# Patient Record
Sex: Female | Born: 1935 | Race: White | Hispanic: No | Smoking: Former smoker
Health system: Southern US, Community
[De-identification: ages and names within clinical notes are randomized; demographics above are authoritative.]

## PROBLEM LIST (undated history)

## (undated) DIAGNOSIS — H3530 Unspecified macular degeneration: Secondary | ICD-10-CM

## (undated) DIAGNOSIS — G459 Transient cerebral ischemic attack, unspecified: Secondary | ICD-10-CM

## (undated) DIAGNOSIS — I10 Essential (primary) hypertension: Secondary | ICD-10-CM

## (undated) DIAGNOSIS — I739 Peripheral vascular disease, unspecified: Secondary | ICD-10-CM

## (undated) DIAGNOSIS — J309 Allergic rhinitis, unspecified: Secondary | ICD-10-CM

## (undated) DIAGNOSIS — K8020 Calculus of gallbladder without cholecystitis without obstruction: Secondary | ICD-10-CM

## (undated) DIAGNOSIS — I779 Disorder of arteries and arterioles, unspecified: Secondary | ICD-10-CM

## (undated) DIAGNOSIS — Z720 Tobacco use: Secondary | ICD-10-CM

## (undated) DIAGNOSIS — E7800 Pure hypercholesterolemia, unspecified: Secondary | ICD-10-CM

## (undated) DIAGNOSIS — F329 Major depressive disorder, single episode, unspecified: Secondary | ICD-10-CM

## (undated) DIAGNOSIS — K5900 Constipation, unspecified: Secondary | ICD-10-CM

## (undated) DIAGNOSIS — N390 Urinary tract infection, site not specified: Secondary | ICD-10-CM

## (undated) DIAGNOSIS — K219 Gastro-esophageal reflux disease without esophagitis: Secondary | ICD-10-CM

## (undated) DIAGNOSIS — F32A Depression, unspecified: Secondary | ICD-10-CM

## (undated) DIAGNOSIS — I2510 Atherosclerotic heart disease of native coronary artery without angina pectoris: Secondary | ICD-10-CM

## (undated) DIAGNOSIS — D649 Anemia, unspecified: Secondary | ICD-10-CM

## (undated) DIAGNOSIS — N183 Chronic kidney disease, stage 3 unspecified: Secondary | ICD-10-CM

## (undated) DIAGNOSIS — E119 Type 2 diabetes mellitus without complications: Secondary | ICD-10-CM

## (undated) DIAGNOSIS — R51 Headache: Secondary | ICD-10-CM

## (undated) DIAGNOSIS — Z9289 Personal history of other medical treatment: Secondary | ICD-10-CM

## (undated) DIAGNOSIS — I509 Heart failure, unspecified: Secondary | ICD-10-CM

## (undated) DIAGNOSIS — G909 Disorder of the autonomic nervous system, unspecified: Secondary | ICD-10-CM

## (undated) DIAGNOSIS — G8929 Other chronic pain: Secondary | ICD-10-CM

## (undated) DIAGNOSIS — F419 Anxiety disorder, unspecified: Secondary | ICD-10-CM

## (undated) DIAGNOSIS — B952 Enterococcus as the cause of diseases classified elsewhere: Secondary | ICD-10-CM

## (undated) DIAGNOSIS — E039 Hypothyroidism, unspecified: Secondary | ICD-10-CM

## (undated) DIAGNOSIS — R279 Unspecified lack of coordination: Secondary | ICD-10-CM

## (undated) DIAGNOSIS — J449 Chronic obstructive pulmonary disease, unspecified: Secondary | ICD-10-CM

## (undated) DIAGNOSIS — N12 Tubulo-interstitial nephritis, not specified as acute or chronic: Secondary | ICD-10-CM

## (undated) DIAGNOSIS — E860 Dehydration: Secondary | ICD-10-CM

## (undated) DIAGNOSIS — E1142 Type 2 diabetes mellitus with diabetic polyneuropathy: Secondary | ICD-10-CM

## (undated) HISTORY — DX: Peripheral vascular disease, unspecified: I73.9

## (undated) HISTORY — DX: Chronic kidney disease, stage 3 unspecified: N18.30

## (undated) HISTORY — DX: Enterococcus as the cause of diseases classified elsewhere: B95.2

## (undated) HISTORY — DX: Transient cerebral ischemic attack, unspecified: G45.9

## (undated) HISTORY — PX: SHOULDER OPEN ROTATOR CUFF REPAIR: SHX2407

## (undated) HISTORY — DX: Calculus of gallbladder without cholecystitis without obstruction: K80.20

## (undated) HISTORY — DX: Enterococcus as the cause of diseases classified elsewhere: N39.0

## (undated) HISTORY — DX: Disorder of arteries and arterioles, unspecified: I77.9

## (undated) HISTORY — DX: Chronic kidney disease, stage 3 (moderate): N18.3

## (undated) HISTORY — PX: BYPASS GRAFT: SHX909

## (undated) HISTORY — PX: CATARACT EXTRACTION W/ INTRAOCULAR LENS  IMPLANT, BILATERAL: SHX1307

## (undated) HISTORY — DX: Tobacco use: Z72.0

## (undated) HISTORY — DX: Personal history of other medical treatment: Z92.89

## (undated) HISTORY — DX: Urinary tract infection, site not specified: N39.0

---
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
## 1969-01-14 HISTORY — PX: DILATION AND CURETTAGE OF UTERUS: SHX78

## 1969-01-14 HISTORY — PX: ABDOMINAL HYSTERECTOMY: SHX81

## 1999-03-18 ENCOUNTER — Encounter: Payer: Self-pay | Admitting: Vascular Surgery

## 1999-03-19 ENCOUNTER — Ambulatory Visit: Admission: RE | Admit: 1999-03-19 | Discharge: 1999-03-19 | Payer: Self-pay | Admitting: Vascular Surgery

## 1999-03-24 ENCOUNTER — Encounter: Payer: Self-pay | Admitting: Vascular Surgery

## 1999-03-24 ENCOUNTER — Ambulatory Visit (HOSPITAL_COMMUNITY): Admission: RE | Admit: 1999-03-24 | Discharge: 1999-03-24 | Payer: Self-pay | Admitting: Vascular Surgery

## 1999-05-14 ENCOUNTER — Ambulatory Visit (HOSPITAL_COMMUNITY): Admission: RE | Admit: 1999-05-14 | Discharge: 1999-05-14 | Payer: Self-pay | Admitting: Thoracic Surgery

## 1999-05-14 ENCOUNTER — Encounter: Payer: Self-pay | Admitting: Thoracic Surgery

## 1999-05-27 ENCOUNTER — Encounter: Admission: RE | Admit: 1999-05-27 | Discharge: 1999-08-25 | Payer: Self-pay | Admitting: Anesthesiology

## 1999-09-15 ENCOUNTER — Encounter: Payer: Self-pay | Admitting: Thoracic Surgery

## 1999-09-15 ENCOUNTER — Encounter: Admission: RE | Admit: 1999-09-15 | Discharge: 1999-09-15 | Payer: Self-pay | Admitting: Thoracic Surgery

## 2002-05-13 ENCOUNTER — Encounter: Admission: RE | Admit: 2002-05-13 | Discharge: 2002-08-11 | Payer: Self-pay | Admitting: Orthopaedic Surgery

## 2002-08-28 ENCOUNTER — Other Ambulatory Visit: Admission: RE | Admit: 2002-08-28 | Discharge: 2002-08-28 | Payer: Self-pay | Admitting: Family Medicine

## 2002-08-28 ENCOUNTER — Encounter: Admission: RE | Admit: 2002-08-28 | Discharge: 2002-08-28 | Payer: Self-pay | Admitting: Family Medicine

## 2002-08-28 ENCOUNTER — Encounter: Payer: Self-pay | Admitting: Family Medicine

## 2003-05-17 HISTORY — PX: CYSTOSTOMY W/ BLADDER BIOPSY: SHX1431

## 2003-09-18 ENCOUNTER — Encounter: Admission: RE | Admit: 2003-09-18 | Discharge: 2003-09-18 | Payer: Self-pay | Admitting: Urology

## 2003-09-19 ENCOUNTER — Encounter (INDEPENDENT_AMBULATORY_CARE_PROVIDER_SITE_OTHER): Payer: Self-pay | Admitting: Specialist

## 2003-09-19 ENCOUNTER — Ambulatory Visit (HOSPITAL_COMMUNITY): Admission: RE | Admit: 2003-09-19 | Discharge: 2003-09-19 | Payer: Self-pay | Admitting: Urology

## 2003-09-19 ENCOUNTER — Ambulatory Visit (HOSPITAL_BASED_OUTPATIENT_CLINIC_OR_DEPARTMENT_OTHER): Admission: RE | Admit: 2003-09-19 | Discharge: 2003-09-19 | Payer: Self-pay | Admitting: Urology

## 2005-05-18 ENCOUNTER — Emergency Department (HOSPITAL_COMMUNITY): Admission: EM | Admit: 2005-05-18 | Discharge: 2005-05-18 | Payer: Self-pay | Admitting: *Deleted

## 2005-11-07 ENCOUNTER — Ambulatory Visit (HOSPITAL_COMMUNITY): Admission: RE | Admit: 2005-11-07 | Discharge: 2005-11-07 | Payer: Self-pay | Admitting: Orthopaedic Surgery

## 2005-11-08 ENCOUNTER — Ambulatory Visit: Payer: Self-pay | Admitting: Cardiovascular Disease

## 2005-11-23 ENCOUNTER — Ambulatory Visit: Payer: Self-pay

## 2005-11-23 ENCOUNTER — Encounter: Payer: Self-pay | Admitting: Cardiovascular Disease

## 2005-11-29 ENCOUNTER — Ambulatory Visit (HOSPITAL_COMMUNITY): Admission: RE | Admit: 2005-11-29 | Discharge: 2005-11-29 | Payer: Self-pay | Admitting: Cardiology

## 2005-11-29 ENCOUNTER — Ambulatory Visit: Payer: Self-pay | Admitting: Cardiology

## 2005-11-29 HISTORY — PX: CARDIAC CATHETERIZATION: SHX172

## 2005-12-09 ENCOUNTER — Ambulatory Visit: Payer: Self-pay | Admitting: Cardiovascular Disease

## 2006-01-02 ENCOUNTER — Inpatient Hospital Stay (HOSPITAL_COMMUNITY): Admission: RE | Admit: 2006-01-02 | Discharge: 2006-01-03 | Payer: Self-pay | Admitting: Orthopaedic Surgery

## 2006-01-02 HISTORY — PX: ANTERIOR CERVICAL DECOMP/DISCECTOMY FUSION: SHX1161

## 2006-03-31 ENCOUNTER — Ambulatory Visit: Payer: Self-pay | Admitting: Cardiovascular Disease

## 2006-08-15 ENCOUNTER — Encounter: Admission: RE | Admit: 2006-08-15 | Discharge: 2006-08-15 | Payer: Self-pay | Admitting: Orthopaedic Surgery

## 2006-09-01 ENCOUNTER — Ambulatory Visit: Payer: Self-pay | Admitting: Vascular Surgery

## 2007-01-10 ENCOUNTER — Inpatient Hospital Stay (HOSPITAL_COMMUNITY): Admission: RE | Admit: 2007-01-10 | Discharge: 2007-01-17 | Payer: Self-pay | Admitting: Neurosurgery

## 2007-01-28 ENCOUNTER — Inpatient Hospital Stay (HOSPITAL_COMMUNITY): Admission: EM | Admit: 2007-01-28 | Discharge: 2007-02-05 | Payer: Self-pay | Admitting: Emergency Medicine

## 2007-02-01 ENCOUNTER — Encounter (INDEPENDENT_AMBULATORY_CARE_PROVIDER_SITE_OTHER): Payer: Self-pay | Admitting: Internal Medicine

## 2007-02-03 ENCOUNTER — Ambulatory Visit: Payer: Self-pay | Admitting: Surgery

## 2007-02-05 ENCOUNTER — Ambulatory Visit: Payer: Self-pay | Admitting: Physical Medicine & Rehabilitation

## 2007-04-04 ENCOUNTER — Ambulatory Visit: Payer: Self-pay | Admitting: Cardiovascular Disease

## 2007-04-04 LAB — CONVERTED CEMR LAB
BUN: 15 mg/dL (ref 6–23)
Calcium: 9.3 mg/dL (ref 8.4–10.5)
Chloride: 104 meq/L (ref 96–112)
GFR calc Af Amer: 63 mL/min

## 2007-06-11 ENCOUNTER — Encounter: Admission: RE | Admit: 2007-06-11 | Discharge: 2007-07-30 | Payer: Self-pay | Admitting: Family Medicine

## 2007-08-31 ENCOUNTER — Ambulatory Visit: Payer: Self-pay | Admitting: Vascular Surgery

## 2008-01-14 ENCOUNTER — Ambulatory Visit: Payer: Self-pay | Admitting: Cardiovascular Disease

## 2008-02-15 ENCOUNTER — Ambulatory Visit: Payer: Self-pay | Admitting: Vascular Surgery

## 2008-10-17 ENCOUNTER — Encounter (INDEPENDENT_AMBULATORY_CARE_PROVIDER_SITE_OTHER): Payer: Self-pay | Admitting: *Deleted

## 2008-12-01 DIAGNOSIS — E039 Hypothyroidism, unspecified: Secondary | ICD-10-CM | POA: Insufficient documentation

## 2008-12-01 DIAGNOSIS — R1310 Dysphagia, unspecified: Secondary | ICD-10-CM | POA: Insufficient documentation

## 2008-12-01 DIAGNOSIS — J449 Chronic obstructive pulmonary disease, unspecified: Secondary | ICD-10-CM | POA: Insufficient documentation

## 2008-12-01 DIAGNOSIS — I2510 Atherosclerotic heart disease of native coronary artery without angina pectoris: Secondary | ICD-10-CM | POA: Insufficient documentation

## 2008-12-01 DIAGNOSIS — E7800 Pure hypercholesterolemia, unspecified: Secondary | ICD-10-CM | POA: Insufficient documentation

## 2008-12-01 DIAGNOSIS — I10 Essential (primary) hypertension: Secondary | ICD-10-CM | POA: Insufficient documentation

## 2008-12-01 DIAGNOSIS — R0602 Shortness of breath: Secondary | ICD-10-CM

## 2008-12-01 DIAGNOSIS — E119 Type 2 diabetes mellitus without complications: Secondary | ICD-10-CM | POA: Insufficient documentation

## 2008-12-01 DIAGNOSIS — R5381 Other malaise: Secondary | ICD-10-CM | POA: Insufficient documentation

## 2008-12-01 DIAGNOSIS — D649 Anemia, unspecified: Secondary | ICD-10-CM | POA: Insufficient documentation

## 2008-12-01 DIAGNOSIS — R609 Edema, unspecified: Secondary | ICD-10-CM

## 2008-12-01 DIAGNOSIS — R5383 Other fatigue: Secondary | ICD-10-CM

## 2010-09-28 NOTE — Op Note (Signed)
[name], [name]                ACCOUNT NO.:  [id number]   MEDICAL RECORD NO.:  [id number]          PATIENT TYPE:  INP   LOCATION:  3035                         FACILITY:  [facility]   PHYSICIAN:  Cristi Loron, M.D.DATE OF BIRTH:  1934/08/16   DATE OF PROCEDURE:  01/10/2007  DATE OF DISCHARGE:                               OPERATIVE REPORT   BRIEF HISTORY:  The patient is a 76 year old white female who has had a  C5-6 and C6-7 anterior cervical diskectomy fusion plating performed many  months ago by another physician.  The patient developed a  pseudoarthrosis and persistent neck pain.  She south a second opinion  and I discussed various treatment options with the patient including  surgery.  The patient has weighed the risks, benefits and alternatives,  to surgery, decided to proceed with a posterior cervical instrumentation  and fusion C5-6 and C6-7.   PREOPERATIVE DIAGNOSIS:  C5-6 and C6-7 pseudoarthrosis, cervicalgia,  cervical radiculopathy.   POSTOPERATIVE DIAGNOSIS:  C5-6 and C6-7 pseudoarthrosis, cervicalgia,  cervical radiculopathy.   PROCEDURE:  C5-6 and C6-7 posterior cervical fusion with bone  morphogenic protein and Vitoss bone graft extender and local morselized  autograft bone; C5-C7 posterior cervical instrumentation with axis  titanium screws and rods.   SURGEON:  Dr. Delma Officer.   ASSISTANT:  Dr. Coletta Memos.   ANESTHESIA:  General endotracheal.   ESTIMATED BLOOD LOSS:  100 mL.   SPECIMENS:  None.   DRAINS:  None.   COMPLICATIONS:  None.   DESCRIPTION OF PROCEDURE:  The patient was brought to the operating room  by anesthesia team, general endotracheal anesthesia was induced.  I then  applied the [place] three point head rest to the patient's calvarium.  The patient was then carefully turned to prone position on chest rolls.  Her neck was somewhat flexed, exposing the suboccipital and posterior  cervical region.  This region was then shaved  with the clippers and  prepared with Betadine scrub and Betadine solution and sterile drapes  were applied.  I then injected the area to be incised with Marcaine with  epinephrine solution.  I then used a scalpel to make a linear midline  incision over the cervicothoracic junction.  I used electrocautery to  perform bilateral subperiosteal dissection, exposing the spinous process  and lamina of C5, 6, 7 and 1.  We then obtained intraoperative  radiograph to confirm our location.  We inserted the [facility]  retractor for exposure.  We exposed the facets at C5-6 and 6-7, using  electrocautery.  We then located the center of the lateral mass at C5,  6, and 7.  We used awl to create a hole in approximately the center of  the lateral mass.  Then we drilled 14 mm holes aiming cephalad and  lateral direction at C5, 6, and 7 bilaterally.  We then placed 14 mm  polyaxial titanium screws in the lateral masses bilaterally at C5, 6 and  7.  We contemplated using fluoroscopy but it became clear to [place] upon  using plain x-rays that there was no way the fluoroscopy could  be able  to give [place] any useful information and because of the patient's body  habitus.  We then obtained an intraoperative radiograph with limited  visualization.  There appeared to be good position on instrumentation.  It certainly looked good in vivo.  We then cut rods and bent them to  appropriate lordosis.  We placed the rod in the unilateral screws and  secured it in place with the caps.  We did this bilaterally.  We  tightened the caps appropriately.  This completed the instrumentation  from C5 to C7.   We now turned  our attention to the arthrodesis.  We used a high speed  drill to decorticate the remainder of the lateral masses and the lamina  at C5, 6, and 7 as well as to remove the synovial lining posterior  cervical lining of the facets of C5-6 and C6-7.  We then laid a  combination of local morcellized autograft bone we  obtained during the  drilling process as well as Vitoss bone graft extender and collagen  soaked bone morphogenic protein sponges over the posterolateral  structures.  This completed the posterolateral arthrodesis.  We then  obtained hemostasis with bipolar cautery.  We removed the retractor and  then reapproximated the patient's cervical fascia with interrupted #1  Vicryl sutures, subcutaneous tissue with interrupted 2-0 Vicryl suture  and the skin with Steri-Strips and benzoin.  The wound was then coated  with bacitracin ointment, a sterile dressing was applied, the drapes  were removed.  The patient was subsequently returned to supine position,  where we removed the [place] three-point head rest from the calvarium.  She was subsequently extubated and transported to the post anesthesia  care unit in stable condition.  All sponge instrument and needle counts  correct at end this case.      Cristi Loron, M.D.  Electronically Signed     JDJ/MEDQ  D:  01/10/2007  T:  01/11/2007  Job:  [id number]

## 2010-09-28 NOTE — Discharge Summary (Signed)
[name], [name]                ACCOUNT NO.:  [id number]   MEDICAL RECORD NO.:  [id number]          PATIENT TYPE:  INP   LOCATION:  3035                         FACILITY:  [facility]   PHYSICIAN:  Cristi Loron, M.D.DATE OF BIRTH:  03/17/1935   DATE OF ADMISSION:  01/10/2007  DATE OF DISCHARGE:  01/17/2007                               DISCHARGE SUMMARY   BRIEF HISTORY:  The patient is a 76 year old white female who has had a  C5-6 and C6-7 anterior cervical discectomy, fusion, and plating in the  past by another physician.  The patient developed a pseudoarthrosis and  persistent neck pain.  She requested a second opinion.  I saw the  patient and discussed various treatment options with her including  surgery.  The patient is aware of the risks, benefits, and alternatives  to the surgery and decided to proceed with a posterior cervical  instrumentation and fusion at C5-6 and C6-7.   For further details of this admission, please refer to the history and  physical.   HOSPITAL COURSE:  Admitted the patient to [facility] on January 10, 2007.  On the day of admission, I performed a C5-6 and C6-7  posterior cervical fusion and instrumentation.  The surgery went well.  For full details of this operation, please refer to operative note.   POSTOPERATIVE COURSE:  The patient's postoperative course was as  follows, the patient did develop some urinary retention, which resolved.  The patient was somewhat slow to mobilize.  We had PT and OT see the  patient.  We also asked PMNR to see the patient.  They did not feel that  she was appropriate for inpatient rehabilitation.  There was some  discussion back and forth from the patient and her family whether or not  she need to go to a super skilled nursing facility.  I discussed this on  several occasions with the patient and she declined it and said she want  to go home.  By January 23, 2007, the patient was afebrile.  Her vital  signs were stable.  She was eating well and ambulating well, and  therefore, we discharged her home.   DISCHARGE INSTRUCTIONS:  This patient was given written discharge  instructions.  Instructed to follow up with me in 4 weeks.   DISCHARGE PRESCRIPTIONS:  1. Percocet 10/325 mg, number 50, one p.o. q.4h. p.r.n. for pain.  2. Valium 5 mg, number 50, one p.o. q.6h. p.r.n. muscle spasm.   FINAL DIAGNOSES:  1. C5-C and C6-7 pseudoarthrosis.  2. Cervicalgia.  3. Cervical  radiculopathy.   PROCEDURE PERFORMED:  C5-6 and C6-7 posterior cervical fusion with bone  morphogenetic protein and VITOSS bone center as well as local morselized  autograft bone; C5-C7 posterior cervical instrumentation with several  titanium pedicle screws and wires.      Cristi Loron, M.D.  Electronically Signed     JDJ/MEDQ  D:  02/28/2007  T:  03/01/2007  Job:  [id number]

## 2010-09-28 NOTE — Procedures (Signed)
BYPASS GRAFT EVALUATION   INDICATION:  Followup evaluation of right BPG.   HISTORY:  Diabetes:  No.  Cardiac:  CHF.  Hypertension:  Yes.  Smoking:  Yes, 4-5 cigarettes a day.  Previous Surgery:  Right fem-AK pop BPG with translocated saphenous vein  on 08/16/1995 and left iliac PTA and stent on 08/20/1992.   SINGLE LEVEL ARTERIAL EXAM                               RIGHT              LEFT  Brachial:                    172                170  Anterior tibial:             154                110  Posterior tibial:            160                104  Peroneal:  Ankle/brachial index:        0.93               0.65   PREVIOUS ABI:  Date: 09/01/2006  RIGHT:  0.96  LEFT:  0.69   LOWER EXTREMITY BYPASS GRAFT DUPLEX EXAM:   DUPLEX:  1. Patent CFA with elevated velocity noted.  2. Patent fem-pop BPG with velocities ranging from 77 cm/s to 130 cm/s      without evidence of stenosis.  3. Patent distal native artery.   IMPRESSION:  1. Essentially stable ABIs bilaterally.  2. Patent CFA with elevated velocity noted.  3. Patent fem-pop without evidence of stenosis.  4. Patent distal native artery.     ___________________________________________  Larina Earthly, M.D.   PB/MEDQ  D:  08/31/2007  T:  08/31/2007  Job:  [id number]

## 2010-09-28 NOTE — Discharge Summary (Signed)
[name], [name]                ACCOUNT NO.:  [id number]   MEDICAL RECORD NO.:  [id number]          PATIENT TYPE:  INP   LOCATION:  3743                         FACILITY:  [facility]   PHYSICIAN:  Michiel Cowboy, MDDATE OF BIRTH:  May 12, 1935   DATE OF ADMISSION:  01/28/2007  DATE OF DISCHARGE:  02/05/2007                               DISCHARGE SUMMARY   DISCHARGE DIAGNOSES:  1. Chronic obstructive pulmonary disease.  2. Pulmonary deconditioning with new O2 requirement up to 2 liters.  3. Coronary artery disease, medical management.  4. Hypertension.  5. Diabetes.  6. Hypothyroidism.  7. C5-6 and C6-7 diskectomy with pseudarthrosis status post separation      in August 2008.  8. Anemia.   </HOSPITAL COURSE  The patient is a 76 year old female with past medical history of recent  cervical spine surgery which was performed by Dr. Lovell Sheehan on January 10, 2007 as well as history of coronary artery disease, diabetes and  hypertension.  The patient presented on January 28, 2007, with  complaints of generalized fatigue and shortness of breath.  She also  reports that she was having trouble swallowing after her operation and  had some decrease in p.o. intake.  On the day of admission, the patient  woke up with shortness of breath.  Prior to this, she had been having  some nonproductive cough.  Of note, the patient has not been diagnosed  with COPD prior to this, but has been smoking all her life up to the day  of admission.   At the time of admission, the patient was evaluated, ruled out for MI  when placed on telemetry.  Two cardiac enzymes were obtained and were  negative.  The patient had undergone a 2D echo that showed EF of 55-60%  with no left ventricular regional wall abnormalities and no diastolic  dysfunction.  The patient also was evaluated for potential PE with CT  angiogram as well as lower extremity Dopplers, neither one of them  showing a clot.  Chest x-ray was  negative for pneumonia, but did show a  mild congestive heart failure with very small bilateral pleural  effusions.  The patient was noted to be somewhat deconditioned to oxygen  requirement as remained to be around 2 liters per hospital stay.  At  this point, the patient was also evaluated for COPD with PFTs that  showed obstructive picture.  The patient was started on  Albuterol/Atrovent nebulizers as well as Advair.  Thereafter, her  breathing has improved and her shortness of breath has markedly  improved.  At the time of discharge, the patient remains on 2 liters of  oxygen, but is breathing comfortably.  No wheezing.   The patient has history of coronary artery disease status post  catheterization 2007 showing diffuse coronary artery disease.  At this  point, she is on medical management including beta blocker, aspirin,  Statin and also had an ACE inhibitor, Lisinopril 5 mg p.o. once daily.  Of note, the patient would need her labs checked in about one week to  monitor her creatinine and  her potassium.   Hypertension, while in-house, will continue her home medications.  Will  also add Lisinopril.  See above.   Diabetes, while in-house, initially her NPH was held.  Per the patient,  she was taking 15 units of NPH twice a day.  We switched the patient  onto Lantus and titrated it up.  On discharge, we will discharge her on  25 units of Lantus once daily.   Pseudarthrosis of the cervical spine.  The patient is status post  separation on December 19, 2006, by Dr. Lovell Sheehan.  He performed the  separation.  Will continue Elavil and Diazepam as needed for pain.  The  patient has a followup with Dr. Lovell Sheehan next week.   Anemia.  During this hospitalization, it was noted the patient was  anemic.  Her NCD was in high 80s and hemoccult stool was negative.  This  is likely anemia of chronic disease, but would recommend further workup  and follow up as an outpatient.  Her H&H remained stable.   She does not  require transfusions and was completely hemodynamically stable while in-  house.   Hypothyroidism.  I will continue the patient Synthroid while in-house.   Of note, it was noted that the patient had minimal urinary protein while  she was in-house.  This is likely secondary to diabetes.  Will start the  patient on low dose Lisinopril 5 mg p.o. daily.   DISCHARGE INSTRUCTIONS:  The patient to have follow up with Dr. Donia Guiles in 1-2 weeks.  Patient to schedule that appointment.  During  this appointment, I would recommend rechecking her chemistry panel.   The patient to have follow up by Dr. Tressie Stalker phone number [phone], appointment is on September 26 at 8:20 a.m.  The patient is to be  discharged to [facility] [place].  Patient to have daily PT/OT  as tolerated.   DISCHARGE MEDICATIONS:  1. Coreg 40 mg p.o. daily.  2. Norvasc 10 mg p.o. daily.  3. Crestor 20 mg p.o. daily.  4. Synthroid 50 mcg p.o. daily.  5. Elavil 25 mg p.o. at bedtime.  6. Prilosec 25 mg p.o. daily.  7. Aspirin 81 mg p.o. daily.  8. Lasix 20 mg p.o. daily.  9. Colace 100 mg p.o. b.i.d.  10.Dulcolax 10 mg suppositories p.r. every other day.  11.MiraLax 17 g daily p.r.n. constipation.  12.Advair 100/50 mg one puff b.i.d.  Wash mouth out with water after      applying.  13.Lantus 45 units subcu q.a.m.  14.Darvocet 100/150 mg p.o. q.6 h p.r.n. pain.  15.Diazepam 5 mg p.o. b.i.d. p.r.n. muscle spasm.  16.Lisinopril 5 mg p.o. daily.  17.Atrovent two puffs t.i.d.  18.Albuterol one puff q.4 h p.r.n. wheezing as well as Atrovent two      puffs q.4 h p.r.n. wheezing.  The patient needs to have her sugars      checked prior to meals and before bedtime.  19.Patient to have sliding scale insulin with NovoLog.   PHYSICAL EXAMINATION:  GENERAL:  On the day of discharge, the patient  appeared well.  LUNGS:  Clear.  No wheezing noted.  HEART:  Regular rate and rhythm.  No  murmurs, rubs or gallops.  EXTREMITIES:  Without edema.   LABORATORY DATA:  H&H 10.2/31.3, potassium 4.8, creatinine 0.9, albumin  2.4.      Michiel Cowboy, MD  Electronically Signed     AVD/MEDQ  D:  02/05/2007  T:  02/05/2007  Job:  [id number]   cc:   Donia Guiles, M.D.

## 2010-09-28 NOTE — Procedures (Signed)
BYPASS GRAFT EVALUATION   INDICATION:  Follow up right lower extremity bypass graft.   HISTORY:  Diabetes:  No.  Cardiac:  CHF.  Hypertension:  Yes.  Smoking:  Yes.  Previous Surgery:  Right fem-pop bypass graft on 08/16/95.  Left iliac  PTA and stent on 08/20/92.   SINGLE LEVEL ARTERIAL EXAM                               RIGHT              LEFT  Brachial:                    193                187  Anterior tibial:             183                156  Posterior tibial:            180                159  Peroneal:  Ankle/brachial index:        0.95               0.82   PREVIOUS ABI:  Date: 08/31/07  RIGHT:  0.93  LEFT:  0.65   LOWER EXTREMITY BYPASS GRAFT DUPLEX EXAM:   DUPLEX:  Biphasic Doppler waveforms noted throughout the right lower  extremity bypass graft in its native vessels with an elevated velocity  of 230 cm/s noted in the common femoral artery.   IMPRESSION:  1. Patent right femoropopliteal bypass graft with an elevated velocity      noted, as described above.  2. Stable bilateral ankle brachial indices noted.  3. No significant change noted when compared to the previous      examination of 08/31/07.       ___________________________________________  Larina Earthly, M.D.   CH/MEDQ  D:  02/15/2008  T:  02/16/2008  Job:  [id number]

## 2010-09-28 NOTE — H&P (Signed)
[name], [name]                ACCOUNT NO.:  [id number]   MEDICAL RECORD NO.:  [id number]          PATIENT TYPE:  INP   LOCATION:  3743                         FACILITY:  [facility]   PHYSICIAN:  Kela Millin, M.D.DATE OF BIRTH:  05/05/1935   DATE OF ADMISSION:  01/28/2007  DATE OF DISCHARGE:                              HISTORY & PHYSICAL   PRIMARY CARE PHYSICIAN:  Dr. Donia Guiles.   CHIEF COMPLAINT:  Generalized weakness and shortness of breath.   HISTORY OF PRESENT ILLNESS:  The patient is a 76 year old white female  with a past medical history significant for recent cervical spine  surgery on January 10, 2007, per Dr. Lovell Sheehan, also a history of coronary  artery disease, diabetes, hypertension and hypothyroidism, who presents  with the above complaints.  She states that since her surgery she has  been having difficulty swallowing and as a result has had decreased p.o.  intake.  Today when she woke up she was having difficulty breathing as  well as generalized weakness and so she called EMS and was brought to  the ER.  She admits to a nonproductive cough for the past few days.  She  also states that she has been having about two diarrheal stools per day  and it is noted that she has been on Keflex.  She denies fevers,  dysuria, PND, melena and no hematochezia.  She admits to nausea and  intermittent vomiting since her surgery.   The patient was seen in the ER and her BUN 28, creatinine of 1.39,  urinalysis negative for infection, specific gravity 1.023, occult blood  negative, her point of care markers were negative.  She also was found  to have a blood glucose of 55 in the ER.  She is admitted for further  evaluation and management.   PAST MEDICAL HISTORY:  As above.   MEDICATIONS:  1. Coreg CR 40 mg daily.  2. Actos 45 mg daily.  3. Premarin 0.625 mg daily.  4. Lasix 20 mg p.r.n.  5. Norvasc 10 mg daily.  6. Crestor 20 mg daily.  7. Amitriptyline 25 mg q.h.s.  8.  Levothyroxine 50 mcg.  9. Glucovance 5/500 b.i.d.  10.Keflex 500 q.6h.  11.Prilosec 20 mg daily.  12.Darvocet q.6h. p.r.n.  13.Xanax 0.25 mg p.r.n.  14.Diazepam 5 mg q.8h. p.r.n.  15.Percocet p.r.n.   ALLERGIES:  SULFA.   SOCIAL HISTORY:  1. Positive for tobacco.  2. She denies alcohol.   FAMILY HISTORY:  1. Her Dad had a MI at age 39.  2. Her brother had bone cancer, also had an MI in his 35's.   REVIEW OF SYSTEMS:  As per HPI, other review of systems negative.   PHYSICAL EXAM:  GENERAL:  The patient is an elderly white female, in no  respiratory distress.  She has a neck collar on.  VITAL SIGNS:  Her temperature is 97.8, blood pressure of 150/59, pulse  of 60, respiratory rate of 20, O2 sat of 97%.  HEENT:  PERRL, EOMI, dry mucous membranes, no oral exudates.  NECK:  Neck collar present.  LUNGS:  Decreased breath sound in the bases, no wheezes, no crackles.  CARDIOVASCULAR:  Regular rate and rhythm, normal S1 S2.  ABDOMEN:  Soft, bowel sounds present, nontender, nondistended, no  organomegaly and no masses palpable.  EXTREMITIES:  Trace edema, no cyanosis.   LABORATORY DATA:  Point of care markers negative x1.  Urinalysis:  Specific gravity is 1.023, it is cloudy, total protein is greater than  300, otherwise negative for infection.  White cell count is 8.2 with a  hemoglobin of 10.5, hematocrit of 31.8, platelet count is 280,  neutrophil count 69%.  Occult blood is negative.  Sodium is 143 with a  potassium of 5.2, chloride of 110, CO2 of 22, glucose of 55, BUN 28,  creatinine 1.39.  Calcium 8.8, total protein is 6.2.   ASSESSMENT AND PLAN:  1. Shortness of breath - As discussed above, noted patient is status      post cervical spine surgery on August 27 and she is a smoker.  Will      obtain a chest x-ray to evaluate for infiltrates and also a D-dimer      to evaluate for emboli.  Will obtain serial cardiac enzymes as      well.  Follow the studies and further manage  as appropriate.  2. Dysphagia - Status post cervical surgery, will consult Speech      Therapy for a swallowing evaluation.  Will start on a dysphagia      diet for now and follow.  3. Azotemia/volume depletion - As discussed above, decreased oral      intake secondary to number 2.  Hydrate, hold Lasix for now, follow.  4. Weakness - Likely secondary to above.  Will obtain a TSH level,      follow cardiac enzymes and hydrate.  5. Coronary artery disease - Cardiac enzymes as above.  Continue      outpatient medications.  6. Diabetes mellitus with hypoglycemia - Decreased p.o. intake since      her surgery as noted above and still taking her oral hypoglycemics.      Will monitor Accu-Cheks, hold off oral hypoglycemics for now and      follow.  7. Hypertension - Continue outpatient medications.  8. Question diarrhea/loose stools - Likely antibiotic associated, will      check stool studies and Lactinex.  9. Anemia - Guaiac negative, follow and recheck hemoglobin.  10.Status post cervical surgery - On Keflex as above, follow and      notify Dr. Lovell Sheehan of patient's admission.      Kela Millin, M.D.  Electronically Signed     ACV/MEDQ  D:  01/28/2007  T:  01/28/2007  Job:  [id number]   cc:   Donia Guiles, M.D.

## 2010-09-28 NOTE — Assessment & Plan Note (Signed)
[facility]                            CARDIOLOGY OFFICE NOTE   NAME:[name], [name]                       MRN:          [id number]  DATE:01/14/2008                            DOB:          Feb 01, 1935    [name] returns today for followup.  She has moderate left circ disease  by cath, I believe, in 2007.   Unfortunately, she continues to smoke.  She has not tried to quit in  over 2 years.   She fortunately is not having any significant chest pain.  Her activity  is limited by some COPD.   Coronary risk factors include hypercholesterolemia, hypertension, and  diabetes.   REVIEW OF SYSTEMS:  Remarkable for the patient does not have  nitroglycerin.  We need to call this in.  She has been having increasing  lower extremity edema.  She is already on Lasix twice a day.  She  sometimes takes the third dose.  I told her for the time being we would  stop amlodipine since the calcium blockers can exacerbate lower  extremity edema and increase her lisinopril to 40 mg and add  hydrochlorothiazide.  She will continue to try to have a low-salt diet.   Her review of systems otherwise negative.   MEDICATIONS:  1. Lantus as directed.  2. Aspirin a day.  3. Simvastatin 80 daily.  4. Levothyroxine 50 a day.  5. Elavil 25 a day.  6. Lasix 20 b.i.d.  7. Coreg 40 a day.  8. Crestor 20 a day.  9. Lisinopril 40/25 to be added.  10.Amlodipine 10 mg a day to be stopped.  11.Premarin 0.625 a day.   PHYSICAL EXAMINATION:  GENERAL:  Remarkable for bronchitic female in no  distress.  VITAL SIGNS:  Blood pressure 158/73, pulse 70 and regular, respiratory  rate 14, afebrile, and weight 190.  HEENT:  Unremarkable.  NECK:  Carotids are normal without bruit.  No lymphadenopathy,  thyromegaly, or JVP elevation.  LUNGS:  Clear.  Good diaphragmatic motion.  No wheezing.  CARDIAC:  S1 and S2 with normal heart sounds.  PMI normal.  ABDOMEN:  Benign.  Bowel sounds positive.   No AAA, no tenderness, no  bruit.  No hepatosplenomegaly or hepatojugular reflux.  No tenderness.  EXTREMITIES:  Distal pulses are intact with +1 edema bilaterally.  NEURO:  Nonfocal.  SKIN:  Warm and dry.  MUSCULOSKELETAL:  No muscular weakness.   EKG shows sinus rhythm with right bundle-branch block and left axis  deviation with previous inferolateral wall infarct.   IMPRESSION:  1. 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  Currently, not having chest pain.  Continue aspirin.      Beta blocker is being avoided due to chronic obstructive pulmonary      disease.  2. Hypertension, currently borderline controlled.  Stop amlodipine,      increase ACE inhibitor.  3. Lower extremity edema.  Continue low-salt diet and Lasix.      Hydrochlorothiazide to be added.  Discontinue calcium-channel      blocker.  4.  Smoking cessation.  I talked with the patient at length about this.      I explained to her that her chronic obstructive pulmonary disease      is worsening and that she will have a heart attack in the future if      she does not stop.  I gave her the option of starting Chantix or      Wellbutrin.  She declined at this time.  We will follow up with      this in our next visit.  She is not a good candidate for nicotine      patches given her known disease.  5. Hypothyroidism.  Continue Synthroid replacement.  TSH and T4 in 3      months.  6. Diabetes.  Hemoglobin A1c quarterly.  Continue current dose of      Lantus.   Prescriptions will be called in to [facility].     Noralyn Pick. Eden Emms, MD, [facility]  Electronically Signed    PCN/MedQ  DD: 01/14/2008  DT: 01/15/2008  Job #: [id number]

## 2010-09-28 NOTE — Assessment & Plan Note (Signed)
[facility]                            CARDIOLOGY OFFICE NOTE   NAME:Wilcox, [name]                       MRN:          [id number]  DATE:04/04/2007                            DOB:          May 17, 1934    She is a patient that has coronary artery disease, which is being  managed medically.   She has a 70-75% bifurcation circumflex lesion with small vessels.  She  has successfully gone through cervical spine surgery at the end of 2007,  without any sequelae.  Unfortunately, she recently had a repeat surgery  by Dr. Lovell Sheehan, which was complicated by bladder retention, diabetes,  and renal failure.  She actually spent quite a few days at  rehabilitation.  She continues to have problems with her legs and  generalized weakness.  While she was hospitalized there was some volume  overload, which was easily taken care of with Lasix.  The patient has  normal LV function.  She did not have any chest pain and there was no  evidence of an acute coronary syndrome.  In talking to the patient, she  is still somewhat upset about things.  Her cervical spine is still not  totally stable.  She has generalized weakness.  Her legs have a numb and  wobbly feeling to them.  She was asking if there was any kind of  rehabilitation that she can go through on a regular basis outside of the  house.  I told her to talk to Dr. Lovell Sheehan about this.   From a cardiac perspective she has been stable.  She has significant  COPD and has some chronic exertional dyspnea.  She is currently walking  with a walker after her cervical spine surgery.   The patient's review of systems is otherwise negative.   MEDICATIONS:  1. She is on Lantus as directed.  2. Aspirin daily.  3. Lasix 20 mg daily.  4. Simvastatin 80 mg daily.  5. Levothyroxin 50 mg daily.  6. Elavil 25 mg daily.  7. Advair.   PHYSICAL EXAMINATION:  Remarkable for a chronically ill-appearing white  female in no distress.   She has a walker.  Weight 196, pulse 80 and  regular, afebrile, respiratory rate 14.  HEENT:  Unremarkable.  NECK:  Carotids have bilateral bruits.  LUNGS:  Occasional coarse wheeze, with decreased air sounds.  Diaphragmatic motion is normal.  CARDIAC:  There is no S1, S2; systolic ejection murmur.  PMI is normal.  ABDOMEN:  Benign.  She is status post vascular right lower extremity  surgery.  She has plus-1 PT on the right and plus-2 PT on the left.  NEUROLOGIC:  Nonfocal.  She has both anterior and posterior cervical  neck scars.   IMPRESSION:  1. CORONARY DISEASE.  Primarily bifurcation circumflex disease.      Continue current medical therapy, including aspirin.  She is not on      a beta blocker due to her chronic obstructive pulmonary disease.      We may have to reassess this, as I believe she has tolerated them  perioperatively.  2. HYPERCHOLESTEROLEMIA.  In the setting of coronary artery disease.      Continue simvastatin 80 mg daily.  Lipid and liver profile in 6      months.  3. HYPOTHYROIDISM.  Continue Synthroid 60 mcg daily.  TSH and T4 in 6      months.  4. SIGNIFICANT CHRONIC OBSTRUCTIVE PULMONARY DISEASE.  Continue      Advair.  A flu shot was given this year.  To follow up with      pulmonary doctors; PFTs are not documented in the chart.  She does      have some calcific granulomatous disease as well, which will need      follow-up.  5. DIABETES.  The patient is on insulin.  6. She is not on an ACE inhibitor.  I think she would probably benefit      from this.  We will call in a prescription for her to take 10 mg      lisinopril.  I believe her pharmacy is [facility].  Given      her previous urinary retention and such, it may be worthwhile to      check a BMET and BNP at a baseline level before we start this.   Overall, I think the patient's cardiac status is stable.  She still  needs further rehabilitation from her cervical spine surgery.  I will   see her back in 6 months.     Noralyn Pick. Eden Emms, MD, [facility]  Electronically Signed    PCN/MedQ  DD: 04/04/2007  DT: 04/05/2007  Job #: [id number]

## 2010-10-01 NOTE — Cardiovascular Report (Signed)
[name], [name]                ACCOUNT NO.:  [id number]   MEDICAL RECORD NO.:  [id number]          PATIENT TYPE:  OIB   LOCATION:  2899                         FACILITY:  [facility]   PHYSICIAN:  Jonelle Sidle, M.D. LHCDATE OF BIRTH:  06/12/1934   DATE OF PROCEDURE:  DATE OF DISCHARGE:                              CARDIAC CATHETERIZATION   REQUESTING CARDIOLOGIST:  Charlton Haws, M.D.   INDICATIONS:  Ms. Fullard is a 76 year old woman with a longstanding history  of type 2 diabetes mellitus, peripheral arterial disease, status post right  lower extremity bypass grafting by Dr. Arbie Cookey approximately 10 years ago, and  ongoing tobacco use.  She was recently evaluated for a preoperative  assessment prior to planned cervical disk surgery.  She was referred for a  myocardial perfusion study which was abnormal indicating moderate  inferolateral scar with anteroapical ischemia and ejection fraction of 62%.  Echocardiography also suggest normal ventricular function.  She is referred  now for diagnostic coronary angiography to assess for any potential  revascularizations strategies.  Of note, she is not describing any  significant angina at this point.  The potential risk and benefits were  explained her in advance and informed consent was obtained.   PROCEDURES PERFORMED:  1.  Left heart catheterization  2.  Selective coronary angiography.  3.  Left ventriculography.   ACCESS AND EQUIPMENT:  The area about the right femoral artery was  anesthetized with 1% lidocaine and a 6-French sheath was placed in the right  femoral artery via the modified Seldinger technique following a single  anterior wall puncture.  Standard 6-French JL-4 and JR-4 catheters were used  for selective coronary angiography and an angled pigtail catheter was used  for left heart catheterization and left ventriculography.  All exchanges  were made over a wire.  A total 100 mL Omnipaque were used.  There are no  immediate complications and the patient tolerated the procedure well.   HEMODYNAMIC RESULTS:  Aorta 159/55.  Left ventricle 159/50.   ANGIOGRAPHIC FINDINGS:  1.  Left main coronary artery is free of significant flow-limiting coronary      atherosclerosis and gives rise to left anterior descending and the      circumflex coronary arteries.  There is mild calcification of this      vessel as well as the proximal left anterior descending and circumflex      coronary arteries.  2.  The left anterior descending is a small to medium caliber vessel with      two proximal diagonal branches.  The vessel is mildly diffusely diseased      at approximately 30% although there are no flow-limiting stenoses noted.  3.  The circumflex coronary artery is a small to medium caliber vessel.      There is approximately 20% stenosis in the proximal segment.  In the mid      to distal circumflex is a 75% stenosis prior to a branch point with      obtuse marginal.  In the distal circumflex and obtuse marginal, there      are  75% stenoses noted as well.  These vessels including the mid to      distal circumflex are less than 3 mm vessels.  4.  The right coronary artery is a small, diffusely diseased vessel that is      dominant.  There is approximately 40% stenosis in the proximal segment,      although the entire vessel is diffusely diseased to mild degree, likely      30-40%.  There are no obvious flow-limiting stenoses noted.   Left ventriculography was performed in the RAO projection and reveals an  ejection fraction of approximately 55%.  There is hypokinesis noted of the  mid posterior wall and no significant mitral vegetation is noted.   DIAGNOSES:  1.  Coronary disease as outlined.  The patient has relatively small caliber      coronary vessels likely due to her longstanding diabetes mellitus.      There are 75% stenoses in the mid to distal circumflex/obtuse marginal      system, although these  vessels are relatively small in caliber.  The      right coronary artery is also small in caliber and diffusely diseased      although not obstructive in nature.  2.  Left ventricular ejection fraction approximately 55% with mid posterior      hypokinesis.  No significant mitral regurgitation and left ventricular      end-diastolic pressure of 15 mmHg.   DISCUSSION:  I reviewed the results with the patient and her family and also  went over the films with Dr. Eden Emms.  At this point, plan will be for  maximizing medical therapy for her coronary disease, particularly as she is  not describing any anginal symptoms and given the relatively small caliber  of her coronary vessels.  Percutaneous revascularization could be considered  although she would have an increased risk of restenosis.  Plan will be to  have her follow up with Dr. Eden Emms in the office and finalize plans for  surgery from there.      Jonelle Sidle, M.D. [facility]  Electronically Signed     SGM/MEDQ  D:  11/29/2005  T:  11/29/2005  Job:  [id number]   cc:   Charlton Haws, M.D.  [address]  [address]  [place]  [place] [address]

## 2010-10-01 NOTE — Op Note (Signed)
[name], [name]                          ACCOUNT NO.:  [id number]   MEDICAL RECORD NO.:  [id number]                   PATIENT TYPE:  AMB   LOCATION:  [facility]                                 FACILITY:  [facility]   PHYSICIAN:  Mark C. Vernie Ammons, M.D.               DATE OF BIRTH:  04/03/1935   DATE OF PROCEDURE:  09/19/2003  DATE OF DISCHARGE:                                 OPERATIVE REPORT   PREOPERATIVE DIAGNOSIS:  Chronic cystitis and irritative voiding symptoms.   POSTOPERATIVE DIAGNOSIS:  Chronic cystitis and irritative voiding symptoms.   OPERATION/PROCEDURE:  1. Cystoscopy.  2. Examination under anesthesia.  3. Hydrodistention.  4. Random bladder biopsies.   SURGEON:  Mark C. Vernie Ammons, M.D.   ANESTHESIA:  General LMA.   ESTIMATED BLOOD LOSS:  Less than 1 mL.   SPECIMENS:  1. Catheterized urine for culture and sensitivity.  2. Random bladder biopsies to pathology.  3. Random bladder biopsies for fungal and bacterial culture and     sensitivities.   DRAINS:  None.   COMPLICATIONS:  None.   INDICATIONS:  The patient is a 76 year old white female who has had  difficulty with irritative voiding symptoms, primarily consisting of  frequency, urgency and nocturia for some time.  Her urine in my office  always appears infected with 2+ bacteria and moderate yeast.  However, she  has not grown out either of these types of organisms on cultures.  Her  catheterized specimens appear similar.  She has been tried empirically on  antibiotics and antifungals without improvement.  She was brought to the OR  today for further evaluation and to rule out occult carcinoma in situ as  well as possible interstitial cystitis.   DESCRIPTION OF PROCEDURE:  After informed consent, the patient was brought  to the major OR and placed on the table, administered general anesthesia,  and moved to the dorsal lithotomy position.  Her genitalia was sterilely  prepped and draped.  Examination reveals a  normal-appearing urethra.  There  are no paraurethral masses or tenderness.  No lesions or bladder-based  abnormality, thickening or induration.   Rigid cystoscopy was then performed using the 22-French cystoscopy with 12-  degree and 70-degree lenses.  The bladder was fully inspected and really  appeared quite normal with normal-appearing pink mucosa.  There were no  significant inflammatory lesions, irritation, papillary tumors, or other  abnormalities of the mucosa.  The ureteral orifices were normal in  configuration and position.  There was some slight squamous trigonal  metaplasia.   Hydrodistention was then performed at 80 cmH2O pressure.  Bladder held  approximately 950 mL.  Upon draining, the terminal effluent was bloody.  Reinspection revealed multiple glomerulations and petechial hemorrhages.   The cold cup biopsy forceps were then used to obtain biopsies of the bladder  wall mucosa primarily from areas that have the greater amount of petechial  hemorrhages.  Two specimens were sent sterilely for separate fungal culture  and bacterial culture and sensitivity.  I then obtained random bladder  biopsies for pathologic evaluation from the areas of greatest petechial  hemorrhages and also one from the area of the trigone.  These areas were  then fulgurated with the Bugbee electrode.  No bleeding was noted at the end  of the case.  The bladder was intact.  I, therefore, drained the bladder and  the patient was awakened and taken to the recovery room in stable and  satisfactory condition.  She tolerated the procedure well and there were no  intraoperative complications.   It appears that if all cultures are negative, that this may be a form of  interstitial cystitis although she had an excellent bladder capacity.  I  will consider intravesical DMSO for its anti-inflammatory properties upon  followup.  She did receive 50 mg of Levaquin p.o. prior to today's surgery.  She will be  given a prescription for Pyridium Plus and Vicodin ES for pain  and follow in one week.                                               Mark C. Vernie Ammons, M.D.    MCO/MEDQ  D:  09/19/2003  T:  09/19/2003  Job:  [id number]

## 2010-10-01 NOTE — Assessment & Plan Note (Signed)
[facility]                              CARDIOLOGY OFFICE NOTE   NAME:[place], [name]                       MRN:          [id number]  DATE:03/31/2006                            DOB:          [birth year]    [name] returns today for followup.  She was initially sent for  preoperative clearance for her neck.  Catheterization showed 70-75%  bifurcation, circ disease, in a small diabetic vessel.  She was treated  medically.  She has done well.  She had her surgery without sequela.  She  continues to have some neck pain.  From a cardiac perspective she is stable.  She is not having any PND, orthopnea, palpitations, or chest pain.   Her insulin was recently changed by her primary care doctor.  I am not sure  how well her hemoglobin.  Her sugars are being controlled.  I think that she  had a postprandial sugar close to 160 recently.   From a cardiac perspective we had changed her to Coreg for beta blocker  therapy around the time of her operation.  Blood pressure seems to be  running generally well, to on the high side and we will increase her Coreg  CR to 40 mg a day.   PHYSICAL EXAMINATION:  On exam she looks well.  The blood pressure in the office, today, was mildly elevated at 150/70,  pulse 74 and regular.  HEENT:  Normal.  She has a scar in her neck which is healing.  Carotids are normal, there is  no thyromegaly.  There is no lymphadenopathy.  There is an S1 and S2, normal heart sounds.  LUNGS:  Clear.  ABDOMEN:  Benign.  LOWER EXTREMITIES:  In tact pusles and no edema.  NEUROLOGIC:  Nonfocal.   MEDICATIONS:  As listed in the chart:  She is on Crestor for hyperlipidemia.  Her blood pressure is being well-controlled on amlodipine, lisinopril, and  Coreg.   Since her blood pressure and heart rate are still fairly high she will have  her Coreg increased to 40 a day, CR.  I will see her back in about 6 months.  She knows to call me if she  were to have any significant chest pain.  She is  essentially a diabetic with single vessel, small bifurcation disease in the  circumflex with good LV function.     Noralyn Pick. Eden Emms, MD, [facility]  Electronically Signed    PCN/MedQ  DD: 03/31/2006  DT: 03/31/2006  Job #: [id number]

## 2010-10-01 NOTE — Op Note (Signed)
[name], [name]                ACCOUNT NO.:  [id number]   MEDICAL RECORD NO.:  [id number]          PATIENT TYPE:  INP   LOCATION:  5008                         FACILITY:  [facility]   PHYSICIAN:  Mark C. Ophelia Charter, M.D.    DATE OF BIRTH:  [birth year]   DATE OF PROCEDURE:  01/02/2006  DATE OF DISCHARGE:                                 OPERATIVE REPORT   PREOPERATIVE DIAGNOSIS:  C5-6, C6-7 cervical stenosis with myelopathy and  radiculopathy, left triceps weakness.   POSTOPERATIVE DIAGNOSIS:  C5-6, C6-7 cervical stenosis with myelopathy and  radiculopathy, left triceps weakness.   PROCEDURE:  C5-6, C6-7 anterior cervical diskectomy and fusion.  Left iliac  crest bone graft, autograft, and anterior cervical plating.   SURGEON:  Mark C. Ophelia Charter, M.D.   ANESTHESIA:  GET   ESTIMATED BLOOD LOSS:  Minimal.   BRIEF HISTORY:  A 76 year old female who has had myelopathy with bowel  incontinence intermittently.  Bladder incontinence, has been using Depends  with progressive weakness to the point where she is having to use a cane to  ambulate, otherwise she falls. She cannot walk up steps.  She had lower  extremity hyporeflexia and MRI scan showed cervical stenosis at C5-6 and her  surgery has been delayed somewhat due to her cardiac workup from her  significant cardiac history.   PROCEDURE IN DETAIL:  After induction of general anesthesia, with the  horseshoe head holder, arms tucked at the side, head holder traction  applied.  Iliac crest and neck prepped with DuraPrep and area was __________  with towels.  Sterile skin marker, Betadine, Vi-Drape applied.  Both areas  sterilely __________ sheets and drapes.  Incision was started at the  midline, extended to the left in line with the skin incision which was  following the skin folds.  The platysma was divided in line with the skin  incision and blunt dissection done to the level along the longus coli with  carotid sheath and contents lateral.   Inferior thyroid vessel was prominent  at the inferior aspect of the incision and was coagulated.  The C5-6 level  was localized with a needle and across the lateral C-arm after the C-arm had  been draped.  This was then pulled back up the operative field.  Self-  retaining retractors were placed right and left, moved the retractors up and  down.  Diskectomy was performed.  Thyroid retractors were used to curette  and then  a power drill was used to drill a hole.  A combination of Cloward  plug was used due to severe stenosis at C5-6 and there had been some  discussion with the patient about possible anterior and posterior procedure.  There were some hypertrophic lamina posteriorly as well as disk protrusion  with spurring anteriorly.  Power procedure drilled the spurs off.  Operative  microscope was brought in and the patient was a little bit tighter just  above the disk and just below, and a bur was used to bur out the top half of  the remaining bone which left some bone on C6  to prevent posterior migration  of the plug, but no bone at C5.  Posterior longitudinal ligament was removed  with the disk material and cord was allowed to bulge into the field.  Uncovertebral joints were stripped.  Spurs were removed with 1-mm and 10-mm  Kerrison using the operative microscope.  A 14-mm plug was then harvested  from the left iliac crest percutaneously.  The fascia was closed with 0  Vicryl.  The plug was measured based on depth gauge, countersunk a couple of  millimeters, and was secured.   Attention was then turned to C6-7 and at this level, allograft was used,  Smith-Robinson technique, which was sized at 7 mm.  The operative microscope  was used to remove the posterior aspect of the disk and the end plates were  hand curetted with the Cloward curettes for preparation.  Uncovertebral  joints were stripped.  Posterior longitudinal ligament was taken down but  not completely as it was done at the  C5-6 level.  A 7-mm gave a nice slight  distraction and a 7-mm allograft was taken.  Some of the bone from the  Cloward hole that had been drilled was used to pack the middle of the graft  and with head holder traction applied by the CRNA, momentarily the graft was  tapped into place securely.  A 34-mm bone ABI plate was selected with self-  tapping screws, 14 mm, at four corners.  Screws just missed the Cloward  drill at the top and patient had a Smith-Robinson graft to the bottom since  there was not enough room to get a second Cloward plug in, and so the screws  missed the graft and hold the bone below securely.  AP and lateral portal  spot pictures were taken as well as oblique to see if the distal screws had  good position of the screws and plate.   After irrigation, Hemovac was placed with a separate stab incision with in  and out technique, 3-0 Vicryl in the platysma, 4-0 Vicryl subcuticular.  Tincture of Benzoin, Steri-Strips to the neck.  Iliac crest split line  fascia had been closed with #1 Vicryl and 2-0 was used in the subcutaneous  tissue and a 3-0 Prolene subcuticular, with tincture of Benzoin and Steri-  Strips to that incision.  Soft cervical collar was applied to the neck.  Patient was transferred to the recovery room.  Had improved triceps in the  PACU and moving all four extremities with relief of her bilateral hand  numbness and tingling.      Mark C. Ophelia Charter, M.D.  Electronically Signed     MCY/MEDQ  D:  01/02/2006  T:  01/03/2006  Job:  [id number]

## 2010-10-01 NOTE — Assessment & Plan Note (Signed)
[facility]                              CARDIOLOGY OFFICE NOTE   NAME:Wilcox, Sally WEHNER                       MRN:          [id number]  DATE:12/09/2005                            DOB:          02/23/1935    HISTORY:  [name] returns today for followup.  She is doing fairly well.  She  is an insulin-dependent diabetic whom I saw for surgical clearance.  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 The films were reviewed with Dr. Salvadore Farber, Dr.  Jonelle Sidle and myself.  We felt that since she is a diabetic, and  this was a small vessel at a branch point, that her best therapy would be  medical.  She is not having chest pain.   I explained this again to the patient and drew her a diagram of her  circulation, particularly being a female diabetic with small vessels.  I  think the initial treatment plan should involve medical therapy for single  vessel disease.  The patient needs to have surgery on her cervical spine by  Dr. Loraine Leriche C. Yates.  In lieu of this, we will stop her amlodipine and place  her on Coreg CR.  She should be on a beta blocker to decrease her surgical  risk, and Coreg has less insulin resistance than the other beta blockers.  She is on an ACE inhibitor.  I think we need to stop the amlodipine because  her blood pressure may not tolerate both, and it is not particularly  beneficial in regards to her diabetes, heart disease or renal protective.   PHYSICAL EXAMINATION:  GENERAL:  She looks well.  VITAL SIGNS:  Blood pressure 115/70, pulse 78 and regular.  LUNGS:  Clear.  NECK:  Carotids normal.  HEART:  Normal S1, S2.  Normal heart sounds.  ABDOMEN:  Benign.  EXTREMITIES:  She has had previous lower extremity vascular surgery.   MEDICATIONS:  Listed in the chart.   IMPRESSION:  The patient is now cleared for surgical spine surgery with Dr.  Ophelia Charter.   FOLLOWUP:  I  will see her back in two months or during her hospitalization,  to assess her heart rate response to Coreg.  She knows to call us if she has  any significant problems.  She is still at a moderate risk for perioperative  complications; however, intervening with stents currently would do nothing  to lower her operative risk in the near future.   Continue beta blockade therapy would be best for her.   Dr. Ophelia Charter' office will hopefully call us to monitor her in the postoperative  period.                               Noralyn Pick. Eden Emms, MD, [facility]    PCN/MedQ  DD:  12/09/2005  DT:  12/09/2005  Job #:  [id number]

## 2010-10-19 ENCOUNTER — Telehealth: Payer: Self-pay | Admitting: *Deleted

## 2010-10-19 NOTE — Telephone Encounter (Signed)
PHARMACISTS AWARE TO HAVE PMD FILL HCTZ PT HAS NOT BEEN SEEN SINCE 12/2007./CY

## 2011-02-24 LAB — CBC
HCT: 29.1 — ABNORMAL LOW
HCT: 30.5 — ABNORMAL LOW
HCT: 32.7 — ABNORMAL LOW
Hemoglobin: 10.1 — ABNORMAL LOW
Hemoglobin: 10.2 — ABNORMAL LOW
Hemoglobin: 9.5 — ABNORMAL LOW
MCHC: 32.5
MCHC: 32.9
MCHC: 33
MCV: 87.6
MCV: 88.1
Platelets: 196
RBC: 3.51 — ABNORMAL LOW
RBC: 3.54 — ABNORMAL LOW
RBC: 3.71 — ABNORMAL LOW
RDW: 14.7 — ABNORMAL HIGH
RDW: 14.8 — ABNORMAL HIGH
RDW: 15.1 — ABNORMAL HIGH
WBC: 5.7
WBC: 6.2
WBC: 6.9
WBC: 7.6

## 2011-02-24 LAB — BASIC METABOLIC PANEL
BUN: 10
BUN: 14
BUN: 17
BUN: 22
BUN: 22
CO2: 28
CO2: 31
CO2: 31
CO2: 32
CO2: 32
Calcium: 8.9
Calcium: 8.9
Calcium: 8.9
Calcium: 9.1
Chloride: 100
Chloride: 100
Chloride: 101
Creatinine, Ser: 0.95
Creatinine, Ser: 1.06
Creatinine, Ser: 1.11
Creatinine, Ser: 1.13
GFR calc Af Amer: >60
GFR calc Af Amer: >60
GFR calc Af Amer: >60
GFR calc non Af Amer: 49 — ABNORMAL LOW
GFR calc non Af Amer: 56 — ABNORMAL LOW
GFR calc non Af Amer: 57 — ABNORMAL LOW
Glucose, Bld: 107 — ABNORMAL HIGH
Glucose, Bld: 151 — ABNORMAL HIGH
Glucose, Bld: 235 — ABNORMAL HIGH
Glucose, Bld: 237 — ABNORMAL HIGH
Glucose, Bld: 455 — ABNORMAL HIGH
Glucose, Bld: 83
Potassium: 3.6
Potassium: 4.8
Sodium: 141
Sodium: 143

## 2011-02-24 LAB — DIFFERENTIAL
Basophils Absolute: 0
Basophils Relative: 0
Eosinophils Absolute: 0
Monocytes Relative: 6
Neutrophils Relative %: 77

## 2011-02-24 LAB — COMPREHENSIVE METABOLIC PANEL
ALT: 9
AST: 11
Alkaline Phosphatase: 53
Calcium: 8.9
Creatinine, Ser: 0.89
GFR calc non Af Amer: >60
Potassium: 3.5
Total Protein: 5.4 — ABNORMAL LOW

## 2011-02-24 LAB — B-NATRIURETIC PEPTIDE (CONVERTED LAB)
Pro B Natriuretic peptide (BNP): 669 — ABNORMAL HIGH
Pro B Natriuretic peptide (BNP): 713 — ABNORMAL HIGH

## 2011-02-25 LAB — CARDIAC PANEL(CRET KIN+CKTOT+MB+TROPI)
CK, MB: 1.5
Relative Index: INVALID
Total CK: 66
Troponin I: 0.03
Troponin I: 0.04

## 2011-02-25 LAB — CBC
Hemoglobin: 10.5 — ABNORMAL LOW
Platelets: 242
RBC: 3.63 — ABNORMAL LOW
RDW: 14.3 — ABNORMAL HIGH

## 2011-02-25 LAB — URINE CULTURE
Colony Count: NO GROWTH
Culture: NO GROWTH

## 2011-02-25 LAB — COMPREHENSIVE METABOLIC PANEL
ALT: 9
ALT: 16
Alkaline Phosphatase: 70
Alkaline Phosphatase: 46
CO2: 22
CO2: 26
Calcium: 9.3
GFR calc non Af Amer: 38 — ABNORMAL LOW
GFR calc non Af Amer: >60
Glucose, Bld: 55 — ABNORMAL LOW
Glucose, Bld: 243 — ABNORMAL HIGH
Potassium: 5.2 — ABNORMAL HIGH
Sodium: 143
Sodium: 139
Total Bilirubin: 0.2 — ABNORMAL LOW

## 2011-02-25 LAB — URINALYSIS, ROUTINE W REFLEX MICROSCOPIC
Hgb urine dipstick: NEGATIVE
Ketones, ur: NEGATIVE
Leukocytes, UA: NEGATIVE
Nitrite: NEGATIVE
Protein, ur: 100 — AB
Specific Gravity, Urine: 1.023
Urobilinogen, UA: 1
Urobilinogen, UA: 0.2
pH: 5

## 2011-02-25 LAB — OCCULT BLOOD X 1 CARD TO LAB, STOOL: Fecal Occult Bld: NEGATIVE

## 2011-02-25 LAB — POCT CARDIAC MARKERS
CKMB, poc: 1.4
Troponin i, poc: <0.05

## 2011-02-25 LAB — DIFFERENTIAL
Basophils Relative: 1
Eosinophils Absolute: 0.1
Neutrophils Relative %: 69

## 2011-02-25 LAB — URINE MICROSCOPIC-ADD ON

## 2011-02-25 LAB — PROTIME-INR
INR: 0.9
Prothrombin Time: 12.3

## 2011-02-25 LAB — TYPE AND SCREEN
ABO/RH(D): A NEG
Antibody Screen: NEGATIVE

## 2011-02-25 LAB — D-DIMER, QUANTITATIVE: D-Dimer, Quant: 1.91 — ABNORMAL HIGH

## 2011-03-21 ENCOUNTER — Other Ambulatory Visit: Payer: Self-pay | Admitting: Family Medicine

## 2011-04-04 ENCOUNTER — Other Ambulatory Visit: Payer: Self-pay

## 2011-04-22 ENCOUNTER — Ambulatory Visit
Admission: RE | Admit: 2011-04-22 | Discharge: 2011-04-22 | Disposition: A | Discharge status: Home / Self Care | Payer: Medicaid Other | Source: Ambulatory Visit | Attending: Family Medicine | Admitting: Family Medicine

## 2011-04-29 ENCOUNTER — Encounter: Payer: Self-pay | Admitting: Gastroenterology

## 2011-05-25 ENCOUNTER — Ambulatory Visit: Payer: Medicare Other | Admitting: Gastroenterology

## 2011-06-17 ENCOUNTER — Telehealth: Payer: Self-pay | Admitting: *Deleted

## 2011-06-17 NOTE — Telephone Encounter (Signed)
PHARMACY  NOTIFIED PT NOT SEEN IN OUR OFFICE SINCE 2009  SEE PREVIOUS PHONE NOTE .[name]

## 2012-01-23 ENCOUNTER — Emergency Department (HOSPITAL_COMMUNITY): Payer: Medicare Other

## 2012-01-23 ENCOUNTER — Encounter (HOSPITAL_COMMUNITY): Payer: Self-pay

## 2012-01-23 ENCOUNTER — Inpatient Hospital Stay (HOSPITAL_COMMUNITY)
Admission: EM | Admit: 2012-01-23 | Discharge: 2012-01-27 | DRG: 690 | Disposition: A | Discharge status: Skilled Nursing Facility | Payer: Medicare Other | Attending: Internal Medicine | Admitting: Internal Medicine

## 2012-01-23 ENCOUNTER — Inpatient Hospital Stay (HOSPITAL_COMMUNITY): Payer: Medicare Other

## 2012-01-23 DIAGNOSIS — J441 Chronic obstructive pulmonary disease with (acute) exacerbation: Secondary | ICD-10-CM | POA: Diagnosis not present

## 2012-01-23 DIAGNOSIS — Z79899 Other long term (current) drug therapy: Secondary | ICD-10-CM

## 2012-01-23 DIAGNOSIS — N12 Tubulo-interstitial nephritis, not specified as acute or chronic: Principal | ICD-10-CM | POA: Diagnosis present

## 2012-01-23 DIAGNOSIS — R7989 Other specified abnormal findings of blood chemistry: Secondary | ICD-10-CM

## 2012-01-23 DIAGNOSIS — Z981 Arthrodesis status: Secondary | ICD-10-CM

## 2012-01-23 DIAGNOSIS — I2510 Atherosclerotic heart disease of native coronary artery without angina pectoris: Secondary | ICD-10-CM | POA: Diagnosis present

## 2012-01-23 DIAGNOSIS — R5381 Other malaise: Secondary | ICD-10-CM

## 2012-01-23 DIAGNOSIS — E1149 Type 2 diabetes mellitus with other diabetic neurological complication: Secondary | ICD-10-CM | POA: Diagnosis present

## 2012-01-23 DIAGNOSIS — I10 Essential (primary) hypertension: Secondary | ICD-10-CM | POA: Diagnosis present

## 2012-01-23 DIAGNOSIS — E1142 Type 2 diabetes mellitus with diabetic polyneuropathy: Secondary | ICD-10-CM | POA: Diagnosis present

## 2012-01-23 DIAGNOSIS — M545 Low back pain, unspecified: Secondary | ICD-10-CM | POA: Diagnosis present

## 2012-01-23 DIAGNOSIS — E1169 Type 2 diabetes mellitus with other specified complication: Secondary | ICD-10-CM | POA: Diagnosis present

## 2012-01-23 DIAGNOSIS — E039 Hypothyroidism, unspecified: Secondary | ICD-10-CM

## 2012-01-23 DIAGNOSIS — I5189 Other ill-defined heart diseases: Secondary | ICD-10-CM

## 2012-01-23 DIAGNOSIS — I959 Hypotension, unspecified: Secondary | ICD-10-CM | POA: Diagnosis not present

## 2012-01-23 DIAGNOSIS — R0902 Hypoxemia: Secondary | ICD-10-CM

## 2012-01-23 DIAGNOSIS — R4182 Altered mental status, unspecified: Secondary | ICD-10-CM

## 2012-01-23 DIAGNOSIS — D649 Anemia, unspecified: Secondary | ICD-10-CM

## 2012-01-23 DIAGNOSIS — N390 Urinary tract infection, site not specified: Secondary | ICD-10-CM

## 2012-01-23 DIAGNOSIS — E119 Type 2 diabetes mellitus without complications: Secondary | ICD-10-CM

## 2012-01-23 DIAGNOSIS — J449 Chronic obstructive pulmonary disease, unspecified: Secondary | ICD-10-CM

## 2012-01-23 DIAGNOSIS — R0602 Shortness of breath: Secondary | ICD-10-CM

## 2012-01-23 DIAGNOSIS — E7800 Pure hypercholesterolemia, unspecified: Secondary | ICD-10-CM | POA: Diagnosis present

## 2012-01-23 DIAGNOSIS — R1310 Dysphagia, unspecified: Secondary | ICD-10-CM

## 2012-01-23 DIAGNOSIS — N170 Acute kidney failure with tubular necrosis: Secondary | ICD-10-CM | POA: Diagnosis present

## 2012-01-23 DIAGNOSIS — R609 Edema, unspecified: Secondary | ICD-10-CM

## 2012-01-23 HISTORY — DX: Essential (primary) hypertension: I10

## 2012-01-23 LAB — URINALYSIS, ROUTINE W REFLEX MICROSCOPIC
Glucose, UA: NEGATIVE mg/dL
Hgb urine dipstick: NEGATIVE
Ketones, ur: NEGATIVE mg/dL
pH: 5 (ref 5.0–8.0)

## 2012-01-23 LAB — GLUCOSE, CAPILLARY
Glucose-Capillary: 128 mg/dL — ABNORMAL HIGH (ref 70–99)
Glucose-Capillary: 103 mg/dL — ABNORMAL HIGH (ref 70–99)

## 2012-01-23 LAB — CBC WITH DIFFERENTIAL/PLATELET
Eosinophils Relative: 0 % (ref 0–5)
HCT: 41 % (ref 36.0–46.0)
Lymphocytes Relative: 16 % (ref 12–46)
Lymphs Abs: 2.2 10*3/uL (ref 0.7–4.0)
MCH: 29.1 pg (ref 26.0–34.0)
MCHC: 32.4 g/dL (ref 30.0–36.0)
Monocytes Relative: 6 % (ref 3–12)
Neutro Abs: 10.2 10*3/uL — ABNORMAL HIGH (ref 1.7–7.7)
Neutrophils Relative %: 77 % (ref 43–77)
RDW: 13.2 % (ref 11.5–15.5)

## 2012-01-23 LAB — BASIC METABOLIC PANEL
BUN: 27 mg/dL — ABNORMAL HIGH (ref 6–23)
Creatinine, Ser: 1.17 mg/dL — ABNORMAL HIGH (ref 0.50–1.10)
GFR calc Af Amer: 52 mL/min — ABNORMAL LOW (ref >90)
GFR calc non Af Amer: 45 mL/min — ABNORMAL LOW (ref >90)

## 2012-01-23 LAB — URINE MICROSCOPIC-ADD ON

## 2012-01-23 MED ORDER — SULFAMETHOXAZOLE-TRIMETHOPRIM 400-80 MG/5ML IV SOLN
10.0000 mg/kg | Freq: Once | INTRAVENOUS | Status: AC
  Administered 2012-01-23: 816 mg via INTRAVENOUS
  Filled 2012-01-23: qty 51

## 2012-01-23 MED ORDER — OCUVITE PO TABS
1.0000 | ORAL_TABLET | Freq: Every day | ORAL | Status: DC
  Administered 2012-01-24 – 2012-01-27 (×3): 1 via ORAL
  Filled 2012-01-23 (×4): qty 1

## 2012-01-23 MED ORDER — BISACODYL 5 MG PO TBEC
5.0000 mg | DELAYED_RELEASE_TABLET | Freq: Every day | ORAL | Status: DC | PRN
  Filled 2012-01-23: qty 1

## 2012-01-23 MED ORDER — OXYBUTYNIN CHLORIDE 5 MG PO TABS
5.0000 mg | ORAL_TABLET | Freq: Every day | ORAL | Status: DC | PRN
  Filled 2012-01-23: qty 2

## 2012-01-23 MED ORDER — HYDROCODONE-ACETAMINOPHEN 5-325 MG PO TABS
1.0000 | ORAL_TABLET | Freq: Four times a day (QID) | ORAL | Status: DC | PRN
  Administered 2012-01-23 – 2012-01-24 (×4): 1 via ORAL
  Filled 2012-01-23 (×4): qty 1

## 2012-01-23 MED ORDER — HYDROCODONE-ACETAMINOPHEN 5-500 MG PO TABS: 1.0000 | ORAL_TABLET | Freq: Four times a day (QID) | ORAL | Status: DC | PRN

## 2012-01-23 MED ORDER — AMITRIPTYLINE HCL 25 MG PO TABS
25.0000 mg | ORAL_TABLET | Freq: Every day | ORAL | Status: DC
  Administered 2012-01-23 – 2012-01-26 (×4): 25 mg via ORAL
  Filled 2012-01-23 (×5): qty 1

## 2012-01-23 MED ORDER — ACETAMINOPHEN 325 MG PO TABS
650.0000 mg | ORAL_TABLET | Freq: Once | ORAL | Status: AC
  Administered 2012-01-23: 650 mg via ORAL
  Filled 2012-01-23: qty 2

## 2012-01-23 MED ORDER — SODIUM CHLORIDE 0.9 % IV SOLN
INTRAVENOUS | Status: DC
  Administered 2012-01-23: 13:00:00 via INTRAVENOUS

## 2012-01-23 MED ORDER — DEXTROSE 50 % IV SOLN
INTRAVENOUS | Status: AC
  Filled 2012-01-23: qty 50

## 2012-01-23 MED ORDER — AMLODIPINE BESYLATE 10 MG PO TABS
10.0000 mg | ORAL_TABLET | Freq: Every day | ORAL | Status: DC
  Administered 2012-01-24: 10 mg via ORAL
  Filled 2012-01-23: qty 1

## 2012-01-23 MED ORDER — TRAZODONE 25 MG HALF TABLET
25.0000 mg | ORAL_TABLET | Freq: Every evening | ORAL | Status: DC | PRN
  Filled 2012-01-23: qty 1

## 2012-01-23 MED ORDER — DEXTROSE 5 % IV SOLN
10.0000 mg/kg | Freq: Once | INTRAVENOUS | Status: DC
  Filled 2012-01-23: qty 32.6

## 2012-01-23 MED ORDER — ALPRAZOLAM 0.25 MG PO TABS: 0.2500 mg | ORAL_TABLET | Freq: Two times a day (BID) | ORAL | Status: DC | PRN

## 2012-01-23 MED ORDER — HEPARIN SODIUM (PORCINE) 5000 UNIT/ML IJ SOLN
5000.0000 [IU] | Freq: Three times a day (TID) | INTRAMUSCULAR | Status: DC
  Administered 2012-01-23 – 2012-01-27 (×12): 5000 [IU] via SUBCUTANEOUS
  Filled 2012-01-23 (×14): qty 1

## 2012-01-23 MED ORDER — SODIUM CHLORIDE 0.9 % IV SOLN
INTRAVENOUS | Status: DC
  Administered 2012-01-26: 13:00:00 via INTRAVENOUS

## 2012-01-23 MED ORDER — CARVEDILOL 25 MG PO TABS
25.0000 mg | ORAL_TABLET | Freq: Two times a day (BID) | ORAL | Status: DC
  Administered 2012-01-24 – 2012-01-27 (×8): 25 mg via ORAL
  Filled 2012-01-23 (×9): qty 1

## 2012-01-23 MED ORDER — LEVOTHYROXINE SODIUM 50 MCG PO TABS
50.0000 ug | ORAL_TABLET | Freq: Every day | ORAL | Status: DC
  Administered 2012-01-24 – 2012-01-27 (×4): 50 ug via ORAL
  Filled 2012-01-23 (×4): qty 1

## 2012-01-23 MED ORDER — MORPHINE SULFATE 4 MG/ML IJ SOLN
4.0000 mg | Freq: Once | INTRAMUSCULAR | Status: AC
  Administered 2012-01-23: 4 mg via INTRAVENOUS
  Filled 2012-01-23: qty 1

## 2012-01-23 MED ORDER — SODIUM CHLORIDE 0.9 % IV SOLN: INTRAVENOUS | Status: DC

## 2012-01-23 MED ORDER — SODIUM CHLORIDE 0.9 % IJ SOLN
3.0000 mL | Freq: Two times a day (BID) | INTRAMUSCULAR | Status: DC
  Administered 2012-01-24 – 2012-01-27 (×4): 3 mL via INTRAVENOUS

## 2012-01-23 MED ORDER — CLONIDINE HCL 0.1 MG PO TABS
0.1000 mg | ORAL_TABLET | Freq: Two times a day (BID) | ORAL | Status: DC
  Administered 2012-01-23 – 2012-01-24 (×2): 0.1 mg via ORAL
  Filled 2012-01-23 (×3): qty 1

## 2012-01-23 MED ORDER — LEVOFLOXACIN IN D5W 500 MG/100ML IV SOLN
500.0000 mg | INTRAVENOUS | Status: DC
  Administered 2012-01-23: 500 mg via INTRAVENOUS
  Filled 2012-01-23 (×2): qty 100

## 2012-01-23 NOTE — ED Provider Notes (Signed)
Medical screening examination/treatment/procedure(s) were conducted as a shared visit with non-physician practitioner(s) and myself.  I personally evaluated the patient during the encounter C/o left hip pain and lower back pain after fall off bench porch swing. Also c/o weakness.  + uti and ttp of left hip and lower back.  Will get labs and xr for eval.    Cheri Guppy, MD 01/23/12 1527

## 2012-01-23 NOTE — ED Provider Notes (Signed)
History     CSN: [id number]  Arrival date & time 01/23/12  1106   First MD Initiated Contact with Patient 01/23/12 1343      Chief Complaint  Patient presents with  . Altered Mental Status    (Consider location/radiation/quality/duration/timing/severity/associated sxs/prior treatment) HPI Comments: [name] 77 y.o. female   The chief complaint is: Patient presents with:   Altered Mental Status    77 year old female the chief complaint of hypoglycemia. Her grandson is in the room. He lives with her and provide some of the history. He states that last night. He and his father went to see his grandmother. This morning when he talked to her about it. She had no recollection of the event. She is a diabetic and is taking insulin. The patient's daughter administered her normal dose of insulin yesterday. Patient has had a urinary symptoms, such as frequency of urination and burning with urination. He denies flank pain, fever, chills, nausea, vomiting, diarrhea, abdominal pain. She denies chest pain or shortness of breath. She denies weakness, aphasia dystaxia. She is a current everyday smoker. Proximal proximally 2 weeks ago. Patient fell onto her bottom more so on the left hip. She was sitting into a porch swing and makes it. She has had significant lower back, sacral, and left hip pain since that time and has been unable to walk. She denies swelling, deformity or ecchymosis.    Patient is a 77 y.o. female presenting with altered mental status. The history is provided by the patient and a relative. No language interpreter was used.  Altered Mental Status Pertinent negatives include no abdominal pain, chest pain, chills, diaphoresis, fever, nausea, neck pain, numbness, rash or vomiting.    Past Medical History  Diagnosis Date  . Diabetes mellitus   . Hypertension   . Neuropathy     No past surgical history on file.  No family history on file.  History  Substance Use Topics    . Smoking status: Not on file  . Smokeless tobacco: Not on file  . Alcohol Use:     OB History    Grav Para Term Preterm Abortions TAB SAB Ect Mult Living                  Review of Systems  Constitutional: Negative for fever, chills, diaphoresis, appetite change and unexpected weight change.  HENT: Negative for trouble swallowing and neck pain.   Respiratory: Negative for chest tightness and shortness of breath.   Cardiovascular: Negative for chest pain and palpitations.  Gastrointestinal: Negative for nausea, vomiting, abdominal pain, diarrhea, blood in stool and abdominal distention.  Genitourinary: Positive for dysuria and frequency. Negative for hematuria and flank pain.  Musculoskeletal: Positive for back pain and gait problem.  Skin: Negative for rash.  Neurological: Negative for dizziness, facial asymmetry, speech difficulty and numbness.  Psychiatric/Behavioral: Positive for altered mental status.    Allergies  Penicillins  Home Medications   Current Outpatient Rx  Name Route Sig Dispense Refill  . ALPRAZOLAM 0.25 MG PO TABS Oral Take 0.25 mg by mouth 2 (two) times daily as needed. For anxiety    . AMITRIPTYLINE HCL 25 MG PO TABS Oral Take 25 mg by mouth at bedtime.    Marland Kitchen AMLODIPINE BESYLATE 10 MG PO TABS Oral Take 10 mg by mouth daily.    . OCUVITE PO TABS Oral Take 1 tablet by mouth daily.    Marland Kitchen BISACODYL 5 MG PO TBEC Oral Take 5 mg by  mouth daily as needed. constipation    . CARVEDILOL 25 MG PO TABS Oral Take 25 mg by mouth 2 (two) times daily with a meal.    . CLONIDINE HCL 0.1 MG PO TABS Oral Take 0.1 mg by mouth 2 (two) times daily.    Marland Kitchen HYDROCHLOROTHIAZIDE 25 MG PO TABS Oral Take 25 mg by mouth daily.    Marland Kitchen HYDROCODONE-ACETAMINOPHEN 5-500 MG PO TABS Oral Take 1 tablet by mouth every 6 (six) hours as needed. For pain    . LEVOTHYROXINE SODIUM 50 MCG PO TABS Oral Take 50 mcg by mouth daily.    . ICAPS MV PO Oral Take 1 tablet by mouth daily.    . OXYBUTYNIN  CHLORIDE 5 MG PO TABS Oral Take 5-10 mg by mouth daily as needed. For incontinence    . ROSUVASTATIN CALCIUM 20 MG PO TABS Oral Take 20 mg by mouth daily.      BP 188/54  Pulse 74  Temp 99.3 F (37.4 C) (Oral)  Resp 15  Wt 115 lb (52.164 kg)  SpO2 96%  Physical Exam  Nursing note and vitals reviewed. Constitutional: She is oriented to person, place, and time. She has a sickly appearance. She appears ill.       Ill appearing woman, lying in the fetal position on her bed.  HENT:  Head: Normocephalic and atraumatic.  Eyes: Conjunctivae and EOM are normal. Pupils are equal, round, and reactive to light. Right eye exhibits no discharge. Left eye exhibits no discharge.  Neck: Normal range of motion. Neck supple.  Cardiovascular: Normal rate, regular rhythm, normal heart sounds and intact distal pulses.  Exam reveals no gallop and no friction rub.   No murmur heard. Pulmonary/Chest: Effort normal. No respiratory distress. She has no wheezes. She exhibits no tenderness.  Abdominal: Soft. Bowel sounds are normal. She exhibits no distension and no mass. There is no tenderness. There is no guarding.  Musculoskeletal:       [place] tenderness to palpation of the lumbar spinous processes. Tenderness to palpation of the left sacral border and SI joint. Tenderness to palpation of the left greater trochanter. Range of motion of the hip is limited by pain. Pain is greater with external rotation of the left hip   Neurological: She is alert and oriented to person, place, and time. She has normal reflexes. No cranial nerve deficit. Coordination normal.  Skin: Skin is warm and dry. No rash noted.    ED Course  Procedures (including critical care time)  Labs Reviewed  GLUCOSE, CAPILLARY - Abnormal; Notable for the following:    Glucose-Capillary 128 (*)     All other components within normal limits  BASIC METABOLIC PANEL - Abnormal; Notable for the following:    Glucose, Bld 115 (*)     BUN 27 (*)      Creatinine, Ser 1.17 (*)     GFR calc non Af Amer 45 (*)     GFR calc Af Amer 52 (*)     All other components within normal limits  CBC WITH DIFFERENTIAL - Abnormal; Notable for the following:    WBC 13.2 (*)     Neutro Abs 10.2 (*)     All other components within normal limits  URINALYSIS, ROUTINE W REFLEX MICROSCOPIC - Abnormal; Notable for the following:    Protein, ur 100 (*)     Leukocytes, UA MODERATE (*)     All other components within normal limits  URINE MICROSCOPIC-ADD ON - Abnormal;  Notable for the following:    Squamous Epithelial / LPF FEW (*)     Bacteria, UA MANY (*)     All other components within normal limits   No results found.  UTI present on urinalysis. Patient is allergic to penicillins. Going to treat her with IV Septra while she is here at the hospital. BMP shows some mild renal insufficiency and a small bump in her creatinine since her last evaluation. Here the ED. CBC shows elevated white count. Blood glucose is normal. Since administration of the dextrose in the ambulance. Patient is alert and oriented at this time. Awaiting results of x-rays.  No diagnosis found.    MDM  Awaiting x-ray results. I spoken with Dr. Deretha Emory, who has agreed to assume care of the patient. Patient states that her pain is better controlled now with administration of morphine. Plan is for admission UTI and hyperglycemia.        Arthor Captain, PA-C 01/23/12 1628

## 2012-01-23 NOTE — ED Notes (Signed)
Patient drank orange juice 40z without incident

## 2012-01-23 NOTE — ED Notes (Addendum)
Per EMS, pt found unable to be woken up by family this morning and they called EMS at 1020.  EMS states she was snoring and initial CBG check was 41. Gave 25 gm D10 with new CBG of 173. Pt woke up after that but was confused and is only oriented to person. Family informed EMS that they were going to bring her in today anyway for a fall 3 weeks ago with increased left hip pain. 20g to LH, 180/100, 70 HR, SR on monitor, placed on 4 L/[place].  CBG performed during triage and was 128.

## 2012-01-23 NOTE — ED Notes (Signed)
Speaking with youngest son about pt's condition. Pt currently lives by herself instead of with family as informed by granddaughter. Youngest son is actively seeking counsel about getting POA papers drawn up by a Clinical research associate.

## 2012-01-23 NOTE — ED Provider Notes (Signed)
Assumed pt care in CDU.  77 year old female presents for evaluation of confusion and L hip pain.  UA shows evidence of UTI. Bactrim were given via IV as treatment, urine culture sent.  Pain to L hip with negative xray today.  On exam pt is A&O x 3, heart S1S2, no M/R/G, lung with scattered wheezes without R/R, abd soft, nontender, L hip with increasing pain on flexion and extension, no overlying skin changes.  No palpable cords, erythema, neg homans sign.   Plan to call for admission for further  Management of her UTI.    8:44 PM i have consulted with [facility], who agrees to see pt in ED and will admit as appropriate.    Fayrene Helper, PA-C 01/26/12 1523

## 2012-01-23 NOTE — ED Notes (Signed)
Family states patient complaining of back pain. Requesting pain meds. Patient transported to xray and then to floor. Will continue to monitor patient

## 2012-01-23 NOTE — H&P (Addendum)
[facility] History and Physical  [name] WUJ:[id number] DOB: 1934/07/26 DOA: 01/23/2012  Referring physician: Mr. Fayrene Helper PCP: Lupita Raider, MD   Chief Complaint: Altered mental status  HPI: [name] is a 77 y.o. female  She was found to have altered mental status on arrival to ED.  SHe is fully oriented now.  SHe states that she thinks jherSugars went dwn this morning to 41-She was told this by her Grandson.  THe low sugars have been going on for about 1-2 months-is on insulin.  Sh states that she was decreased from lantus 60--->50 units.  That was about 1 month ago.  She states she was in [facility] when blood sugar drops ovvured.  No recent h/o chills or fever other than this morning-this was not measured. No cough or cold, no dysuria, , no n/v no CP currently She has never had AMS before and ad a fall about 2 weeks ago when she fell of the swing.  SHe didn;t tell her dr. This.  SHe cannot walk much due to this.  Can walk maybe about 10 steps now-had no limitation prior.  Using the wheelchair (electric chair ) for a cpl of days.  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  It took 5 min to wake her up, she received IVF-she became somewhat coherent-her Blood sugar was down to 40.  She has been out in [place] and her CBG  have been normal and everything had been good till 01/19/2012.  The CBG that am was 50-she was incoherent and babbling that day repeating herself.  She was given OJ and food and was fine.  She reportedly is on Lantus 50.   SHe has been eating and drinking normally-but was not eating much yesterday.  Saturday she c/o she couldn't walk and she couldnt move her legs. He also shares that on 01/21/2012 patient had been put to bed because of severe pain in her lower back by the  patient's daughter and daughter-in-law.   He thinks she has been stressed out due to Health care-she also has bad neurpathy aand Arthritis has worsened--[facility]. Usually is pretty coherent and normally has a good memory  Workup in the emergency room revealed BUN/creatinine = 27/1.17, baseline = 15/1.19  CBC with white count of 13.2 thousand UA showed many bacteria few scores epithelial cells proteinuria of 100 normal specific gravity 21-50 WBCs Hip x-ray 9/9 = old healed fracture in the right inferior pubic gram is no acute fractures elsewhere sacroiliac joints and symphysis pubis intact Lumbar spine x-ray showed no acute crushes abnormality, progressive degenerative disc disease and spondylolisthesis L1/2, L2/V., L 3/4 with diffuse facet degenerative changes  Review of Systems:  As per above-patient in addition has no focal neurological complaints and does not weakness on any one side of the body as well as does not have headache chills fever subjectively    Chart review  History of posterior cervical instrumentation and fusion at C5-6 C6-7 02/28/2007 by Dr. Tressie Stalker  History of cardiac catheterization 11/30/2010 for perioperative clearance showing 75% stenosis in mid to distal circumflex obtuse marginal-EF at that time was 55% mid posterior hypokinesis-she was medically managed at that time  History of insulin-dependent diabetes  Admission for COPD exacerbation with pulmonary deconditioning--Noted to have hypertension diabetes and hypothyroid at that admission  History of right femoral AK pop bypass grafting with translocation of saphenous vein on 08/16/1995 and left iliac PTA and stent 08/20/1992  Past Medical History  Diagnosis Date  . Diabetes mellitus   . Hypertension   . Neuropathy    No past surgical history on file. Social History:  does not have a smoking history on file. She does not have any smokeless tobacco history on file. Her alcohol and drug histories  not on file. Patient lives at home with grandson and can do some ADLs but not all see history of present illne  Allergies  Allergen Reactions  . Penicillins Hives    Family History  Problem Relation Age of Onset  . Family history unknown: Yes   not able to complete family history given patient cannot recall and is mildly confused   Prior to Admission medications   Medication Sig Start Date End Date Taking? Authorizing Provider  ALPRAZolam (XANAX) 0.25 MG tablet Take 0.25 mg by mouth 2 (two) times daily as needed. For anxiety   Yes Historical Provider, MD  amitriptyline (ELAVIL) 25 MG tablet Take 25 mg by mouth at bedtime.   Yes Historical Provider, MD  amLODipine (NORVASC) 10 MG tablet Take 10 mg by mouth daily.   Yes Historical Provider, MD  beta carotene w/minerals (OCUVITE) tablet Take 1 tablet by mouth daily.   Yes Historical Provider, MD  bisacodyl (DULCOLAX) 5 MG EC tablet Take 5 mg by mouth daily as needed. constipation   Yes Historical Provider, MD  carvedilol (COREG) 25 MG tablet Take 25 mg by mouth 2 (two) times daily with a meal.   Yes Historical Provider, MD  cloNIDine (CATAPRES) 0.1 MG tablet Take 0.1 mg by mouth 2 (two) times daily.   Yes Historical Provider, MD  hydrochlorothiazide (HYDRODIURIL) 25 MG tablet Take 25 mg by mouth daily.   Yes Historical Provider, MD  HYDROcodone-acetaminophen (VICODIN) 5-500 MG per tablet Take 1 tablet by mouth every 6 (six) hours as needed. For pain   Yes Historical Provider, MD  levothyroxine (SYNTHROID, LEVOTHROID) 50 MCG tablet Take 50 mcg by mouth daily.   Yes Historical Provider, MD  Multiple Vitamins-Minerals (ICAPS MV PO) Take 1 tablet by mouth daily.   Yes Historical Provider, MD  oxybutynin (DITROPAN) 5 MG tablet Take 5-10 mg by mouth daily as needed. For incontinence   Yes Historical Provider, MD  rosuvastatin (CRESTOR) 20 MG tablet Take 20 mg by mouth daily.   Yes Historical Provider, MD   Physical Exam: Filed Vitals:    01/23/12 1615 01/23/12 1705 01/23/12 1820 01/23/12 2035  BP:  170/49 148/40 118/37  Pulse: 77 92 75 61  Temp:  99.9 F (37.7 C) 100.4 F (38 C) 99.4 F (37.4 C)  TempSrc:  Oral Oral   Resp: 21 21 26 17   Weight: 180 lb (81.647 kg)     SpO2: 96% 96% 90% 94%     General:  Alert slightly confused pleasant Caucasian female  Eyes: Arcus senilis present, no pallor or icterus extraocular movements intact  ENT: Throat clear but edentulous uvula midline, smile symmetric  Neck: Soft, no thyromegaly, no JVD no cardiac bruit  Cardiovascular: S1-S2 no murmur noted on auscultation regular rate rhythm  Respiratory: Clinically clear no added sound no tactile vocal resonance or fremitus  Abdomen: Soft nontender nondistended  Skin: Slightly red lower extremities no warmth or edema noted  Musculoskeletal: Able to move all 4 limbs equally no joint swelling-patient is point tenderness to the lumbosacral region  at L4-5 with no radiating qualities per history. She is able to flex and extend her hips passively without much pain external and internal logrolling of the right and left thighs do not reproduce pain.  Psychiatric: Minimally confused however can affirm most of the discussion that I had with her son  Neurologic: Vital 5 power to ankles knees and hips bilaterally, patient is able sit herself up in bed without assistance reflexes 2/3 in knees and ankles sensation grossly intact  Labs on Admission:  Basic Metabolic Panel:  Lab 01/23/12 4540  NA 140  K 4.5  CL 106  CO2 20  GLUCOSE 115*  BUN 27*  CREATININE 1.17*  CALCIUM 9.9  MG --  PHOS --   Liver Function Tests: No results found for this basename: AST:5,ALT:5,ALKPHOS:5,BILITOT:5,PROT:5,ALBUMIN:5 in the last 168 hours No results found for this basename: LIPASE:5,AMYLASE:5 in the last 168 hours No results found for this basename: AMMONIA:5 in the last 168 hours CBC:  Lab 01/23/12 1221  WBC 13.2*  NEUTROABS 10.2*  HGB 13.3    HCT 41.0  MCV 89.7  PLT 247   Cardiac Enzymes: No results found for this basename: CKTOTAL:5,CKMB:5,CKMBINDEX:5,TROPONINI:5 in the last 168 hours  BNP (last 3 results) No results found for this basename: PROBNP:3 in the last 8760 hours CBG:  Lab 01/23/12 1108  GLUCAP 128*    Radiological Exams on Admission: Dg Lumbar Spine Complete  01/23/2012  *RADIOLOGY REPORT*  Clinical Data: Fall.  Low back pain.  Acute mental status changes.  LUMBAR SPINE - COMPLETE 4+ VIEW  Comparison: Lumbar spine x-rays 05/18/2005.  Findings: Five non-rib bearing lumbar vertebrae with anatomic alignment.  No fractures.  Moderate disc space narrowing at L3-4 and mild disc space narrowing at L1-2 and L2-3, progressive since the prior examination.  Extensive lower thoracic spondylosis, also progressive.  No pars defects.  Diffuse facet degenerative changes. Visualized sacroiliac joints intact.  Right common iliac artery stent.  IMPRESSION: No acute osseous abnormality.  Progressive degenerative disc disease and spondylosis at L1-2, L2-3, and L3-4 since 2007 (worst at L3-4).  Diffuse facet degenerative changes.   Original Report Authenticated By: Arnell Sieving, M.D.    Dg Hip Complete Left  01/23/2012  *RADIOLOGY REPORT*  Clinical Data: Fall.  Left hip pain.  Acute mental status changes.  LEFT HIP - COMPLETE 2+ VIEW  Comparison: AP pelvis 05/18/2005.  Findings: No evidence of acute fracture or dislocation.  Joint space well preserved for age.  Bone mineral density well preserved.  Included AP pelvis shows an old healed fracture the right inferior pubic ramus.  No acute fractures elsewhere.  Contralateral right hip joint intact.  Sacroiliac joints and symphysis pubis intact.  IMPRESSION: No acute osseous abnormality.   Original Report Authenticated By: Arnell Sieving, M.D.     EKG: Independently reviewed. Sinus rhythm rate of about 70 PR interval slightly prolonged at 0.20 with first degree A-V LOC QRS axis = -30  right bundle branch block noted in leads V1 2 and 3 no ST-T wave depressions or elevations noted across precordial leads. R-wave progression occurs at C4.  Assessment/Plan Principal Problem:  *Pyelonephritis Active Problems:  HYPOTHYROIDISM  DM  HYPERCHOLESTEROLEMIA  HYPERTENSION  CAD  COPD  Lumbago   1. Potential Pyelonephritis-although patient's UA is dirty this was not a clean catch. Given her age, although she does not have any respiratory symptoms and chest x-ray has not been done by the emergency room-I feel that this is warranted. We will change  Bactrim IV to Levaquin and if chest x-ray is negative this can hopefully be transitioned to by mouth by am-I will change the medication because Bactrim can cause interstitial nephritis and worsening of AKI-I will place a Foley catheter and a clean specimen and a urine culture from that specimen 2. Acute kidney injury-cautious intravascular repletion at 50 cc an hour given she has nonobstructive cardiac disease. I would switch to by mouth liquids in the morning and follow basic metabolic panel-she will need a Foley short-term 3. Lower back pain-get sacral x-rays as well- I would leave this up to the discretion of attending physician in the morning to speak with orthopedics 4. Diabetes mellitus-we will hold all insulin for now and get CBG 4 times a day a.c. at bedtime and if needed we will implement a very small amount of long acting Lantus and hold on a sliding scale coverage. This will need outpatient adjustment by her primary care physician-would potentially not continue her on insulin-get A1c and d/c insulin if below 6.5 5. Hypothyroidism-continue Synthroid 50 mcg daily 6. Hyperlipidemia-Crestor held while in hospital-resume the same as an outpatient 7. Accelerated hypertension-reordered Coreg 25 mg twice a day which she probably has not had today, clonidine to 0.1 mg twice a day, amlodipine 10 mg daily. The reason for her elevation in blood  pressures probably because she missed a dose of her clonidine today. I will hold her HCTZ given she developed involving the patient likely secondary to this. 8. diabetic neuropathy continue Elavil 25 mg at night   Code Status: Full Family Communication: See above Disposition Plan: Admit to general telemetry team 10  Time spent: One hour  Mahala [facility]eng[facility] Pager [phone]  If 7PM-7AM, please contact night-coverage [email or web address] Password [email or web address] 01/23/2012, 8:40 PM

## 2012-01-23 NOTE — ED Notes (Signed)
MD at bedside. 

## 2012-01-23 NOTE — ED Notes (Signed)
Pt returned from xray

## 2012-01-23 NOTE — ED Notes (Signed)
Asked patient if she felt safe living at home by herself and she replied "no".

## 2012-01-24 ENCOUNTER — Encounter (HOSPITAL_COMMUNITY): Payer: Self-pay | Admitting: *Deleted

## 2012-01-24 ENCOUNTER — Inpatient Hospital Stay (HOSPITAL_COMMUNITY): Payer: Medicare Other

## 2012-01-24 DIAGNOSIS — N12 Tubulo-interstitial nephritis, not specified as acute or chronic: Principal | ICD-10-CM

## 2012-01-24 DIAGNOSIS — E039 Hypothyroidism, unspecified: Secondary | ICD-10-CM

## 2012-01-24 DIAGNOSIS — I10 Essential (primary) hypertension: Secondary | ICD-10-CM

## 2012-01-24 DIAGNOSIS — M545 Low back pain: Secondary | ICD-10-CM

## 2012-01-24 LAB — URINALYSIS, ROUTINE W REFLEX MICROSCOPIC
Glucose, UA: NEGATIVE mg/dL
Protein, ur: 100 mg/dL — AB
Specific Gravity, Urine: 1.013 (ref 1.005–1.030)
pH: 5 (ref 5.0–8.0)

## 2012-01-24 LAB — GLUCOSE, CAPILLARY
Glucose-Capillary: 105 mg/dL — ABNORMAL HIGH (ref 70–99)
Glucose-Capillary: 106 mg/dL — ABNORMAL HIGH (ref 70–99)
Glucose-Capillary: 108 mg/dL — ABNORMAL HIGH (ref 70–99)
Glucose-Capillary: 156 mg/dL — ABNORMAL HIGH (ref 70–99)

## 2012-01-24 LAB — URINE MICROSCOPIC-ADD ON

## 2012-01-24 LAB — COMPREHENSIVE METABOLIC PANEL
AST: 13 U/L (ref 0–37)
CO2: 20 mEq/L (ref 19–32)
Calcium: 9.1 mg/dL (ref 8.4–10.5)
Chloride: 109 mEq/L (ref 96–112)
Creatinine, Ser: 1.31 mg/dL — ABNORMAL HIGH (ref 0.50–1.10)
GFR calc Af Amer: 45 mL/min — ABNORMAL LOW (ref >90)
GFR calc non Af Amer: 39 mL/min — ABNORMAL LOW (ref >90)
Glucose, Bld: 94 mg/dL (ref 70–99)
Total Bilirubin: 0.1 mg/dL — ABNORMAL LOW (ref 0.3–1.2)

## 2012-01-24 LAB — HEMOGLOBIN A1C: Hgb A1c MFr Bld: 7 % — ABNORMAL HIGH (ref <5.7)

## 2012-01-24 LAB — CBC
MCH: 29.2 pg (ref 26.0–34.0)
MCHC: 32.6 g/dL (ref 30.0–36.0)
Platelets: 224 10*3/uL (ref 150–400)
RBC: 3.73 MIL/uL — ABNORMAL LOW (ref 3.87–5.11)

## 2012-01-24 MED ORDER — LIVING BETTER WITH HEART FAILURE BOOK
Freq: Once | Status: DC
  Filled 2012-01-24: qty 1

## 2012-01-24 MED ORDER — SODIUM CHLORIDE 0.9 % IV SOLN
INTRAVENOUS | Status: DC
  Administered 2012-01-24 (×2): via INTRAVENOUS
  Administered 2012-01-24: 125 mL/h via INTRAVENOUS
  Administered 2012-01-25 – 2012-01-26 (×2): via INTRAVENOUS

## 2012-01-24 NOTE — ED Provider Notes (Signed)
Medical screening examination/treatment/procedure(s) were conducted as a shared visit with non-physician practitioner(s) and myself.  I personally evaluated the patient during the encounter  Nyashia Raney, MD 01/24/12 0720 

## 2012-01-24 NOTE — Progress Notes (Signed)
Orthostatic vital signs were positive.  SBP dropped from 125 lying to 90 standing.  MD notified.  New orders received.  Will continue to monitor.

## 2012-01-24 NOTE — Progress Notes (Signed)
[facility] PROGRESS NOTE  [name] MVH:[id number] DOB: Sep 15, 1934 DOA: 01/23/2012 PCP: Lupita Raider, MD  Assessment/Plan: Principal Problem:  *Pyelonephritis: Continue IV antibiotics. She's allergic to penicillin so currently on IV Levaquin. Waiting for cultures and sensitivities to return.  Hypotension: See below.  Active Problems:  HYPOTHYROIDISM: Stable.   DM: Sugars here have been fine. A1c at 7.   HYPERCHOLESTEROLEMIA: Stable.   HYPERTENSION: Patient is she presented with elevated blood pressures requiring resumption of her home medications plus additional. She's now been more borderline hypotensive and that is with some IV fluids. I held her clonidine and her diuretic was already on hold from admission. As her pressures,, can restart these medications  CAD: Stable.  COPD: Stable. Breathing comfortably.  Status post recent fall: No fractures.   Lumbago: Pain control   Code Status: Full Family Communication: Discussed plan with patient at bedside Disposition Plan: Home, hopefully tomorrow   Brief narrative: 77 year old white female passed a history of hypertension, diabetes and COPD who presented with altered mental status and has been having hypoglycemic events for the past few months. She was brought in by her family after having more confusion and the patient reports she thinks her blood sugars were low in the early morning of 9/9. In emergency room she is on a white count of 13.2 and a very large urinary tract infection we'll mildly elevated BUN and creatinine. She was little confused on admission but she she's never had altered mental status before) reportedly she had a fall recently in the past few weeks. X-rays were unremarkable for any fractures. Patient was admitted for treatment of pyelonephritis.  Consultants:  None  Procedures:  None  Antibiotics:  IV Levaquin day 2  HPI/Subjective: Patient is up-to-date so much better. She is alert  and oriented x3 this morning. She feels very tired, but better than she has before. Some mild soreness especially on her backside where she fell a few weeks back, but otherwise no complaints.  Objective: Filed Vitals:   01/24/12 0413 01/24/12 0414 01/24/12 0416 01/24/12 1400  BP: 125/44 138/60 90/48 112/64  Pulse: 60 65 73 57  Temp: 98.6 F (37 C)   98.6 F (37 C)  TempSrc: Oral   Oral  Resp:    18  Height:      Weight:      SpO2: 93%   92%    Intake/Output Summary (Last 24 hours) at 01/24/12 1553 Last data filed at 01/24/12 0700  Gross per 24 hour  Intake      0 ml  Output    900 ml  Net   -900 ml   Filed Weights   01/23/12 1344 01/23/12 1615 01/24/12 0300  Weight: 52.164 kg (115 lb) 81.647 kg (180 lb) 84.006 kg (185 lb 3.2 oz)    Exam:   General:  Alert and oriented x3, appropriate, fatigue, looks about stated age  HEENT: Normocephalic, atraumatic, mucous per minute slightly dry  Cardiovascular: Regular rate and rhythm, S1-S2  Respiratory: Clear to auscultation bilaterally  Abdomen: Abdomen soft, nontender, nondistended, hypoactive bowel sounds  Extremities: No clubbing or cyanosis, trace pitting edema  Data Reviewed: Basic Metabolic Panel:  Lab 01/24/12 8413 01/23/12 1221  NA 141 140  K 4.2 4.5  CL 109 106  CO2 20 20  GLUCOSE 94 115*  BUN 22 27*  CREATININE 1.31* 1.17*  CALCIUM 9.1 9.9  MG -- --  PHOS -- --   Liver Function Tests:  Lab 01/24/12 0426  AST  13  ALT 9  ALKPHOS 60  BILITOT 0.1*  PROT 6.1  ALBUMIN 2.7*   CBC:  Lab 01/24/12 0426 01/23/12 1221  WBC 12.0* 13.2*  NEUTROABS -- 10.2*  HGB 10.9* 13.3  HCT 33.4* 41.0  MCV 89.5 89.7  PLT 224 247   CBG:  Lab 01/24/12 1113 01/24/12 0733 01/24/12 0132 01/23/12 2205 01/23/12 1813  GLUCAP 108* 106* 105* 103* 155*    No results found for this or any previous visit (from the past 240 hour(s)).   Studies: Dg Chest 2 View  01/23/2012  *RADIOLOGY REPORT*  Clinical Data: Mild cough.   Smoker.  CHEST - 2 VIEW  Comparison: 06/08/2009.  Findings: Decreased inspiration.  Borderline enlarged cardiac silhouette.  Clear lungs with normal vascularity.  Stable mildly prominent interstitial markings.  Cervical spine fixation hardware.  IMPRESSION: Stable mild chronic interstitial lung disease, compatible with the history of smoking.  No acute abnormality.   Original Report Authenticated By: Darrol Angel, M.D.    Dg Lumbar Spine Complete  01/23/2012  *RADIOLOGY REPORT*  Clinical Data: Fall.  Low back pain.  Acute mental status changes.  LUMBAR SPINE - COMPLETE 4+ VIEW  Comparison: Lumbar spine x-rays 05/18/2005.  Findings: Five non-rib bearing lumbar vertebrae with anatomic alignment.  No fractures.  Moderate disc space narrowing at L3-4 and mild disc space narrowing at L1-2 and L2-3, progressive since the prior examination.  Extensive lower thoracic spondylosis, also progressive.  No pars defects.  Diffuse facet degenerative changes. Visualized sacroiliac joints intact.  Right common iliac artery stent.  IMPRESSION: No acute osseous abnormality.  Progressive degenerative disc disease and spondylosis at L1-2, L2-3, and L3-4 since 2007 (worst at L3-4).  Diffuse facet degenerative changes.   Original Report Authenticated By: Arnell Sieving, M.D.    Dg Sacrum/coccyx  01/24/2012  IMPRESSION: No acute bony pathology.   Original Report Authenticated By: Donavan Burnet, M.D.    Dg Hip Complete Left  01/23/2012   IMPRESSION: No acute osseous abnormality.   Original Report Authenticated By: Arnell Sieving, M.D.     Scheduled Meds:   . acetaminophen  650 mg Oral Once  . amitriptyline  25 mg Oral QHS  . beta carotene w/minerals  1 tablet Oral Daily  . carvedilol  25 mg Oral BID WC  . heparin  5,000 Units Subcutaneous Q8H  . levofloxacin (LEVAQUIN) IV  500 mg Intravenous Q48H  . levothyroxine  50 mcg Oral Daily  . sodium chloride  3 mL Intravenous Q12H  . sulfamethoxazole-trimethoprim  10  mg/kg Intravenous Once  . DISCONTD: a stronger pump book   Does not apply Once  . DISCONTD: amLODipine  10 mg Oral Daily  . DISCONTD: cloNIDine  0.1 mg Oral BID  . DISCONTD: sulfamethoxazole-trimethoprim  10 mg/kg Intravenous Once   Continuous Infusions:   . sodium chloride    . sodium chloride 125 mL/hr (01/24/12 1404)  . DISCONTD: sodium chloride 125 mL/hr at 01/23/12 1313  . DISCONTD: sodium chloride 50 mL/hr at 01/23/12 2241     Time spent: 35 minutes    Hollice Espy  [facility] Pager [phone]. If 8PM-8AM, please contact night-coverage at [email or web address], password [facility] 01/24/2012, 3:53 PM  LOS: 1 day

## 2012-01-24 NOTE — Care Management Note (Unsigned)
    Page 1 of 1   01/26/2012     11:21:09 AM   CARE MANAGEMENT NOTE 01/26/2012  Patient:  [name], [name]   Account Number:  [id number]  Date Initiated:  01/24/2012  Documentation initiated by:  GRAVES-BIGELOW,Jaxxen Voong  Subjective/Objective Assessment:   Pt admitted with AMS. grandson lives with pt.     Action/Plan:   Pt will benefit from [facility] RN for medication managment and v/s checks. CM will continue to f/u for disposition needs.   Anticipated DC Date:  01/24/2012   Anticipated DC Plan:  HOME W HOME HEALTH SERVICES      DC Planning Services  CM consult      Choice offered to / List presented to:             Status of service:  In process, will continue to follow Medicare Important Message given?   (If response is "NO", the following Medicare IM given date fields will be blank) Date Medicare IM given:   Date Additional Medicare IM given:    Discharge Disposition:    Per UR Regulation:  Reviewed for med. necessity/level of care/duration of stay  If discussed at Long Length of Stay Meetings, dates discussed:    Comments:  01-26-12 [address] [place], [place] [phone] CM spoke to pt's daughter and the plan is now for ALF at [facility] will still f/u and CM made CSW and MD aware fo plan. Plan is for D/C in am. PT to ambulate pt today will f/u with recommendations.   01-25-12 [address], [place] [phone] CM did speak to pt and she states she has an aide that comes out 2.5 hours per day 5 days wk. Unable to give me the name fof agency for aide. Pt states she has DME RW, Cane and motorized wc at home. She states her grandson prepares meals for her. Pt will benefit for [facility] RN if the plan is for home for medication and disease management. PT to evaluate this evening and CM will monitor for recommendations.

## 2012-01-24 NOTE — Progress Notes (Signed)
[facility] follow-up progress note (same day) CXr neg Continue levaquin  Pleas Koch, MD [facility] [phone]

## 2012-01-24 NOTE — Progress Notes (Signed)
Patient's oxygen saturations reading 89 to 90% on RA. Patient placed on 2 LN/C and oxygen saturations increased to 94%. Will continue to monitor patient.

## 2012-01-25 DIAGNOSIS — R799 Abnormal finding of blood chemistry, unspecified: Secondary | ICD-10-CM

## 2012-01-25 DIAGNOSIS — R0902 Hypoxemia: Secondary | ICD-10-CM

## 2012-01-25 DIAGNOSIS — R4182 Altered mental status, unspecified: Secondary | ICD-10-CM

## 2012-01-25 DIAGNOSIS — R7989 Other specified abnormal findings of blood chemistry: Secondary | ICD-10-CM

## 2012-01-25 LAB — URINALYSIS, ROUTINE W REFLEX MICROSCOPIC
Bilirubin Urine: NEGATIVE
Ketones, ur: 15 mg/dL — AB
Protein, ur: 100 mg/dL — AB
Urobilinogen, UA: 1 mg/dL (ref 0.0–1.0)

## 2012-01-25 LAB — URINE MICROSCOPIC-ADD ON

## 2012-01-25 LAB — URINE CULTURE
Colony Count: NO GROWTH
Culture: NO GROWTH

## 2012-01-25 LAB — GLUCOSE, CAPILLARY
Glucose-Capillary: 115 mg/dL — ABNORMAL HIGH (ref 70–99)
Glucose-Capillary: 206 mg/dL — ABNORMAL HIGH (ref 70–99)

## 2012-01-25 MED ORDER — AMLODIPINE BESYLATE 10 MG PO TABS
10.0000 mg | ORAL_TABLET | Freq: Every day | ORAL | Status: DC
  Administered 2012-01-25 – 2012-01-27 (×3): 10 mg via ORAL
  Filled 2012-01-25 (×3): qty 1

## 2012-01-25 MED ORDER — POLYETHYLENE GLYCOL 3350 17 G PO PACK
68.0000 g | PACK | Freq: Once | ORAL | Status: AC
  Administered 2012-01-25: 68 g via ORAL
  Filled 2012-01-25 (×2): qty 4

## 2012-01-25 MED ORDER — LEVOFLOXACIN 500 MG PO TABS
500.0000 mg | ORAL_TABLET | ORAL | Status: DC
  Administered 2012-01-25: 500 mg via ORAL
  Filled 2012-01-25 (×2): qty 1

## 2012-01-25 MED ORDER — BISACODYL 5 MG PO TBEC
10.0000 mg | DELAYED_RELEASE_TABLET | Freq: Once | ORAL | Status: AC
  Administered 2012-01-25: 10 mg via ORAL
  Filled 2012-01-25: qty 2

## 2012-01-25 MED ORDER — HYDRALAZINE HCL 20 MG/ML IJ SOLN
10.0000 mg | INTRAMUSCULAR | Status: DC | PRN
  Administered 2012-01-27: 10 mg via INTRAVENOUS
  Filled 2012-01-25: qty 1

## 2012-01-25 MED ORDER — CLONIDINE HCL 0.1 MG PO TABS
0.1000 mg | ORAL_TABLET | Freq: Two times a day (BID) | ORAL | Status: DC
  Administered 2012-01-25 (×2): 0.1 mg via ORAL
  Filled 2012-01-25 (×4): qty 1

## 2012-01-25 MED ORDER — HYDROCHLOROTHIAZIDE 12.5 MG PO CAPS
25.0000 mg | ORAL_CAPSULE | Freq: Every day | ORAL | Status: DC
  Administered 2012-01-25 – 2012-01-27 (×3): 25 mg via ORAL
  Filled 2012-01-25 (×3): qty 2

## 2012-01-25 NOTE — Progress Notes (Addendum)
[facility] PROGRESS NOTE  EDIT RICCIARDELLI ZOX:[id number] DOB: 1934-11-11 DOA: 01/23/2012 PCP: Lupita Raider, MD  Assessment/Plan: Principal Problem:   *Pyelonephritis:  Cultures negative, however, patient had too numerous to count WBC and many bacteria, a significant change from her baseline UA.   -  Continue levaquin -  Check UA today to see if WBC tr at discharge if improved:  Improved  Hypotension:  Resolved  And patient now hypertensive.    HYPERTENSION: Patient presented with elevated blood pressures requiring resumption of her home medications plus additional. She then became borderline hypotensive which improved with holding her clonidine and diuretic and given IVF. Patient's blood pressure trended up this morning so restarted clonidine, however, now her pressures are even more elevated. -  Restart amlodipine and HCTZ, first dose now. -  Hydralazine prn for SBP>180  Desaturation overnight:    No evidence of pneumonia on exam.  May be due to atelectasis from lying in bed. -  OOB -  Incentive spirometry -  CXR:  stable interstitial disease.   Creatinine rising:  May be due to mild ATN from relative hypotension.   -  Trend BMP.  Active Problems:  HYPOTHYROIDISM: Stable.   DM: Sugars here have been fine. A1c at 7.   HYPERCHOLESTEROLEMIA: Stable.   CAD: Stable.   COPD: Stable. Breathing comfortably.  Status post recent fall: No fractures.   Lumbago: Pain control   Code Status: Full Family Communication: Discussed plan with patient at bedside Disposition Plan: Awaiting PT recs, improvement in oxygen saturation and blood pressure more stable.     Brief narrative: 77 year old white female passed a history of hypertension, diabetes and COPD who presented with altered mental status and has been having hypoglycemic events for the past few months. She was brought in by her family after having more confusion and the patient reports she thinks her blood sugars were  low in the early morning of 9/9. In emergency room she is on a white count of 13.2 and a very large urinary tract infection we'll mildly elevated BUN and creatinine. She was little confused on admission but she she's never had altered mental status before) reportedly she had a fall recently in the past few weeks. X-rays were unremarkable for any fractures. Patient was admitted for treatment of pyelonephritis.  Consultants:  None  Procedures:  None  Antibiotics:  IV Levaquin day 2  HPI/Subjective: Patient states she feels better. She has some left sided SI joint tenderness and feels weak.  She denies chest pain, shortness of breath.  She feels constipated and has not been eating well.   Objective: Filed Vitals:   01/24/12 0416 01/24/12 1400 01/24/12 2100 01/25/12 0500  BP: 90/48 112/64 141/65 149/42  Pulse: 73 57 62 63  Temp:  98.6 F (37 C) 98.6 F (37 C) 98.4 F (36.9 C)  TempSrc:  Oral    Resp:  18 18 18   Height:      Weight:      SpO2:  92% 90% 96%    Intake/Output Summary (Last 24 hours) at 01/25/12 0829 Last data filed at 01/24/12 1831  Gross per 24 hour  Intake      0 ml  Output    700 ml  Net   -700 ml   Filed Weights   01/23/12 1344 01/23/12 1615 01/24/12 0300  Weight: 52.164 kg (115 lb) 81.647 kg (180 lb) 84.006 kg (185 lb 3.2 oz)    Exam:   General:  No acute distress  Psych:  Alert and oriented x3, appropriate  HEENT: Normocephalic, atraumatic, mucous membranes moist  Cardiovascular: Regular rate and rhythm, S1-S2  Respiratory: Clear to auscultation bilaterally with rales at bases that cleared with repeated respirations  Abdomen: Abdomen soft, nontender, nondistended, hypoactive bowel sounds  Extremities: No clubbing or cyanosis, trace pitting edema  Left SI joint tender without swelling or ecchymosis.  Data Reviewed: Basic Metabolic Panel:  Lab 01/24/12 6578 01/23/12 1221  NA 141 140  K 4.2 4.5  CL 109 106  CO2 20 20  GLUCOSE 94 115*    BUN 22 27*  CREATININE 1.31* 1.17*  CALCIUM 9.1 9.9  MG -- --  PHOS -- --   Liver Function Tests:  Lab 01/24/12 0426  AST 13  ALT 9  ALKPHOS 60  BILITOT 0.1*  PROT 6.1  ALBUMIN 2.7*   CBC:  Lab 01/24/12 0426 01/23/12 1221  WBC 12.0* 13.2*  NEUTROABS -- 10.2*  HGB 10.9* 13.3  HCT 33.4* 41.0  MCV 89.5 89.7  PLT 224 247   CBG:  Lab 01/25/12 0712 01/24/12 2118 01/24/12 1636 01/24/12 1113 01/24/12 0733  GLUCAP 115* 156* 133* 108* 106*    Recent Results (from the past 240 hour(s))  URINE CULTURE     Status: Normal   Collection Time   01/23/12 12:35 PM      Component Value Range Status Comment   Specimen Description URINE, CATHETERIZED   Final    Special Requests ADDED 01/23/12 1825   Final    Culture  Setup Time 01/24/2012 01:38   Final    Colony Count NO GROWTH   Final    Culture NO GROWTH   Final    Report Status 01/24/2012 FINAL   Final   URINE CULTURE     Status: Normal   Collection Time   01/24/12  1:39 AM      Component Value Range Status Comment   Specimen Description URINE, CATHETERIZED   Final    Special Requests NONE   Final    Culture  Setup Time 01/24/2012 08:00   Final    Colony Count NO GROWTH   Final    Culture NO GROWTH   Final    Report Status 01/25/2012 FINAL   Final      Studies: Dg Chest 2 View  01/23/2012  *RADIOLOGY REPORT*  Clinical Data: Mild cough.  Smoker.  CHEST - 2 VIEW  Comparison: 06/08/2009.  Findings: Decreased inspiration.  Borderline enlarged cardiac silhouette.  Clear lungs with normal vascularity.  Stable mildly prominent interstitial markings.  Cervical spine fixation hardware.  IMPRESSION: Stable mild chronic interstitial lung disease, compatible with the history of smoking.  No acute abnormality.   Original Report Authenticated By: Darrol Angel, M.D.    Dg Lumbar Spine Complete  01/23/2012  *RADIOLOGY REPORT*  Clinical Data: Fall.  Low back pain.  Acute mental status changes.  LUMBAR SPINE - COMPLETE 4+ VIEW  Comparison: Lumbar  spine x-rays 05/18/2005.  Findings: Five non-rib bearing lumbar vertebrae with anatomic alignment.  No fractures.  Moderate disc space narrowing at L3-4 and mild disc space narrowing at L1-2 and L2-3, progressive since the prior examination.  Extensive lower thoracic spondylosis, also progressive.  No pars defects.  Diffuse facet degenerative changes. Visualized sacroiliac joints intact.  Right common iliac artery stent.  IMPRESSION: No acute osseous abnormality.  Progressive degenerative disc disease and spondylosis at L1-2, L2-3, and L3-4 since 2007 (worst at L3-4).  Diffuse facet degenerative changes.  Original Report Authenticated By: Arnell Sieving, M.D.    Dg Sacrum/coccyx  01/24/2012  IMPRESSION: No acute bony pathology.   Original Report Authenticated By: Donavan Burnet, M.D.    Dg Hip Complete Left  01/23/2012   IMPRESSION: No acute osseous abnormality.   Original Report Authenticated By: Arnell Sieving, M.D.     Scheduled Meds:    . amitriptyline  25 mg Oral QHS  . beta carotene w/minerals  1 tablet Oral Daily  . carvedilol  25 mg Oral BID WC  . heparin  5,000 Units Subcutaneous Q8H  . levofloxacin (LEVAQUIN) IV  500 mg Intravenous Q48H  . levothyroxine  50 mcg Oral Daily  . sodium chloride  3 mL Intravenous Q12H  . DISCONTD: a stronger pump book   Does not apply Once  . DISCONTD: amLODipine  10 mg Oral Daily  . DISCONTD: cloNIDine  0.1 mg Oral BID   Continuous Infusions:    . sodium chloride    . sodium chloride 125 mL/hr (01/24/12 1404)     Time spent: 35 minutes    Azrielle Springsteen, [facility]  [facility] Pager [phone]. If 8PM-8AM, please contact night-coverage at [email or web address], password [facility] 01/25/2012, 8:29 AM  LOS: 2 days

## 2012-01-25 NOTE — Progress Notes (Signed)
Thank you to Tomi Bamberger for this referral.  Patient evaluated for community based chronic disease management services with [facility] Care Management Program as a benefit of patient's [employer]. Patient will receive a post discharge transition of care call and will be evaluated for monthly home visits for assessments and disease process education. Spoke with patient at bedside to explain [facility] Care Management services. Left contact information and [facility] literature at bedside.  Consents obtained.  Initial care focus will be on diabetes and UTI treatment adherence.  Requested telehealth to establish CBG compliance at home and assist in actively educating the family/patient the importance of dietary compliance. Reinforced the importance of completing all antibiotics at home as orders to reduce risk of UTI reoccurrence.  Patient voiced understanding.   Made inpatient Case Manager, Tomi Bamberger, aware of recommendations and that [facility] Care Management is following.  Of note, [facility] Care Management services does not replace or interfere with any services that are arranged by inpatient case management or social work.  For additional questions or referrals please contact Anibal Henderson BSN RN [facility] Liaison at [phone].

## 2012-01-25 NOTE — Progress Notes (Signed)
Pt too weak to ambulate, vitals signs stables. States that she is weak because she is not eating and has no appetite.

## 2012-01-26 DIAGNOSIS — J441 Chronic obstructive pulmonary disease with (acute) exacerbation: Secondary | ICD-10-CM

## 2012-01-26 LAB — CBC
MCHC: 32.7 g/dL (ref 30.0–36.0)
Platelets: 211 10*3/uL (ref 150–400)
RDW: 12.9 % (ref 11.5–15.5)

## 2012-01-26 LAB — BASIC METABOLIC PANEL
GFR calc Af Amer: 54 mL/min — ABNORMAL LOW (ref >90)
GFR calc non Af Amer: 47 mL/min — ABNORMAL LOW (ref >90)
Potassium: 4.9 mEq/L (ref 3.5–5.1)
Sodium: 140 mEq/L (ref 135–145)

## 2012-01-26 LAB — HEMOGLOBIN A1C: Hgb A1c MFr Bld: 6.8 % — ABNORMAL HIGH (ref <5.7)

## 2012-01-26 LAB — GLUCOSE, CAPILLARY: Glucose-Capillary: 157 mg/dL — ABNORMAL HIGH (ref 70–99)

## 2012-01-26 MED ORDER — ALBUTEROL SULFATE (5 MG/ML) 0.5% IN NEBU: 2.5000 mg | INHALATION_SOLUTION | RESPIRATORY_TRACT | Status: DC

## 2012-01-26 MED ORDER — INSULIN ASPART 100 UNIT/ML ~~LOC~~ SOLN
0.0000 [IU] | Freq: Every day | SUBCUTANEOUS | Status: DC
  Administered 2012-01-26: 3 [IU] via SUBCUTANEOUS

## 2012-01-26 MED ORDER — IPRATROPIUM BROMIDE 0.02 % IN SOLN
0.5000 mg | RESPIRATORY_TRACT | Status: DC
  Administered 2012-01-26: 0.5 mg via RESPIRATORY_TRACT
  Filled 2012-01-26 (×2): qty 2.5

## 2012-01-26 MED ORDER — ALBUTEROL SULFATE (5 MG/ML) 0.5% IN NEBU
2.5000 mg | INHALATION_SOLUTION | RESPIRATORY_TRACT | Status: DC
  Administered 2012-01-26: 2.5 mg via RESPIRATORY_TRACT
  Filled 2012-01-26 (×2): qty 0.5

## 2012-01-26 MED ORDER — BISACODYL 5 MG PO TBEC
10.0000 mg | DELAYED_RELEASE_TABLET | Freq: Once | ORAL | Status: AC
  Administered 2012-01-26: 10 mg via ORAL
  Filled 2012-01-26: qty 2

## 2012-01-26 MED ORDER — IPRATROPIUM BROMIDE 0.02 % IN SOLN: 0.5000 mg | RESPIRATORY_TRACT | Status: DC | PRN

## 2012-01-26 MED ORDER — POLYETHYLENE GLYCOL 3350 17 G PO PACK
68.0000 g | PACK | Freq: Once | ORAL | Status: DC
  Filled 2012-01-26: qty 4

## 2012-01-26 MED ORDER — INSULIN GLARGINE 100 UNIT/ML ~~LOC~~ SOLN
10.0000 [IU] | Freq: Every day | SUBCUTANEOUS | Status: DC
  Administered 2012-01-26: 10 [IU] via SUBCUTANEOUS

## 2012-01-26 MED ORDER — INSULIN ASPART 100 UNIT/ML ~~LOC~~ SOLN
0.0000 [IU] | Freq: Three times a day (TID) | SUBCUTANEOUS | Status: DC
  Administered 2012-01-26 (×2): 3 [IU] via SUBCUTANEOUS
  Administered 2012-01-27: 5 [IU] via SUBCUTANEOUS
  Administered 2012-01-27: 3 [IU] via SUBCUTANEOUS

## 2012-01-26 MED ORDER — CLONIDINE HCL 0.2 MG PO TABS
0.2000 mg | ORAL_TABLET | Freq: Two times a day (BID) | ORAL | Status: DC
  Administered 2012-01-26 – 2012-01-27 (×3): 0.2 mg via ORAL
  Filled 2012-01-26 (×4): qty 1

## 2012-01-26 MED ORDER — ALBUTEROL SULFATE (5 MG/ML) 0.5% IN NEBU: 2.5000 mg | INHALATION_SOLUTION | RESPIRATORY_TRACT | Status: DC | PRN

## 2012-01-26 MED ORDER — FUROSEMIDE 10 MG/ML IJ SOLN
20.0000 mg | Freq: Once | INTRAMUSCULAR | Status: AC
  Administered 2012-01-26: 20 mg via INTRAVENOUS
  Filled 2012-01-26: qty 2

## 2012-01-26 MED ORDER — PREDNISONE 20 MG PO TABS
40.0000 mg | ORAL_TABLET | Freq: Every day | ORAL | Status: DC
  Administered 2012-01-26 – 2012-01-27 (×2): 40 mg via ORAL
  Filled 2012-01-26 (×3): qty 2

## 2012-01-26 NOTE — Clinical Social Work Psychosocial (Signed)
     Clinical Social Work Department BRIEF PSYCHOSOCIAL ASSESSMENT 01/26/2012  Patient:  [name], [name]     Account Number:  [id number]     Admit date:  01/23/2012  Clinical Social Worker:  Margaree Mackintosh  Date/Time:  01/26/2012 02:58 PM  Referred by:  Physician  Date Referred:  01/26/2012 Referred for  ALF Placement   Other Referral:   Interview type:  Patient Other interview type:   Spoke with son via telephone.    PSYCHOSOCIAL DATA Living Status:  FAMILY Admitted from facility:   Level of care:   Primary support name:  [name]: [phone] Primary support relationship to patient:  CHILD, ADULT Degree of support available:   Adequate.    CURRENT CONCERNS Current Concerns  Post-Acute Placement   Other Concerns:    SOCIAL WORK ASSESSMENT / PLAN Clinical Social Worker recieved referral to assist with post acute placement.  CSW reviewed chart and attempted to meet with pt at bedside.  CSW introduced self, explained role, and provided support.  Pt was oriented times three however pt did state that she saw "a little boy by the door".  There was nobody by the door.  CSW phoned pt's son. CSW introduced self, explained role, and provided support. Son stated that he has been working with [facility] to sign contract work for pt to [employer] to ALF at [employer].  Per son, ALF will provide PT to pt.  CSW contacted ALF who confirmed they are working with pt and family to accept pt at dc. ALF states they are working with Anibal Henderson to assist with this process.  CSW left message with Anibal Henderson. CSW to continue to follow and assist as needed.   Assessment/plan status:  Psychosocial Support/Ongoing Assessment of Needs Other assessment/ plan:   Information/referral to community resources:   [facility]F.    PATIENTS/FAMILYS RESPONSE TO PLAN OF CARE: Pt was pleasant though appears to have some visual hallucinations.  Family was pleasant and engaged in conversation.

## 2012-01-26 NOTE — Progress Notes (Signed)
Inpatient Diabetes Program Recommendations  AACE/ADA: New Consensus Statement on Inpatient Glycemic Control (2013)  Target Ranges:  Prepandial:   less than 140 mg/dL      Peak postprandial:   less than 180 mg/dL (1-2 hours)      Critically ill patients:  140 - 180 mg/dL   Reason for Visit: Results for DEADRA, DIGGINS (MRN [id number]) as of 01/26/2012 09:57  Ref. Range 01/25/2012 07:12 01/25/2012 11:24 01/25/2012 16:51 01/25/2012 21:41 01/26/2012 07:35  Glucose-Capillary Latest Range: 70-99 mg/dL 433 (H) 295 (H) 188 (H) 187 (H) 157 (H)   Note history of diabetes.  A1C=7.0%.  According to medication reconciliation, patient was on Levemir 50 to 60 units bid and Humalog.  Patient currently on no insulin in the hospital.  Consider adding moderate Novolog correction tid with meals and HS.  Also may consider adding Levemir 15 units once a day.  Will follow.

## 2012-01-26 NOTE — Evaluation (Signed)
Physical Therapy Evaluation Patient Details Name: [name] MRN: [id number] DOB: June 02, 1934 Today's Date: 01/26/2012 Time: 4098-1191 PT Time Calculation (min): 31 min  PT Assessment / Plan / Recommendation Clinical Impression  pt presents with Pyelonephritis and HTN.  pt generally weaka nd deconditioned requiring A for aspects of mobility.  Per pt and Daughter plan is to D/C to [facility]F, however feel pt would be better served at [facility] level of care.  Discussed this with pt, however daughter had left to go to work at beginning of session.  pt says she will discuss this with her daughter.      PT Assessment  Patient needs continued PT services    Follow Up Recommendations  Skilled nursing facility    Barriers to Discharge None      Equipment Recommendations  None recommended by PT    Recommendations for Other Services OT consult   Frequency Min 3X/week    Precautions / Restrictions Precautions Precautions: Fall Restrictions Weight Bearing Restrictions: No   Pertinent Vitals/Pain Denies pain.        Mobility  Bed Mobility Bed Mobility: Supine to Sit;Sitting - Scoot to Edge of Bed Supine to Sit: 4: Min assist Sitting - Scoot to [place] of Bed: 3: Mod assist Details for Bed Mobility Assistance: cues for sequencing, Transfers Transfers: Sit to Stand;Stand to Sit Sit to Stand: 4: Min assist;With upper extremity assist;From bed Stand to Sit: 4: Min assist;With upper extremity assist;To chair/3-in-1 Details for Transfer Assistance: cues for use of UEs, pt starts to sit before she is all the way to the chair.  Strong cueing for safety.   Ambulation/Gait Ambulation/Gait Assistance: 4: Min guard Ambulation Distance (Feet): 10 Feet Assistive device: Rolling walker Ambulation/Gait Assistance Details: pt moves slowly and with flexed posture.  cues for safe use of RW and encouragement Gait Pattern: Step-through pattern;Decreased stride length;Trunk flexed Stairs:  No Wheelchair Mobility Wheelchair Mobility: No    Exercises     PT Diagnosis: Difficulty walking;Generalized weakness  PT Problem List: Decreased strength;Decreased activity tolerance;Decreased balance;Decreased mobility;Decreased knowledge of use of DME;Decreased safety awareness PT Treatment Interventions: DME instruction;Gait training;Stair training;Functional mobility training;Therapeutic activities;Therapeutic exercise;Balance training;Patient/family education   PT Goals Acute Rehab PT Goals PT Goal Formulation: With patient Time For Goal Achievement: 02/09/12 Potential to Achieve Goals: Good Pt will go Supine/Side to Sit: with modified independence PT Goal: Supine/Side to Sit - Progress: Goal set today Pt will go Sit to Supine/Side: with modified independence PT Goal: Sit to Supine/Side - Progress: Goal set today Pt will go Sit to Stand: with supervision PT Goal: Sit to Stand - Progress: Goal set today Pt will go Stand to Sit: with supervision PT Goal: Stand to Sit - Progress: Goal set today Pt will Ambulate: >150 feet;with supervision;with rolling walker PT Goal: Ambulate - Progress: Goal set today  Visit Information  Last PT Received On: 01/26/12 Assistance Needed: +1    Subjective Data  Subjective: The plan is for her to go to an ALF for a couple months before going home.   Patient Stated Goal: Walk better   Prior Functioning  Home Living Lives With: Alone Available Help at Discharge: Family;Available PRN/intermittently Type of Home: House Home Adaptive Equipment: Walker - rolling;Bedside commode/3-in-1;Electric Scooter;Shower chair with back Additional Comments: pt plans to D/C to [facility]F.   Prior Function Level of Independence: Needs assistance Needs Assistance: Dressing;Meal Prep;Light Housekeeping Dressing: Minimal Meal Prep: Maximal Light Housekeeping: Maximal Able to Take Stairs?: Yes (With A  only.) Driving: No Vocation:  Retired Musician: No difficulties Dominant Hand: Right    Cognition  Overall Cognitive Status: Appears within functional limits for tasks assessed/performed Arousal/Alertness: Awake/alert Orientation Level: Appears intact for tasks assessed Behavior During Session: [facility] for tasks performed    Extremity/Trunk Assessment Right Lower Extremity Assessment RLE ROM/Strength/Tone: Deficits RLE ROM/Strength/Tone Deficits: Generally weak and deconditioned Left Lower Extremity Assessment LLE ROM/Strength/Tone: Deficits LLE ROM/Strength/Tone Deficits: Generally weak and deconditioned Trunk Assessment Trunk Assessment: Normal   Balance Balance Balance Assessed: Yes Static Standing Balance Static Standing - Balance Support: Bilateral upper extremity supported Static Standing - Level of Assistance: 5: Stand by assistance Static Standing - Comment/# of Minutes: pt shakey during standing and requires RW and MinGuard to maintain safe balance.    End of Session PT - End of Session Equipment Utilized During Treatment: Gait belt Activity Tolerance: Patient limited by fatigue Patient left: in chair;with call bell/phone within reach Nurse Communication: Mobility status  GP     [name],  [phone] 01/26/2012, 12:41 PM

## 2012-01-26 NOTE — Progress Notes (Signed)
[facility] PROGRESS NOTE  [name] NFA:[id number] DOB: February 18, 1935 DOA: 01/23/2012 PCP: Lupita Raider, MD  Assessment/Plan: Principal Problem:   *Pyelonephritis:  Cultures negative, however, patient had too numerous to count WBC and many bacteria, a significant change from her baseline UA.  Repeat UA with significantly decreased WBC.   -  Continue levaquin x 7 days   HYPERTENSION: Patient presented with elevated blood pressures requiring resumption of her home medications plus additional. She then became hypotensive which improved with holding her clonidine and diuretic and given IVF. Patient's blood pressure trended up on 9/11 so restarted clonidine, norvasc and HCTZ, however, now her pressures are continue to be elevated.   -  Increase clonidine to 0.2 BID -  Hydralazine prn for SBP>180  Acute exacerbation of COPD with desaturation overnight on 9/11:    No evidence of pneumonia on exam.   CXR:  stable interstitial disease.  -  Add on pro BNP -  Incentive spirometry -  Prednisone 40mg  po daily x 2 weeks, first dose no 9/12 -  Duonebs q4h with albuterol 2.5mg  neg q2h prn sob, wheeze -  Already on levaquin  Creatinine rose on 9/11, but trended back down on 9/12:  May be due to mild ATN from relative hypotension.   -  Trend BMP.  Active Problems:  HYPOTHYROIDISM: Stable.   DM: Sugars here have been fine. A1c at 7. -  Start lantus 10 units with SSI because starting prednisone today   HYPERCHOLESTEROLEMIA: Stable.   CAD: Stable.  Status post recent fall: No fractures.   Lumbago: Pain control  DIET:  Healthy heart, diabetic ACCESS:  PIV PROPH:  Heparin   Code Status: Full Family Communication: Discussed plan with patient and daughter and son-in-law at bedside Disposition Plan: Awaiting PT recs, possible discharge to [facility] tomorrow if blood pressure and breathing improved.    Brief narrative: 77 year old white female passed a history of hypertension,  diabetes and COPD who presented with altered mental status and has been having hypoglycemic events for the past few months. She was brought in by her family after having more confusion and the patient reports she thinks her blood sugars were low in the early morning of 9/9. In emergency room she is on a white count of 13.2 and a very large urinary tract infection we'll mildly elevated BUN and creatinine. She was little confused on admission but she she's never had altered mental status before) reportedly she had a fall recently in the past few weeks. X-rays were unremarkable for any fractures. Patient was admitted for treatment of pyelonephritis.  Consultants:  None  Procedures:  None  Antibiotics:  IV Levaquin day 3  HPI/Subjective:  Patient chest pain, but now feels wheezy and [name] of breath.  She feels constipated and continues to not eat well and feel weak.  Denies face and tongue swelling or throat closing sensation  Objective: Filed Vitals:   01/25/12 2100 01/25/12 2203 01/26/12 0500 01/26/12 1036  BP: 164/72 165/66 180/69 173/72  Pulse: 70  76   Temp: 99.1 F (37.3 C)  98.3 F (36.8 C)   TempSrc:      Resp: 20  18   Height:      Weight:      SpO2: 93%  94%     Intake/Output Summary (Last 24 hours) at 01/26/12 1157 Last data filed at 01/25/12 2204  Gross per 24 hour  Intake      3 ml  Output  0 ml  Net      3 ml   Filed Weights   01/23/12 1344 01/23/12 1615 01/24/12 0300  Weight: 52.164 kg (115 lb) 81.647 kg (180 lb) 84.006 kg (185 lb 3.2 oz)    Exam:   General:  No acute distress  Psych:  Alert and oriented x3, appropriate  HEENT: Normocephalic, atraumatic, mucous membranes moist  Cardiovascular: Regular rate and rhythm, S1-S2  Respiratory:  I:E 1:4, full expiratory moderate pitched wheeze.  Abdomen: Abdomen soft, nontender, nondistended, normal active bowel sounds  Extremities: No clubbing or cyanosis, trace pitting edema  Data  Reviewed: Basic Metabolic Panel:  Lab 01/26/12 1610 01/24/12 0426 01/23/12 1221  NA 140 141 140  K 4.9 4.2 4.5  CL 110 109 106  CO2 17* 20 20  GLUCOSE 144* 94 115*  BUN 21 22 27*  CREATININE 1.13* 1.31* 1.17*  CALCIUM 9.3 9.1 9.9  MG -- -- --  PHOS -- -- --   Liver Function Tests:  Lab 01/24/12 0426  AST 13  ALT 9  ALKPHOS 60  BILITOT 0.1*  PROT 6.1  ALBUMIN 2.7*   CBC:  Lab 01/26/12 0500 01/24/12 0426 01/23/12 1221  WBC 9.8 12.0* 13.2*  NEUTROABS -- -- 10.2*  HGB 11.1* 10.9* 13.3  HCT 33.9* 33.4* 41.0  MCV 87.8 89.5 89.7  PLT 211 224 247   CBG:  Lab 01/26/12 0735 01/25/12 2141 01/25/12 1651 01/25/12 1124 01/25/12 0712  GLUCAP 157* 187* 206* 148* 115*    Recent Results (from the past 240 hour(s))  URINE CULTURE     Status: Normal   Collection Time   01/23/12 12:35 PM      Component Value Range Status Comment   Specimen Description URINE, CATHETERIZED   Final    Special Requests ADDED 01/23/12 1825   Final    Culture  Setup Time 01/24/2012 01:38   Final    Colony Count NO GROWTH   Final    Culture NO GROWTH   Final    Report Status 01/24/2012 FINAL   Final   URINE CULTURE     Status: Normal   Collection Time   01/24/12  1:39 AM      Component Value Range Status Comment   Specimen Description URINE, CATHETERIZED   Final    Special Requests NONE   Final    Culture  Setup Time 01/24/2012 08:00   Final    Colony Count NO GROWTH   Final    Culture NO GROWTH   Final    Report Status 01/25/2012 FINAL   Final      Studies: Dg Chest 2 View  01/23/2012  *RADIOLOGY REPORT*  Clinical Data: Mild cough.  Smoker.  CHEST - 2 VIEW  Comparison: 06/08/2009.  Findings: Decreased inspiration.  Borderline enlarged cardiac silhouette.  Clear lungs with normal vascularity.  Stable mildly prominent interstitial markings.  Cervical spine fixation hardware.  IMPRESSION: Stable mild chronic interstitial lung disease, compatible with the history of smoking.  No acute abnormality.    Original Report Authenticated By: Darrol Angel, M.D.    Dg Lumbar Spine Complete  01/23/2012  *RADIOLOGY REPORT*  Clinical Data: Fall.  Low back pain.  Acute mental status changes.  LUMBAR SPINE - COMPLETE 4+ VIEW  Comparison: Lumbar spine x-rays 05/18/2005.  Findings: Five non-rib bearing lumbar vertebrae with anatomic alignment.  No fractures.  Moderate disc space narrowing at L3-4 and mild disc space narrowing at L1-2 and L2-3, progressive since the prior  examination.  Extensive lower thoracic spondylosis, also progressive.  No pars defects.  Diffuse facet degenerative changes. Visualized sacroiliac joints intact.  Right common iliac artery stent.  IMPRESSION: No acute osseous abnormality.  Progressive degenerative disc disease and spondylosis at L1-2, L2-3, and L3-4 since 2007 (worst at L3-4).  Diffuse facet degenerative changes.   Original Report Authenticated By: Arnell Sieving, M.D.    Dg Sacrum/coccyx  01/24/2012  IMPRESSION: No acute bony pathology.   Original Report Authenticated By: Donavan Burnet, M.D.    Dg Hip Complete Left  01/23/2012   IMPRESSION: No acute osseous abnormality.   Original Report Authenticated By: Arnell Sieving, M.D.     Scheduled Meds:    . ipratropium  0.5 mg Nebulization Q4H   And  . albuterol  2.5 mg Nebulization Q4H  . amitriptyline  25 mg Oral QHS  . amLODipine  10 mg Oral Daily  . beta carotene w/minerals  1 tablet Oral Daily  . bisacodyl  10 mg Oral Once  . carvedilol  25 mg Oral BID WC  . cloNIDine  0.2 mg Oral BID  . heparin  5,000 Units Subcutaneous Q8H  . hydrochlorothiazide  25 mg Oral Daily  . insulin aspart  0-5 Units Subcutaneous QHS  . insulin aspart  0-9 Units Subcutaneous TID WC  . insulin glargine  10 Units Subcutaneous QHS  . levofloxacin  500 mg Oral Q48H  . levothyroxine  50 mcg Oral Daily  . polyethylene glycol  68 g Oral Once  . predniSONE  40 mg Oral Q breakfast  . sodium chloride  3 mL Intravenous Q12H  . DISCONTD:  cloNIDine  0.1 mg Oral BID   Continuous Infusions:    . sodium chloride    . sodium chloride 125 mL/hr at 01/26/12 0414     Time spent: 35 minutes    Zyquan Crotty, [facility]  [facility] Pager [phone]. If 8PM-8AM, please contact night-coverage at [email or web address], password [facility] 01/26/2012, 11:57 AM  LOS: 3 days

## 2012-01-26 NOTE — ED Provider Notes (Signed)
Medical screening examination/treatment/procedure(s) were conducted as a shared visit with non-physician practitioner(s) and myself.  I personally evaluated the patient during the encounter  Patient originally put in CDU by Dr Weldon Inches, will need admission for pain and UTI.  Shelda Jakes, MD 01/26/12 [phone]

## 2012-01-26 NOTE — Clinical Social Work Placement (Addendum)
    Clinical Social Work Department CLINICAL SOCIAL WORK PLACEMENT NOTE 01/26/2012  Patient:  [name], [name]  Account Number:  [id number] Admit date:  01/23/2012  Clinical Social Worker:  Margaree Mackintosh  Date/time:  01/26/2012 03:03 PM  Clinical Social Work is seeking post-discharge placement for this patient at the following level of care:   ASSISTED LIVING/REST HOME   (*CSW will update this form in Epic as items are completed)     Patient/family provided with [facility] Department of Clinical Social Work's list of facilities offering this level of care within the geographic area requested by the patient (or if unable, by the patient's family).  01/26/2012  Patient/family informed of their freedom to choose among providers that offer the needed level of care, that participate in Medicare, Medicaid or managed care program needed by the patient, have an available bed and are willing to accept the patient.    Patient/family informed of [facility]' ownership interest in [facility], as well as of the fact that they are under no obligation to receive care at this facility.  PASARR submitted to EDS on  PASARR number received from EDS on   FL2 transmitted to all facilities in geographic area requested by pt/family on  01/26/2012 FL2 transmitted to all facilities within larger geographic area on   Patient informed that his/her managed care company has contracts with or will negotiate with  certain facilities, including the following:     Patient/family informed of bed offers received:   Patient chooses bed at [facility] Physician recommends and patient chooses bed at    Patient to be transferred to  on  01/27/12 Patient to be transferred to facility by [facility]  The following physician request were entered in Epic:   Additional Comments: Family has signed contract paperwork with [employer]

## 2012-01-27 DIAGNOSIS — D649 Anemia, unspecified: Secondary | ICD-10-CM

## 2012-01-27 DIAGNOSIS — I5189 Other ill-defined heart diseases: Secondary | ICD-10-CM

## 2012-01-27 DIAGNOSIS — I059 Rheumatic mitral valve disease, unspecified: Secondary | ICD-10-CM

## 2012-01-27 DIAGNOSIS — E119 Type 2 diabetes mellitus without complications: Secondary | ICD-10-CM

## 2012-01-27 DIAGNOSIS — I519 Heart disease, unspecified: Secondary | ICD-10-CM

## 2012-01-27 DIAGNOSIS — I2510 Atherosclerotic heart disease of native coronary artery without angina pectoris: Secondary | ICD-10-CM

## 2012-01-27 LAB — BASIC METABOLIC PANEL
BUN: 21 mg/dL (ref 6–23)
Calcium: 9.8 mg/dL (ref 8.4–10.5)
Creatinine, Ser: 1.02 mg/dL (ref 0.50–1.10)
GFR calc Af Amer: 61 mL/min — ABNORMAL LOW (ref >90)
GFR calc non Af Amer: 53 mL/min — ABNORMAL LOW (ref >90)
Glucose, Bld: 272 mg/dL — ABNORMAL HIGH (ref 70–99)

## 2012-01-27 LAB — CBC
HCT: 34.6 % — ABNORMAL LOW (ref 36.0–46.0)
Hemoglobin: 11.6 g/dL — ABNORMAL LOW (ref 12.0–15.0)
MCH: 28.9 pg (ref 26.0–34.0)
MCHC: 33.5 g/dL (ref 30.0–36.0)
MCV: 86.3 fL (ref 78.0–100.0)
RDW: 12.9 % (ref 11.5–15.5)

## 2012-01-27 MED ORDER — INSULIN ASPART 100 UNIT/ML ~~LOC~~ SOLN: 3.0000 [IU] | Freq: Three times a day (TID) | SUBCUTANEOUS | Status: DC

## 2012-01-27 MED ORDER — IPRATROPIUM-ALBUTEROL 0.5-2.5 (3) MG/3ML IN SOLN: 3.0000 mL | Freq: Four times a day (QID) | RESPIRATORY_TRACT | Status: DC | PRN

## 2012-01-27 MED ORDER — CLONIDINE HCL 0.2 MG PO TABS: 0.2000 mg | ORAL_TABLET | Freq: Two times a day (BID) | ORAL | Status: DC

## 2012-01-27 MED ORDER — INSULIN ASPART 100 UNIT/ML ~~LOC~~ SOLN: 0.0000 [IU] | Freq: Every day | SUBCUTANEOUS | Status: DC

## 2012-01-27 MED ORDER — INSULIN GLARGINE 100 UNIT/ML ~~LOC~~ SOLN: 18.0000 [IU] | Freq: Every day | SUBCUTANEOUS | Status: DC

## 2012-01-27 MED ORDER — LISINOPRIL 5 MG PO TABS
5.0000 mg | ORAL_TABLET | Freq: Every day | ORAL | Status: DC
  Administered 2012-01-27: 5 mg via ORAL
  Filled 2012-01-27: qty 1

## 2012-01-27 MED ORDER — LISINOPRIL 5 MG PO TABS: 5.0000 mg | ORAL_TABLET | Freq: Every day | ORAL | Status: DC

## 2012-01-27 MED ORDER — OXYBUTYNIN CHLORIDE 5 MG PO TABS: 5.0000 mg | ORAL_TABLET | Freq: Every day | ORAL | Status: DC | PRN

## 2012-01-27 MED ORDER — INSULIN ASPART 100 UNIT/ML ~~LOC~~ SOLN: 0.0000 [IU] | Freq: Three times a day (TID) | SUBCUTANEOUS | Status: DC

## 2012-01-27 MED ORDER — ALBUTEROL SULFATE (5 MG/ML) 0.5% IN NEBU: 2.5000 mg | INHALATION_SOLUTION | RESPIRATORY_TRACT | Status: DC | PRN

## 2012-01-27 MED ORDER — INSULIN GLARGINE 100 UNIT/ML ~~LOC~~ SOLN
10.0000 [IU] | Freq: Once | SUBCUTANEOUS | Status: AC
  Administered 2012-01-27: 10 [IU] via SUBCUTANEOUS

## 2012-01-27 MED ORDER — PREDNISONE 20 MG PO TABS: 40.0000 mg | ORAL_TABLET | Freq: Every day | ORAL | Status: AC

## 2012-01-27 MED ORDER — INSULIN GLARGINE 100 UNIT/ML ~~LOC~~ SOLN: 25.0000 [IU] | Freq: Every day | SUBCUTANEOUS | Status: DC

## 2012-01-27 MED ORDER — LEVOFLOXACIN 500 MG PO TABS: 500.0000 mg | ORAL_TABLET | ORAL | Status: AC

## 2012-01-27 MED ORDER — OCUVITE PO TABS: 1.0000 | ORAL_TABLET | Freq: Every day | ORAL | Status: DC

## 2012-01-27 NOTE — Progress Notes (Signed)
Clinical Social Worker spoke with pt's son, Jonny Ruiz.  CSW spoke with pt's son to ensure pt's family was aware that if Medicaid paperwork is not complete and approved, family will be responsible for payment until Medicaid is approved.  Once Medicaid is approved, family will receive a reimbursement from facility.  Son to speak with family.  CSW to continue to follow and assist as needed.   Angelia Mould, MSW, [place] [phone]

## 2012-01-27 NOTE — Progress Notes (Signed)
DC orders received.  Patient stable with no S/S of distress.  Medication and discharge information reviewed with patient and patient's family.  [employer] called and report given to the Lobbyist, [place], because the RN was occupied.  Patient to be DC via ambulance to [facility]. Nolon Nations

## 2012-01-27 NOTE — Discharge Summary (Addendum)
Physician Discharge Summary  [name] GNF:[id number] DOB: [birth year] DOA: 01/23/2012  PCP: Lupita Raider, MD  Admit date: 01/23/2012 Discharge date: 01/27/2012  Recommendations for Outpatient Follow-up:   1.  Discharge to SNF for continued physical therapy and strength building 2.  Follow up in 1 week for blood pressure check, respiratory exam, BMP and CBC and urinalysis with urine culture.  Discharge Diagnoses:  Principal Problem:  *Pyelonephritis Active Problems:  HYPOTHYROIDISM  DM  HYPERCHOLESTEROLEMIA  HYPERTENSION  CAD  COPD  Lumbago  Hypoxia  Elevated serum creatinine  Acute exacerbation of chronic obstructive pulmonary disease (COPD)  Diastolic dysfunction, grade II   Discharge Condition: stable, improved  Diet recommendation:  Healthy heart, 2gm sodium, carbohydrate controlled  Wt Readings from Last 3 Encounters:  01/24/12 84.006 kg (185 lb 3.2 oz)    History of present illness:   She was found to have altered mental status on arrival to ED. SHe is fully oriented now. SHe states that she thinks jherSugars went dwn this morning to 41-She was told this by her Grandson. THe low sugars have been going on for about 1-2 months-is on insulin. Sh states that she was decreased from lantus 60--->50 units. That was about 1 month ago. She states she was in [facility] when blood sugar drops ovvured.  No recent h/o chills or fever other than this morning-this was not measured. No cough or cold, no dysuria, , no n/v no CP currently  She has never had AMS before and ad a fall about 2 weeks ago when she fell of the swing. SHe didn;t tell her dr. This. SHe cannot walk much due to this. Can walk maybe about 10 steps now-had no limitation prior. Using the wheelchair (electric chair ) for a cpl of days.  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 It took 5 min to wake her up, she received IVF-she became somewhat coherent-her Blood sugar was down to 40. She has been out in [place] and her CBG have been normal and everything had been good till 01/19/2012. The CBG that am was 50-she was incoherent and babbling that day repeating herself. She was given OJ and food and was fine. She reportedly is on Lantus 50.  SHe has been eating and drinking normally-but was not eating much yesterday. Saturday she c/o she couldn't walk and she couldnt move her legs. He also shares that on 01/21/2012 patient had been put to bed because of severe pain in her lower back by the patient's daughter and daughter-in-law.  He thinks she has been stressed out due to Health care-she also has bad neurpathy aand Arthritis has worsened--[facility].  Usually is pretty coherent and normally has a good memory   Hospital Course:   Hypoglycemia:  Resolved after holding insulin for the first several days of admission.  After starting steroids for her COPD exacerbation, however, her fingersticks rose.  She should start lantus 25 units qHS tonight with 3 units aspart with meals and sliding scale insulin as needed.  Please adjust as needed  *Pyelonephritis: Cultures negative, however, patient had too numerous to count WBC and many bacteria, a significant change from her baseline UA. Repeat UA with significantly decreased WBC.   9/13 is day 4 of 7 of levaquin for pyelonephritis, last dose on 9/15.  HYPERTENSION: Patient presented with elevated blood  pressures requiring resumption of her home medications plus additional. She then became hypotensive which improved with holding her clonidine and diuretic and given IVF. Patient's blood pressure trended up on 9/11 so restarted clonidine, norvasc and HCTZ, however, now her pressures are continue to be elevated but around the 150s systolic and stable.    Acute exacerbation of COPD with  desaturation overnight on 9/11:  No evidence of pneumonia on exam. CXR: stable interstitial disease.  She was started on Prednisone 40mg  po daily x 2 weeks, first dose no 9/12 and duonebs with albuterol nebs as needed.  She should complete a 14 day taper of steroids.  She also had an elevated BNP and was given 1 dose of lasix 20mg  with good diuresis.  ECHO revealed preserved EF with grade 2 diastolic dysfunction.    Creatinine rose on 9/11, but trended back down on 9/12: May have been due to mild ATN from relative hypotension.   Active Problems:  HYPOTHYROIDISM: Stable.   DM: See hypoglycemia above.  A1c at 7.   HYPERCHOLESTEROLEMIA: Stable.   CAD: Stable.  Status post recent fall: No fractures.  Lumbago: Pain control   Procedures:  ECHO on 9/13  Consultations:  None  Antibiotics: Levaquin 9/10 >> 9/16 (but dosed every other day, so last dose is on 9/15)    Discharge Exam: Filed Vitals:   01/27/12 1635  BP: 162/51  Pulse: 68  Temp:   Resp:    Filed Vitals:   01/27/12 0945 01/27/12 1334 01/27/12 1514 01/27/12 1635  BP: 160/68 150/65 155/57 162/51  Pulse:  62 69 68  Temp:  99.1 F (37.3 C) 98.9 F (37.2 C)   TempSrc:   Oral   Resp:  18 20   Height:      Weight:      SpO2:  96% 100%    General: No acute distress  Psych: Alert and oriented x3, appropriate  HEENT: Normocephalic, atraumatic, mucous membranes moist  Cardiovascular: Regular rate and rhythm, S1-S2  Respiratory:  Clear to auscultation bilaterally Abdomen: Abdomen soft, nontender, nondistended, normal active bowel sounds  Extremities: No clubbing or cyanosis, trace pitting edema  Discharge Instructions      Discharge Orders    Future Orders Please Complete By Expires   Increase activity slowly      Call MD for:      Comments:   Call 911 if you experience chest pain with shortness of breath, slurred speech, facial droop, weakness of arm or leg, or confusion.   Call MD for:  temperature >100.4       Call MD for:  persistant nausea and vomiting      Call MD for:  severe uncontrolled pain      Call MD for:  difficulty breathing, headache or visual disturbances      Call MD for:  hives      Call MD for:  persistant dizziness or light-headedness      Call MD for:  extreme fatigue          Medication List     As of 01/27/2012  4:51 PM    STOP taking these medications         insulin detemir 100 UNIT/ML injection   Commonly known as: LEVEMIR      insulin lispro 100 UNIT/ML injection   Commonly known as: HUMALOG      TAKE these medications         albuterol (5 MG/ML) 0.5% nebulizer solution  Commonly known as: PROVENTIL   Take 0.5 mLs (2.5 mg total) by nebulization every 2 (two) hours as needed for wheezing or shortness of breath.      ALPRAZolam 0.25 MG tablet   Commonly known as: XANAX   Take 0.25 mg by mouth 2 (two) times daily as needed. For anxiety      amitriptyline 25 MG tablet   Commonly known as: ELAVIL   Take 25 mg by mouth at bedtime.      amLODipine 10 MG tablet   Commonly known as: NORVASC   Take 10 mg by mouth daily.      beta carotene w/minerals tablet   Take 1 tablet by mouth daily.      bisacodyl 5 MG EC tablet   Commonly known as: DULCOLAX   Take 5 mg by mouth daily as needed. constipation      carvedilol 25 MG tablet   Commonly known as: COREG   Take 25 mg by mouth 2 (two) times daily with a meal.      cloNIDine 0.2 MG tablet   Commonly known as: CATAPRES   Take 1 tablet (0.2 mg total) by mouth 2 (two) times daily.      hydrochlorothiazide 25 MG tablet   Commonly known as: HYDRODIURIL   Take 25 mg by mouth daily.      HYDROcodone-acetaminophen 5-500 MG per tablet   Commonly known as: VICODIN   Take 1 tablet by mouth every 6 (six) hours as needed. For pain      insulin aspart 100 UNIT/ML injection   Commonly known as: novoLOG   Inject 0-5 Units into the skin at bedtime.      insulin aspart 100 UNIT/ML injection   Commonly known  as: novoLOG   Inject 0-9 Units into the skin 3 (three) times daily with meals.      insulin aspart 100 UNIT/ML injection   Commonly known as: novoLOG   Inject 3 Units into the skin 3 (three) times daily before meals.      insulin glargine 100 UNIT/ML injection   Commonly known as: LANTUS   Inject 25 Units into the skin at bedtime.      ipratropium-albuterol 0.5-2.5 (3) MG/3ML Soln   Commonly known as: DUONEB   Take 3 mLs by nebulization every 6 (six) hours as needed.      levofloxacin 500 MG tablet   Commonly known as: LEVAQUIN   Take 1 tablet (500 mg total) by mouth every other day.      levothyroxine 50 MCG tablet   Commonly known as: SYNTHROID, LEVOTHROID   Take 50 mcg by mouth daily.      lisinopril 5 MG tablet   Commonly known as: PRINIVIL,ZESTRIL   Take 1 tablet (5 mg total) by mouth daily.      multivitamin with minerals Tabs   Take 1 tablet by mouth daily. I-CAPS      oxybutynin 5 MG tablet   Commonly known as: DITROPAN   Take 1 tablet (5 mg total) by mouth daily as needed. For incontinence      predniSONE 20 MG tablet   Commonly known as: DELTASONE   Take 2 tablets (40 mg total) by mouth daily with breakfast.      rosuvastatin 20 MG tablet   Commonly known as: CRESTOR   Take 20 mg by mouth daily.         Follow-up Information    Follow up with Lupita Raider, MD. Schedule an appointment as  soon as possible for a visit in 1 week.   Contact information:   [address] [place] [address] [phone]           The results of significant diagnostics from this hospitalization (including imaging, microbiology, ancillary and laboratory) are listed below for reference.    Significant Diagnostic Studies: Dg Chest 2 View  01/23/2012  *RADIOLOGY REPORT*  Clinical Data: Mild cough.  Smoker.  CHEST - 2 VIEW  Comparison: 06/08/2009.  Findings: Decreased inspiration.  Borderline enlarged cardiac silhouette.  Clear lungs with normal  vascularity.  Stable mildly prominent interstitial markings.  Cervical spine fixation hardware.  IMPRESSION: Stable mild chronic interstitial lung disease, compatible with the history of smoking.  No acute abnormality.   Original Report Authenticated By: Darrol Angel, M.D.    Dg Lumbar Spine Complete  01/23/2012  *RADIOLOGY REPORT*  Clinical Data: Fall.  Low back pain.  Acute mental status changes.  LUMBAR SPINE - COMPLETE 4+ VIEW  Comparison: Lumbar spine x-rays 05/18/2005.  Findings: Five non-rib bearing lumbar vertebrae with anatomic alignment.  No fractures.  Moderate disc space narrowing at L3-4 and mild disc space narrowing at L1-2 and L2-3, progressive since the prior examination.  Extensive lower thoracic spondylosis, also progressive.  No pars defects.  Diffuse facet degenerative changes. Visualized sacroiliac joints intact.  Right common iliac artery stent.  IMPRESSION: No acute osseous abnormality.  Progressive degenerative disc disease and spondylosis at L1-2, L2-3, and L3-4 since 2007 (worst at L3-4).  Diffuse facet degenerative changes.   Original Report Authenticated By: Arnell Sieving, M.D.    Dg Sacrum/coccyx  01/24/2012  *RADIOLOGY REPORT*  Clinical Data: Low back pain  SACRUM AND COCCYX - 2+ VIEW  Comparison: 05/18/2005  Findings: No acute fracture and no dislocation.  Mild degenerative change of the hip joints.  Mild osteopenia.  IMPRESSION: No acute bony pathology.   Original Report Authenticated By: Donavan Burnet, M.D.    Dg Hip Complete Left  01/23/2012  *RADIOLOGY REPORT*  Clinical Data: Fall.  Left hip pain.  Acute mental status changes.  LEFT HIP - COMPLETE 2+ VIEW  Comparison: AP pelvis 05/18/2005.  Findings: No evidence of acute fracture or dislocation.  Joint space well preserved for age.  Bone mineral density well preserved.  Included AP pelvis shows an old healed fracture the right inferior pubic ramus.  No acute fractures elsewhere.  Contralateral right hip joint intact.   Sacroiliac joints and symphysis pubis intact.  IMPRESSION: No acute osseous abnormality.   Original Report Authenticated By: Arnell Sieving, M.D.     Microbiology: Recent Results (from the past 240 hour(s))  URINE CULTURE     Status: Normal   Collection Time   01/23/12 12:35 PM      Component Value Range Status Comment   Specimen Description URINE, CATHETERIZED   Final    Special Requests ADDED 01/23/12 1825   Final    Culture  Setup Time 01/24/2012 01:38   Final    Colony Count NO GROWTH   Final    Culture NO GROWTH   Final    Report Status 01/24/2012 FINAL   Final   URINE CULTURE     Status: Normal   Collection Time   01/24/12  1:39 AM      Component Value Range Status Comment   Specimen Description URINE, CATHETERIZED   Final    Special Requests NONE   Final    Culture  Setup Time  01/24/2012 08:00   Final    Colony Count NO GROWTH   Final    Culture NO GROWTH   Final    Report Status 01/25/2012 FINAL   Final      Labs: Basic Metabolic Panel:  Lab 01/27/12 1610 01/26/12 0500 01/24/12 0426 01/23/12 1221  NA 141 140 141 140  K 4.7 4.9 4.2 4.5  CL 109 110 109 106  CO2 19 17* 20 20  GLUCOSE 272* 144* 94 115*  BUN 21 21 22  27*  CREATININE 1.02 1.13* 1.31* 1.17*  CALCIUM 9.8 9.3 9.1 9.9  MG -- -- -- --  PHOS -- -- -- --   Liver Function Tests:  Lab 01/24/12 0426  AST 13  ALT 9  ALKPHOS 60  BILITOT 0.1*  PROT 6.1  ALBUMIN 2.7*   No results found for this basename: LIPASE:5,AMYLASE:5 in the last 168 hours No results found for this basename: AMMONIA:5 in the last 168 hours CBC:  Lab 01/27/12 0555 01/26/12 0500 01/24/12 0426 01/23/12 1221  WBC 7.2 9.8 12.0* 13.2*  NEUTROABS -- -- -- 10.2*  HGB 11.6* 11.1* 10.9* 13.3  HCT 34.6* 33.9* 33.4* 41.0  MCV 86.3 87.8 89.5 89.7  PLT 227 211 224 247   Cardiac Enzymes: No results found for this basename: CKTOTAL:5,CKMB:5,CKMBINDEX:5,TROPONINI:5 in the last 168 hours BNP: BNP (last 3 results)  Basename 01/26/12 0500    PROBNP 6266.0*   CBG:  Lab 01/27/12 1118 01/27/12 0734 01/26/12 2137 01/26/12 1703 01/26/12 1157  GLUCAP 250* 262* 293* 226* 237*    Time coordinating discharge: 45 minutes  Signed:  Connor Foxworthy  [facility] 01/27/2012, 4:51 PM

## 2012-01-27 NOTE — Progress Notes (Signed)
  Echocardiogram 2D Echocardiogram has been performed.  [name] 01/27/2012, 10:38 AM

## 2012-01-27 NOTE — Progress Notes (Signed)
Physical Therapy Treatment Patient Details Name: [name] MRN: [id number] DOB: November 24, 1934 Today's Date: 01/27/2012 Time: 8657-8469 PT Time Calculation (min): 24 min  PT Assessment / Plan / Recommendation Comments on Treatment Session  pt rpesents with Pyelonephritis and Htn.  pt very flat affect today and seems quite depressed.  Lengthy discussion with pt to see what was bothering her.  pt does not want to go to [facility]F.  When asked why is she going there then, pt states "It's my daughter's doing."  When asked what pt wants she states she would rather go to [facility] where she had been before, because she liked it there, but states her family didn't like it.  Expressed that D/C planning is ultimately pt's descision, but pt states she's worried her daughter would be mad at her for "going above her".  pt would be better served at a SNF level of care and pt stating she wants [facility].  RN made aware.  SW/CM made aware.      Follow Up Recommendations  Skilled nursing facility    Barriers to Discharge        Equipment Recommendations  None recommended by PT    Recommendations for Other Services OT consult  Frequency Min 3X/week   Plan Discharge plan remains appropriate;Frequency remains appropriate    Precautions / Restrictions Precautions Precautions: Fall Restrictions Weight Bearing Restrictions: No   Pertinent Vitals/Pain Denies pain.      Mobility  Bed Mobility Bed Mobility: Supine to Sit;Sitting - Scoot to Edge of Bed;Sit to Supine Supine to Sit: 4: Min assist Sitting - Scoot to Edge of Bed: 3: Mod assist Sit to Supine: 4: Min assist Details for Bed Mobility Assistance: demos good use of rail.   Transfers Transfers: Sit to Stand;Stand to Sit Sit to Stand: 4: Min assist;With upper extremity assist;From bed Stand to Sit: 4: Min assist;With upper extremity assist;To bed Details for Transfer Assistance: cues for getting closer to bed, using UEs to  control descent Ambulation/Gait Ambulation/Gait Assistance: 4: Min guard Ambulation Distance (Feet): 20 Feet Assistive device: Rolling walker Ambulation/Gait Assistance Details: cues for positioning in RW, upright posture.  pt moves slowly and Initially dragging L foot, then able to clear floor after a few steps.   Gait Pattern: Step-through pattern;Decreased stride length;Trunk flexed Stairs: No Wheelchair Mobility Wheelchair Mobility: No    Exercises     PT Diagnosis:    PT Problem List:   PT Treatment Interventions:     PT Goals Acute Rehab PT Goals Time For Goal Achievement: 02/09/12 PT Goal: Supine/Side to Sit - Progress: Progressing toward goal PT Goal: Sit to Supine/Side - Progress: Progressing toward goal PT Goal: Sit to Stand - Progress: Progressing toward goal PT Goal: Stand to Sit - Progress: Progressing toward goal PT Goal: Ambulate - Progress: Progressing toward goal  Visit Information  Last PT Received On: 01/27/12 Assistance Needed: +1    Subjective Data  Subjective: I'd rather go to [facility].  I liked it there.     Cognition  Overall Cognitive Status: Appears within functional limits for tasks assessed/performed Arousal/Alertness: Awake/alert Orientation Level: Oriented X4 / Intact Behavior During Session: Flat affect Cognition - Other Comments: pt seems very down.  Difficult to get pt to smile.      Balance  Balance Balance Assessed: No  End of Session PT - End of Session Equipment Utilized During Treatment: Gait belt Activity Tolerance: Patient limited by fatigue Patient left: in bed;with call  bell/phone within reach Nurse Communication: Mobility status (pt's D/C wishes.  )   GP     [name], [name] [phone] 01/27/2012, 2:29 PM

## 2012-01-27 NOTE — Progress Notes (Signed)
Clinical Social Worker spoke with pt's son, Jonny Ruiz, and relayed that per MD pt will be ready today.  Son asked about "keeping mom in until Monday" as they are coordinating with [facility].  CSW explained that if pt and family choose to have pt remain in hospital after medically cleared, Medicare may not reimburse for hospital stay.  CSW inquired about moving forward with a SNF ,  Son stated understanding and declined SNF option.  CSW received phone call from Son's dtr-Donna.  Lupita Leash again requested same information regarding have pt remain in hospital after being medically cleared.  CSW reviewed information with [facility] stated understanding and also declined SNF option.   CSW to continue to follow and assist as needed.   Angelia Mould, MSW, [place] [phone]

## 2012-01-27 NOTE — Progress Notes (Signed)
Clinical Social Worker received notification that pt is alert and oriented and is requesting to dc to [facility].  CSW spoke with pt who did request to dc to [facility] stating, "They work you hard there, in a good way"  Pt shared that she is ready to begin rehab.  CSW spoke with SNF who stated they are able to accept pt today.  CSW spoke with pt's son and reviewed information, son stated understanding.  CSW to continue to follow and assist as needed.   Angelia Mould, MSW, [place] [phone]

## 2012-01-27 NOTE — Progress Notes (Signed)
Inpatient Diabetes Program Recommendations  AACE/ADA: New Consensus Statement on Inpatient Glycemic Control  Target Ranges:  Prepandial:   less than 140 mg/dL      Peak postprandial:   less than 180 mg/dL (1-2 hours)      Critically ill patients:  140 - 180 mg/dL  Pager:  [phone] Hours:  8 am-10pm   Reason for Visit: Elevated lab glucose: 272 mg/dL  Inpatient Diabetes Program Recommendations Insulin - Basal: Patient will most likely need more basal insulin:  Patient takes ~ 110 units Levemir daily   Alfredia Client PhD, RN, BC-ADM Diabetes Coordinator  Office:  [phone] Team Pager:  [phone]

## 2012-01-30 LAB — QUANTIFERON TB GOLD ASSAY (BLOOD): Interferon Gamma Release Assay: NEGATIVE

## 2012-01-30 NOTE — Progress Notes (Signed)
Clinical Social Worker received request to meet with pt and family.  CSW introduced self, explained role, and provided support.  Dtr-Donna-expressed worry with pt dc'ing to SNF.  CSW reviewed PT recommendations for SNF.  Lupita Leash inquired if "People up here (hospital) were telling momma to go to a SNF".  CSW relayed that medical staff believe pt to be alert and oriented and therefore able to make her own decisions.  CSW requested dtr to step out of room to allow CSW to meet with pt one on one; Dtr obliged.  CSW inquired about pt's wishes-pt stated "I just want to go where I"m going to get therapy".  CSW inquired about which dc disposition she would like; pt endorsed [facility].  CSW asked if pt felt threatened, unsafe, or in danger; pt denied all.  CSW encouraged pt to speak with ALF Social Worker or medical staff if she has any quesitons or concerns once dc'd from hospital-pt stated understanding.   CSW updated ALF and SNF-[facility] with regards to dc disposition.  CSW provided all requested information to ALF. CSW coordinated transport with ALF and RN.  CSW to sign off at time of dc from hospital.   Angelia Mould, MSW, [place] [phone]

## 2012-06-25 ENCOUNTER — Other Ambulatory Visit: Payer: Self-pay | Admitting: Otolaryngology

## 2012-10-31 ENCOUNTER — Emergency Department (HOSPITAL_COMMUNITY): Payer: Medicare Other

## 2012-10-31 ENCOUNTER — Encounter (HOSPITAL_COMMUNITY): Payer: Self-pay | Admitting: Emergency Medicine

## 2012-10-31 ENCOUNTER — Inpatient Hospital Stay (HOSPITAL_COMMUNITY)
Admission: EM | Admit: 2012-10-31 | Discharge: 2012-11-02 | DRG: 684 | Disposition: A | Discharge status: Home Health | Payer: Medicare Other | Attending: Internal Medicine | Admitting: Internal Medicine

## 2012-10-31 DIAGNOSIS — I129 Hypertensive chronic kidney disease with stage 1 through stage 4 chronic kidney disease, or unspecified chronic kidney disease: Secondary | ICD-10-CM | POA: Diagnosis present

## 2012-10-31 DIAGNOSIS — S0120XA Unspecified open wound of nose, initial encounter: Secondary | ICD-10-CM | POA: Diagnosis present

## 2012-10-31 DIAGNOSIS — E119 Type 2 diabetes mellitus without complications: Secondary | ICD-10-CM

## 2012-10-31 DIAGNOSIS — Z7982 Long term (current) use of aspirin: Secondary | ICD-10-CM

## 2012-10-31 DIAGNOSIS — E860 Dehydration: Secondary | ICD-10-CM | POA: Diagnosis present

## 2012-10-31 DIAGNOSIS — W050XXA Fall from non-moving wheelchair, initial encounter: Secondary | ICD-10-CM | POA: Diagnosis present

## 2012-10-31 DIAGNOSIS — Z794 Long term (current) use of insulin: Secondary | ICD-10-CM

## 2012-10-31 DIAGNOSIS — R7989 Other specified abnormal findings of blood chemistry: Secondary | ICD-10-CM

## 2012-10-31 DIAGNOSIS — E875 Hyperkalemia: Secondary | ICD-10-CM | POA: Diagnosis present

## 2012-10-31 DIAGNOSIS — R5381 Other malaise: Secondary | ICD-10-CM

## 2012-10-31 DIAGNOSIS — I2510 Atherosclerotic heart disease of native coronary artery without angina pectoris: Secondary | ICD-10-CM

## 2012-10-31 DIAGNOSIS — S0003XA Contusion of scalp, initial encounter: Secondary | ICD-10-CM | POA: Diagnosis present

## 2012-10-31 DIAGNOSIS — E039 Hypothyroidism, unspecified: Secondary | ICD-10-CM | POA: Diagnosis present

## 2012-10-31 DIAGNOSIS — E1149 Type 2 diabetes mellitus with other diabetic neurological complication: Secondary | ICD-10-CM | POA: Diagnosis present

## 2012-10-31 DIAGNOSIS — S0033XA Contusion of nose, initial encounter: Secondary | ICD-10-CM

## 2012-10-31 DIAGNOSIS — N179 Acute kidney failure, unspecified: Principal | ICD-10-CM | POA: Diagnosis present

## 2012-10-31 DIAGNOSIS — R609 Edema, unspecified: Secondary | ICD-10-CM

## 2012-10-31 DIAGNOSIS — E1142 Type 2 diabetes mellitus with diabetic polyneuropathy: Secondary | ICD-10-CM | POA: Diagnosis present

## 2012-10-31 DIAGNOSIS — Z981 Arthrodesis status: Secondary | ICD-10-CM

## 2012-10-31 DIAGNOSIS — I5189 Other ill-defined heart diseases: Secondary | ICD-10-CM

## 2012-10-31 DIAGNOSIS — N289 Disorder of kidney and ureter, unspecified: Secondary | ICD-10-CM

## 2012-10-31 DIAGNOSIS — S0121XA Laceration without foreign body of nose, initial encounter: Secondary | ICD-10-CM

## 2012-10-31 DIAGNOSIS — R0602 Shortness of breath: Secondary | ICD-10-CM

## 2012-10-31 DIAGNOSIS — I739 Peripheral vascular disease, unspecified: Secondary | ICD-10-CM | POA: Diagnosis present

## 2012-10-31 DIAGNOSIS — E7800 Pure hypercholesterolemia, unspecified: Secondary | ICD-10-CM | POA: Diagnosis present

## 2012-10-31 DIAGNOSIS — N183 Chronic kidney disease, stage 3 unspecified: Secondary | ICD-10-CM | POA: Diagnosis present

## 2012-10-31 DIAGNOSIS — I10 Essential (primary) hypertension: Secondary | ICD-10-CM | POA: Diagnosis present

## 2012-10-31 DIAGNOSIS — Z993 Dependence on wheelchair: Secondary | ICD-10-CM

## 2012-10-31 DIAGNOSIS — M545 Low back pain: Secondary | ICD-10-CM

## 2012-10-31 DIAGNOSIS — N12 Tubulo-interstitial nephritis, not specified as acute or chronic: Secondary | ICD-10-CM

## 2012-10-31 DIAGNOSIS — Y9210 Unspecified residential institution as the place of occurrence of the external cause: Secondary | ICD-10-CM | POA: Diagnosis present

## 2012-10-31 DIAGNOSIS — R0902 Hypoxemia: Secondary | ICD-10-CM

## 2012-10-31 DIAGNOSIS — W07XXXA Fall from chair, initial encounter: Secondary | ICD-10-CM

## 2012-10-31 DIAGNOSIS — Z79899 Other long term (current) drug therapy: Secondary | ICD-10-CM

## 2012-10-31 DIAGNOSIS — J441 Chronic obstructive pulmonary disease with (acute) exacerbation: Secondary | ICD-10-CM

## 2012-10-31 DIAGNOSIS — W19XXXA Unspecified fall, initial encounter: Secondary | ICD-10-CM

## 2012-10-31 DIAGNOSIS — R531 Weakness: Secondary | ICD-10-CM

## 2012-10-31 DIAGNOSIS — D649 Anemia, unspecified: Secondary | ICD-10-CM

## 2012-10-31 DIAGNOSIS — J449 Chronic obstructive pulmonary disease, unspecified: Secondary | ICD-10-CM

## 2012-10-31 DIAGNOSIS — R1310 Dysphagia, unspecified: Secondary | ICD-10-CM

## 2012-10-31 HISTORY — DX: Peripheral vascular disease, unspecified: I73.9

## 2012-10-31 HISTORY — DX: Type 2 diabetes mellitus without complications: E11.9

## 2012-10-31 HISTORY — DX: Unspecified macular degeneration: H35.30

## 2012-10-31 HISTORY — DX: Chronic obstructive pulmonary disease, unspecified: J44.9

## 2012-10-31 HISTORY — DX: Type 2 diabetes mellitus with diabetic polyneuropathy: E11.42

## 2012-10-31 HISTORY — DX: Pure hypercholesterolemia, unspecified: E78.00

## 2012-10-31 LAB — COMPREHENSIVE METABOLIC PANEL
AST: 15 U/L (ref 0–37)
Albumin: 3.2 g/dL — ABNORMAL LOW (ref 3.5–5.2)
Alkaline Phosphatase: 56 U/L (ref 39–117)
Chloride: 108 mEq/L (ref 96–112)
Potassium: 5.9 mEq/L — ABNORMAL HIGH (ref 3.5–5.1)
Total Bilirubin: 0.1 mg/dL — ABNORMAL LOW (ref 0.3–1.2)
Total Protein: 6.5 g/dL (ref 6.0–8.3)

## 2012-10-31 LAB — URINALYSIS, ROUTINE W REFLEX MICROSCOPIC
Glucose, UA: 100 mg/dL — AB
Ketones, ur: NEGATIVE mg/dL
Protein, ur: 100 mg/dL — AB
pH: 5 (ref 5.0–8.0)

## 2012-10-31 LAB — CBC WITH DIFFERENTIAL/PLATELET
Basophils Absolute: 0 10*3/uL (ref 0.0–0.1)
Basophils Relative: 0 % (ref 0–1)
MCHC: 32.6 g/dL (ref 30.0–36.0)
Neutro Abs: 6.7 10*3/uL (ref 1.7–7.7)
Neutrophils Relative %: 62 % (ref 43–77)
Platelets: 177 10*3/uL (ref 150–400)
RDW: 13.7 % (ref 11.5–15.5)

## 2012-10-31 LAB — CBC
HCT: 33.9 % — ABNORMAL LOW (ref 36.0–46.0)
MCH: 28.3 pg (ref 26.0–34.0)
MCHC: 32.2 g/dL (ref 30.0–36.0)
MCV: 88.1 fL (ref 78.0–100.0)
RDW: 13.8 % (ref 11.5–15.5)

## 2012-10-31 LAB — TROPONIN I: Troponin I: <0.3 ng/mL (ref <0.30)

## 2012-10-31 LAB — URINE MICROSCOPIC-ADD ON

## 2012-10-31 MED ORDER — INSULIN ASPART 100 UNIT/ML ~~LOC~~ SOLN
4.0000 [IU] | Freq: Three times a day (TID) | SUBCUTANEOUS | Status: DC
  Administered 2012-11-01 – 2012-11-02 (×4): 4 [IU] via SUBCUTANEOUS

## 2012-10-31 MED ORDER — LEVOTHYROXINE SODIUM 50 MCG PO TABS
50.0000 ug | ORAL_TABLET | Freq: Every day | ORAL | Status: DC
  Administered 2012-11-01 – 2012-11-02 (×2): 50 ug via ORAL
  Filled 2012-10-31 (×3): qty 1

## 2012-10-31 MED ORDER — ASPIRIN 81 MG PO CHEW
81.0000 mg | CHEWABLE_TABLET | Freq: Every day | ORAL | Status: DC
  Administered 2012-11-01 – 2012-11-02 (×2): 81 mg via ORAL
  Filled 2012-10-31 (×2): qty 1

## 2012-10-31 MED ORDER — ACETAMINOPHEN 325 MG PO TABS: 650.0000 mg | ORAL_TABLET | Freq: Four times a day (QID) | ORAL | Status: DC | PRN

## 2012-10-31 MED ORDER — CARVEDILOL 25 MG PO TABS
25.0000 mg | ORAL_TABLET | Freq: Two times a day (BID) | ORAL | Status: DC
  Administered 2012-11-01 – 2012-11-02 (×3): 25 mg via ORAL
  Filled 2012-10-31 (×5): qty 1

## 2012-10-31 MED ORDER — HEALTHY EYES PO TABS: 1.0000 | ORAL_TABLET | Freq: Every day | ORAL | Status: DC

## 2012-10-31 MED ORDER — FUROSEMIDE 20 MG PO TABS
20.0000 mg | ORAL_TABLET | Freq: Every day | ORAL | Status: DC
  Administered 2012-11-01 – 2012-11-02 (×2): 20 mg via ORAL
  Filled 2012-10-31 (×2): qty 1

## 2012-10-31 MED ORDER — GABAPENTIN 400 MG PO CAPS
400.0000 mg | ORAL_CAPSULE | Freq: Every day | ORAL | Status: DC
  Administered 2012-10-31 – 2012-11-01 (×2): 400 mg via ORAL
  Filled 2012-10-31 (×3): qty 1

## 2012-10-31 MED ORDER — MOMETASONE FURO-FORMOTEROL FUM 100-5 MCG/ACT IN AERO
2.0000 | INHALATION_SPRAY | Freq: Two times a day (BID) | RESPIRATORY_TRACT | Status: DC
  Administered 2012-10-31 – 2012-11-02 (×4): 2 via RESPIRATORY_TRACT
  Filled 2012-10-31: qty 8.8

## 2012-10-31 MED ORDER — GABAPENTIN 100 MG PO CAPS
100.0000 mg | ORAL_CAPSULE | Freq: Two times a day (BID) | ORAL | Status: DC
  Filled 2012-10-31: qty 1

## 2012-10-31 MED ORDER — SENNOSIDES-DOCUSATE SODIUM 8.6-50 MG PO TABS
1.0000 | ORAL_TABLET | Freq: Every evening | ORAL | Status: DC | PRN
  Filled 2012-10-31: qty 1

## 2012-10-31 MED ORDER — ATORVASTATIN CALCIUM 10 MG PO TABS
10.0000 mg | ORAL_TABLET | Freq: Every day | ORAL | Status: DC
  Administered 2012-11-01: 10 mg via ORAL
  Filled 2012-10-31 (×2): qty 1

## 2012-10-31 MED ORDER — ALUM & MAG HYDROXIDE-SIMETH 200-200-20 MG/5ML PO SUSP: 30.0000 mL | Freq: Four times a day (QID) | ORAL | Status: DC | PRN

## 2012-10-31 MED ORDER — SODIUM CHLORIDE 0.9 % IV SOLN
INTRAVENOUS | Status: AC
  Administered 2012-10-31: 20:00:00 via INTRAVENOUS

## 2012-10-31 MED ORDER — AMITRIPTYLINE HCL 25 MG PO TABS
25.0000 mg | ORAL_TABLET | Freq: Every day | ORAL | Status: DC
  Administered 2012-10-31 – 2012-11-01 (×2): 25 mg via ORAL
  Filled 2012-10-31 (×3): qty 1

## 2012-10-31 MED ORDER — CLONIDINE HCL 0.2 MG PO TABS
0.2000 mg | ORAL_TABLET | Freq: Two times a day (BID) | ORAL | Status: DC
  Administered 2012-10-31 – 2012-11-01 (×2): 0.2 mg via ORAL
  Filled 2012-10-31 (×3): qty 1

## 2012-10-31 MED ORDER — ONDANSETRON HCL 4 MG/2ML IJ SOLN: 4.0000 mg | Freq: Four times a day (QID) | INTRAMUSCULAR | Status: DC | PRN

## 2012-10-31 MED ORDER — SODIUM CHLORIDE 0.9 % IJ SOLN
3.0000 mL | Freq: Two times a day (BID) | INTRAMUSCULAR | Status: DC
  Administered 2012-10-31 – 2012-11-02 (×4): 3 mL via INTRAVENOUS

## 2012-10-31 MED ORDER — GABAPENTIN 300 MG PO CAPS
300.0000 mg | ORAL_CAPSULE | Freq: Every day | ORAL | Status: DC
  Filled 2012-10-31: qty 1

## 2012-10-31 MED ORDER — INSULIN GLARGINE 100 UNIT/ML ~~LOC~~ SOLN
49.0000 [IU] | Freq: Every day | SUBCUTANEOUS | Status: DC
  Administered 2012-10-31 – 2012-11-01 (×2): 49 [IU] via SUBCUTANEOUS
  Filled 2012-10-31 (×3): qty 0.49

## 2012-10-31 MED ORDER — AMLODIPINE BESYLATE 10 MG PO TABS
10.0000 mg | ORAL_TABLET | Freq: Every day | ORAL | Status: DC
  Administered 2012-11-01 – 2012-11-02 (×2): 10 mg via ORAL
  Filled 2012-10-31 (×2): qty 1

## 2012-10-31 MED ORDER — GABAPENTIN 100 MG PO CAPS
100.0000 mg | ORAL_CAPSULE | Freq: Every morning | ORAL | Status: DC
  Administered 2012-11-01 – 2012-11-02 (×2): 100 mg via ORAL
  Filled 2012-10-31 (×2): qty 1

## 2012-10-31 MED ORDER — ENOXAPARIN SODIUM 30 MG/0.3ML ~~LOC~~ SOLN
30.0000 mg | SUBCUTANEOUS | Status: DC
  Administered 2012-10-31: 30 mg via SUBCUTANEOUS
  Filled 2012-10-31 (×2): qty 0.3

## 2012-10-31 MED ORDER — INSULIN ASPART 100 UNIT/ML ~~LOC~~ SOLN
0.0000 [IU] | Freq: Three times a day (TID) | SUBCUTANEOUS | Status: DC
  Administered 2012-11-01: 3 [IU] via SUBCUTANEOUS
  Administered 2012-11-02: 5 [IU] via SUBCUTANEOUS

## 2012-10-31 MED ORDER — ACETAMINOPHEN 650 MG RE SUPP: 650.0000 mg | Freq: Four times a day (QID) | RECTAL | Status: DC | PRN

## 2012-10-31 MED ORDER — OXYCODONE HCL 5 MG PO TABS: 5.0000 mg | ORAL_TABLET | ORAL | Status: DC | PRN

## 2012-10-31 MED ORDER — SODIUM CHLORIDE 0.9 % IV SOLN
INTRAVENOUS | Status: DC
  Administered 2012-11-01: 05:00:00 via INTRAVENOUS

## 2012-10-31 MED ORDER — ONDANSETRON HCL 4 MG PO TABS: 4.0000 mg | ORAL_TABLET | Freq: Four times a day (QID) | ORAL | Status: DC | PRN

## 2012-10-31 MED ORDER — PROSIGHT PO TABS
1.0000 | ORAL_TABLET | Freq: Every day | ORAL | Status: DC
  Administered 2012-11-01 – 2012-11-02 (×2): 1 via ORAL
  Filled 2012-10-31 (×2): qty 1

## 2012-10-31 MED ORDER — SODIUM POLYSTYRENE SULFONATE 15 GM/60ML PO SUSP
30.0000 g | Freq: Once | ORAL | Status: AC
  Administered 2012-10-31: 30 g via ORAL
  Filled 2012-10-31: qty 120

## 2012-10-31 NOTE — ED Notes (Addendum)
Pt from [facility] c/o fall out of wheelchair. Laceration to nose, pt ambulatory on scene per ems. CBG 152, pt alert and oriented. States she has been weak since Monday.

## 2012-10-31 NOTE — ED Notes (Signed)
Did in and out cath on patient yellow urine in return 

## 2012-10-31 NOTE — ED Notes (Signed)
Attempted report 

## 2012-10-31 NOTE — Progress Notes (Signed)
[phone] telephone report received from [facility], ER r [phone] To rm [phone] from ER via  stret cher .[id number] able to stand with assist and weight to bedside weighing scale

## 2012-10-31 NOTE — ED Provider Notes (Signed)
History     CSN: [id number]  Arrival date & time 10/31/12  1116   First MD Initiated Contact with Patient 10/31/12 1119      Chief Complaint  Patient presents with  . Fall    (Consider location/radiation/quality/duration/timing/severity/associated sxs/prior treatment) Patient is a 78 y.o. female presenting with fall. The history is provided by the patient.  Fall  She fell out of a wheelchair and suffered a laceration to her nose that she says was from her eyeglasses. She denies loss of consciousness. She denies other injury and denies other pain. She rates pain at 5/10. Of note, she has felt weaker than normal for the last 2 days. She denies fever cough or dysuria. There's been no change in the chronic swelling of her legs.  Past Medical History  Diagnosis Date  . Diabetes mellitus   . Hypertension   . Neuropathy     History reviewed. No pertinent past surgical history.  History reviewed. No pertinent family history.  History  Substance Use Topics  . Smoking status: Never Smoker   . Smokeless tobacco: Not on file  . Alcohol Use: Not on file    OB History   Grav Para Term Preterm Abortions TAB SAB Ect Mult Living                  Review of Systems  All other systems reviewed and are negative.    Allergies  Penicillins and Sulfur  Home Medications   Current Outpatient Rx  Name  Route  Sig  Dispense  Refill  . albuterol (PROVENTIL) (5 MG/ML) 0.5% nebulizer solution   Nebulization   Take 0.5 mLs (2.5 mg total) by nebulization every 2 (two) hours as needed for wheezing or shortness of breath.   20 mL   0   . ALPRAZolam (XANAX) 0.25 MG tablet   Oral   Take 0.25 mg by mouth 2 (two) times daily as needed. For anxiety         . amitriptyline (ELAVIL) 25 MG tablet   Oral   Take 25 mg by mouth at bedtime.         Marland Kitchen amLODipine (NORVASC) 10 MG tablet   Oral   Take 10 mg by mouth daily.         . beta carotene w/minerals (OCUVITE) tablet   Oral    Take 1 tablet by mouth daily.   90 tablet   3   . bisacodyl (DULCOLAX) 5 MG EC tablet   Oral   Take 5 mg by mouth daily as needed. constipation         . carvedilol (COREG) 25 MG tablet   Oral   Take 25 mg by mouth 2 (two) times daily with a meal.         . cloNIDine (CATAPRES) 0.2 MG tablet   Oral   Take 1 tablet (0.2 mg total) by mouth 2 (two) times daily.   60 tablet   3   . hydrochlorothiazide (HYDRODIURIL) 25 MG tablet   Oral   Take 25 mg by mouth daily.         Marland Kitchen HYDROcodone-acetaminophen (VICODIN) 5-500 MG per tablet   Oral   Take 1 tablet by mouth every 6 (six) hours as needed. For pain         . insulin aspart (NOVOLOG) 100 UNIT/ML injection   Subcutaneous   Inject 0-5 Units into the skin at bedtime.   1 vial   0   .  insulin aspart (NOVOLOG) 100 UNIT/ML injection   Subcutaneous   Inject 0-9 Units into the skin 3 (three) times daily with meals.   1 vial   0   . insulin aspart (NOVOLOG) 100 UNIT/ML injection   Subcutaneous   Inject 3 Units into the skin 3 (three) times daily before meals.   1 vial   12   . insulin glargine (LANTUS) 100 UNIT/ML injection   Subcutaneous   Inject 25 Units into the skin at bedtime.   10 mL   0   . ipratropium-albuterol (DUONEB) 0.5-2.5 (3) MG/3ML SOLN   Nebulization   Take 3 mLs by nebulization every 6 (six) hours as needed.   360 mL   0   . levothyroxine (SYNTHROID, LEVOTHROID) 50 MCG tablet   Oral   Take 50 mcg by mouth daily.         Marland Kitchen lisinopril (PRINIVIL,ZESTRIL) 5 MG tablet   Oral   Take 1 tablet (5 mg total) by mouth daily.   90 tablet   3   . Multiple Vitamin (MULTIVITAMIN WITH MINERALS) TABS   Oral   Take 1 tablet by mouth daily. I-CAPS         . oxybutynin (DITROPAN) 5 MG tablet   Oral   Take 1 tablet (5 mg total) by mouth daily as needed. For incontinence   90 tablet   3   . rosuvastatin (CRESTOR) 20 MG tablet   Oral   Take 20 mg by mouth daily.           BP 113/92  Pulse  56  Temp(Src) 97.8 F (36.6 C) (Oral)  Resp 16  SpO2 100%  Physical Exam  Nursing note and vitals reviewed.  78 year old female, resting comfortably and in no acute distress. Vital signs are significant for bradycardia with heart rate of 56. Oxygen saturation is 100%, which is normal. Head is normocephalic. There is swelling of the nose with tenderness. There is also a laceration on the bridge of the nose which does not have any active bleeding. No other facial injury is seen. PERRLA, EOMI. Oropharynx is clear. Neck is nontender without adenopathy or JVD. Back is nontender and there is no CVA tenderness. Lungs are clear without rales, wheezes, or rhonchi. Chest is nontender. Heart has regular rate and rhythm without murmur. Abdomen is soft, flat, nontender without masses or hepatosplenomegaly and peristalsis is normoactive. Extremities have 2+ edema, moderate venous stasis changes are present, full range of motion is present. Skin is warm and dry without rash. Neurologic: Mental status is normal, cranial nerves are intact, there are no motor or sensory deficits.  ED Course  Procedures (including critical care time)  Results for orders placed during the hospital encounter of 10/31/12  CBC WITH DIFFERENTIAL      Result Value Range   WBC 10.8 (*) 4.0 - 10.5 K/uL   RBC 3.83 (*) 3.87 - 5.11 MIL/uL   Hemoglobin 11.1 (*) 12.0 - 15.0 g/dL   HCT 16.1 (*) 09.6 - 04.5 %   MCV 88.8  78.0 - 100.0 fL   MCH 29.0  26.0 - 34.0 pg   MCHC 32.6  30.0 - 36.0 g/dL   RDW 40.9  81.1 - 91.4 %   Platelets 177  150 - 400 K/uL   Neutrophils Relative % 62  43 - 77 %   Neutro Abs 6.7  1.7 - 7.7 K/uL   Lymphocytes Relative 28  12 - 46 %   Lymphs  Abs 3.0  0.7 - 4.0 K/uL   Monocytes Relative 8  3 - 12 %   Monocytes Absolute 0.9  0.1 - 1.0 K/uL   Eosinophils Relative 3  0 - 5 %   Eosinophils Absolute 0.3  0.0 - 0.7 K/uL   Basophils Relative 0  0 - 1 %   Basophils Absolute 0.0  0.0 - 0.1 K/uL   COMPREHENSIVE METABOLIC PANEL      Result Value Range   Sodium 138  135 - 145 mEq/L   Potassium 5.9 (*) 3.5 - 5.1 mEq/L   Chloride 108  96 - 112 mEq/L   CO2 20  19 - 32 mEq/L   Glucose, Bld 161 (*) 70 - 99 mg/dL   BUN 46 (*) 6 - 23 mg/dL   Creatinine, Ser 7.82 (*) 0.50 - 1.10 mg/dL   Calcium 9.6  8.4 - 95.6 mg/dL   Total Protein 6.5  6.0 - 8.3 g/dL   Albumin 3.2 (*) 3.5 - 5.2 g/dL   AST 15  0 - 37 U/L   ALT 12  0 - 35 U/L   Alkaline Phosphatase 56  39 - 117 U/L   Total Bilirubin 0.1 (*) 0.3 - 1.2 mg/dL   GFR calc non Af Amer 30 (*) >90 mL/min   GFR calc Af Amer 34 (*) >90 mL/min  URINALYSIS, ROUTINE W REFLEX MICROSCOPIC      Result Value Range   Color, Urine YELLOW  YELLOW   APPearance CLEAR  CLEAR   Specific Gravity, Urine 1.012  1.005 - 1.030   pH 5.0  5.0 - 8.0   Glucose, UA 100 (*) NEGATIVE mg/dL   Hgb urine dipstick TRACE (*) NEGATIVE   Bilirubin Urine NEGATIVE  NEGATIVE   Ketones, ur NEGATIVE  NEGATIVE mg/dL   Protein, ur 213 (*) NEGATIVE mg/dL   Urobilinogen, UA 0.2  0.0 - 1.0 mg/dL   Nitrite NEGATIVE  NEGATIVE   Leukocytes, UA NEGATIVE  NEGATIVE  TROPONIN I      Result Value Range   Troponin I <0.30  <0.30 ng/mL  URINE MICROSCOPIC-ADD ON      Result Value Range   Squamous Epithelial / LPF FEW (*) RARE   WBC, UA 0-2  <3 WBC/hpf   RBC / HPF 0-2  <3 RBC/hpf   Bacteria, UA FEW (*) RARE   Urine-Other MUCOUS PRESENT     Dg Chest 2 View  10/31/2012   *RADIOLOGY REPORT*  Clinical Data: Weakness, dizziness, post fall  CHEST - 2 VIEW  Comparison: 01/23/2012; 06/08/2009; 02/01/2007; chest CT - 01/29/2007  Findings:  Grossly unchanged cardiac silhouette and mediastinal contours with atherosclerotic plaque within the thoracic aorta.  Evaluation of retrosternal clear space is obscured secondary to overlying soft tissues.  There is chronic pulmonary venous congestion without frank evidence of edema.  No focal airspace opacity.  No pleural effusion or pneumothorax.  Grossly  unchanged bones including sequela of bilateral rotator cuff repair and lower cervical ACDF and paraspinal fusion, incompletely evaluated.  IMPRESSION: Chronic pulmonary venous congestion without acute cardiopulmonary disease.   Original Report Authenticated By: Tacey Ruiz, MD   Ct Head Wo Contrast  10/31/2012   *RADIOLOGY REPORT*  Clinical Data:  Status post fall with a laceration in the nose. Weakness.  CT HEAD WITHOUT CONTRAST CT MAXILLOFACIAL WITHOUT CONTRAST CT CERVICAL SPINE WITHOUT CONTRAST  Technique:  Multidetector CT imaging of the head, cervical spine, and maxillofacial structures were performed using the standard protocol without intravenous contrast.  Multiplanar CT image reconstructions of the cervical spine and maxillofacial structures were also generated.  Comparison:  Cervical spine CT scan 08/15/2006.  CT HEAD  Findings: There is fairly extensive hypoattenuation in the subcortical and periventricular deep white matter compatible with chronic microvascular ischemic change.  No evidence of acute abnormality including infarction, hemorrhage, mass lesion, mass effect, midline shift or abnormal extra-axial fluid collection is identified.  There is no hydrocephalus or pneumocephalus.  The calvarium is intact.  IMPRESSION: No acute intracranial abnormality.  Chronic microvascular ischemic change.  CT MAXILLOFACIAL  Findings:  No facial bone fracture is identified.  The globes are intact.  The patient is status post bilateral lens extraction. There is near complete opacification of the left maxillary sinus with high attenuation material present most consistent with chronic sinusitis.  The paranasal sinuses are otherwise clear.  Orbital fat appears normal.  No foreign body is identified.  IMPRESSION:  1.  Negative for facial bone fracture. 2.  Chronic-appearing left maxillary sinus disease.  CT CERVICAL SPINE  Findings:   Again seen is postoperative change of C5-7 ACDF.  Since the prior study, the  patient has undergone posterior fusion at these levels.  There is solid bridging bone across the disc interspaces and posterior elements.  Hardware is intact.  There is no fracture or subluxation of the cervical spine.  Lung apices are clear.  IMPRESSION:  1.  No acute abnormality. 2.  Status post C5-7 fusion.   Original Report Authenticated By: Holley Dexter, M.D.   Ct Cervical Spine Wo Contrast  10/31/2012   *RADIOLOGY REPORT*  Clinical Data:  Status post fall with a laceration in the nose. Weakness.  CT HEAD WITHOUT CONTRAST CT MAXILLOFACIAL WITHOUT CONTRAST CT CERVICAL SPINE WITHOUT CONTRAST  Technique:  Multidetector CT imaging of the head, cervical spine, and maxillofacial structures were performed using the standard protocol without intravenous contrast. Multiplanar CT image reconstructions of the cervical spine and maxillofacial structures were also generated.  Comparison:  Cervical spine CT scan 08/15/2006.  CT HEAD  Findings: There is fairly extensive hypoattenuation in the subcortical and periventricular deep white matter compatible with chronic microvascular ischemic change.  No evidence of acute abnormality including infarction, hemorrhage, mass lesion, mass effect, midline shift or abnormal extra-axial fluid collection is identified.  There is no hydrocephalus or pneumocephalus.  The calvarium is intact.  IMPRESSION: No acute intracranial abnormality.  Chronic microvascular ischemic change.  CT MAXILLOFACIAL  Findings:  No facial bone fracture is identified.  The globes are intact.  The patient is status post bilateral lens extraction. There is near complete opacification of the left maxillary sinus with high attenuation material present most consistent with chronic sinusitis.  The paranasal sinuses are otherwise clear.  Orbital fat appears normal.  No foreign body is identified.  IMPRESSION:  1.  Negative for facial bone fracture. 2.  Chronic-appearing left maxillary sinus disease.  CT CERVICAL  SPINE  Findings:   Again seen is postoperative change of C5-7 ACDF.  Since the prior study, the patient has undergone posterior fusion at these levels.  There is solid bridging bone across the disc interspaces and posterior elements.  Hardware is intact.  There is no fracture or subluxation of the cervical spine.  Lung apices are clear.  IMPRESSION:  1.  No acute abnormality. 2.  Status post C5-7 fusion.   Original Report Authenticated By: Holley Dexter, M.D.   Ct Maxillofacial Wo Cm  10/31/2012   *RADIOLOGY REPORT*  Clinical Data:  Status post  fall with a laceration in the nose. Weakness.  CT HEAD WITHOUT CONTRAST CT MAXILLOFACIAL WITHOUT CONTRAST CT CERVICAL SPINE WITHOUT CONTRAST  Technique:  Multidetector CT imaging of the head, cervical spine, and maxillofacial structures were performed using the standard protocol without intravenous contrast. Multiplanar CT image reconstructions of the cervical spine and maxillofacial structures were also generated.  Comparison:  Cervical spine CT scan 08/15/2006.  CT HEAD  Findings: There is fairly extensive hypoattenuation in the subcortical and periventricular deep white matter compatible with chronic microvascular ischemic change.  No evidence of acute abnormality including infarction, hemorrhage, mass lesion, mass effect, midline shift or abnormal extra-axial fluid collection is identified.  There is no hydrocephalus or pneumocephalus.  The calvarium is intact.  IMPRESSION: No acute intracranial abnormality.  Chronic microvascular ischemic change.  CT MAXILLOFACIAL  Findings:  No facial bone fracture is identified.  The globes are intact.  The patient is status post bilateral lens extraction. There is near complete opacification of the left maxillary sinus with high attenuation material present most consistent with chronic sinusitis.  The paranasal sinuses are otherwise clear.  Orbital fat appears normal.  No foreign body is identified.  IMPRESSION:  1.  Negative  for facial bone fracture. 2.  Chronic-appearing left maxillary sinus disease.  CT CERVICAL SPINE  Findings:   Again seen is postoperative change of C5-7 ACDF.  Since the prior study, the patient has undergone posterior fusion at these levels.  There is solid bridging bone across the disc interspaces and posterior elements.  Hardware is intact.  There is no fracture or subluxation of the cervical spine.  Lung apices are clear.  IMPRESSION:  1.  No acute abnormality. 2.  Status post C5-7 fusion.   Original Report Authenticated By: Holley Dexter, M.D.       Date: 10/31/2012  Rate: 57  Rhythm: sinus bradycardia  QRS Axis: left  Intervals: PR prolonged  ST/T Wave abnormalities: normal  Conduction Disutrbances:first-degree A-V block , right bundle branch block and left anterior fascicular block  Narrative Interpretation: Sinus bradycardia, first degree AV block, right bundle branch block, left anterior fascicular block. When compared with ECG of 01/23/2012, no significant changes are seen.  Old EKG Reviewed: unchanged    1. Fall from chair, initial encounter   2. Contusion, nose, initial encounter   3. Laceration of nose, initial encounter   4. Hyperkalemia   5. Renal insufficiency       MDM  Fall with facial injury. She will be sent for CT scans. Weakness of uncertain cause. She is on a diuretic, so electrolytes will be checked. She will also have chest x-ray and urinalysis to look for occult infection.   CT shows no acute injury and urinalysis is negative, chest x-ray shows no pneumonia. However, she does show significant worsening of renal function compared with baseline with BUN rising to 46 and creatinine to 1.64 from baseline of 22 and 1.1. Also noted is hyperkalemia. She shows no ECG evidence of hyperkalemia. She's given a dose of Kayexalate. Case is discussed with Dr. Ardyth Harps of [facility] who agrees to admit the patient.      Dione Booze, MD 10/31/12 [phone]

## 2012-10-31 NOTE — H&P (Signed)
[facility]          History and Physical    PCP:   Sally Raider, MD   Chief Complaint:  Fall from wheelchair, weakness  HPI: Patient is a 78 y/o woman who was sent from her ALF today after she sustained a fall from her wheelchair. Patient states she has been weaker than normal over the past week or so, she bent forward to get something off the floor and toppled to the ground. She denies dizziness or LOC. Work up in the ED is significant for acute on CKD as well as hyperkalemia of 5.9. We have been asked to admit her for further evaluation and management.   Allergies:   Allergies  Allergen Reactions  . Penicillins Hives  . Sulfur     Break out       Past Medical History  Diagnosis Date  . Diabetes mellitus   . Hypertension   . Neuropathy     History reviewed. No pertinent past surgical history.  Prior to Admission medications   Medication Sig Start Date End Date Taking? Authorizing Provider  albuterol (PROVENTIL) (5 MG/ML) 0.5% nebulizer solution Take 0.5 mLs (2.5 mg total) by nebulization every 2 (two) hours as needed for wheezing or shortness of breath. 01/27/12 01/26/13 Yes Renae Fickle, MD  amitriptyline (ELAVIL) 25 MG tablet Take 25 mg by mouth at bedtime.   Yes Historical Provider, MD  amLODipine (NORVASC) 10 MG tablet Take 10 mg by mouth daily.   Yes Historical Provider, MD  aspirin 81 MG chewable tablet Chew 81 mg by mouth daily.   Yes Historical Provider, MD  carvedilol (COREG) 25 MG tablet Take 25 mg by mouth 2 (two) times daily with a meal.   Yes Historical Provider, MD  cloNIDine (CATAPRES) 0.2 MG tablet Take 1 tablet (0.2 mg total) by mouth 2 (two) times daily. 01/27/12 01/26/13 Yes Renae Fickle, MD  Fluticasone-Salmeterol (ADVAIR) 100-50 MCG/DOSE AEPB Inhale 1 puff into the lungs every 12 (twelve) hours.   Yes Historical Provider, MD  furosemide (LASIX) 20 MG tablet Take 20 mg by mouth daily.   Yes Historical Provider, MD  gabapentin  (NEURONTIN) 100 MG capsule Take 100 mg by mouth 2 (two) times daily.   Yes Historical Provider, MD  gabapentin (NEURONTIN) 100 MG capsule Take 300 mg by mouth at bedtime.   Yes Historical Provider, MD  HYDROcodone-acetaminophen (NORCO/VICODIN) 5-325 MG per tablet Take 1 tablet by mouth every 6 (six) hours as needed for pain.   Yes Historical Provider, MD  insulin glargine (LANTUS) 100 UNIT/ML injection Inject 49 Units into the skin at bedtime.   Yes Historical Provider, MD  insulin lispro (HUMALOG) 100 UNIT/ML injection Inject 10 Units into the skin daily. Hold if fsbs below 250   Yes Historical Provider, MD  ipratropium-albuterol (DUONEB) 0.5-2.5 (3) MG/3ML SOLN Take 3 mLs by nebulization every 6 (six) hours as needed. 01/27/12  Yes Renae Fickle, MD  levothyroxine (SYNTHROID, LEVOTHROID) 50 MCG tablet Take 50 mcg by mouth daily.   Yes Historical Provider, MD  losartan (COZAAR) 50 MG tablet Take 50 mg by mouth daily.   Yes Historical Provider, MD  Multiple Vitamins-Minerals (HEALTHY EYES) TABS Take 1 tablet by mouth daily.   Yes Historical Provider, MD  potassium chloride SA (K-DUR,KLOR-CON) 20 MEQ tablet Take 20 mEq by mouth daily.   Yes Historical Provider, MD  rosuvastatin (CRESTOR) 20 MG tablet Take 20 mg by mouth daily.   Yes Historical Provider, MD    Social  History:  reports that she has never smoked. She does not have any smokeless tobacco history on file. She reports that she uses illicit drugs about twice per week. Her alcohol history is not on file.  History reviewed. No pertinent family history.  Review of Systems:  Constitutional: Denies fever, chills, diaphoresis, appetite change and fatigue.  HEENT: Denies photophobia, eye pain, redness, hearing loss, ear pain, congestion, sore throat, rhinorrhea, sneezing, mouth sores, trouble swallowing, neck pain, neck stiffness and tinnitus.   Respiratory: Denies SOB, DOE, cough, chest tightness,  and wheezing.   Cardiovascular: Denies  chest pain, palpitations and leg swelling.  Gastrointestinal: Denies nausea, vomiting, abdominal pain, diarrhea, constipation, blood in stool and abdominal distention.  Genitourinary: Denies dysuria, urgency, frequency, hematuria, flank pain and difficulty urinating.  Endocrine: Denies: hot or cold intolerance, sweats, changes in hair or nails, polyuria, polydipsia. Musculoskeletal: Denies myalgias, back pain, joint swelling, arthralgias and gait problem.  Skin: Denies pallor, rash and wound.  Neurological: Denies dizziness, seizures, syncope, weakness, light-headedness, numbness and headaches.  Hematological: Denies adenopathy. Easy bruising, personal or family bleeding history  Psychiatric/Behavioral: Denies suicidal ideation, mood changes, confusion, nervousness, sleep disturbance and agitation   Physical Exam: Blood pressure 149/74, pulse 57, temperature 97.8 F (36.6 C), temperature source Oral, resp. rate 14, SpO2 100.00%. Gen: AA Ox3, NAD HEENT: /AT/PERL/EOMI/dry mucous membranes. Neck: supple, no JVD, no LAD, no bruits, no goiter CV: RRR, no M/R/G Lungs: CTA B Abd: S/NT/ND/+BS/no masses or organomegaly noted. Ext: 2-3+ pitting edema R>L Neuro: Grossly intact and non-focal.  Labs on Admission:  Results for orders placed during the hospital encounter of 10/31/12 (from the past 48 hour(s))  CBC WITH DIFFERENTIAL     Status: Abnormal   Collection Time    10/31/12 12:05 PM      Result Value Range   WBC 10.8 (*) 4.0 - 10.5 K/uL   RBC 3.83 (*) 3.87 - 5.11 MIL/uL   Hemoglobin 11.1 (*) 12.0 - 15.0 g/dL   HCT 14.7 (*) 82.9 - 56.2 %   MCV 88.8  78.0 - 100.0 fL   MCH 29.0  26.0 - 34.0 pg   MCHC 32.6  30.0 - 36.0 g/dL   RDW 13.0  86.5 - 78.4 %   Platelets 177  150 - 400 K/uL   Neutrophils Relative % 62  43 - 77 %   Neutro Abs 6.7  1.7 - 7.7 K/uL   Lymphocytes Relative 28  12 - 46 %   Lymphs Abs 3.0  0.7 - 4.0 K/uL   Monocytes Relative 8  3 - 12 %   Monocytes Absolute 0.9  0.1  - 1.0 K/uL   Eosinophils Relative 3  0 - 5 %   Eosinophils Absolute 0.3  0.0 - 0.7 K/uL   Basophils Relative 0  0 - 1 %   Basophils Absolute 0.0  0.0 - 0.1 K/uL  COMPREHENSIVE METABOLIC PANEL     Status: Abnormal   Collection Time    10/31/12 12:05 PM      Result Value Range   Sodium 138  135 - 145 mEq/L   Potassium 5.9 (*) 3.5 - 5.1 mEq/L   Chloride 108  96 - 112 mEq/L   CO2 20  19 - 32 mEq/L   Glucose, Bld 161 (*) 70 - 99 mg/dL   BUN 46 (*) 6 - 23 mg/dL   Creatinine, Ser 6.96 (*) 0.50 - 1.10 mg/dL   Calcium 9.6  8.4 - 29.5 mg/dL   Total  Protein 6.5  6.0 - 8.3 g/dL   Albumin 3.2 (*) 3.5 - 5.2 g/dL   AST 15  0 - 37 U/L   ALT 12  0 - 35 U/L   Alkaline Phosphatase 56  39 - 117 U/L   Total Bilirubin 0.1 (*) 0.3 - 1.2 mg/dL   GFR calc non Af Amer 30 (*) >90 mL/min   GFR calc Af Amer 34 (*) >90 mL/min   Comment:            The eGFR has been calculated     using the CKD EPI equation.     This calculation has not been     validated in all clinical     situations.     eGFR's persistently     <90 mL/min signify     possible Chronic Kidney Disease.  TROPONIN I     Status: None   Collection Time    10/31/12 12:05 PM      Result Value Range   Troponin I <0.30  <0.30 ng/mL   Comment:            Due to the release kinetics of cTnI,     a negative result within the first hours     of the onset of symptoms does not rule out     myocardial infarction with certainty.     If myocardial infarction is still suspected,     repeat the test at appropriate intervals.  URINALYSIS, ROUTINE W REFLEX MICROSCOPIC     Status: Abnormal   Collection Time    10/31/12  2:33 PM      Result Value Range   Color, Urine YELLOW  YELLOW   APPearance CLEAR  CLEAR   Specific Gravity, Urine 1.012  1.005 - 1.030   pH 5.0  5.0 - 8.0   Glucose, UA 100 (*) NEGATIVE mg/dL   Hgb urine dipstick TRACE (*) NEGATIVE   Bilirubin Urine NEGATIVE  NEGATIVE   Ketones, ur NEGATIVE  NEGATIVE mg/dL   Protein, ur 161 (*)  NEGATIVE mg/dL   Urobilinogen, UA 0.2  0.0 - 1.0 mg/dL   Nitrite NEGATIVE  NEGATIVE   Leukocytes, UA NEGATIVE  NEGATIVE  URINE MICROSCOPIC-ADD ON     Status: Abnormal   Collection Time    10/31/12  2:33 PM      Result Value Range   Squamous Epithelial / LPF FEW (*) RARE   WBC, UA 0-2  <3 WBC/hpf   RBC / HPF 0-2  <3 RBC/hpf   Bacteria, UA FEW (*) RARE   Urine-Other MUCOUS PRESENT      Radiological Exams on Admission: Dg Chest 2 View  10/31/2012   *RADIOLOGY REPORT*  Clinical Data: Weakness, dizziness, post fall  CHEST - 2 VIEW  Comparison: 01/23/2012; 06/08/2009; 02/01/2007; chest CT - 01/29/2007  Findings:  Grossly unchanged cardiac silhouette and mediastinal contours with atherosclerotic plaque within the thoracic aorta.  Evaluation of retrosternal clear space is obscured secondary to overlying soft tissues.  There is chronic pulmonary venous congestion without frank evidence of edema.  No focal airspace opacity.  No pleural effusion or pneumothorax.  Grossly unchanged bones including sequela of bilateral rotator cuff repair and lower cervical ACDF and paraspinal fusion, incompletely evaluated.  IMPRESSION: Chronic pulmonary venous congestion without acute cardiopulmonary disease.   Original Report Authenticated By: Tacey Ruiz, MD   Ct Head Wo Contrast  10/31/2012   *RADIOLOGY REPORT*  Clinical Data:  Status post fall with a  laceration in the nose. Weakness.  CT HEAD WITHOUT CONTRAST CT MAXILLOFACIAL WITHOUT CONTRAST CT CERVICAL SPINE WITHOUT CONTRAST  Technique:  Multidetector CT imaging of the head, cervical spine, and maxillofacial structures were performed using the standard protocol without intravenous contrast. Multiplanar CT image reconstructions of the cervical spine and maxillofacial structures were also generated.  Comparison:  Cervical spine CT scan 08/15/2006.  CT HEAD  Findings: There is fairly extensive hypoattenuation in the subcortical and periventricular deep white matter  compatible with chronic microvascular ischemic change.  No evidence of acute abnormality including infarction, hemorrhage, mass lesion, mass effect, midline shift or abnormal extra-axial fluid collection is identified.  There is no hydrocephalus or pneumocephalus.  The calvarium is intact.  IMPRESSION: No acute intracranial abnormality.  Chronic microvascular ischemic change.  CT MAXILLOFACIAL  Findings:  No facial bone fracture is identified.  The globes are intact.  The patient is status post bilateral lens extraction. There is near complete opacification of the left maxillary sinus with high attenuation material present most consistent with chronic sinusitis.  The paranasal sinuses are otherwise clear.  Orbital fat appears normal.  No foreign body is identified.  IMPRESSION:  1.  Negative for facial bone fracture. 2.  Chronic-appearing left maxillary sinus disease.  CT CERVICAL SPINE  Findings:   Again seen is postoperative change of C5-7 ACDF.  Since the prior study, the patient has undergone posterior fusion at these levels.  There is solid bridging bone across the disc interspaces and posterior elements.  Hardware is intact.  There is no fracture or subluxation of the cervical spine.  Lung apices are clear.  IMPRESSION:  1.  No acute abnormality. 2.  Status post C5-7 fusion.   Original Report Authenticated By: Holley Dexter, M.D.   Ct Cervical Spine Wo Contrast  10/31/2012   *RADIOLOGY REPORT*  Clinical Data:  Status post fall with a laceration in the nose. Weakness.  CT HEAD WITHOUT CONTRAST CT MAXILLOFACIAL WITHOUT CONTRAST CT CERVICAL SPINE WITHOUT CONTRAST  Technique:  Multidetector CT imaging of the head, cervical spine, and maxillofacial structures were performed using the standard protocol without intravenous contrast. Multiplanar CT image reconstructions of the cervical spine and maxillofacial structures were also generated.  Comparison:  Cervical spine CT scan 08/15/2006.  CT HEAD  Findings:  There is fairly extensive hypoattenuation in the subcortical and periventricular deep white matter compatible with chronic microvascular ischemic change.  No evidence of acute abnormality including infarction, hemorrhage, mass lesion, mass effect, midline shift or abnormal extra-axial fluid collection is identified.  There is no hydrocephalus or pneumocephalus.  The calvarium is intact.  IMPRESSION: No acute intracranial abnormality.  Chronic microvascular ischemic change.  CT MAXILLOFACIAL  Findings:  No facial bone fracture is identified.  The globes are intact.  The patient is status post bilateral lens extraction. There is near complete opacification of the left maxillary sinus with high attenuation material present most consistent with chronic sinusitis.  The paranasal sinuses are otherwise clear.  Orbital fat appears normal.  No foreign body is identified.  IMPRESSION:  1.  Negative for facial bone fracture. 2.  Chronic-appearing left maxillary sinus disease.  CT CERVICAL SPINE  Findings:   Again seen is postoperative change of C5-7 ACDF.  Since the prior study, the patient has undergone posterior fusion at these levels.  There is solid bridging bone across the disc interspaces and posterior elements.  Hardware is intact.  There is no fracture or subluxation of the cervical spine.  Lung apices are  clear.  IMPRESSION:  1.  No acute abnormality. 2.  Status post C5-7 fusion.   Original Report Authenticated By: Holley Dexter, M.D.   Ct Maxillofacial Wo Cm  10/31/2012   *RADIOLOGY REPORT*  Clinical Data:  Status post fall with a laceration in the nose. Weakness.  CT HEAD WITHOUT CONTRAST CT MAXILLOFACIAL WITHOUT CONTRAST CT CERVICAL SPINE WITHOUT CONTRAST  Technique:  Multidetector CT imaging of the head, cervical spine, and maxillofacial structures were performed using the standard protocol without intravenous contrast. Multiplanar CT image reconstructions of the cervical spine and maxillofacial structures  were also generated.  Comparison:  Cervical spine CT scan 08/15/2006.  CT HEAD  Findings: There is fairly extensive hypoattenuation in the subcortical and periventricular deep white matter compatible with chronic microvascular ischemic change.  No evidence of acute abnormality including infarction, hemorrhage, mass lesion, mass effect, midline shift or abnormal extra-axial fluid collection is identified.  There is no hydrocephalus or pneumocephalus.  The calvarium is intact.  IMPRESSION: No acute intracranial abnormality.  Chronic microvascular ischemic change.  CT MAXILLOFACIAL  Findings:  No facial bone fracture is identified.  The globes are intact.  The patient is status post bilateral lens extraction. There is near complete opacification of the left maxillary sinus with high attenuation material present most consistent with chronic sinusitis.  The paranasal sinuses are otherwise clear.  Orbital fat appears normal.  No foreign body is identified.  IMPRESSION:  1.  Negative for facial bone fracture. 2.  Chronic-appearing left maxillary sinus disease.  CT CERVICAL SPINE  Findings:   Again seen is postoperative change of C5-7 ACDF.  Since the prior study, the patient has undergone posterior fusion at these levels.  There is solid bridging bone across the disc interspaces and posterior elements.  Hardware is intact.  There is no fracture or subluxation of the cervical spine.  Lung apices are clear.  IMPRESSION:  1.  No acute abnormality. 2.  Status post C5-7 fusion.   Original Report Authenticated By: Holley Dexter, M.D.    Assessment/Plan Principal Problem:   Weakness generalized Active Problems:   HYPOTHYROIDISM   DM   HYPERTENSION   ARF (acute renal failure)   Hyperkalemia   CKD (chronic kidney disease) stage 3, GFR 30-59 ml/min   Fall   Nasal laceration  Generalized Weakness -Probably worsened by ARF. -UA neg for UTI, CXR - for acute disease. -Check B12/TSH/RPR. -PT/OT consult  requested.  ARF on CKD Stage III -Baseline Cr around 1.02. -Suspect from prerenal azotemia in presence of ARB. -Give IVF, hold ARB and recheck labs in am.  Hyperkalemia -Suspect from ARF. -No acute EKG changes. -Kayexalate given by ED. -Will recheck K in am.  Hypothyroidism -Check TSH. -Continue home dose of synthroid.  DM -Check A1C. -Continue home dose of lantus. -SSI while in the hospital.  HTN -Continue home meds with the exception of ARB due to ARF.  DVT Prophylaxis -Lovenox.     Time Spent on Admission: 75 minutes.  Chaya Jan [facility] Pager: [phone] 10/31/2012, 4:27 PM

## 2012-10-31 NOTE — ED Notes (Signed)
Patient transported to CT 

## 2012-10-31 NOTE — Progress Notes (Signed)
1845  Incontinent of large loose bowel movement . Cleansed and kept dry. No c/o any discomfort NSR 60's  1910 Dinner served ate independently

## 2012-11-01 ENCOUNTER — Encounter (HOSPITAL_COMMUNITY): Payer: Self-pay | Admitting: General Practice

## 2012-11-01 DIAGNOSIS — M6281 Muscle weakness (generalized): Secondary | ICD-10-CM

## 2012-11-01 LAB — GLUCOSE, CAPILLARY
Glucose-Capillary: 95 mg/dL (ref 70–99)
Glucose-Capillary: 118 mg/dL — ABNORMAL HIGH (ref 70–99)
Glucose-Capillary: 236 mg/dL — ABNORMAL HIGH (ref 70–99)

## 2012-11-01 LAB — BASIC METABOLIC PANEL
BUN: 39 mg/dL — ABNORMAL HIGH (ref 6–23)
CO2: 20 mEq/L (ref 19–32)
Chloride: 112 mEq/L (ref 96–112)
Creatinine, Ser: 1.16 mg/dL — ABNORMAL HIGH (ref 0.50–1.10)

## 2012-11-01 LAB — VITAMIN B12: Vitamin B-12: 420 pg/mL (ref 211–911)

## 2012-11-01 LAB — CBC
Hemoglobin: 9.6 g/dL — ABNORMAL LOW (ref 12.0–15.0)
MCH: 28.6 pg (ref 26.0–34.0)
MCHC: 32.5 g/dL (ref 30.0–36.0)
MCV: 87.8 fL (ref 78.0–100.0)

## 2012-11-01 LAB — RPR: RPR Ser Ql: NONREACTIVE

## 2012-11-01 MED ORDER — ENOXAPARIN SODIUM 40 MG/0.4ML ~~LOC~~ SOLN
40.0000 mg | SUBCUTANEOUS | Status: DC
  Administered 2012-11-01: 40 mg via SUBCUTANEOUS
  Filled 2012-11-01 (×2): qty 0.4

## 2012-11-01 MED ORDER — SODIUM POLYSTYRENE SULFONATE 15 GM/60ML PO SUSP
15.0000 g | Freq: Once | ORAL | Status: DC
  Filled 2012-11-01 (×2): qty 60

## 2012-11-01 MED ORDER — SODIUM POLYSTYRENE SULFONATE 15 GM/60ML PO SUSP
15.0000 g | Freq: Once | ORAL | Status: AC
  Administered 2012-11-01: 15 g via RECTAL
  Filled 2012-11-01: qty 60

## 2012-11-01 MED ORDER — SODIUM POLYSTYRENE SULFONATE 15 GM/60ML PO SUSP
15.0000 g | Freq: Once | ORAL | Status: AC
  Administered 2012-11-01: 15 g via ORAL
  Filled 2012-11-01: qty 60

## 2012-11-01 MED ORDER — CLONIDINE HCL 0.2 MG PO TABS
0.2000 mg | ORAL_TABLET | Freq: Three times a day (TID) | ORAL | Status: DC
Start: 2012-11-01 — End: 2012-11-02
  Administered 2012-11-01 – 2012-11-02 (×3): 0.2 mg via ORAL
  Filled 2012-11-01 (×5): qty 1

## 2012-11-01 MED ORDER — HYDRALAZINE HCL 25 MG PO TABS
25.0000 mg | ORAL_TABLET | Freq: Three times a day (TID) | ORAL | Status: DC
  Administered 2012-11-01 – 2012-11-02 (×2): 25 mg via ORAL
  Filled 2012-11-01 (×5): qty 1

## 2012-11-01 MED ORDER — SODIUM CHLORIDE 0.9 % IV SOLN
INTRAVENOUS | Status: AC
  Administered 2012-11-01 (×2): via INTRAVENOUS

## 2012-11-01 NOTE — Progress Notes (Signed)
BP is 170/57. Patient is asymptomatic. No complaints and no signs or symptoms of distress or discomfort. MD notified. New orders given. Will continue to monitor patient for further changes in condition.

## 2012-11-01 NOTE — Evaluation (Signed)
Occupational Therapy Evaluation Patient Details Name: [name] MRN: [id number] DOB: 1934-11-24 Today's Date: 11/01/2012 Time: 4098-1191 OT Time Calculation (min): 21 min  OT Assessment / Plan / Recommendation Clinical Impression  Pt admitted after falling from her w/c at her ALF with hyperkalemia and CKD with laceration on her nose.  Pt uses a w/c as her primary means of mobility and is assisted at the ALF with bathing and dressing, but toilets and grooms independently.  Pt presents requiring min assist for mobility.  Will follow to address toilet transfers and standing grooming activities in preparation for return to ALF.    OT Assessment  Patient needs continued OT Services    Follow Up Recommendations  Supervision/Assistance - 24 hour    Barriers to Discharge      Equipment Recommendations  None recommended by OT    Recommendations for Other Services    Frequency  Min 2X/week    Precautions / Restrictions Precautions Precautions: Fall   Pertinent Vitals/Pain Soreness under L arm from fall.    ADL  Eating/Feeding: Independent Where Assessed - Eating/Feeding: Edge of bed Grooming: Wash/dry hands;Wash/dry face;Set up Where Assessed - Grooming: Unsupported sitting Upper Body Bathing: Minimal assistance Where Assessed - Upper Body Bathing: Unsupported sitting Lower Body Bathing: Maximal assistance Where Assessed - Lower Body Bathing: Unsupported sitting;Supported sit to stand Upper Body Dressing: Set up Where Assessed - Upper Body Dressing: Unsupported sitting Lower Body Dressing: Moderate assistance Where Assessed - Lower Body Dressing: Unsupported sitting;Supported sit to stand Toilet Transfer: Minimal assistance Toilet Transfer Method: Sit to stand Toilet Transfer Equipment: Raised toilet seat with arms (or 3-in-1 over toilet) Toileting - Clothing Manipulation and Hygiene: Minimal assistance Where Assessed - Toileting Clothing Manipulation and Hygiene: Sit to  stand from 3-in-1 or toilet Equipment Used: Rolling walker Transfers/Ambulation Related to ADLs: min assist to ambulate 70 ft with RW, verbal cues for hand placement and for posture ADL Comments: Pt primarily uses w/c for mobility.  ALF assists with showering and LB ADL.  Pt performs grooming and toileting independently.    OT Diagnosis: Generalized weakness;Acute pain  OT Problem List: Decreased strength;Decreased activity tolerance;Impaired balance (sitting and/or standing);Decreased knowledge of use of DME or AE;Obesity;Pain OT Treatment Interventions: Self-care/ADL training;Therapeutic activities;Balance training;Patient/family education   OT Goals Acute Rehab OT Goals OT Goal Formulation: With patient Time For Goal Achievement: 11/08/12 Potential to Achieve Goals: Good ADL Goals Pt Will Perform Grooming: with modified independence;Standing at sink ADL Goal: Grooming - Progress: Goal set today Pt Will Transfer to Toilet: with modified independence;Stand pivot transfer;3-in-1 ADL Goal: Toilet Transfer - Progress: Goal set today Pt Will Perform Toileting - Clothing Manipulation: with modified independence;Standing ADL Goal: Toileting - Clothing Manipulation - Progress: Goal set today Pt Will Perform Toileting - Hygiene: with modified independence;Sit to stand from 3-in-1/toilet ADL Goal: Toileting - Hygiene - Progress: Goal set today  Visit Information  Last OT Received On: 11/01/12 Assistance Needed: +1 PT/OT Co-Evaluation/Treatment: Yes    Subjective Data  Subjective: "They don't let me walk at the assisted living, I push myself around in my w/c." Patient Stated Goal: Return to PLOF and ALF.   Prior Functioning     Home Living Lives With:  (ALF) Available Help at Discharge: Available 24 hours/day (ALF) Type of Home: Assisted living Home Access: Level entry Home Layout: One level Bathroom Shower/Tub: Health visitor: Standard Bathroom Accessibility:  Yes How Accessible: Accessible via wheelchair Home Adaptive Equipment: Wheelchair - manual;Bedside commode/3-in-1;Walker - rolling;Hospital bed (  no rail on bed) Prior Function Level of Independence: Needs assistance Needs Assistance: Bathing;Dressing;Meal Prep;Light Housekeeping;Transfers;Gait Bath: Moderate Dressing: Minimal (LB dressing) Meal Prep: Total Light Housekeeping: Total Gait Assistance: min with RW Transfer Assistance: transfers herself in and out of w/c, assisted for shower transfer Able to Take Stairs?: No Driving: No Communication Communication: No difficulties Dominant Hand: Right         Vision/Perception Vision - History Visual History: Macular degeneration Patient Visual Report: No change from baseline   Cognition  Cognition Arousal/Alertness: Awake/alert Behavior During Therapy: WFL for tasks assessed/performed Overall Cognitive Status: Within Functional Limits for tasks assessed    Extremity/Trunk Assessment Right Upper Extremity Assessment RUE ROM/Strength/Tone: [facility] for tasks assessed Left Upper Extremity Assessment LUE ROM/Strength/Tone: WFL for tasks assessed Trunk Assessment Trunk Assessment: Kyphotic     Mobility Bed Mobility Bed Mobility: Rolling Right;Right Sidelying to Sit;Sitting - Scoot to [place] of Bed Rolling Right: 4: Min assist Right Sidelying to Sit: 4: Min assist;HOB elevated Sitting - Scoot to [place] of Bed: 5: Supervision Details for Bed Mobility Assistance: verbal cues to roll onto R side to avoid using sore muscles under the L arm Transfers Transfers: Sit to Stand;Stand to Sit Sit to Stand: 4: Min assist;With upper extremity assist;From bed Stand to Sit: 4: Min assist;With upper extremity assist;To chair/3-in-1 Details for Transfer Assistance: verbal cues for hand placement      Exercise     Balance Balance Balance Assessed: Yes Static Sitting Balance Static Sitting - Balance Support: Feet supported Static Sitting -  Level of Assistance: 5: Stand by assistance Static Sitting - Comment/# of Minutes: 5 Static Standing Balance Static Standing - Balance Support: Bilateral upper extremity supported Static Standing - Level of Assistance: 5: Stand by assistance   End of Session OT - End of Session Activity Tolerance: Patient limited by fatigue Patient left: in chair;with call bell/phone within reach Nurse Communication: Mobility status  GO     Evern Bio 11/01/2012, 12:19 PM [phone]

## 2012-11-01 NOTE — Plan of Care (Signed)
Problem: Phase I Progression Outcomes Goal: Pain controlled with appropriate interventions Outcome: Progressing Patient admitted for follow up of electrolytes and lab values after fall.  Receiving normal saline at 53ml/hr.  Electrolytes will be rechecked with am labs.  No complications observed this shift.  Will continue to monitor patient condition.

## 2012-11-01 NOTE — Evaluation (Signed)
Physical Therapy Evaluation Patient Details Name: [name] MRN: [id number] DOB: August 21, 1934 Today's Date: 11/01/2012 Time: 4098-1191 PT Time Calculation (min): 21 min  PT Assessment / Plan / Recommendation Clinical Impression  Pt admitted after falling from her w/c at her ALF with hyperkalemia and CKD with laceration on her nose.  Pt uses a w/c as her primary means of mobility and is assisted at the ALF with bathing and dressing, but toilets and grooms independently.  On eval,pt presents requiring min assist for mobility.  Pt will benefit from PT to maximize function in preparation for return to ALF.    PT Assessment  Patient needs continued PT services    Follow Up Recommendations  Home health PT;Supervision for mobility/OOB    Does the patient have the potential to tolerate intense rehabilitation      Barriers to Discharge        Equipment Recommendations  None recommended by PT    Recommendations for Other Services     Frequency Min 3X/week    Precautions / Restrictions Precautions Precautions: Fall   Pertinent Vitals/Pain       Mobility  Bed Mobility Bed Mobility: Rolling Right;Right Sidelying to Sit;Sitting - Scoot to [place] of Bed Rolling Right: 4: Min assist Right Sidelying to Sit: 4: Min assist;HOB elevated Sitting - Scoot to [place] of Bed: 5: Supervision Details for Bed Mobility Assistance: verbal cues to roll onto R side to avoid using sore muscles under the L arm Transfers Transfers: Sit to Stand;Stand to Sit Sit to Stand: 4: Min assist;With upper extremity assist;From bed Stand to Sit: 4: Min assist;With upper extremity assist;To chair/3-in-1 Details for Transfer Assistance: verbal cues for hand placement  Ambulation/Gait Ambulation/Gait Assistance: 4: Min assist Ambulation Distance (Feet): 70 Feet Assistive device: Rolling walker Ambulation/Gait Assistance Details: generally steady with RW, but noticeably more unsteady with fatigue Gait Pattern:  Step-through pattern Gait velocity: slower Stairs: No Wheelchair Mobility Wheelchair Mobility: No    Exercises     PT Diagnosis: Generalized weakness  PT Problem List: Decreased strength;Decreased activity tolerance;Decreased balance;Decreased mobility;Decreased knowledge of use of DME PT Treatment Interventions: DME instruction;Gait training;Functional mobility training;Therapeutic activities;Balance training;Patient/family education   PT Goals Acute Rehab PT Goals PT Goal Formulation: With patient Time For Goal Achievement: 11/08/12 Potential to Achieve Goals: Good Pt will go Supine/Side to Sit: with modified independence PT Goal: Supine/Side to Sit - Progress: Goal set today Pt will go Sit to Stand: with modified independence PT Goal: Sit to Stand - Progress: Goal set today Pt will Transfer Bed to Chair/Chair to Bed: with modified independence PT Transfer Goal: Bed to Chair/Chair to Bed - Progress: Goal set today Pt will Ambulate: 51 - 150 feet;with supervision;with least restrictive assistive device PT Goal: Ambulate - Progress: Goal set today  Visit Information  Last PT Received On: 11/01/12 Assistance Needed: +1    Subjective Data  Subjective: They don't help much there. Patient Stated Goal: Back to (ALF) and able to do for myself as much as possible   Prior Functioning  Home Living Lives With: Other (Comment) (ALF) Available Help at Discharge: Available 24 hours/day;Other (Comment) (ALF) Type of Home: Assisted living Home Access: Level entry Home Layout: One level Bathroom Shower/Tub: Health visitor: Standard Bathroom Accessibility: Yes How Accessible: Accessible via wheelchair Home Adaptive Equipment: Wheelchair - manual;Bedside commode/3-in-1;Walker - rolling;Hospital bed (no rail on bed) Prior Function Level of Independence: Needs assistance Needs Assistance: Bathing;Dressing;Meal Prep;Light Housekeeping;Transfers;Gait Bath:  Moderate Dressing: Minimal (LB dressing) Meal  Prep: Total Light Housekeeping: Total Gait Assistance: min with RW Transfer Assistance: transfers herself in and out of w/c, assisted for shower transfer Able to Take Stairs?: No Driving: No Communication Communication: No difficulties Dominant Hand: Right    Cognition  Cognition Arousal/Alertness: Awake/alert Behavior During Therapy: WFL for tasks assessed/performed Overall Cognitive Status: Within Functional Limits for tasks assessed    Extremity/Trunk Assessment Right Upper Extremity Assessment RUE ROM/Strength/Tone: [facility] for tasks assessed Left Upper Extremity Assessment LUE ROM/Strength/Tone: WFL for tasks assessed Right Lower Extremity Assessment RLE ROM/Strength/Tone: Deficits RLE ROM/Strength/Tone Deficits: Bil weak  distal notably weaker that proximal bil. Left Lower Extremity Assessment LLE ROM/Strength/Tone: Deficits LLE ROM/Strength/Tone Deficits: See R LE Trunk Assessment Trunk Assessment: Kyphotic   Balance Balance Balance Assessed: Yes Static Sitting Balance Static Sitting - Balance Support: Feet supported Static Sitting - Level of Assistance: 5: Stand by assistance Static Sitting - Comment/# of Minutes: 5 Static Standing Balance Static Standing - Balance Support: Bilateral upper extremity supported Static Standing - Level of Assistance: 5: Stand by assistance  End of Session PT - End of Session Activity Tolerance: Patient tolerated treatment well;Patient limited by fatigue Patient left: in chair;with call bell/phone within reach Nurse Communication: Mobility status  GP     Helana Macbride, Eliseo Gum 11/01/2012, 12:59 PM 11/01/2012  [place], PT [phone]  (pager)

## 2012-11-01 NOTE — Progress Notes (Signed)
[facility]                                                                                Patient Demographics  [name], is a 78 y.o. female, DOB - 1935/02/05, YQM:[id number], BMW:[id number]  Admit date - 10/31/2012  Admitting Physician Henderson Cloud, MD  Outpatient Primary MD for the patient is Lupita Raider, MD  LOS - 1   Chief Complaint  Patient presents with  . Fall        Assessment & Plan    Generalized Weakness acute on chronic patient chronically wheelchair-bound causing trip and fall. -Due to dehydration.  -UA neg for UTI, CXR - for acute disease.  -Stable B12/TSH/RPR.  -PT/OT consult requested. Continue gentle IV fluids she already feels better. Head CT and C-spine CT unremarkable.    ARF on CKD Stage III  -Baseline Cr around 1.02.  -Suspect from prerenal azotemia in presence of ARB.  -Give IVF, hold ARB and acute renal failure is improving.   Hyperkalemia  -Suspect from ARF.  -No acute EKG changes.  -Kayexalate repeated orally and rectally, high potassium persisting due to ARB use, repeat potassium in the afternoon and again in the morning.    Hypothyroidism  -Stable TSH.  -Continue home dose of synthroid.     DM  -Continue home dose of lantus long with sliding scale insulin  Lab Results  Component Value Date   HGBA1C 7.5* 10/31/2012    CBG (last 3)   Recent Labs  10/31/12 2110 11/01/12 0605  GLUCAP 237* 95       HTN  -Continue home meds with the exception of ARB due to ARF.       Code Status: full  Family Communication: none present  Disposition Plan: home   Procedures CT head and C Spine   Consults      DVT Prophylaxis  Lovenox    Lab Results  Component Value Date   PLT 181 11/01/2012    Medications  Scheduled Meds: . amitriptyline  25 mg Oral QHS  . amLODipine  10 mg Oral Daily  . aspirin  81 mg Oral Daily  . atorvastatin  10 mg Oral q1800  . carvedilol  25 mg Oral BID WC  .  cloNIDine  0.2 mg Oral BID  . enoxaparin (LOVENOX) injection  40 mg Subcutaneous Q24H  . furosemide  20 mg Oral Daily  . gabapentin  100 mg Oral q morning - 10a  . gabapentin  400 mg Oral QHS  . insulin aspart  0-15 Units Subcutaneous TID WC  . insulin aspart  4 Units Subcutaneous TID WC  . insulin glargine  49 Units Subcutaneous QHS  . levothyroxine  50 mcg Oral QAC breakfast  . mometasone-formoterol  2 puff Inhalation BID  . multivitamin  1 tablet Oral Daily  . sodium chloride  3 mL Intravenous Q12H   Continuous Infusions: . sodium chloride 75 mL/hr at 11/01/12 0442   PRN Meds:.acetaminophen, acetaminophen, alum & mag hydroxide-simeth, ondansetron (ZOFRAN) IV, ondansetron, oxyCODONE, senna-docusate  Antibiotics     Anti-infectives   None       Time Spent in minutes   35  Leroy Sea M.D on 11/01/2012 at 11:38 AM  Between 7am to 7pm - Pager - [phone]  After 7pm go to [email or web address] - password [email or web address]  And look for the night coverage person covering for me after hours  [facility] Office  [phone]    Subjective:   [name] today has, No headache, No chest pain, No abdominal pain - No Nausea, No new weakness tingling or numbness, No Cough - SOB.    Objective:   Filed Vitals:   10/31/12 2029 11/01/12 0014 11/01/12 0543 11/01/12 1027  BP: 160/57 168/42 138/40 149/47  Pulse: 64 70 58 57  Temp: 98.4 F (36.9 C)  97.2 F (36.2 C)   TempSrc: Oral  Oral   Resp: 18 18 18    Height:      Weight:   85.322 kg (188 lb 1.6 oz)   SpO2: 96% 95% 95%     Wt Readings from Last 3 Encounters:  11/01/12 85.322 kg (188 lb 1.6 oz)  01/24/12 84.006 kg (185 lb 3.2 oz)     Intake/Output Summary (Last 24 hours) at 11/01/12 1138 Last data filed at 11/01/12 1028  Gross per 24 hour  Intake 1875.5 ml  Output      0 ml  Net 1875.5 ml    Exam Awake Alert, Oriented X 3, No new F.N deficits, Normal affect, chronic lower extremity  weakness  bilateral [place].AT,PERRAL, small laceration on the bridge of the nose which is healing well Supple Neck,No JVD, No cervical lymphadenopathy appriciated.  Symmetrical Chest wall movement, Good air movement bilaterally, CTAB RRR,No Gallops,Rubs or new Murmurs, No Parasternal Heave +ve B.Sounds, Abd Soft, Non tender, No organomegaly appriciated, No rebound - guarding or rigidity. No Cyanosis, Clubbing or edema, No new Rash or bruise     Data Review   Micro Results No results found for this or any previous visit (from the past 240 hour(s)).  Radiology Reports Dg Chest 2 View  10/31/2012   *RADIOLOGY REPORT*  Clinical Data: Weakness, dizziness, post fall  CHEST - 2 VIEW  Comparison: 01/23/2012; 06/08/2009; 02/01/2007; chest CT - 01/29/2007  Findings:  Grossly unchanged cardiac silhouette and mediastinal contours with atherosclerotic plaque within the thoracic aorta.  Evaluation of retrosternal clear space is obscured secondary to overlying soft tissues.  There is chronic pulmonary venous congestion without frank evidence of edema.  No focal airspace opacity.  No pleural effusion or pneumothorax.  Grossly unchanged bones including sequela of bilateral rotator cuff repair and lower cervical ACDF and paraspinal fusion, incompletely evaluated.  IMPRESSION: Chronic pulmonary venous congestion without acute cardiopulmonary disease.   Original Report Authenticated By: Tacey Ruiz, MD   Ct Head Wo Contrast  10/31/2012   *RADIOLOGY REPORT*  Clinical Data:  Status post fall with a laceration in the nose. Weakness.  CT HEAD WITHOUT CONTRAST CT MAXILLOFACIAL WITHOUT CONTRAST CT CERVICAL SPINE WITHOUT CONTRAST  Technique:  Multidetector CT imaging of the head, cervical spine, and maxillofacial structures were performed using the standard protocol without intravenous contrast. Multiplanar CT image reconstructions of the cervical spine and maxillofacial structures were also generated.  Comparison:  Cervical spine CT  scan 08/15/2006.  CT HEAD  Findings: There is fairly extensive hypoattenuation in the subcortical and periventricular deep white matter compatible with chronic microvascular ischemic change.  No evidence of acute abnormality including infarction, hemorrhage, mass lesion, mass effect, midline shift or abnormal extra-axial fluid collection is identified.  There is no hydrocephalus or pneumocephalus.  The calvarium is intact.  IMPRESSION: No acute intracranial abnormality.  Chronic microvascular ischemic change.  CT MAXILLOFACIAL  Findings:  No facial bone fracture is identified.  The globes are intact.  The patient is status post bilateral lens extraction. There is near complete opacification of the left maxillary sinus with high attenuation material present most consistent with chronic sinusitis.  The paranasal sinuses are otherwise clear.  Orbital fat appears normal.  No foreign body is identified.  IMPRESSION:  1.  Negative for facial bone fracture. 2.  Chronic-appearing left maxillary sinus disease.  CT CERVICAL SPINE  Findings:   Again seen is postoperative change of C5-7 ACDF.  Since the prior study, the patient has undergone posterior fusion at these levels.  There is solid bridging bone across the disc interspaces and posterior elements.  Hardware is intact.  There is no fracture or subluxation of the cervical spine.  Lung apices are clear.  IMPRESSION:  1.  No acute abnormality. 2.  Status post C5-7 fusion.   Original Report Authenticated By: Holley Dexter, M.D.   Ct Cervical Spine Wo Contrast  10/31/2012   *RADIOLOGY REPORT*  Clinical Data:  Status post fall with a laceration in the nose. Weakness.  CT HEAD WITHOUT CONTRAST CT MAXILLOFACIAL WITHOUT CONTRAST CT CERVICAL SPINE WITHOUT CONTRAST  Technique:  Multidetector CT imaging of the head, cervical spine, and maxillofacial structures were performed using the standard protocol without intravenous contrast. Multiplanar CT image reconstructions of the  cervical spine and maxillofacial structures were also generated.  Comparison:  Cervical spine CT scan 08/15/2006.  CT HEAD  Findings: There is fairly extensive hypoattenuation in the subcortical and periventricular deep white matter compatible with chronic microvascular ischemic change.  No evidence of acute abnormality including infarction, hemorrhage, mass lesion, mass effect, midline shift or abnormal extra-axial fluid collection is identified.  There is no hydrocephalus or pneumocephalus.  The calvarium is intact.  IMPRESSION: No acute intracranial abnormality.  Chronic microvascular ischemic change.  CT MAXILLOFACIAL  Findings:  No facial bone fracture is identified.  The globes are intact.  The patient is status post bilateral lens extraction. There is near complete opacification of the left maxillary sinus with high attenuation material present most consistent with chronic sinusitis.  The paranasal sinuses are otherwise clear.  Orbital fat appears normal.  No foreign body is identified.  IMPRESSION:  1.  Negative for facial bone fracture. 2.  Chronic-appearing left maxillary sinus disease.  CT CERVICAL SPINE  Findings:   Again seen is postoperative change of C5-7 ACDF.  Since the prior study, the patient has undergone posterior fusion at these levels.  There is solid bridging bone across the disc interspaces and posterior elements.  Hardware is intact.  There is no fracture or subluxation of the cervical spine.  Lung apices are clear.  IMPRESSION:  1.  No acute abnormality. 2.  Status post C5-7 fusion.   Original Report Authenticated By: Holley Dexter, M.D.   Ct Maxillofacial Wo Cm  10/31/2012   *RADIOLOGY REPORT*  Clinical Data:  Status post fall with a laceration in the nose. Weakness.  CT HEAD WITHOUT CONTRAST CT MAXILLOFACIAL WITHOUT CONTRAST CT CERVICAL SPINE WITHOUT CONTRAST  Technique:  Multidetector CT imaging of the head, cervical spine, and maxillofacial structures were performed using the  standard protocol without intravenous contrast. Multiplanar CT image reconstructions of the cervical spine and maxillofacial structures were also generated.  Comparison:  Cervical spine CT scan 08/15/2006.  CT HEAD  Findings: There is fairly extensive hypoattenuation in the subcortical and  periventricular deep white matter compatible with chronic microvascular ischemic change.  No evidence of acute abnormality including infarction, hemorrhage, mass lesion, mass effect, midline shift or abnormal extra-axial fluid collection is identified.  There is no hydrocephalus or pneumocephalus.  The calvarium is intact.  IMPRESSION: No acute intracranial abnormality.  Chronic microvascular ischemic change.  CT MAXILLOFACIAL  Findings:  No facial bone fracture is identified.  The globes are intact.  The patient is status post bilateral lens extraction. There is near complete opacification of the left maxillary sinus with high attenuation material present most consistent with chronic sinusitis.  The paranasal sinuses are otherwise clear.  Orbital fat appears normal.  No foreign body is identified.  IMPRESSION:  1.  Negative for facial bone fracture. 2.  Chronic-appearing left maxillary sinus disease.  CT CERVICAL SPINE  Findings:   Again seen is postoperative change of C5-7 ACDF.  Since the prior study, the patient has undergone posterior fusion at these levels.  There is solid bridging bone across the disc interspaces and posterior elements.  Hardware is intact.  There is no fracture or subluxation of the cervical spine.  Lung apices are clear.  IMPRESSION:  1.  No acute abnormality. 2.  Status post C5-7 fusion.   Original Report Authenticated By: Holley Dexter, M.D.    CBC  Recent Labs Lab 10/31/12 1205 10/31/12 2024 11/01/12 0718  WBC 10.8* 11.5* 7.3  HGB 11.1* 10.9* 9.6*  HCT 34.0* 33.9* 29.5*  PLT 177 200 181  MCV 88.8 88.1 87.8  MCH 29.0 28.3 28.6  MCHC 32.6 32.2 32.5  RDW 13.7 13.8 13.7  LYMPHSABS 3.0   --   --   MONOABS 0.9  --   --   EOSABS 0.3  --   --   BASOSABS 0.0  --   --     Chemistries   Recent Labs Lab 10/31/12 1205 10/31/12 2024 11/01/12 0527  NA 138  --  141  K 5.9*  --  5.8*  CL 108  --  112  CO2 20  --  20  GLUCOSE 161*  --  107*  BUN 46*  --  39*  CREATININE 1.64* 1.42* 1.16*  CALCIUM 9.6  --  9.1  AST 15  --   --   ALT 12  --   --   ALKPHOS 56  --   --   BILITOT 0.1*  --   --    ------------------------------------------------------------------------------------------------------------------ estimated creatinine clearance is 43.4 ml/min (by C-G formula based on Cr of 1.16). ------------------------------------------------------------------------------------------------------------------  Recent Labs  10/31/12 2024  HGBA1C 7.5*   ------------------------------------------------------------------------------------------------------------------ No results found for this basename: CHOL, HDL, LDLCALC, TRIG, CHOLHDL, LDLDIRECT,  in the last 72 hours ------------------------------------------------------------------------------------------------------------------  Recent Labs  10/31/12 2024  TSH 1.163   ------------------------------------------------------------------------------------------------------------------  Recent Labs  10/31/12 2024  VITAMINB12 420    Coagulation profile No results found for this basename: INR, PROTIME,  in the last 168 hours  No results found for this basename: DDIMER,  in the last 72 hours  Cardiac Enzymes  Recent Labs Lab 10/31/12 1205  TROPONINI <0.30   ------------------------------------------------------------------------------------------------------------------ No components found with this basename: POCBNP,

## 2012-11-01 NOTE — Progress Notes (Signed)
Utilization Review Completed Elias Dennington J. Emori Mumme, RN, BSN, NCM [phone]  

## 2012-11-01 NOTE — Progress Notes (Signed)
Patient's BP is 172/68. She is asymptomatic at this time. No complaints and no signs or symptoms of distress or discomfort. MD notified and new orders given. Will continue to monitor patient for further changes in condition.

## 2012-11-02 LAB — GLUCOSE, CAPILLARY: Glucose-Capillary: 95 mg/dL (ref 70–99)

## 2012-11-02 MED ORDER — HYDRALAZINE HCL 25 MG PO TABS: 25.0000 mg | ORAL_TABLET | Freq: Three times a day (TID) | ORAL | Status: DC

## 2012-11-02 MED ORDER — SENNOSIDES-DOCUSATE SODIUM 8.6-50 MG PO TABS: 1.0000 | ORAL_TABLET | Freq: Every evening | ORAL | Status: DC | PRN

## 2012-11-02 NOTE — Care Management Note (Signed)
    Page 1 of 1   11/02/2012     2:19:13 PM   CARE MANAGEMENT NOTE 11/02/2012  Patient:  MIRZA, KIDNEY   Account Number:  [id number]  Date Initiated:  11/02/2012  Documentation initiated by:  Tera Mater  Subjective/Objective Assessment:   78yo female admitted with ARF, and S/P fall.  Pt. resides at [facility].     Action/Plan:   Discharge planning   Anticipated DC Date:  11/02/2012   Anticipated DC Plan:  ASSISTED LIVING / REST HOME  In-house referral  Clinical Social Worker      DC Planning Services  CM consult      Choice offered to / List presented to:             Status of service:  Completed, signed off Medicare Important Message given?   (If response is "NO", the following Medicare IM given date fields will be blank) Date Medicare IM given:   Date Additional Medicare IM given:    Discharge Disposition:  ASSISTED LIVING  Per UR Regulation:  Reviewed for med. necessity/level of care/duration of stay  If discussed at Long Length of Stay Meetings, dates discussed:    Comments:  11/02/12 1400 Noted CM referral for home health needs.  Pt. will be dc back to [facility] today, and the facility will provide [facility] RN and [facility] PT for pt.  Lupita Leash, CSW, aware of pt. and will assist with dc back to ALF today. Tera Mater, RN, BSN NCM [phone]

## 2012-11-02 NOTE — Discharge Summary (Signed)
[facility]                                                                                   [name], is a 78 y.o. female  DOB 1934-06-14  MRN [id number].  Admission date:  10/31/2012  Discharge Date:  11/02/2012  Primary MD  Lupita Raider, MD  Admitting Physician  Henderson Cloud, MD  Admission Diagnosis  Hyperkalemia [276.7] ARF (acute renal failure) [584.9] Renal insufficiency [593.9] Fall from chair, initial encounter [E884.2] Contusion, nose, initial encounter [920] Laceration of nose, initial encounter [873.20]  Discharge Diagnosis     Principal Problem:   Weakness generalized Active Problems:   HYPOTHYROIDISM   DM   HYPERTENSION   ARF (acute renal failure)   Hyperkalemia   CKD (chronic kidney disease) stage 3, GFR 30-59 ml/min   Fall   Nasal laceration    Past Medical History  Diagnosis Date  . Hypertension   . Macular degeneration, bilateral   . Type II diabetes mellitus   . COPD (chronic obstructive pulmonary disease)   . Diabetic peripheral neuropathy   . Hypercholesteremia   . PAD (peripheral artery disease)   . Chronic kidney disease     Past Surgical History  Procedure Laterality Date  . Tonsillectomy  1940's  . Abdominal hysterectomy  1970's  . Dilation and curettage of uterus  1970's    "probably 2" (11/01/2012)  . Cataract extraction w/ intraocular lens  implant, bilateral Bilateral ~ 2010  . Bypass graft Right ~ 1997    RLE by Dr. Sherian Maroon 11/29/2005 (11/01/2012)  . Cystostomy w/ bladder biopsy  2005    Hattie Perch 09/19/2003 (11/01/2012)  . Cardiac catheterization  11/29/2005    Hattie Perch 11/29/2005 (11/01/2012)  . Anterior cervical decomp/discectomy fusion  01/02/2006    Hattie Perch 01/02/2006 (11/01/2012)  . Shoulder open rotator cuff repair Bilateral 1990's-2000's    "3X on the left; twice on the right" (11/01/2012)     Recommendations for primary care physician for things to follow:       Discharge Diagnoses:    Principal Problem:   Weakness generalized Active Problems:   HYPOTHYROIDISM   DM   HYPERTENSION   ARF (acute renal failure)   Hyperkalemia   CKD (chronic kidney disease) stage 3, GFR 30-59 ml/min   Fall   Nasal laceration    Discharge Condition: Stable   Diet recommendation: See Discharge Instructions below   Consults     History of present illness and  Hospital Course:     Kindly see H&P for history of present illness and admission details, please review complete Labs, Consult reports and Test reports for all details in brief [name], is a 78 y.o. female, patient with history of chronic kidney disease stage II baseline creatinine around 1.02, chronic generalized weakness patient has been chair bound for several months, but is mellitus type II, hypothyroidism was admitted to the hospital after a mechanical fall at home from her wheelchair, she suffered some injury on the bridge of her nose which was a minor laceration, she was also found to be dehydrated and in acute renal failure, she was treated with supportive care which  included IV fluids, holding of her ARB, with these measures she is gradually come close to her baseline and feels much better and eager to go home. She denies any headache chest or abdominal pain. Her renal function has improved and is close to her baseline.   She also carries a history of hypothyroidism however her TSH was stable at home dose Synthroid will be continued.    For her diabetes mellitus type 2 she will continue her home regimen of insulin, her A1c was 7.5. Will request primary care physician to kindly continue to monitor her glycemic control and adjust medications as needed.   Hypertension. Due to her acute renal failure her ARB has been stopped for now, I have replaced it with hydralazine, future trial of ACE/ARB can be taken out patient based on her renal function.   For her generalized weakness she will continue to follow with her  PCP, I'm ordering home physical therapy for her upon discharge.   Will request primary care physician to Kindly monitor the small laceration site on the bridge of her nose next visit.     Today   Subjective:   [name] today has no headache,no chest abdominal pain,no new weakness tingling or numbness, feels much better wants to go home today.   Objective:   Blood pressure 144/52, pulse 51, temperature 97.8 F (36.6 C), temperature source Oral, resp. rate 17, height 5\' 3"  (1.6 m), weight 86.229 kg (190 lb 1.6 oz), SpO2 98.00%.   Intake/Output Summary (Last 24 hours) at 11/02/12 0934 Last data filed at 11/02/12 0600  Gross per 24 hour  Intake    843 ml  Output      0 ml  Net    843 ml    Exam Awake Alert, Oriented *3, No new F.N deficits, Normal affect [place].AT,PERRAL Supple Neck,No JVD, No cervical lymphadenopathy appriciated.  Symmetrical Chest wall movement, Good air movement bilaterally, CTAB RRR,No Gallops,Rubs or new Murmurs, No Parasternal Heave +ve B.Sounds, Abd Soft, Non tender, No organomegaly appriciated, No rebound -guarding or rigidity. No Cyanosis, Clubbing or edema, No new Rash or bruise  Data Review   Major procedures and Radiology Reports - PLEASE review detailed and final reports for all details in brief -       Dg Chest 2 View  10/31/2012   *RADIOLOGY REPORT*  Clinical Data: Weakness, dizziness, post fall  CHEST - 2 VIEW  Comparison: 01/23/2012; 06/08/2009; 02/01/2007; chest CT - 01/29/2007  Findings:  Grossly unchanged cardiac silhouette and mediastinal contours with atherosclerotic plaque within the thoracic aorta.  Evaluation of retrosternal clear space is obscured secondary to overlying soft tissues.  There is chronic pulmonary venous congestion without frank evidence of edema.  No focal airspace opacity.  No pleural effusion or pneumothorax.  Grossly unchanged bones including sequela of bilateral rotator cuff repair and lower cervical ACDF and  paraspinal fusion, incompletely evaluated.  IMPRESSION: Chronic pulmonary venous congestion without acute cardiopulmonary disease.   Original Report Authenticated By: Tacey Ruiz, MD   Ct Head Wo Contrast  10/31/2012   *RADIOLOGY REPORT*  Clinical Data:  Status post fall with a laceration in the nose. Weakness.  CT HEAD WITHOUT CONTRAST CT MAXILLOFACIAL WITHOUT CONTRAST CT CERVICAL SPINE WITHOUT CONTRAST  Technique:  Multidetector CT imaging of the head, cervical spine, and maxillofacial structures were performed using the standard protocol without intravenous contrast. Multiplanar CT image reconstructions of the cervical spine and maxillofacial structures were also generated.  Comparison:  Cervical spine CT  scan 08/15/2006.  CT HEAD  Findings: There is fairly extensive hypoattenuation in the subcortical and periventricular deep white matter compatible with chronic microvascular ischemic change.  No evidence of acute abnormality including infarction, hemorrhage, mass lesion, mass effect, midline shift or abnormal extra-axial fluid collection is identified.  There is no hydrocephalus or pneumocephalus.  The calvarium is intact.  IMPRESSION: No acute intracranial abnormality.  Chronic microvascular ischemic change.  CT MAXILLOFACIAL  Findings:  No facial bone fracture is identified.  The globes are intact.  The patient is status post bilateral lens extraction. There is near complete opacification of the left maxillary sinus with high attenuation material present most consistent with chronic sinusitis.  The paranasal sinuses are otherwise clear.  Orbital fat appears normal.  No foreign body is identified.  IMPRESSION:  1.  Negative for facial bone fracture. 2.  Chronic-appearing left maxillary sinus disease.  CT CERVICAL SPINE  Findings:   Again seen is postoperative change of C5-7 ACDF.  Since the prior study, the patient has undergone posterior fusion at these levels.  There is solid bridging bone across the disc  interspaces and posterior elements.  Hardware is intact.  There is no fracture or subluxation of the cervical spine.  Lung apices are clear.  IMPRESSION:  1.  No acute abnormality. 2.  Status post C5-7 fusion.   Original Report Authenticated By: Holley Dexter, M.D.   Ct Cervical Spine Wo Contrast  10/31/2012   *RADIOLOGY REPORT*  Clinical Data:  Status post fall with a laceration in the nose. Weakness.  CT HEAD WITHOUT CONTRAST CT MAXILLOFACIAL WITHOUT CONTRAST CT CERVICAL SPINE WITHOUT CONTRAST  Technique:  Multidetector CT imaging of the head, cervical spine, and maxillofacial structures were performed using the standard protocol without intravenous contrast. Multiplanar CT image reconstructions of the cervical spine and maxillofacial structures were also generated.  Comparison:  Cervical spine CT scan 08/15/2006.  CT HEAD  Findings: There is fairly extensive hypoattenuation in the subcortical and periventricular deep white matter compatible with chronic microvascular ischemic change.  No evidence of acute abnormality including infarction, hemorrhage, mass lesion, mass effect, midline shift or abnormal extra-axial fluid collection is identified.  There is no hydrocephalus or pneumocephalus.  The calvarium is intact.  IMPRESSION: No acute intracranial abnormality.  Chronic microvascular ischemic change.  CT MAXILLOFACIAL  Findings:  No facial bone fracture is identified.  The globes are intact.  The patient is status post bilateral lens extraction. There is near complete opacification of the left maxillary sinus with high attenuation material present most consistent with chronic sinusitis.  The paranasal sinuses are otherwise clear.  Orbital fat appears normal.  No foreign body is identified.  IMPRESSION:  1.  Negative for facial bone fracture. 2.  Chronic-appearing left maxillary sinus disease.  CT CERVICAL SPINE  Findings:   Again seen is postoperative change of C5-7 ACDF.  Since the prior study, the  patient has undergone posterior fusion at these levels.  There is solid bridging bone across the disc interspaces and posterior elements.  Hardware is intact.  There is no fracture or subluxation of the cervical spine.  Lung apices are clear.  IMPRESSION:  1.  No acute abnormality. 2.  Status post C5-7 fusion.   Original Report Authenticated By: Holley Dexter, M.D.   Ct Maxillofacial Wo Cm  10/31/2012   *RADIOLOGY REPORT*  Clinical Data:  Status post fall with a laceration in the nose. Weakness.  CT HEAD WITHOUT CONTRAST CT MAXILLOFACIAL WITHOUT CONTRAST CT CERVICAL SPINE  WITHOUT CONTRAST  Technique:  Multidetector CT imaging of the head, cervical spine, and maxillofacial structures were performed using the standard protocol without intravenous contrast. Multiplanar CT image reconstructions of the cervical spine and maxillofacial structures were also generated.  Comparison:  Cervical spine CT scan 08/15/2006.  CT HEAD  Findings: There is fairly extensive hypoattenuation in the subcortical and periventricular deep white matter compatible with chronic microvascular ischemic change.  No evidence of acute abnormality including infarction, hemorrhage, mass lesion, mass effect, midline shift or abnormal extra-axial fluid collection is identified.  There is no hydrocephalus or pneumocephalus.  The calvarium is intact.  IMPRESSION: No acute intracranial abnormality.  Chronic microvascular ischemic change.  CT MAXILLOFACIAL  Findings:  No facial bone fracture is identified.  The globes are intact.  The patient is status post bilateral lens extraction. There is near complete opacification of the left maxillary sinus with high attenuation material present most consistent with chronic sinusitis.  The paranasal sinuses are otherwise clear.  Orbital fat appears normal.  No foreign body is identified.  IMPRESSION:  1.  Negative for facial bone fracture. 2.  Chronic-appearing left maxillary sinus disease.  CT CERVICAL SPINE   Findings:   Again seen is postoperative change of C5-7 ACDF.  Since the prior study, the patient has undergone posterior fusion at these levels.  There is solid bridging bone across the disc interspaces and posterior elements.  Hardware is intact.  There is no fracture or subluxation of the cervical spine.  Lung apices are clear.  IMPRESSION:  1.  No acute abnormality. 2.  Status post C5-7 fusion.   Original Report Authenticated By: Holley Dexter, M.D.    Micro Results      No results found for this or any previous visit (from the past 240 hour(s)).   CBC w Diff: Lab Results  Component Value Date   WBC 7.3 11/01/2012   HGB 9.6* 11/01/2012   HCT 29.5* 11/01/2012   PLT 181 11/01/2012   LYMPHOPCT 28 10/31/2012   MONOPCT 8 10/31/2012   EOSPCT 3 10/31/2012   BASOPCT 0 10/31/2012    CMP: Lab Results  Component Value Date   NA 141 11/01/2012   K 4.4 11/01/2012   CL 112 11/01/2012   CO2 20 11/01/2012   BUN 39* 11/01/2012   CREATININE 1.16* 11/01/2012   PROT 6.5 10/31/2012   ALBUMIN 3.2* 10/31/2012   BILITOT 0.1* 10/31/2012   ALKPHOS 56 10/31/2012   AST 15 10/31/2012   ALT 12 10/31/2012  .   Discharge Instructions     Follow with Primary MD Lupita Raider, MD in 4 days   Get CBC, CMP, checked 4 days by Primary MD and again as instructed by your Primary MD.   Get Medicines reviewed and adjusted.  Please request your Prim.MD to go over all Hospital Tests and Procedure/Radiological results at the follow up, please get all Hospital records sent to your Prim MD by signing hospital release before you go home.  Activity: As tolerated with Full fall precautions use walker/cane & assistance as needed   Diet:  Heart Healthy - Low carb  For Heart failure patients - Check your Weight same time everyday, if you gain over 2 pounds, or you develop in leg swelling, experience more shortness of breath or chest pain, call your Primary MD immediately. Follow Cardiac Low Salt Diet and 1.8 lit/day fluid  restriction.  Disposition Home    If you experience worsening of your admission symptoms, develop shortness of breath, life  threatening emergency, suicidal or homicidal thoughts you must seek medical attention immediately by calling 911 or calling your MD immediately  if symptoms less severe.  You Must read complete instructions/literature along with all the possible adverse reactions/side effects for all the Medicines you take and that have been prescribed to you. Take any new Medicines after you have completely understood and accpet all the possible adverse reactions/side effects.   Do not drive and provide baby sitting services if your were admitted for syncope or siezures until you have seen by Primary MD or a Neurologist and advised to do so again.  Do not drive when taking Pain medications.    Do not take more than prescribed Pain, Sleep and Anxiety Medications  Special Instructions: If you have smoked or chewed Tobacco  in the last 2 yrs please stop smoking, stop any regular Alcohol  and or any Recreational drug use.  Wear Seat belts while driving.   Please note  You were cared for by a hospitalist during your hospital stay. If you have any questions about your discharge medications or the care you received while you were in the hospital after you are discharged, you can call the unit and asked to speak with the hospitalist on call if the hospitalist that took care of you is not available. Once you are discharged, your primary care physician will handle any further medical issues. Please note that NO REFILLS for any discharge medications will be authorized once you are discharged, as it is imperative that you return to your primary care physician (or establish a relationship with a primary care physician if you do not have one) for your aftercare needs so that they can reassess your need for medications and monitor your lab values.    Follow-up Information   Follow up with  SHAW,KIMBERLEE, MD. Schedule an appointment as soon as possible for a visit in 3 days.   Contact information:   [address] [place] [address] [phone]         Discharge Medications     Medication List    STOP taking these medications       losartan 50 MG tablet  Commonly known as:  COZAAR      TAKE these medications       albuterol (5 MG/ML) 0.5% nebulizer solution  Commonly known as:  PROVENTIL  Take 0.5 mLs (2.5 mg total) by nebulization every 2 (two) hours as needed for wheezing or shortness of breath.     amitriptyline 25 MG tablet  Commonly known as:  ELAVIL  Take 25 mg by mouth at bedtime.     amLODipine 10 MG tablet  Commonly known as:  NORVASC  Take 10 mg by mouth daily.     aspirin 81 MG chewable tablet  Chew 81 mg by mouth daily.     carvedilol 25 MG tablet  Commonly known as:  COREG  Take 25 mg by mouth 2 (two) times daily with a meal.     cloNIDine 0.2 MG tablet  Commonly known as:  CATAPRES  Take 1 tablet (0.2 mg total) by mouth 2 (two) times daily.     Fluticasone-Salmeterol 100-50 MCG/DOSE Aepb  Commonly known as:  ADVAIR  Inhale 1 puff into the lungs every 12 (twelve) hours.     furosemide 20 MG tablet  Commonly known as:  LASIX  Take 20 mg by mouth daily.     gabapentin 100 MG capsule  Commonly known as:  NEURONTIN  Take 100 mg by mouth 2 (two) times daily.     gabapentin 100 MG capsule  Commonly known as:  NEURONTIN  Take 300 mg by mouth at bedtime.     HEALTHY EYES Tabs  Take 1 tablet by mouth daily.     hydrALAZINE 25 MG tablet  Commonly known as:  APRESOLINE  Take 1 tablet (25 mg total) by mouth every 8 (eight) hours.     HYDROcodone-acetaminophen 5-325 MG per tablet  Commonly known as:  NORCO/VICODIN  Take 1 tablet by mouth every 6 (six) hours as needed for pain.     insulin glargine 100 UNIT/ML injection  Commonly known as:  LANTUS  Inject 49 Units into the skin at bedtime.     insulin lispro  100 UNIT/ML injection  Commonly known as:  HUMALOG  Inject 10 Units into the skin daily. Hold if fsbs below 250     ipratropium-albuterol 0.5-2.5 (3) MG/3ML Soln  Commonly known as:  DUONEB  Take 3 mLs by nebulization every 6 (six) hours as needed.     levothyroxine 50 MCG tablet  Commonly known as:  SYNTHROID, LEVOTHROID  Take 50 mcg by mouth daily.     potassium chloride SA 20 MEQ tablet  Commonly known as:  K-DUR,KLOR-CON  Take 20 mEq by mouth daily.     rosuvastatin 20 MG tablet  Commonly known as:  CRESTOR  Take 20 mg by mouth daily.     senna-docusate 8.6-50 MG per tablet  Commonly known as:  Senokot-S  Take 1 tablet by mouth at bedtime as needed.         Total Time in preparing paper work, data evaluation and todays exam - 35 minutes  Leroy Sea M.D on 11/02/2012 at 9:34 AM  [facility] Office  [phone]

## 2012-11-02 NOTE — Progress Notes (Signed)
Chaplain visited pt in response to a spiritual care consult request. Pt was awake , alert and responsive. Pt was recovering from a fall. She is very optimistic and draws strong support from family. Chaplain listened empathically to her story and prayed with her at bedside. Pt expressed her appreciation for chaplain's visit and prayer. Kelle Darting [phone]  11/02/12 1152  Clinical Encounter Type  Visited With Patient  Visit Type Initial;Spiritual support  Referral From Patient;Nurse  Consult/Referral To Chaplain  Spiritual Encounters  Spiritual Needs Prayer;Emotional  Stress Factors  Patient Stress Factors Exhausted;Other (Comment) (sore from fall)

## 2012-11-02 NOTE — Progress Notes (Signed)
DC IV, DC Tele, DC Home. Discharge instructions and home medications dicussed with patient and patient's granddaughter. Patient and family member denied any questions or concerns at this time. Patient leaving unit via wheelchair and appears in no acute distress.

## 2013-03-02 ENCOUNTER — Emergency Department (HOSPITAL_COMMUNITY)
Admission: EM | Admit: 2013-03-02 | Discharge: 2013-03-02 | Disposition: A | Discharge status: Skilled Nursing Facility | Payer: Medicare Other | Attending: Emergency Medicine | Admitting: Emergency Medicine

## 2013-03-02 ENCOUNTER — Encounter (HOSPITAL_COMMUNITY): Payer: Self-pay | Admitting: Emergency Medicine

## 2013-03-02 DIAGNOSIS — Z9849 Cataract extraction status, unspecified eye: Secondary | ICD-10-CM | POA: Insufficient documentation

## 2013-03-02 DIAGNOSIS — G909 Disorder of the autonomic nervous system, unspecified: Secondary | ICD-10-CM | POA: Insufficient documentation

## 2013-03-02 DIAGNOSIS — Z7982 Long term (current) use of aspirin: Secondary | ICD-10-CM | POA: Insufficient documentation

## 2013-03-02 DIAGNOSIS — Z880 Allergy status to penicillin: Secondary | ICD-10-CM | POA: Insufficient documentation

## 2013-03-02 DIAGNOSIS — N189 Chronic kidney disease, unspecified: Secondary | ICD-10-CM | POA: Insufficient documentation

## 2013-03-02 DIAGNOSIS — J4489 Other specified chronic obstructive pulmonary disease: Secondary | ICD-10-CM | POA: Insufficient documentation

## 2013-03-02 DIAGNOSIS — I10 Essential (primary) hypertension: Secondary | ICD-10-CM | POA: Insufficient documentation

## 2013-03-02 DIAGNOSIS — L97409 Non-pressure chronic ulcer of unspecified heel and midfoot with unspecified severity: Secondary | ICD-10-CM | POA: Insufficient documentation

## 2013-03-02 DIAGNOSIS — Z951 Presence of aortocoronary bypass graft: Secondary | ICD-10-CM | POA: Insufficient documentation

## 2013-03-02 DIAGNOSIS — E11621 Type 2 diabetes mellitus with foot ulcer: Secondary | ICD-10-CM

## 2013-03-02 DIAGNOSIS — F17200 Nicotine dependence, unspecified, uncomplicated: Secondary | ICD-10-CM | POA: Insufficient documentation

## 2013-03-02 DIAGNOSIS — E1149 Type 2 diabetes mellitus with other diabetic neurological complication: Secondary | ICD-10-CM | POA: Insufficient documentation

## 2013-03-02 DIAGNOSIS — Z961 Presence of intraocular lens: Secondary | ICD-10-CM | POA: Insufficient documentation

## 2013-03-02 DIAGNOSIS — J449 Chronic obstructive pulmonary disease, unspecified: Secondary | ICD-10-CM | POA: Insufficient documentation

## 2013-03-02 DIAGNOSIS — Z9861 Coronary angioplasty status: Secondary | ICD-10-CM | POA: Insufficient documentation

## 2013-03-02 DIAGNOSIS — Z8669 Personal history of other diseases of the nervous system and sense organs: Secondary | ICD-10-CM | POA: Insufficient documentation

## 2013-03-02 DIAGNOSIS — E669 Obesity, unspecified: Secondary | ICD-10-CM | POA: Insufficient documentation

## 2013-03-02 DIAGNOSIS — Z79899 Other long term (current) drug therapy: Secondary | ICD-10-CM | POA: Insufficient documentation

## 2013-03-02 DIAGNOSIS — Z794 Long term (current) use of insulin: Secondary | ICD-10-CM | POA: Insufficient documentation

## 2013-03-02 DIAGNOSIS — E1169 Type 2 diabetes mellitus with other specified complication: Secondary | ICD-10-CM | POA: Insufficient documentation

## 2013-03-02 DIAGNOSIS — E7800 Pure hypercholesterolemia, unspecified: Secondary | ICD-10-CM | POA: Insufficient documentation

## 2013-03-02 LAB — POCT I-STAT, CHEM 8
BUN: 32 mg/dL — ABNORMAL HIGH (ref 6–23)
Calcium, Ion: 1.27 mmol/L (ref 1.13–1.30)
Chloride: 109 mEq/L (ref 96–112)
Creatinine, Ser: 1.8 mg/dL — ABNORMAL HIGH (ref 0.50–1.10)
Glucose, Bld: 164 mg/dL — ABNORMAL HIGH (ref 70–99)
TCO2: 24 mmol/L (ref 0–100)

## 2013-03-02 LAB — GLUCOSE, CAPILLARY: Glucose-Capillary: 146 mg/dL — ABNORMAL HIGH (ref 70–99)

## 2013-03-02 MED ORDER — BACITRACIN ZINC 500 UNIT/GM EX OINT
TOPICAL_OINTMENT | Freq: Two times a day (BID) | CUTANEOUS | Status: DC
  Filled 2013-03-02: qty 28.35

## 2013-03-02 MED ORDER — CIPROFLOXACIN HCL 500 MG PO TABS
500.0000 mg | ORAL_TABLET | Freq: Once | ORAL | Status: AC
  Administered 2013-03-02: 500 mg via ORAL
  Filled 2013-03-02: qty 1

## 2013-03-02 MED ORDER — BACITRACIN 500 UNIT/GM EX OINT
1.0000 "application " | TOPICAL_OINTMENT | Freq: Two times a day (BID) | CUTANEOUS | Status: DC
  Filled 2013-03-02 (×2): qty 0.9

## 2013-03-02 MED ORDER — CIPROFLOXACIN HCL 500 MG PO TABS: 500.0000 mg | ORAL_TABLET | Freq: Two times a day (BID) | ORAL | Status: DC

## 2013-03-02 NOTE — ED Notes (Signed)
Pt from [facility], c/o left foot ulcer. Pt states ulcer has been present for 3 months, drainage began 2 days ago, increase in pain with ambulation and deep palpation. Sent for eval of wound

## 2013-03-02 NOTE — ED Provider Notes (Addendum)
CSN: [id number]     Arrival date & time 03/02/13  1105 History   First MD Initiated Contact with Patient 03/02/13 1122     Chief Complaint  Patient presents with  . Foot Ulcer   (Consider location/radiation/quality/duration/timing/severity/associated sxs/prior Treatment) HPI Complains of painful ulcer to left heel onset 3 months ago. Pain increases with palpation. No fever no nausea no vomiting no other associated symptoms. No treatment prior to coming here. No other complaint. Pain is mild at present. Past Medical History  Diagnosis Date  . Hypertension   . Macular degeneration, bilateral   . Type II diabetes mellitus   . COPD (chronic obstructive pulmonary disease)   . Diabetic peripheral neuropathy   . Hypercholesteremia   . PAD (peripheral artery disease)   . Chronic kidney disease    Past Surgical History  Procedure Laterality Date  . Tonsillectomy  1940's  . Abdominal hysterectomy  1970's  . Dilation and curettage of uterus  1970's    "probably 2" (11/01/2012)  . Cataract extraction w/ intraocular lens  implant, bilateral Bilateral ~ 2010  . Bypass graft Right ~ 1997    RLE by Dr. Sherian Maroon 11/29/2005 (11/01/2012)  . Cystostomy w/ bladder biopsy  2005    Hattie Perch 09/19/2003 (11/01/2012)  . Cardiac catheterization  11/29/2005    Hattie Perch 11/29/2005 (11/01/2012)  . Anterior cervical decomp/discectomy fusion  01/02/2006    Hattie Perch 01/02/2006 (11/01/2012)  . Shoulder open rotator cuff repair Bilateral 1990's-2000's    "3X on the left; twice on the right" (11/01/2012)   History reviewed. No pertinent family history. History  Substance Use Topics  . Smoking status: Current Every Day Smoker -- 0.12 packs/day for 60 years    Types: Cigarettes  . Smokeless tobacco: Never Used  . Alcohol Use: No   OB History   Grav Para Term Preterm Abortions TAB SAB Ect Mult Living                 Review of Systems  Constitutional: Negative.   Skin: Positive for wound.    Allergies   Penicillins and Sulfur  Home Medications   Current Outpatient Rx  Name  Route  Sig  Dispense  Refill  . EXPIRED: albuterol (PROVENTIL) (5 MG/ML) 0.5% nebulizer solution   Nebulization   Take 0.5 mLs (2.5 mg total) by nebulization every 2 (two) hours as needed for wheezing or shortness of breath.   20 mL   0   . amitriptyline (ELAVIL) 25 MG tablet   Oral   Take 25 mg by mouth at bedtime.         Marland Kitchen amLODipine (NORVASC) 10 MG tablet   Oral   Take 10 mg by mouth daily.         Marland Kitchen aspirin 81 MG chewable tablet   Oral   Chew 81 mg by mouth daily.         . carvedilol (COREG) 25 MG tablet   Oral   Take 25 mg by mouth 2 (two) times daily with a meal.         . EXPIRED: cloNIDine (CATAPRES) 0.2 MG tablet   Oral   Take 1 tablet (0.2 mg total) by mouth 2 (two) times daily.   60 tablet   3   . Fluticasone-Salmeterol (ADVAIR) 100-50 MCG/DOSE AEPB   Inhalation   Inhale 1 puff into the lungs every 12 (twelve) hours.         . furosemide (LASIX) 20 MG tablet   Oral  Take 20 mg by mouth daily.         Marland Kitchen gabapentin (NEURONTIN) 100 MG capsule   Oral   Take 100 mg by mouth 2 (two) times daily.         Marland Kitchen gabapentin (NEURONTIN) 100 MG capsule   Oral   Take 300 mg by mouth at bedtime.         . hydrALAZINE (APRESOLINE) 25 MG tablet   Oral   Take 1 tablet (25 mg total) by mouth every 8 (eight) hours.   90 tablet   0   . HYDROcodone-acetaminophen (NORCO/VICODIN) 5-325 MG per tablet   Oral   Take 1 tablet by mouth every 6 (six) hours as needed for pain.         Marland Kitchen insulin glargine (LANTUS) 100 UNIT/ML injection   Subcutaneous   Inject 49 Units into the skin at bedtime.         . insulin lispro (HUMALOG) 100 UNIT/ML injection   Subcutaneous   Inject 10 Units into the skin daily. Hold if fsbs below 250         . ipratropium-albuterol (DUONEB) 0.5-2.5 (3) MG/3ML SOLN   Nebulization   Take 3 mLs by nebulization every 6 (six) hours as needed.   360 mL    0   . levothyroxine (SYNTHROID, LEVOTHROID) 50 MCG tablet   Oral   Take 50 mcg by mouth daily.         . Multiple Vitamins-Minerals (HEALTHY EYES) TABS   Oral   Take 1 tablet by mouth daily.         . potassium chloride SA (K-DUR,KLOR-CON) 20 MEQ tablet   Oral   Take 20 mEq by mouth daily.         . rosuvastatin (CRESTOR) 20 MG tablet   Oral   Take 20 mg by mouth daily.         Marland Kitchen senna-docusate (SENOKOT-S) 8.6-50 MG per tablet   Oral   Take 1 tablet by mouth at bedtime as needed.   30 tablet   0    BP 142/52  Pulse 54  Temp(Src) 98.1 F (36.7 C) (Oral)  Resp 14  SpO2 98% Physical Exam  Nursing note and vitals reviewed. Constitutional: She appears well-developed and well-nourished. No distress.  HENT:  Head: Normocephalic and atraumatic.  Eyes: EOM are normal.  Neck: Normal range of motion.  Cardiovascular:  Mildly bradycardic  Pulmonary/Chest: Effort normal.  Abdominal: She exhibits no distension.  Obese  Musculoskeletal: Normal range of motion. She exhibits no edema.  Neurological: She is alert. No cranial nerve deficit. Coordination normal.  Skin:  Left lower There is a 0.5 cm circular wound at the lateral aspect of the heel which is opened. There is a slight amount of green drainage on the bandage. No redness, wound is minimally tender. DP pulses and PT pulses are absent in both feet however both feet are normal temperature,    ED Course  Procedures (including critical care time) Labs Review Labs Reviewed  GLUCOSE, CAPILLARY - Abnormal; Notable for the following:    Glucose-Capillary 146 (*)    All other components within normal limits  POCT I-STAT, CHEM 8 - Abnormal; Notable for the following:    BUN 32 (*)    Creatinine, Ser 1.80 (*)    Glucose, Bld 164 (*)    Hemoglobin 10.9 (*)    HCT 32.0 (*)    All other components within normal limits   Imaging Review  No results found.  EKG Interpretation   None      Results for orders placed  during the hospital encounter of 03/02/13  GLUCOSE, CAPILLARY      Result Value Range   Glucose-Capillary 146 (*) 70 - 99 mg/dL  POCT I-STAT, CHEM 8      Result Value Range   Sodium 141  135 - 145 mEq/L   Potassium 5.1  3.5 - 5.1 mEq/L   Chloride 109  96 - 112 mEq/L   BUN 32 (*) 6 - 23 mg/dL   Creatinine, Ser 1.61 (*) 0.50 - 1.10 mg/dL   Glucose, Bld 096 (*) 70 - 99 mg/dL   Calcium, Ion 0.45  4.09 - 1.30 mmol/L   TCO2 24  0 - 100 mmol/L   Hemoglobin 10.9 (*) 12.0 - 15.0 g/dL   HCT 81.1 (*) 91.4 - 78.2 %   No results found.   MDM  No diagnosis found. I suspect vascular insufficiency is chronic. Patient would benefit from wound care clinic. Plan prescription Cipro Diagnosis #1 diabetic foot ulcer #2 hyperglycemia #3 renal insufficiency    Doug Sou, MD 03/02/13 1225  Doug Sou, MD 03/02/13 9562  Doug Sou, MD 03/02/13 1233

## 2013-03-18 ENCOUNTER — Ambulatory Visit (HOSPITAL_COMMUNITY)
Admission: RE | Admit: 2013-03-18 | Discharge: 2013-03-18 | Disposition: A | Discharge status: Home / Self Care | Payer: Medicare Other | Source: Ambulatory Visit | Attending: General Surgery | Admitting: General Surgery

## 2013-03-18 ENCOUNTER — Encounter (HOSPITAL_BASED_OUTPATIENT_CLINIC_OR_DEPARTMENT_OTHER): Payer: Medicare Other | Attending: General Surgery

## 2013-03-18 ENCOUNTER — Other Ambulatory Visit (HOSPITAL_COMMUNITY): Payer: Self-pay | Admitting: General Surgery

## 2013-03-18 DIAGNOSIS — J449 Chronic obstructive pulmonary disease, unspecified: Secondary | ICD-10-CM | POA: Insufficient documentation

## 2013-03-18 DIAGNOSIS — L97909 Non-pressure chronic ulcer of unspecified part of unspecified lower leg with unspecified severity: Secondary | ICD-10-CM | POA: Insufficient documentation

## 2013-03-18 DIAGNOSIS — M79609 Pain in unspecified limb: Secondary | ICD-10-CM | POA: Insufficient documentation

## 2013-03-18 DIAGNOSIS — E039 Hypothyroidism, unspecified: Secondary | ICD-10-CM | POA: Insufficient documentation

## 2013-03-18 DIAGNOSIS — M899 Disorder of bone, unspecified: Secondary | ICD-10-CM | POA: Insufficient documentation

## 2013-03-18 DIAGNOSIS — I87319 Chronic venous hypertension (idiopathic) with ulcer of unspecified lower extremity: Secondary | ICD-10-CM | POA: Insufficient documentation

## 2013-03-18 DIAGNOSIS — L97409 Non-pressure chronic ulcer of unspecified heel and midfoot with unspecified severity: Secondary | ICD-10-CM | POA: Insufficient documentation

## 2013-03-18 DIAGNOSIS — Z794 Long term (current) use of insulin: Secondary | ICD-10-CM | POA: Insufficient documentation

## 2013-03-18 DIAGNOSIS — E1169 Type 2 diabetes mellitus with other specified complication: Secondary | ICD-10-CM | POA: Insufficient documentation

## 2013-03-18 DIAGNOSIS — M869 Osteomyelitis, unspecified: Secondary | ICD-10-CM

## 2013-03-18 DIAGNOSIS — F17200 Nicotine dependence, unspecified, uncomplicated: Secondary | ICD-10-CM | POA: Insufficient documentation

## 2013-03-18 DIAGNOSIS — L97309 Non-pressure chronic ulcer of unspecified ankle with unspecified severity: Secondary | ICD-10-CM | POA: Insufficient documentation

## 2013-03-18 DIAGNOSIS — I10 Essential (primary) hypertension: Secondary | ICD-10-CM | POA: Insufficient documentation

## 2013-03-18 DIAGNOSIS — E119 Type 2 diabetes mellitus without complications: Secondary | ICD-10-CM | POA: Insufficient documentation

## 2013-03-18 DIAGNOSIS — Z7982 Long term (current) use of aspirin: Secondary | ICD-10-CM | POA: Insufficient documentation

## 2013-03-18 DIAGNOSIS — Z79899 Other long term (current) drug therapy: Secondary | ICD-10-CM | POA: Insufficient documentation

## 2013-03-18 DIAGNOSIS — J4489 Other specified chronic obstructive pulmonary disease: Secondary | ICD-10-CM | POA: Insufficient documentation

## 2013-03-19 NOTE — Progress Notes (Signed)
[facility]  NAME:  [name], [name]                ACCOUNT NO.:  [id number]  MEDICAL RECORD NO.:  [id number]      DATE OF BIRTH:  [birth year]  PHYSICIAN:  Ardath Sax, M.D.           VISIT DATE:                                  OFFICE VISIT   This is a 78 year old female who comes to the [facility] because of a small diabetic foot ulcer on the lateral aspect of her left ankle.  This has been present for several months and has been treated by her doctor with Silvadene ointment without much success.  The ulcer is only about 4 mm in diameter, and it is not very deep whatsoever.  I thought today we would just debride it with a curette, and then treat it with Santyl and dressings, and see how it goes.  I do not think it is bad enough to think about hyperbaric oxygen or any of the biologic dressings.  I would call this a Wagner 2 diabetic foot ulcer on her left ankle.  Other diagnoses are hypertension, chronic obstructive pulmonary disease, she is a heavy smoker, she has some edema of her legs, so there is some venous hypertension also and she also has hypothyroidism.  Her medications include losartan.  She presently is on Cipro for this ulcer.  She is also on hydrocodone for pain.  She takes albuterol.  She is also on Xanax.  She is on polyethylene glycol powder, aspirin, gabapentin, Lasix, levothyroxine, amlodipine, clonidine, Coreg, Lantus insulin, and Humalog insulin.  She was examined today and had a blood pressure of 146/75, respirations 17, pulse 54, temperature 97.5.  She weighs 185 pounds.  She will come back in a week and we will treat her with Santyl.     Ardath Sax, M.D.     PP/MEDQ  D:  03/18/2013  T:  03/19/2013  Job:  [id number]

## 2013-04-07 ENCOUNTER — Encounter (HOSPITAL_COMMUNITY): Payer: Self-pay | Admitting: Emergency Medicine

## 2013-04-07 ENCOUNTER — Emergency Department (HOSPITAL_COMMUNITY)
Admission: EM | Admit: 2013-04-07 | Discharge: 2013-04-07 | Disposition: A | Discharge status: Home / Self Care | Payer: Medicare Other | Attending: Emergency Medicine | Admitting: Emergency Medicine

## 2013-04-07 DIAGNOSIS — Z880 Allergy status to penicillin: Secondary | ICD-10-CM | POA: Insufficient documentation

## 2013-04-07 DIAGNOSIS — W19XXXA Unspecified fall, initial encounter: Secondary | ICD-10-CM

## 2013-04-07 DIAGNOSIS — Z7982 Long term (current) use of aspirin: Secondary | ICD-10-CM | POA: Insufficient documentation

## 2013-04-07 DIAGNOSIS — J449 Chronic obstructive pulmonary disease, unspecified: Secondary | ICD-10-CM | POA: Insufficient documentation

## 2013-04-07 DIAGNOSIS — I129 Hypertensive chronic kidney disease with stage 1 through stage 4 chronic kidney disease, or unspecified chronic kidney disease: Secondary | ICD-10-CM | POA: Insufficient documentation

## 2013-04-07 DIAGNOSIS — E1142 Type 2 diabetes mellitus with diabetic polyneuropathy: Secondary | ICD-10-CM | POA: Insufficient documentation

## 2013-04-07 DIAGNOSIS — S0100XA Unspecified open wound of scalp, initial encounter: Secondary | ICD-10-CM | POA: Insufficient documentation

## 2013-04-07 DIAGNOSIS — IMO0002 Reserved for concepts with insufficient information to code with codable children: Secondary | ICD-10-CM | POA: Insufficient documentation

## 2013-04-07 DIAGNOSIS — Z79899 Other long term (current) drug therapy: Secondary | ICD-10-CM | POA: Insufficient documentation

## 2013-04-07 DIAGNOSIS — Y9389 Activity, other specified: Secondary | ICD-10-CM | POA: Insufficient documentation

## 2013-04-07 DIAGNOSIS — S0101XA Laceration without foreign body of scalp, initial encounter: Secondary | ICD-10-CM

## 2013-04-07 DIAGNOSIS — Z794 Long term (current) use of insulin: Secondary | ICD-10-CM | POA: Insufficient documentation

## 2013-04-07 DIAGNOSIS — Y9210 Unspecified residential institution as the place of occurrence of the external cause: Secondary | ICD-10-CM | POA: Insufficient documentation

## 2013-04-07 DIAGNOSIS — J4489 Other specified chronic obstructive pulmonary disease: Secondary | ICD-10-CM | POA: Insufficient documentation

## 2013-04-07 DIAGNOSIS — E7800 Pure hypercholesterolemia, unspecified: Secondary | ICD-10-CM | POA: Insufficient documentation

## 2013-04-07 DIAGNOSIS — W1809XA Striking against other object with subsequent fall, initial encounter: Secondary | ICD-10-CM | POA: Insufficient documentation

## 2013-04-07 DIAGNOSIS — E1149 Type 2 diabetes mellitus with other diabetic neurological complication: Secondary | ICD-10-CM | POA: Insufficient documentation

## 2013-04-07 DIAGNOSIS — N189 Chronic kidney disease, unspecified: Secondary | ICD-10-CM | POA: Insufficient documentation

## 2013-04-07 DIAGNOSIS — R739 Hyperglycemia, unspecified: Secondary | ICD-10-CM

## 2013-04-07 DIAGNOSIS — I739 Peripheral vascular disease, unspecified: Secondary | ICD-10-CM | POA: Insufficient documentation

## 2013-04-07 DIAGNOSIS — F17200 Nicotine dependence, unspecified, uncomplicated: Secondary | ICD-10-CM | POA: Insufficient documentation

## 2013-04-07 MED ORDER — LIDOCAINE-EPINEPHRINE-TETRACAINE (LET) SOLUTION
3.0000 mL | Freq: Once | NASAL | Status: AC
  Administered 2013-04-07: 3 mL via TOPICAL
  Filled 2013-04-07: qty 3

## 2013-04-07 MED ORDER — INSULIN ASPART 100 UNIT/ML ~~LOC~~ SOLN
5.0000 [IU] | Freq: Once | SUBCUTANEOUS | Status: AC
  Administered 2013-04-07: 5 [IU] via SUBCUTANEOUS
  Filled 2013-04-07: qty 1

## 2013-04-07 NOTE — ED Notes (Signed)
Fell at [facility] has a laceration on the right back of head. CBG 442. BP 154/84, HR 74, No LOC, neck pain, dizziness, ROM good and grips equal and strong. Oriented x4 and alert. No N/V. Right arm pain and left heel unrelated.

## 2013-04-07 NOTE — ED Provider Notes (Signed)
CSN: [id number]     Arrival date & time 04/07/13  1453 History   First MD Initiated Contact with Patient 04/07/13 1503     Chief Complaint  Patient presents with  . Fall  . Head Laceration  . Hyperglycemia   (Consider location/radiation/quality/duration/timing/severity/associated sxs/prior Treatment) HPI  Pt states she was bending over to put on her leg brace and lost her balance and fell backwards, hitting her head on a night stand. No LOC, nausea, vomiting, blurred vision, neck pain, head pain or any other injury.  States she didn't want to come to the ED.  EMS reports CBG of 442.   Tetanus UTD  PCP Dr Pincus Badder  Past Medical History  Diagnosis Date  . Hypertension   . Macular degeneration, bilateral   . Type II diabetes mellitus   . COPD (chronic obstructive pulmonary disease)   . Diabetic peripheral neuropathy   . Hypercholesteremia   . PAD (peripheral artery disease)   . Chronic kidney disease    Past Surgical History  Procedure Laterality Date  . Tonsillectomy  1940's  . Abdominal hysterectomy  1970's  . Dilation and curettage of uterus  1970's    "probably 2" (11/01/2012)  . Cataract extraction w/ intraocular lens  implant, bilateral Bilateral ~ 2010  . Bypass graft Right ~ 1997    RLE by Dr. Sherian Maroon 11/29/2005 (11/01/2012)  . Cystostomy w/ bladder biopsy  2005    Hattie Perch 09/19/2003 (11/01/2012)  . Cardiac catheterization  11/29/2005    Hattie Perch 11/29/2005 (11/01/2012)  . Anterior cervical decomp/discectomy fusion  01/02/2006    Hattie Perch 01/02/2006 (11/01/2012)  . Shoulder open rotator cuff repair Bilateral 1990's-2000's    "3X on the left; twice on the right" (11/01/2012)   No family history on file. History  Substance Use Topics  . Smoking status: Current Every Day Smoker -- 0.12 packs/day for 60 years    Types: Cigarettes  . Smokeless tobacco: Never Used  . Alcohol Use: No   Lives in ALF  OB History   Grav Para Term Preterm Abortions TAB SAB Ect Mult Living            Review of Systems  All other systems reviewed and are negative.    Allergies  Codeine; Lisinopril; Penicillins; and Sulfur  Home Medications   Current Outpatient Rx  Name  Route  Sig  Dispense  Refill  . albuterol (PROVENTIL) (2.5 MG/3ML) 0.083% nebulizer solution   Nebulization   Take 2.5 mg by nebulization every 6 (six) hours as needed for wheezing.         Marland Kitchen ALPRAZolam (XANAX) 0.25 MG tablet   Oral   Take 0.25 mg by mouth 2 (two) times daily as needed for anxiety.         Marland Kitchen amitriptyline (ELAVIL) 25 MG tablet   Oral   Take 50 mg by mouth at bedtime.          Marland Kitchen amLODipine (NORVASC) 10 MG tablet   Oral   Take 10 mg by mouth daily.         Marland Kitchen aspirin 81 MG chewable tablet   Oral   Chew 81 mg by mouth daily.         . carvedilol (COREG) 25 MG tablet   Oral   Take 25 mg by mouth 2 (two) times daily with a meal.         . ciprofloxacin (CIPRO) 500 MG tablet   Oral   Take 1 tablet (500 mg  total) by mouth 2 (two) times daily.   20 tablet   0   . cloNIDine (CATAPRES) 0.2 MG tablet   Oral   Take 0.2 mg by mouth 2 (two) times daily.         . Fluticasone-Salmeterol (ADVAIR) 100-50 MCG/DOSE AEPB   Inhalation   Inhale 1 puff into the lungs every 12 (twelve) hours.         . furosemide (LASIX) 20 MG tablet   Oral   Take 20 mg by mouth daily.         Marland Kitchen gabapentin (NEURONTIN) 100 MG capsule   Oral   Take 100 mg by mouth 2 (two) times daily.         Marland Kitchen gabapentin (NEURONTIN) 100 MG capsule   Oral   Take 300 mg by mouth at bedtime.         Marland Kitchen HYDROcodone-acetaminophen (NORCO/VICODIN) 5-325 MG per tablet   Oral   Take 1 tablet by mouth every 6 (six) hours as needed for pain.         Marland Kitchen insulin aspart (NOVOLOG) 100 UNIT/ML injection   Subcutaneous   Inject 10 Units into the skin daily before supper.         . insulin glargine (LANTUS) 100 UNIT/ML injection   Subcutaneous   Inject 45 Units into the skin daily.          .  insulin lispro (HUMALOG) 100 UNIT/ML injection   Subcutaneous   Inject 10 Units into the skin daily. Hold if fsbs below 250         . levothyroxine (SYNTHROID, LEVOTHROID) 50 MCG tablet   Oral   Take 50 mcg by mouth daily.         . Multiple Vitamins-Minerals (HEALTHY EYES) TABS   Oral   Take 1 tablet by mouth daily.         . rosuvastatin (CRESTOR) 20 MG tablet   Oral   Take 20 mg by mouth daily.         Marland Kitchen senna-docusate (SENOKOT-S) 8.6-50 MG per tablet   Oral   Take 1 tablet by mouth at bedtime as needed.   30 tablet   0    BP 158/42  Pulse 68  Temp(Src) 98.4 F (36.9 C) (Oral)  Resp 18  SpO2 97%  Vital signs normal   Physical Exam  Nursing note and vitals reviewed. Constitutional: She is oriented to person, place, and time. She appears well-developed and well-nourished.  Non-toxic appearance. She does not appear ill. No distress.  HENT:  Head: Normocephalic.    Right Ear: External ear normal.  Left Ear: External ear normal.  Nose: Nose normal. No mucosal edema or rhinorrhea.  Mouth/Throat: Oropharynx is clear and moist and mucous membranes are normal. No dental abscesses or uvula swelling.  Some blood in her hair with small area of swelling ? abrasion  Eyes: Conjunctivae and EOM are normal. Pupils are equal, round, and reactive to light.  Neck: Normal range of motion and full passive range of motion without pain. Neck supple.  Cardiovascular: Normal rate, regular rhythm and normal heart sounds.  Exam reveals no gallop and no friction rub.   No murmur heard. Pulmonary/Chest: Effort normal and breath sounds normal. No respiratory distress. She has no wheezes. She has no rhonchi. She has no rales. She exhibits no tenderness and no crepitus.  Abdominal: Soft. Normal appearance and bowel sounds are normal. She exhibits no distension. There is no tenderness. There  is no rebound and no guarding.  Musculoskeletal: Normal range of motion. She exhibits no edema and  no tenderness.  Moves all extremities well.   Neurological: She is alert and oriented to person, place, and time. She has normal strength. No cranial nerve deficit.  Skin: Skin is warm, dry and intact. No rash noted. No erythema. No pallor.  Psychiatric: She has a normal mood and affect. Her speech is normal and behavior is normal. Her mood appears not anxious.    ED Course  Procedures (including critical care time)  Medications  insulin aspart (novoLOG) injection 5 Units (not administered)  lidocaine-EPINEPHrine-tetracaine (LET) solution (3 mLs Topical Given 04/07/13 1615)   Let was not applied by nursing staff.   LACERATION REPAIR Performed by: Ward Givens Authorized by: Ward Givens Consent: Verbal consent obtained. Risks and benefits: risks, benefits and alternatives were discussed Consent given by: patient Patient identity confirmed: provided demographic data Prepped and Draped in normal sterile fashion Wound explored  Laceration Location: right posterior scallp  Laceration Length: 1/4cm  No Foreign Bodies seen or palpated  Anesthesia: local infiltration   Irrigation method: syringe Amount of cleaning: standard  Skin closure: staple  Number of staples 1  Patient tolerance: Patient tolerated the procedure well with no immediate complications.    Labs Review Labs Reviewed  GLUCOSE, CAPILLARY - Abnormal; Notable for the following:    Glucose-Capillary 455 (*)    All other components within normal limits   Imaging Review No results found.  EKG Interpretation   None       MDM   Pt is feeling fine, did not want to come to the ED, she was given a small extra dose of insulin for her hyperglycemia.    1. Fall at nursing home, initial encounter   2. Laceration of scalp, initial encounter   3. Hyperglycemia     Plan discharge  Devoria Albe, MD, Franz Dell, MD 04/07/13 [id number]

## 2013-04-07 NOTE — ED Notes (Signed)
Bed: WA08 Expected date:  Expected time:  Means of arrival:  Comments: ems- 78 yo F, fell, hit head, no LOC

## 2013-04-15 ENCOUNTER — Encounter (HOSPITAL_BASED_OUTPATIENT_CLINIC_OR_DEPARTMENT_OTHER): Payer: Medicare Other | Attending: General Surgery

## 2013-04-15 DIAGNOSIS — L97409 Non-pressure chronic ulcer of unspecified heel and midfoot with unspecified severity: Secondary | ICD-10-CM | POA: Insufficient documentation

## 2013-04-15 DIAGNOSIS — E1169 Type 2 diabetes mellitus with other specified complication: Secondary | ICD-10-CM | POA: Insufficient documentation

## 2013-06-19 ENCOUNTER — Inpatient Hospital Stay (HOSPITAL_COMMUNITY)
Admission: EM | Admit: 2013-06-19 | Discharge: 2013-06-24 | DRG: 638 | Disposition: A | Discharge status: Skilled Nursing Facility | Payer: Medicare Other | Attending: Internal Medicine | Admitting: Internal Medicine

## 2013-06-19 DIAGNOSIS — R5383 Other fatigue: Secondary | ICD-10-CM

## 2013-06-19 DIAGNOSIS — E1142 Type 2 diabetes mellitus with diabetic polyneuropathy: Secondary | ICD-10-CM | POA: Diagnosis present

## 2013-06-19 DIAGNOSIS — G9340 Encephalopathy, unspecified: Secondary | ICD-10-CM | POA: Diagnosis present

## 2013-06-19 DIAGNOSIS — J449 Chronic obstructive pulmonary disease, unspecified: Secondary | ICD-10-CM | POA: Diagnosis present

## 2013-06-19 DIAGNOSIS — E119 Type 2 diabetes mellitus without complications: Secondary | ICD-10-CM | POA: Diagnosis present

## 2013-06-19 DIAGNOSIS — N179 Acute kidney failure, unspecified: Secondary | ICD-10-CM

## 2013-06-19 DIAGNOSIS — E162 Hypoglycemia, unspecified: Secondary | ICD-10-CM | POA: Diagnosis present

## 2013-06-19 DIAGNOSIS — R4182 Altered mental status, unspecified: Secondary | ICD-10-CM

## 2013-06-19 DIAGNOSIS — J4489 Other specified chronic obstructive pulmonary disease: Secondary | ICD-10-CM | POA: Diagnosis present

## 2013-06-19 DIAGNOSIS — E1169 Type 2 diabetes mellitus with other specified complication: Principal | ICD-10-CM | POA: Diagnosis present

## 2013-06-19 DIAGNOSIS — T383X5A Adverse effect of insulin and oral hypoglycemic [antidiabetic] drugs, initial encounter: Secondary | ICD-10-CM | POA: Diagnosis present

## 2013-06-19 DIAGNOSIS — J441 Chronic obstructive pulmonary disease with (acute) exacerbation: Secondary | ICD-10-CM

## 2013-06-19 DIAGNOSIS — H3530 Unspecified macular degeneration: Secondary | ICD-10-CM | POA: Diagnosis present

## 2013-06-19 DIAGNOSIS — Z981 Arthrodesis status: Secondary | ICD-10-CM

## 2013-06-19 DIAGNOSIS — E7800 Pure hypercholesterolemia, unspecified: Secondary | ICD-10-CM | POA: Diagnosis present

## 2013-06-19 DIAGNOSIS — E039 Hypothyroidism, unspecified: Secondary | ICD-10-CM | POA: Diagnosis present

## 2013-06-19 DIAGNOSIS — I739 Peripheral vascular disease, unspecified: Secondary | ICD-10-CM | POA: Diagnosis present

## 2013-06-19 DIAGNOSIS — L97509 Non-pressure chronic ulcer of other part of unspecified foot with unspecified severity: Secondary | ICD-10-CM | POA: Diagnosis present

## 2013-06-19 DIAGNOSIS — N189 Chronic kidney disease, unspecified: Secondary | ICD-10-CM

## 2013-06-19 DIAGNOSIS — B952 Enterococcus as the cause of diseases classified elsewhere: Secondary | ICD-10-CM | POA: Diagnosis present

## 2013-06-19 DIAGNOSIS — I129 Hypertensive chronic kidney disease with stage 1 through stage 4 chronic kidney disease, or unspecified chronic kidney disease: Secondary | ICD-10-CM | POA: Diagnosis present

## 2013-06-19 DIAGNOSIS — N183 Chronic kidney disease, stage 3 unspecified: Secondary | ICD-10-CM

## 2013-06-19 DIAGNOSIS — E1149 Type 2 diabetes mellitus with other diabetic neurological complication: Secondary | ICD-10-CM | POA: Diagnosis present

## 2013-06-19 DIAGNOSIS — N390 Urinary tract infection, site not specified: Secondary | ICD-10-CM | POA: Diagnosis present

## 2013-06-19 DIAGNOSIS — R5381 Other malaise: Secondary | ICD-10-CM | POA: Diagnosis present

## 2013-06-19 DIAGNOSIS — D649 Anemia, unspecified: Secondary | ICD-10-CM

## 2013-06-19 DIAGNOSIS — I10 Essential (primary) hypertension: Secondary | ICD-10-CM

## 2013-06-19 DIAGNOSIS — Z794 Long term (current) use of insulin: Secondary | ICD-10-CM

## 2013-06-19 DIAGNOSIS — Z7982 Long term (current) use of aspirin: Secondary | ICD-10-CM

## 2013-06-19 DIAGNOSIS — F17200 Nicotine dependence, unspecified, uncomplicated: Secondary | ICD-10-CM | POA: Diagnosis present

## 2013-06-19 HISTORY — DX: Headache: R51

## 2013-06-20 ENCOUNTER — Encounter (HOSPITAL_COMMUNITY): Payer: Self-pay | Admitting: Emergency Medicine

## 2013-06-20 ENCOUNTER — Encounter (HOSPITAL_BASED_OUTPATIENT_CLINIC_OR_DEPARTMENT_OTHER): Payer: Medicare Other | Attending: Internal Medicine

## 2013-06-20 DIAGNOSIS — N179 Acute kidney failure, unspecified: Secondary | ICD-10-CM

## 2013-06-20 DIAGNOSIS — R4182 Altered mental status, unspecified: Secondary | ICD-10-CM | POA: Insufficient documentation

## 2013-06-20 DIAGNOSIS — N189 Chronic kidney disease, unspecified: Secondary | ICD-10-CM

## 2013-06-20 DIAGNOSIS — G9340 Encephalopathy, unspecified: Secondary | ICD-10-CM | POA: Diagnosis present

## 2013-06-20 DIAGNOSIS — N390 Urinary tract infection, site not specified: Secondary | ICD-10-CM | POA: Diagnosis present

## 2013-06-20 DIAGNOSIS — E162 Hypoglycemia, unspecified: Secondary | ICD-10-CM

## 2013-06-20 DIAGNOSIS — E119 Type 2 diabetes mellitus without complications: Secondary | ICD-10-CM

## 2013-06-20 LAB — CBC WITH DIFFERENTIAL/PLATELET
BASOS ABS: 0 10*3/uL (ref 0.0–0.1)
Basophils Relative: 0 % (ref 0–1)
Eosinophils Absolute: 0.1 10*3/uL (ref 0.0–0.7)
Eosinophils Relative: 1 % (ref 0–5)
HCT: 32.7 % — ABNORMAL LOW (ref 36.0–46.0)
Hemoglobin: 10.4 g/dL — ABNORMAL LOW (ref 12.0–15.0)
LYMPHS PCT: 26 % (ref 12–46)
Lymphs Abs: 2.8 10*3/uL (ref 0.7–4.0)
MCH: 29.5 pg (ref 26.0–34.0)
MCHC: 31.8 g/dL (ref 30.0–36.0)
MCV: 92.6 fL (ref 78.0–100.0)
Monocytes Absolute: 1.4 10*3/uL — ABNORMAL HIGH (ref 0.1–1.0)
Monocytes Relative: 13 % — ABNORMAL HIGH (ref 3–12)
Neutro Abs: 6.6 10*3/uL (ref 1.7–7.7)
Neutrophils Relative %: 61 % (ref 43–77)
PLATELETS: 182 10*3/uL (ref 150–400)
RBC: 3.53 MIL/uL — AB (ref 3.87–5.11)
RDW: 13.5 % (ref 11.5–15.5)
WBC: 10.9 10*3/uL — AB (ref 4.0–10.5)

## 2013-06-20 LAB — CBC
HCT: 33.9 % — ABNORMAL LOW (ref 36.0–46.0)
Hemoglobin: 10.6 g/dL — ABNORMAL LOW (ref 12.0–15.0)
MCH: 29 pg (ref 26.0–34.0)
MCHC: 31.3 g/dL (ref 30.0–36.0)
MCV: 92.9 fL (ref 78.0–100.0)
PLATELETS: 188 10*3/uL (ref 150–400)
RBC: 3.65 MIL/uL — AB (ref 3.87–5.11)
RDW: 13.6 % (ref 11.5–15.5)
WBC: 10.7 10*3/uL — AB (ref 4.0–10.5)

## 2013-06-20 LAB — URINE MICROSCOPIC-ADD ON

## 2013-06-20 LAB — COMPREHENSIVE METABOLIC PANEL
ALK PHOS: 51 U/L (ref 39–117)
ALT: 8 U/L (ref 0–35)
ALT: 8 U/L (ref 0–35)
AST: 14 U/L (ref 0–37)
AST: 15 U/L (ref 0–37)
Albumin: 3.3 g/dL — ABNORMAL LOW (ref 3.5–5.2)
Albumin: 3.2 g/dL — ABNORMAL LOW (ref 3.5–5.2)
Alkaline Phosphatase: 48 U/L (ref 39–117)
BILIRUBIN TOTAL: 0.2 mg/dL — AB (ref 0.3–1.2)
BILIRUBIN TOTAL: 0.3 mg/dL (ref 0.3–1.2)
BUN: 56 mg/dL — ABNORMAL HIGH (ref 6–23)
BUN: 50 mg/dL — ABNORMAL HIGH (ref 6–23)
CHLORIDE: 108 meq/L (ref 96–112)
CO2: 24 mEq/L (ref 19–32)
CO2: 24 mEq/L (ref 19–32)
Calcium: 9.1 mg/dL (ref 8.4–10.5)
Calcium: 9.3 mg/dL (ref 8.4–10.5)
Chloride: 109 mEq/L (ref 96–112)
Creatinine, Ser: 2.51 mg/dL — ABNORMAL HIGH (ref 0.50–1.10)
Creatinine, Ser: 2.16 mg/dL — ABNORMAL HIGH (ref 0.50–1.10)
GFR calc Af Amer: 20 mL/min — ABNORMAL LOW (ref >90)
GFR calc Af Amer: 24 mL/min — ABNORMAL LOW (ref >90)
GFR calc non Af Amer: 21 mL/min — ABNORMAL LOW (ref >90)
GFR, EST NON AFRICAN AMERICAN: 18 mL/min — AB (ref >90)
GLUCOSE: 114 mg/dL — AB (ref 70–99)
Glucose, Bld: 95 mg/dL (ref 70–99)
Potassium: 4.8 mEq/L (ref 3.7–5.3)
Potassium: 4.6 mEq/L (ref 3.7–5.3)
SODIUM: 144 meq/L (ref 137–147)
Sodium: 146 mEq/L (ref 137–147)
Total Protein: 6.9 g/dL (ref 6.0–8.3)
Total Protein: 6.7 g/dL (ref 6.0–8.3)

## 2013-06-20 LAB — URINALYSIS, ROUTINE W REFLEX MICROSCOPIC
BILIRUBIN URINE: NEGATIVE
GLUCOSE, UA: NEGATIVE mg/dL
Ketones, ur: NEGATIVE mg/dL
Nitrite: NEGATIVE
PROTEIN: 100 mg/dL — AB
SPECIFIC GRAVITY, URINE: 1.014 (ref 1.005–1.030)
UROBILINOGEN UA: 0.2 mg/dL (ref 0.0–1.0)
pH: 5 (ref 5.0–8.0)

## 2013-06-20 LAB — HEMOGLOBIN A1C
Hgb A1c MFr Bld: 6.9 % — ABNORMAL HIGH (ref <5.7)
Mean Plasma Glucose: 151 mg/dL — ABNORMAL HIGH (ref <117)

## 2013-06-20 LAB — GLUCOSE, CAPILLARY
GLUCOSE-CAPILLARY: 106 mg/dL — AB (ref 70–99)
GLUCOSE-CAPILLARY: 124 mg/dL — AB (ref 70–99)
GLUCOSE-CAPILLARY: 143 mg/dL — AB (ref 70–99)
GLUCOSE-CAPILLARY: 289 mg/dL — AB (ref 70–99)
GLUCOSE-CAPILLARY: 92 mg/dL (ref 70–99)
Glucose-Capillary: 74 mg/dL (ref 70–99)
Glucose-Capillary: 74 mg/dL (ref 70–99)
Glucose-Capillary: 161 mg/dL — ABNORMAL HIGH (ref 70–99)
Glucose-Capillary: 261 mg/dL — ABNORMAL HIGH (ref 70–99)

## 2013-06-20 LAB — MRSA PCR SCREENING: MRSA by PCR: NEGATIVE

## 2013-06-20 LAB — TROPONIN I: Troponin I: <0.3 ng/mL (ref <0.30)

## 2013-06-20 LAB — TSH: TSH: 1.155 u[IU]/mL (ref 0.350–4.500)

## 2013-06-20 MED ORDER — GABAPENTIN 100 MG PO CAPS
100.0000 mg | ORAL_CAPSULE | ORAL | Status: DC
  Administered 2013-06-20 – 2013-06-24 (×10): 100 mg via ORAL
  Filled 2013-06-20 (×11): qty 1

## 2013-06-20 MED ORDER — ONDANSETRON HCL 4 MG/2ML IJ SOLN: 4.0000 mg | Freq: Four times a day (QID) | INTRAMUSCULAR | Status: DC | PRN

## 2013-06-20 MED ORDER — GABAPENTIN 100 MG PO CAPS: 100.0000 mg | ORAL_CAPSULE | Freq: Three times a day (TID) | ORAL | Status: DC

## 2013-06-20 MED ORDER — DEXTROSE-NACL 5-0.9 % IV SOLN
INTRAVENOUS | Status: DC
  Administered 2013-06-20: 06:00:00 via INTRAVENOUS

## 2013-06-20 MED ORDER — SKIN PREP WIPES MISC: 1.0000 "application " | Freq: Two times a day (BID) | Status: DC

## 2013-06-20 MED ORDER — PROSIGHT PO TABS
1.0000 | ORAL_TABLET | Freq: Every day | ORAL | Status: DC
  Administered 2013-06-20 – 2013-06-24 (×5): 1 via ORAL
  Filled 2013-06-20 (×5): qty 1

## 2013-06-20 MED ORDER — ONDANSETRON HCL 4 MG PO TABS: 4.0000 mg | ORAL_TABLET | Freq: Four times a day (QID) | ORAL | Status: DC | PRN

## 2013-06-20 MED ORDER — DEXTROSE 5 % IV SOLN
1.0000 g | INTRAVENOUS | Status: DC
  Administered 2013-06-20 – 2013-06-21 (×2): 1 g via INTRAVENOUS
  Filled 2013-06-20 (×2): qty 10

## 2013-06-20 MED ORDER — IPRATROPIUM BROMIDE 0.02 % IN SOLN
0.5000 mg | Freq: Once | RESPIRATORY_TRACT | Status: AC
  Administered 2013-06-20: 0.5 mg via RESPIRATORY_TRACT
  Filled 2013-06-20: qty 2.5

## 2013-06-20 MED ORDER — CARVEDILOL 25 MG PO TABS
25.0000 mg | ORAL_TABLET | Freq: Two times a day (BID) | ORAL | Status: DC
  Administered 2013-06-20 – 2013-06-24 (×10): 25 mg via ORAL
  Filled 2013-06-20 (×11): qty 1

## 2013-06-20 MED ORDER — DEXTROSE-NACL 5-0.9 % IV SOLN
INTRAVENOUS | Status: AC
  Administered 2013-06-21: 04:00:00 via INTRAVENOUS

## 2013-06-20 MED ORDER — HYDROCODONE-ACETAMINOPHEN 5-325 MG PO TABS
1.0000 | ORAL_TABLET | Freq: Three times a day (TID) | ORAL | Status: DC | PRN
  Administered 2013-06-20 – 2013-06-24 (×3): 1 via ORAL
  Filled 2013-06-20 (×3): qty 1

## 2013-06-20 MED ORDER — SODIUM CHLORIDE 0.9 % IV BOLUS (SEPSIS)
500.0000 mL | Freq: Once | INTRAVENOUS | Status: AC
Start: 2013-06-20 — End: 2013-06-20
  Administered 2013-06-20: 500 mL via INTRAVENOUS

## 2013-06-20 MED ORDER — SENNOSIDES-DOCUSATE SODIUM 8.6-50 MG PO TABS: 1.0000 | ORAL_TABLET | Freq: Every evening | ORAL | Status: DC | PRN

## 2013-06-20 MED ORDER — ENOXAPARIN SODIUM 40 MG/0.4ML ~~LOC~~ SOLN
40.0000 mg | SUBCUTANEOUS | Status: DC
  Administered 2013-06-20: 40 mg via SUBCUTANEOUS
  Filled 2013-06-20 (×2): qty 0.4

## 2013-06-20 MED ORDER — ASPIRIN 81 MG PO CHEW
81.0000 mg | CHEWABLE_TABLET | Freq: Every morning | ORAL | Status: DC
  Administered 2013-06-20 – 2013-06-24 (×5): 81 mg via ORAL
  Filled 2013-06-20 (×5): qty 1

## 2013-06-20 MED ORDER — CLONIDINE HCL 0.2 MG PO TABS
0.2000 mg | ORAL_TABLET | Freq: Two times a day (BID) | ORAL | Status: DC
  Administered 2013-06-20 – 2013-06-24 (×9): 0.2 mg via ORAL
  Filled 2013-06-20 (×4): qty 1
  Filled 2013-06-20: qty 2
  Filled 2013-06-20 (×5): qty 1
  Filled 2013-06-20: qty 2
  Filled 2013-06-20: qty 1

## 2013-06-20 MED ORDER — MOMETASONE FURO-FORMOTEROL FUM 100-5 MCG/ACT IN AERO
2.0000 | INHALATION_SPRAY | Freq: Two times a day (BID) | RESPIRATORY_TRACT | Status: DC
  Administered 2013-06-20 – 2013-06-24 (×8): 2 via RESPIRATORY_TRACT
  Filled 2013-06-20: qty 8.8

## 2013-06-20 MED ORDER — GUAIFENESIN 100 MG/5ML PO SOLN
10.0000 mL | ORAL | Status: DC | PRN
  Filled 2013-06-20: qty 10

## 2013-06-20 MED ORDER — ALBUTEROL SULFATE (2.5 MG/3ML) 0.083% IN NEBU
5.0000 mg | INHALATION_SOLUTION | Freq: Once | RESPIRATORY_TRACT | Status: AC
  Administered 2013-06-20: 5 mg via RESPIRATORY_TRACT
  Filled 2013-06-20: qty 6

## 2013-06-20 MED ORDER — IPRATROPIUM BROMIDE 0.02 % IN SOLN: 0.5000 mg | Freq: Four times a day (QID) | RESPIRATORY_TRACT | Status: DC | PRN

## 2013-06-20 MED ORDER — AMITRIPTYLINE HCL 50 MG PO TABS
50.0000 mg | ORAL_TABLET | Freq: Every day | ORAL | Status: DC
  Administered 2013-06-20 – 2013-06-23 (×4): 50 mg via ORAL
  Filled 2013-06-20 (×5): qty 1

## 2013-06-20 MED ORDER — SERTRALINE HCL 50 MG PO TABS
50.0000 mg | ORAL_TABLET | Freq: Every day | ORAL | Status: DC
  Administered 2013-06-20 – 2013-06-24 (×5): 50 mg via ORAL
  Filled 2013-06-20 (×6): qty 1

## 2013-06-20 MED ORDER — SODIUM CHLORIDE 0.9 % IV SOLN
INTRAVENOUS | Status: DC
  Administered 2013-06-20: 02:00:00 via INTRAVENOUS

## 2013-06-20 MED ORDER — POLYETHYLENE GLYCOL 3350 17 G PO PACK
51.0000 g | PACK | Freq: Every day | ORAL | Status: DC | PRN
  Filled 2013-06-20: qty 3

## 2013-06-20 MED ORDER — ATORVASTATIN CALCIUM 40 MG PO TABS
40.0000 mg | ORAL_TABLET | Freq: Every day | ORAL | Status: DC
  Administered 2013-06-20 – 2013-06-24 (×5): 40 mg via ORAL
  Filled 2013-06-20 (×5): qty 1

## 2013-06-20 MED ORDER — ALPRAZOLAM 0.25 MG PO TABS
0.2500 mg | ORAL_TABLET | Freq: Two times a day (BID) | ORAL | Status: DC | PRN
  Administered 2013-06-23: 0.25 mg via ORAL
  Filled 2013-06-20: qty 1

## 2013-06-20 MED ORDER — ACETAMINOPHEN 325 MG PO TABS
650.0000 mg | ORAL_TABLET | Freq: Four times a day (QID) | ORAL | Status: DC | PRN
Start: 2013-06-20 — End: 2013-06-24

## 2013-06-20 MED ORDER — ACETAMINOPHEN 650 MG RE SUPP: 650.0000 mg | Freq: Four times a day (QID) | RECTAL | Status: DC | PRN

## 2013-06-20 MED ORDER — LEVOTHYROXINE SODIUM 50 MCG PO TABS
50.0000 ug | ORAL_TABLET | Freq: Every day | ORAL | Status: DC
  Administered 2013-06-20 – 2013-06-24 (×5): 50 ug via ORAL
  Filled 2013-06-20 (×6): qty 1

## 2013-06-20 MED ORDER — GABAPENTIN 300 MG PO CAPS
300.0000 mg | ORAL_CAPSULE | ORAL | Status: DC
  Administered 2013-06-20 – 2013-06-23 (×4): 300 mg via ORAL
  Filled 2013-06-20 (×5): qty 1

## 2013-06-20 MED ORDER — HEALTHY EYES PO TABS: 1.0000 | ORAL_TABLET | Freq: Every morning | ORAL | Status: DC

## 2013-06-20 MED ORDER — AMLODIPINE BESYLATE 10 MG PO TABS
10.0000 mg | ORAL_TABLET | Freq: Every morning | ORAL | Status: DC
  Administered 2013-06-20 – 2013-06-24 (×5): 10 mg via ORAL
  Filled 2013-06-20 (×5): qty 1

## 2013-06-20 MED ORDER — ALBUTEROL SULFATE (2.5 MG/3ML) 0.083% IN NEBU: 2.5000 mg | INHALATION_SOLUTION | Freq: Four times a day (QID) | RESPIRATORY_TRACT | Status: DC | PRN

## 2013-06-20 NOTE — ED Notes (Signed)
Attempted to call report to [address] [place]. RN no able to accept report at this time.

## 2013-06-20 NOTE — Progress Notes (Signed)
Pt admitted to unit from [facility] ED. Pt is alert and oriented to staff, call bell, and room. Bed in lowest position. Call bell within reach. Full assessment to Epic. Will continue to monitor.

## 2013-06-20 NOTE — ED Notes (Signed)
Patient arrives from [place] with complaint of hypogycemia. Staff reported patien was more lethargic than usual. EMS found Glucose to be 40 @ 2300 upon arrival. IV started, 1amp D50 given @ 2303, BG recovered to 224 @ 2313. Patient did not begin to speak for approximately 15 mins. En route EMS reported that patients HRs were noted in the 140s and were A-Fib/A-Flutter, but were not able to capture a strip.

## 2013-06-20 NOTE — H&P (Signed)
[facility] History and Physical  [name] YKD:[id number] DOB: [birth year] DOA: 06/19/2013  Referring physician: ER physician. PCP: Mayra Neer, MD   Chief Complaint: Confusion.  HPI: [name] is a 79 y.o. female history of diabetes mellitus, hypertension, chronic kidney disease, COPD, peripheral arterial disease was brought to the ER the patient was found confused. At her living facility patient was found to have a CBG of 40 and D50 was given a fall in which she became more alert and awake and was brought to the ER. In the ER patient is alert and awake and nonfocal and blood sugars still around 60s. Patient's lab work shows worsening of her creatinine from 1.8-2.5 and UA showed features concerning for UTI. Patient has been started on D5 normal and ceftriaxone and admitted for further workup. On exam patient is nonfocal denies any chest pain or shortness of breath abdominal pain nausea vomiting or diarrhea. Patient states she has not been eating well of recently because of poor appetite and taste.   Review of Systems: As presented in the history of presenting illness, rest negative.  Past Medical History  Diagnosis Date  . Hypertension   . Macular degeneration, bilateral   . Type II diabetes mellitus   . COPD (chronic obstructive pulmonary disease)   . Diabetic peripheral neuropathy   . Hypercholesteremia   . PAD (peripheral artery disease)   . Chronic kidney disease    Past Surgical History  Procedure Laterality Date  . Tonsillectomy  1940's  . Abdominal hysterectomy  1970's  . Dilation and curettage of uterus  1970's    "probably 2" (11/01/2012)  . Cataract extraction w/ intraocular lens  implant, bilateral Bilateral ~ 2010  . Bypass graft Right ~ 1997    RLE by Dr. Gwenlyn Perking 11/29/2005 (11/01/2012)  . Cystostomy w/ bladder biopsy  2005    Archie Endo 09/19/2003 (11/01/2012)  . Cardiac catheterization  11/29/2005    Archie Endo 11/29/2005 (11/01/2012)  . Anterior cervical  decomp/discectomy fusion  01/02/2006    Archie Endo 01/02/2006 (11/01/2012)  . Shoulder open rotator cuff repair Bilateral 1990's-2000's    "3X on the left; twice on the right" (11/01/2012)   Social History:  reports that she has been smoking Cigarettes.  She has a 7.2 pack-year smoking history. She has never used smokeless tobacco. She reports that she does not drink alcohol or use illicit drugs. Where does patient live nursing home. Can patient participate in ADLs? No.  Allergies  Allergen Reactions  . Codeine Other (See Comments)    unknown  . Lisinopril Other (See Comments)    unknown  . Penicillins Hives  . Sulfur Hives and Rash    Break out     Family History: History reviewed. No pertinent family history.    Prior to Admission medications   Medication Sig Start Date End Date Taking? Authorizing Provider  acetaminophen (TYLENOL) 500 MG tablet Take 500 mg by mouth every 4 (four) hours as needed for mild pain, fever or headache.   Yes Historical Provider, MD  albuterol (PROVENTIL) (2.5 MG/3ML) 0.083% nebulizer solution Take 2.5 mg by nebulization every 6 (six) hours as needed for wheezing.   Yes Historical Provider, MD  ALPRAZolam (XANAX) 0.25 MG tablet Take 0.25 mg by mouth 2 (two) times daily as needed for anxiety.   Yes Historical Provider, MD  amitriptyline (ELAVIL) 50 MG tablet Take 50 mg by mouth at bedtime.   Yes Historical Provider, MD  amLODipine (NORVASC) 10 MG tablet Take 10 mg by  mouth every morning.    Yes Historical Provider, MD  aspirin 81 MG chewable tablet Chew 81 mg by mouth every morning.    Yes Historical Provider, MD  carvedilol (COREG) 25 MG tablet Take 25 mg by mouth 2 (two) times daily with a meal. 0800 and 1700   Yes Historical Provider, MD  cloNIDine (CATAPRES) 0.2 MG tablet Take 0.2 mg by mouth 2 (two) times daily.   Yes Historical Provider, MD  Fluticasone-Salmeterol (ADVAIR) 100-50 MCG/DOSE AEPB Inhale 1 puff into the lungs every 12 (twelve) hours.   Yes  Historical Provider, MD  furosemide (LASIX) 40 MG tablet Take 40 mg by mouth daily.   Yes Historical Provider, MD  gabapentin (NEURONTIN) 100 MG capsule Take 100-300 mg by mouth 3 (three) times daily. 1 capsule at 0800, 1 capsule at 1400, and 3 capsules at 2000.   Yes Historical Provider, MD  guaiFENesin (ROBITUSSIN) 100 MG/5ML SOLN Take 10 mLs by mouth every 4 (four) hours as needed for cough or to loosen phlegm.   Yes Historical Provider, MD  HYDROcodone-acetaminophen (NORCO/VICODIN) 5-325 MG per tablet Take 1 tablet by mouth every 8 (eight) hours as needed for moderate pain or severe pain.    Yes Historical Provider, MD  insulin glargine (LANTUS) 100 UNIT/ML injection Inject 60 Units into the skin every morning.    Yes Historical Provider, MD  insulin lispro (HUMALOG) 100 UNIT/ML injection Inject 15 Units into the skin 3 (three) times daily with meals.    Yes Historical Provider, MD  ipratropium (ATROVENT) 0.02 % nebulizer solution Take 0.5 mg by nebulization every 6 (six) hours as needed for wheezing or shortness of breath.   Yes Historical Provider, MD  levothyroxine (SYNTHROID, LEVOTHROID) 50 MCG tablet Take 50 mcg by mouth every morning.    Yes Historical Provider, MD  losartan (COZAAR) 25 MG tablet Take 25 mg by mouth daily.   Yes Historical Provider, MD  Multiple Vitamins-Minerals (HEALTHY EYES) TABS Take 1 tablet by mouth every morning.    Yes Historical Provider, MD  Ostomy Supplies (SKIN PREP WIPES) MISC Apply 1 application topically 2 (two) times daily.   Yes Historical Provider, MD  polyethylene glycol (MIRALAX / GLYCOLAX) packet Take 51 g by mouth daily as needed for mild constipation, moderate constipation or severe constipation.   Yes Historical Provider, MD  rosuvastatin (CRESTOR) 20 MG tablet Take 20 mg by mouth every evening.    Yes Historical Provider, MD  senna-docusate (SENOKOT-S) 8.6-50 MG per tablet Take 1 tablet by mouth at bedtime as needed for mild constipation or moderate  constipation.   Yes Historical Provider, MD  sertraline (ZOLOFT) 50 MG tablet Take 50 mg by mouth daily.   Yes Historical Provider, MD    Physical Exam: Filed Vitals:   06/20/13 0244 06/20/13 0300 06/20/13 0331 06/20/13 0505  BP: 137/37 104/45 115/89 110/51  Pulse: 57 63 52 63  Resp: 58 18 17 16   SpO2: 100% 98% 95% 97%     General:  Well-developed and nourished.  Eyes: Anicteric no pallor.  ENT: No discharge from the ears eyes nose mouth.  Neck: No mass felt.  Cardiovascular: S1-S2 heard.  Respiratory: No rhonchi or crepitations.  Abdomen: Soft nontender bowel sounds present.  Skin:  No rash.  Musculoskeletal: No edema.  Psychiatric: Patient is alert awake oriented to time place and person.  Neurologic: Moves all extremities 5 x 5. No facial asymmetry.  Labs on Admission:  Basic Metabolic Panel:  Recent Labs Lab 06/20/13 0044  NA 144  K 4.8  CL 108  CO2 24  GLUCOSE 95  BUN 56*  CREATININE 2.51*  CALCIUM 9.1   Liver Function Tests:  Recent Labs Lab 06/20/13 0044  AST 15  ALT 8  ALKPHOS 48  BILITOT 0.3  PROT 6.9  ALBUMIN 3.3*   No results found for this basename: LIPASE, AMYLASE,  in the last 168 hours No results found for this basename: AMMONIA,  in the last 168 hours CBC:  Recent Labs Lab 06/20/13 0044  WBC 10.7*  HGB 10.6*  HCT 33.9*  MCV 92.9  PLT 188   Cardiac Enzymes:  Recent Labs Lab 06/20/13 0044  TROPONINI <0.30    BNP (last 3 results) No results found for this basename: PROBNP,  in the last 8760 hours CBG:  Recent Labs Lab 06/20/13 0013 06/20/13 0111 06/20/13 0557  GLUCAP 106* 92 74    Radiological Exams on Admission: No results found.  EKG: Independently reviewed. Sinus bradycardia with beats around 53 beats per minute. EKG is comparable to old EKG. Monitor is showing a heart rate of 67 beats per minute.  Assessment/Plan Principal Problem:   Acute encephalopathy Active Problems:   HYPOTHYROIDISM   DM    Hypoglycemia   Renal failure (ARF), acute on chronic   UTI (lower urinary tract infection)   1. Acute encephalopathy most likely secondary to hypoglycemia in a patient with diabetes mellitus type 2 - which has essentially resolved at this time. At this time I'm holding off patient's Lantus and schedule insulin dose. Patient became hypoglycemic probably secondary to poor oral intake with worsening renal function. At this time I will continue with D5 normal saline at 50 cc per hour and check CBGs every 2 hours. Once patient actually improves and patient eats patient will need at least low-dose Lantus soon. 2. Acute on chronic renal failure - probably secondary to poor oral intake. I'm holding off patient's Cozaar and Lasix for now. Gently hydrate. Closely follow intake output and metabolic panel. Check urine sodium and creatinine. 3. UTI - patient is on ceftriaxone. Follow urine cultures. 4. Hypertension - due to acute renal failure patient's Lasix and Cozaar is on hold. I will place patient on IV hydralazine for systolic blood pressure more than 160. 5. COPD - presently not wheezing. Continue inhalers. 6. Hypothyroidism - continue Synthroid. Check TSH. 7. Peripheral vascular disease - continue antiplatelet agents.  I have reviewed patient's old charts and labs.  Code Status: Full code.  Family Communication: None.  Disposition Plan: Admit to inpatient.    Landri Dorsainvil N. [facility] Pager [phone].  If 7PM-7AM, please contact night-coverage [email or web address] Password [facility] 06/20/2013, 7:18 AM

## 2013-06-20 NOTE — ED Provider Notes (Signed)
CSN: [id number]     Arrival date & time 06/19/13  2352 History   First MD Initiated Contact with Patient 06/20/13 [phone]     Chief Complaint  Patient presents with  . Hypoglycemia   (Consider location/radiation/quality/duration/timing/severity/associated sxs/prior Treatment) Patient is a 79 y.o. female presenting with hypoglycemia. The history is provided by the patient and the EMS personnel. The history is limited by the condition of the patient.  Hypoglycemia Associated symptoms: no shortness of breath and no vomiting   pt with hx iddm, was at ecf, blood sugar low at ecf 40, gave d50 w blood sugar and mental status improving. Pt very poor historian, limited responsiveness to questions - level 5 caveat.  Pt denies change in meds/insulin. Had eaten today, ?less than normal. No nvd. No gu c/o. No fever or chills.    Past Medical History  Diagnosis Date  . Hypertension   . Macular degeneration, bilateral   . Type II diabetes mellitus   . COPD (chronic obstructive pulmonary disease)   . Diabetic peripheral neuropathy   . Hypercholesteremia   . PAD (peripheral artery disease)   . Chronic kidney disease    Past Surgical History  Procedure Laterality Date  . Tonsillectomy  1940's  . Abdominal hysterectomy  1970's  . Dilation and curettage of uterus  1970's    "probably 2" (11/01/2012)  . Cataract extraction w/ intraocular lens  implant, bilateral Bilateral ~ 2010  . Bypass graft Right ~ 1997    RLE by Dr. Gwenlyn Perking 11/29/2005 (11/01/2012)  . Cystostomy w/ bladder biopsy  2005    Archie Endo 09/19/2003 (11/01/2012)  . Cardiac catheterization  11/29/2005    Archie Endo 11/29/2005 (11/01/2012)  . Anterior cervical decomp/discectomy fusion  01/02/2006    Archie Endo 01/02/2006 (11/01/2012)  . Shoulder open rotator cuff repair Bilateral 1990's-2000's    "3X on the left; twice on the right" (11/01/2012)   History reviewed. No pertinent family history. History  Substance Use Topics  . Smoking status: Current  Every Day Smoker -- 0.12 packs/day for 60 years    Types: Cigarettes  . Smokeless tobacco: Never Used  . Alcohol Use: No   OB History   Grav Para Term Preterm Abortions TAB SAB Ect Mult Living                 Review of Systems  Constitutional: Negative for fever and chills.  HENT: Negative for sore throat.   Eyes: Negative for redness.  Respiratory: Negative for cough and shortness of breath.   Cardiovascular: Negative for chest pain.  Gastrointestinal: Negative for vomiting, abdominal pain and diarrhea.  Genitourinary: Negative for dysuria and flank pain.  Musculoskeletal: Negative for back pain and neck pain.  Skin: Negative for rash.  Neurological: Negative for headaches.  Hematological: Does not bruise/bleed easily.  Psychiatric/Behavioral: Positive for confusion.    Allergies  Codeine; Lisinopril; Penicillins; and Sulfur  Home Medications   Current Outpatient Rx  Name  Route  Sig  Dispense  Refill  . acetaminophen (TYLENOL) 500 MG tablet   Oral   Take 500 mg by mouth every 4 (four) hours as needed for mild pain, fever or headache.         . albuterol (PROVENTIL) (2.5 MG/3ML) 0.083% nebulizer solution   Nebulization   Take 2.5 mg by nebulization every 6 (six) hours as needed for wheezing.         Marland Kitchen ALPRAZolam (XANAX) 0.25 MG tablet   Oral   Take 0.25 mg by mouth 2 (  two) times daily as needed for anxiety.         Marland Kitchen amitriptyline (ELAVIL) 50 MG tablet   Oral   Take 50 mg by mouth at bedtime.         Marland Kitchen amLODipine (NORVASC) 10 MG tablet   Oral   Take 10 mg by mouth every morning.          Marland Kitchen aspirin 81 MG chewable tablet   Oral   Chew 81 mg by mouth every morning.          . carvedilol (COREG) 25 MG tablet   Oral   Take 25 mg by mouth 2 (two) times daily with a meal. 0800 and 1700         . cloNIDine (CATAPRES) 0.2 MG tablet   Oral   Take 0.2 mg by mouth 2 (two) times daily.         . Fluticasone-Salmeterol (ADVAIR) 100-50 MCG/DOSE AEPB    Inhalation   Inhale 1 puff into the lungs every 12 (twelve) hours.         . furosemide (LASIX) 20 MG tablet   Oral   Take 20 mg by mouth every morning.          . gabapentin (NEURONTIN) 100 MG capsule   Oral   Take 100-300 mg by mouth 3 (three) times daily. 1 capsule at 0800, 1 capsule at 1400, and 3 capsules at 2000.         Marland Kitchen guaiFENesin (ROBITUSSIN) 100 MG/5ML SOLN   Oral   Take 10 mLs by mouth every 4 (four) hours as needed for cough or to loosen phlegm.         Marland Kitchen HYDROcodone-acetaminophen (NORCO/VICODIN) 5-325 MG per tablet   Oral   Take 1 tablet by mouth every 8 (eight) hours as needed for moderate pain or severe pain.          Marland Kitchen insulin glargine (LANTUS) 100 UNIT/ML injection   Subcutaneous   Inject 45 Units into the skin every morning.          . insulin lispro (HUMALOG) 100 UNIT/ML injection   Subcutaneous   Inject 10 Units into the skin daily before supper.          Marland Kitchen ipratropium (ATROVENT) 0.02 % nebulizer solution   Nebulization   Take 0.5 mg by nebulization every 6 (six) hours as needed for wheezing or shortness of breath.         . levothyroxine (SYNTHROID, LEVOTHROID) 50 MCG tablet   Oral   Take 50 mcg by mouth every morning.          . loperamide (IMODIUM) 2 MG capsule   Oral   Take 2 mg by mouth as needed for diarrhea or loose stools.         . Multiple Vitamins-Minerals (HEALTHY EYES) TABS   Oral   Take 1 tablet by mouth every morning.          Oneta Rack Supplies (SKIN PREP WIPES) MISC   Topical   Apply 1 application topically 2 (two) times daily.         . rosuvastatin (CRESTOR) 20 MG tablet   Oral   Take 20 mg by mouth every evening.          . senna-docusate (SENOKOT-S) 8.6-50 MG per tablet   Oral   Take 1 tablet by mouth at bedtime as needed for mild constipation or moderate constipation.  BP 117/34  Pulse 53  Resp 20  SpO2 92% Physical Exam  Nursing note and vitals reviewed. Constitutional: She  appears well-developed and well-nourished. No distress.  HENT:  Head: Atraumatic.  Mouth/Throat: Oropharynx is clear and moist.  Eyes: Conjunctivae are normal. Pupils are equal, round, and reactive to light. No scleral icterus.  Neck: Neck supple. No tracheal deviation present.  No stiffness or rigidity  Cardiovascular: Normal rate, regular rhythm, normal heart sounds and intact distal pulses.   Pulmonary/Chest: Effort normal and breath sounds normal. No respiratory distress.  Abdominal: Soft. Normal appearance and bowel sounds are normal. She exhibits no distension and no mass. There is no tenderness. There is no rebound and no guarding.  Genitourinary:  No cva tenderness  Musculoskeletal: She exhibits no edema and no tenderness.  Neurological: She is alert. No cranial nerve deficit.  Motor intact bil.   Skin: Skin is warm and dry. No rash noted. She is not diaphoretic.    ED Course  Procedures (including critical care time)  Results for orders placed during the hospital encounter of 06/19/13  URINE CULTURE      Result Value Range   Specimen Description URINE, CATHETERIZED     Special Requests NONE     Culture  Setup Time       Value: 06/20/2013 08:38     Performed at [facility]       Value: >=100,000 COLONIES/ML     Performed at [facility]   Culture       Value: ENTEROCOCCUS SPECIES     Performed at [facility]   Report Status 06/22/2013 FINAL     Organism ID, Bacteria ENTEROCOCCUS SPECIES    MRSA PCR SCREENING      Result Value Range   MRSA by PCR NEGATIVE  NEGATIVE  GLUCOSE, CAPILLARY      Result Value Range   Glucose-Capillary 106 (*) 70 - 99 mg/dL  CBC      Result Value Range   WBC 10.7 (*) 4.0 - 10.5 K/uL   RBC 3.65 (*) 3.87 - 5.11 MIL/uL   Hemoglobin 10.6 (*) 12.0 - 15.0 g/dL   HCT 33.9 (*) 36.0 - 46.0 %   MCV 92.9  78.0 - 100.0 fL   MCH 29.0  26.0 - 34.0 pg   MCHC 31.3  30.0 - 36.0 g/dL   RDW 13.6  11.5 - 15.5 %    Platelets 188  150 - 400 K/uL  COMPREHENSIVE METABOLIC PANEL      Result Value Range   Sodium 144  137 - 147 mEq/L   Potassium 4.8  3.7 - 5.3 mEq/L   Chloride 108  96 - 112 mEq/L   CO2 24  19 - 32 mEq/L   Glucose, Bld 95  70 - 99 mg/dL   BUN 56 (*) 6 - 23 mg/dL   Creatinine, Ser 2.51 (*) 0.50 - 1.10 mg/dL   Calcium 9.1  8.4 - 10.5 mg/dL   Total Protein 6.9  6.0 - 8.3 g/dL   Albumin 3.3 (*) 3.5 - 5.2 g/dL   AST 15  0 - 37 U/L   ALT 8  0 - 35 U/L   Alkaline Phosphatase 48  39 - 117 U/L   Total Bilirubin 0.3  0.3 - 1.2 mg/dL   GFR calc non Af Amer 18 (*) >90 mL/min   GFR calc Af Amer 20 (*) >90 mL/min  URINALYSIS, ROUTINE W REFLEX MICROSCOPIC  Result Value Range   Color, Urine YELLOW  YELLOW   APPearance CLOUDY (*) CLEAR   Specific Gravity, Urine 1.014  1.005 - 1.030   pH 5.0  5.0 - 8.0   Glucose, UA NEGATIVE  NEGATIVE mg/dL   Hgb urine dipstick SMALL (*) NEGATIVE   Bilirubin Urine NEGATIVE  NEGATIVE   Ketones, ur NEGATIVE  NEGATIVE mg/dL   Protein, ur 100 (*) NEGATIVE mg/dL   Urobilinogen, UA 0.2  0.0 - 1.0 mg/dL   Nitrite NEGATIVE  NEGATIVE   Leukocytes, UA MODERATE (*) NEGATIVE  TROPONIN I      Result Value Range   Troponin I <0.30  <0.30 ng/mL  GLUCOSE, CAPILLARY      Result Value Range   Glucose-Capillary 92  70 - 99 mg/dL  URINE MICROSCOPIC-ADD ON      Result Value Range   Squamous Epithelial / LPF RARE  RARE   WBC, UA TOO NUMEROUS TO COUNT  <3 WBC/hpf   RBC / HPF 3-6  <3 RBC/hpf   Bacteria, UA MANY (*) RARE   Casts HYALINE CASTS (*) NEGATIVE  GLUCOSE, CAPILLARY      Result Value Range   Glucose-Capillary 74  70 - 99 mg/dL  HEMOGLOBIN A1C      Result Value Range   Hemoglobin A1C 6.9 (*) <5.7 %   Mean Plasma Glucose 151 (*) <117 mg/dL  TSH      Result Value Range   TSH 1.155  0.350 - 4.500 uIU/mL  COMPREHENSIVE METABOLIC PANEL      Result Value Range   Sodium 146  137 - 147 mEq/L   Potassium 4.6  3.7 - 5.3 mEq/L   Chloride 109  96 - 112 mEq/L    CO2 24  19 - 32 mEq/L   Glucose, Bld 114 (*) 70 - 99 mg/dL   BUN 50 (*) 6 - 23 mg/dL   Creatinine, Ser 2.16 (*) 0.50 - 1.10 mg/dL   Calcium 9.3  8.4 - 10.5 mg/dL   Total Protein 6.7  6.0 - 8.3 g/dL   Albumin 3.2 (*) 3.5 - 5.2 g/dL   AST 14  0 - 37 U/L   ALT 8  0 - 35 U/L   Alkaline Phosphatase 51  39 - 117 U/L   Total Bilirubin 0.2 (*) 0.3 - 1.2 mg/dL   GFR calc non Af Amer 21 (*) >90 mL/min   GFR calc Af Amer 24 (*) >90 mL/min  CBC WITH DIFFERENTIAL      Result Value Range   WBC 10.9 (*) 4.0 - 10.5 K/uL   RBC 3.53 (*) 3.87 - 5.11 MIL/uL   Hemoglobin 10.4 (*) 12.0 - 15.0 g/dL   HCT 32.7 (*) 36.0 - 46.0 %   MCV 92.6  78.0 - 100.0 fL   MCH 29.5  26.0 - 34.0 pg   MCHC 31.8  30.0 - 36.0 g/dL   RDW 13.5  11.5 - 15.5 %   Platelets 182  150 - 400 K/uL   Neutrophils Relative % 61  43 - 77 %   Neutro Abs 6.6  1.7 - 7.7 K/uL   Lymphocytes Relative 26  12 - 46 %   Lymphs Abs 2.8  0.7 - 4.0 K/uL   Monocytes Relative 13 (*) 3 - 12 %   Monocytes Absolute 1.4 (*) 0.1 - 1.0 K/uL   Eosinophils Relative 1  0 - 5 %   Eosinophils Absolute 0.1  0.0 - 0.7 K/uL   Basophils Relative  0  0 - 1 %   Basophils Absolute 0.0  0.0 - 0.1 K/uL  GLUCOSE, CAPILLARY      Result Value Range   Glucose-Capillary 74  70 - 99 mg/dL  GLUCOSE, CAPILLARY      Result Value Range   Glucose-Capillary 124 (*) 70 - 99 mg/dL  GLUCOSE, CAPILLARY      Result Value Range   Glucose-Capillary 143 (*) 70 - 99 mg/dL  BASIC METABOLIC PANEL      Result Value Range   Sodium 140  137 - 147 mEq/L   Potassium 5.0  3.7 - 5.3 mEq/L   Chloride 106  96 - 112 mEq/L   CO2 21  19 - 32 mEq/L   Glucose, Bld 218 (*) 70 - 99 mg/dL   BUN 49 (*) 6 - 23 mg/dL   Creatinine, Ser 2.22 (*) 0.50 - 1.10 mg/dL   Calcium 8.7  8.4 - 10.5 mg/dL   GFR calc non Af Amer 20 (*) >90 mL/min   GFR calc Af Amer 24 (*) >90 mL/min  CBC      Result Value Range   WBC 9.3  4.0 - 10.5 K/uL   RBC 3.15 (*) 3.87 - 5.11 MIL/uL   Hemoglobin 9.3 (*) 12.0 - 15.0  g/dL   HCT 29.4 (*) 36.0 - 46.0 %   MCV 93.3  78.0 - 100.0 fL   MCH 29.5  26.0 - 34.0 pg   MCHC 31.6  30.0 - 36.0 g/dL   RDW 13.7  11.5 - 15.5 %   Platelets 166  150 - 400 K/uL  GLUCOSE, CAPILLARY      Result Value Range   Glucose-Capillary 161 (*) 70 - 99 mg/dL  GLUCOSE, CAPILLARY      Result Value Range   Glucose-Capillary 289 (*) 70 - 99 mg/dL  GLUCOSE, CAPILLARY      Result Value Range   Glucose-Capillary 261 (*) 70 - 99 mg/dL  GLUCOSE, CAPILLARY      Result Value Range   Glucose-Capillary 273 (*) 70 - 99 mg/dL  GLUCOSE, CAPILLARY      Result Value Range   Glucose-Capillary 226 (*) 70 - 99 mg/dL  GLUCOSE, CAPILLARY      Result Value Range   Glucose-Capillary 190 (*) 70 - 99 mg/dL  GLUCOSE, CAPILLARY      Result Value Range   Glucose-Capillary 169 (*) 70 - 99 mg/dL  GLUCOSE, CAPILLARY      Result Value Range   Glucose-Capillary 166 (*) 70 - 99 mg/dL   Comment 1 Documented in Chart    GLUCOSE, CAPILLARY      Result Value Range   Glucose-Capillary 204 (*) 70 - 99 mg/dL   Comment 1 Documented in Chart    GLUCOSE, CAPILLARY      Result Value Range   Glucose-Capillary 211 (*) 70 - 99 mg/dL   Comment 1 Documented in Chart    SODIUM, URINE, RANDOM      Result Value Range   Sodium, Ur 54    CREATININE, URINE, RANDOM      Result Value Range   Creatinine, Urine 67.98    GLUCOSE, CAPILLARY      Result Value Range   Glucose-Capillary 194 (*) 70 - 99 mg/dL   Comment 1 Notify RN    GLUCOSE, CAPILLARY      Result Value Range   Glucose-Capillary 169 (*) 70 - 99 mg/dL  GLUCOSE, CAPILLARY      Result Value Range  Glucose-Capillary 123 (*) 70 - 99 mg/dL  GLUCOSE, CAPILLARY      Result Value Range   Glucose-Capillary 164 (*) 70 - 99 mg/dL  BASIC METABOLIC PANEL      Result Value Range   Sodium 144  137 - 147 mEq/L   Potassium 4.9  3.7 - 5.3 mEq/L   Chloride 108  96 - 112 mEq/L   CO2 20  19 - 32 mEq/L   Glucose, Bld 134 (*) 70 - 99 mg/dL   BUN 43 (*) 6 - 23 mg/dL    Creatinine, Ser 1.91 (*) 0.50 - 1.10 mg/dL   Calcium 9.5  8.4 - 10.5 mg/dL   GFR calc non Af Amer 24 (*) >90 mL/min   GFR calc Af Amer 28 (*) >90 mL/min  GLUCOSE, CAPILLARY      Result Value Range   Glucose-Capillary 96  70 - 99 mg/dL  GLUCOSE, CAPILLARY      Result Value Range   Glucose-Capillary 166 (*) 70 - 99 mg/dL   Comment 1 Documented in Chart     Comment 2 Notify RN     US Renal  06/21/2013   CLINICAL DATA:  Acute renal failure. Current history of diabetes and hypertension.  EXAM: RENAL/URINARY TRACT ULTRASOUND COMPLETE  COMPARISON:  None.  FINDINGS: Right Kidney:  Length: Approximately 10.1 cm. No hydronephrosis. Well-preserved cortex. No shadowing calculi. Normal parenchymal echotexture without focal abnormalities.  Left Kidney:  Length: Approximately 9.7 cm. No hydronephrosis. Well-preserved cortex. No shadowing calculi. Normal parenchymal echotexture without focal abnormalities.  Bladder:  Echogenic debris in the dependent portion of the bladder. Bladder otherwise normal in appearance.  IMPRESSION: 1. Normal-appearing kidneys without evidence of hydronephrosis. 2. Layering debris in the dependent portion of the bladder which may represent blood or infectious debris. Please correlate with urinalysis.   Electronically Signed   By: Evangeline Dakin M.D.   On: 06/21/2013 15:39     EKG Interpretation    Date/Time:  Thursday June 20 2013 00:21:21 EST Ventricular Rate:  53 PR Interval:  247 QRS Duration: 143 QT Interval:  474 QTC Calculation: 445 R Axis:   -12 Text Interpretation:  Sinus rhythm Prolonged PR interval Right bundle branch block No significant change since last tracing Confirmed by Ricquel Foulk  MD, Mikaiah Stoffer ([id number]) on 06/20/2013 1:08:59 AM            MDM  Iv ns. Labs.   Reviewed nursing notes and prior charts for additional history.   Meal/po fluids.  uti on labs. u cx. Rocephin iv.   Also w AKI on labs.  Med service called to admit re altered mental status,  acute kidney injury, urinary tract infection and hypoglycemia.  Recheck pt alert, content. bp normal.     Mirna Mires, MD 06/23/13 [phone]

## 2013-06-20 NOTE — Progress Notes (Signed)
Patient has been active with [facility]. Referral was received from patient's Primary Care MD , Dr Mayra Neer because of concerns regarding DM, COPD, HTN, HF  management at the assisted living facility, [facility]. [facility] Coordinator has been in contact with patient's son as well, Jelena Malicoat, [phone] who had previously expressed concerns around patient needing a higher level of care, such as SNF. Will pass this information along to the inpatient RNCM and LCSW.  Marthenia Rolling, MSN, RN,BSN- [facility] [phone]

## 2013-06-20 NOTE — Progress Notes (Signed)
[facility] PROGRESS NOTE  [name] UXN:[id number] DOB: 1934-12-12 DOA: 06/19/2013 PCP: Mayra Neer, MD bRIEF hpi:  [name] is a 79 y.o. female history of diabetes mellitus, hypertension, chronic kidney disease, COPD, peripheral arterial disease was brought to the ER the patient was found confused. At her living facility patient was found to have a CBG of 40 and D50 was given a fall in which she became more alert and awake and was brought to the ER. In the ER patient is alert and awake and nonfocal and blood sugars still around 60s. Patient's lab work shows worsening of her creatinine from 1.8-2.5 and UA showed features concerning for UTI. Patient has been started on D5 normal and ceftriaxone and admitted for further workup   Assessment/Plan:  1. Acute encephalopathy most likely secondary to hypoglycemia in a patient with diabetes mellitus type 2 - which has essentially resolved at this time. At this time I'm holding off patient's Lantus and schedule insulin dose. Patient became hypoglycemic probably secondary to poor oral intake with worsening renal function. At this time I will continue with D5 normal saline at 50 cc per hour and check CBGs every 2 hours. Once patient actually improves and patient eats patient will need at least low-dose Lantus soon. 2. Acute on chronic renal failure - probably secondary to poor oral intake. I'm holding off patient's Cozaar and Lasix for now. Gently hydrate. Closely follow intake output and metabolic panel. Check urine sodium and creatinine. 3. UTI - patient is on ceftriaxone. Follow urine cultures. 4. Hypertension - due to acute renal failure patient's Lasix and Cozaar is on hold. I will place patient on IV hydralazine for systolic blood pressure more than 160. 5. COPD - presently not wheezing. Continue inhalers. 6. Hypothyroidism - continue Synthroid. Check TSH. 7. Peripheral vascular disease - continue antiplatelet agents.   Code Status:  presumed full code Family Communication: none at bedside Disposition Plan: pending PT eval.    Consultants: NONE Procedures:  NONE  Antibiotics:  Rocephin 2/5  HPI/Subjective: Reports that she is in [place] [facility], did nt know what happened.    Objective: Filed Vitals:   06/20/13 0900  BP: 145/77  Pulse: 63  Temp: 98.4 F (36.9 C)  Resp: 17   No intake or output data in the 24 hours ending 06/20/13 1113 There were no vitals filed for this visit.  Exam:   General:  Alert afebrile comfortable  Cardiovascular: s1s2  Respiratory: ctab  Abdomen: soft TN ND BS+  Musculoskeletal: NO PEDAL EDEMA.   Data Reviewed: Basic Metabolic Panel:  Recent Labs Lab 06/20/13 0044  NA 144  K 4.8  CL 108  CO2 24  GLUCOSE 95  BUN 56*  CREATININE 2.51*  CALCIUM 9.1   Liver Function Tests:  Recent Labs Lab 06/20/13 0044  AST 15  ALT 8  ALKPHOS 48  BILITOT 0.3  PROT 6.9  ALBUMIN 3.3*   No results found for this basename: LIPASE, AMYLASE,  in the last 168 hours No results found for this basename: AMMONIA,  in the last 168 hours CBC:  Recent Labs Lab 06/20/13 0044  WBC 10.7*  HGB 10.6*  HCT 33.9*  MCV 92.9  PLT 188   Cardiac Enzymes:  Recent Labs Lab 06/20/13 0044  TROPONINI <0.30   BNP (last 3 results) No results found for this basename: PROBNP,  in the last 8760 hours CBG:  Recent Labs Lab 06/20/13 0013 06/20/13 0111 06/20/13 0557 06/20/13 0754 06/20/13 Chase  106* 92 74 74 124*    No results found for this or any previous visit (from the past 240 hour(s)).   Studies: No results found.  Scheduled Meds: . amitriptyline  50 mg Oral QHS  . amLODipine  10 mg Oral q morning - 10a  . aspirin  81 mg Oral q morning - 10a  . atorvastatin  40 mg Oral q1800  . carvedilol  25 mg Oral BID WC  . cefTRIAXone (ROCEPHIN)  IV  1 g Intravenous Q24H  . cloNIDine  0.2 mg Oral BID  . enoxaparin (LOVENOX) injection  40 mg Subcutaneous Q24H  .  gabapentin  100 mg Oral Custom  . gabapentin  300 mg Oral Q24H  . levothyroxine  50 mcg Oral QAC breakfast  . mometasone-formoterol  2 puff Inhalation BID  . multivitamin  1 tablet Oral Daily  . sertraline  50 mg Oral Daily   Continuous Infusions: . dextrose 5 % and 0.9% NaCl 50 mL/hr at 06/20/13 2725    Principal Problem:   Acute encephalopathy Active Problems:   HYPOTHYROIDISM   DM   Hypoglycemia   Renal failure (ARF), acute on chronic   UTI (lower urinary tract infection)    Time spent: 35 min    Jourden Delmont  [facility] Pager [phone]. If 7PM-7AM, please contact night-coverage at [email or web address], password [facility] 06/20/2013, 11:13 AM  LOS: 1 day

## 2013-06-20 NOTE — Evaluation (Signed)
Physical Therapy Evaluation Patient Details Name: [name] MRN: [id number] DOB: 05/28/1934 Today's Date: 06/20/2013 Time: 4098-1191 PT Time Calculation (min): 31 min  PT Assessment / Plan / Recommendation History of Present Illness  [name] is a 79 y.o. female history of diabetes mellitus, hypertension, chronic kidney disease, COPD, peripheral arterial disease was brought to the ER the patient was found confused. At her living facility patient was found to have a CBG of 40.  Patient's lab work shows worsening of her creatinine from 1.8-2.5 and UA showed features concerning for UTI.  Clinical Impression  Patient presents with decreased independence with mobility due to deficits listed below.  She will benefit from skilled PT to address deficits and allow return to ALF with HHPT to follow.  May need increased level of assist, she feels sure they can provide.    PT Assessment  Patient needs continued PT services    Follow Up Recommendations  Home health PT;Supervision/Assistance - 24 hour    Does the patient have the potential to tolerate intense rehabilitation    N/A  Barriers to Discharge  None      Equipment Recommendations  None recommended by PT    Recommendations for Other Services     Frequency Min 3X/week    Precautions / Restrictions Precautions Precautions: Fall   Pertinent Vitals/Pain C/o generalized pain with movement, also left heel pain prior to activity (RN aware)      Mobility  Bed Mobility Overal bed mobility: Needs Assistance Bed Mobility: Rolling;Sidelying to Sit Rolling: Mod assist Sidelying to sit: Max assist General bed mobility comments: pt incontinent of urine in the bed; assisted to roll with rail and then side to sit Transfers Overall transfer level: Needs assistance Transfers: Sit to/from [employer] Transfers Sit to Stand: Mod assist Squat pivot transfers: Mod assist General transfer comment: difficulty coming to stand,  pivot with assist bed to recliner; unable to take steps     Exercises     PT Diagnosis: Generalized weakness  PT Problem List: Decreased strength;Decreased activity tolerance;Decreased balance;Decreased mobility;Decreased safety awareness PT Treatment Interventions: DME instruction;Therapeutic exercise;Balance training;Functional mobility training;Therapeutic activities;Patient/family education;Wheelchair mobility training     PT Goals(Current goals can be found in the care plan section) Acute Rehab PT Goals Patient Stated Goal: To return to independent with transfers PT Goal Formulation: With patient Time For Goal Achievement: 07/04/13 Potential to Achieve Goals: Good  Visit Information  Last PT Received On: 06/20/13 Assistance Needed: +2 History of Present Illness: [name] is a 79 y.o. female history of diabetes mellitus, hypertension, chronic kidney disease, COPD, peripheral arterial disease was brought to the ER the patient was found confused. At her living facility patient was found to have a CBG of 40.  Patient's lab work shows worsening of her creatinine from 1.8-2.5 and UA showed features concerning for UTI.       Prior Vina expects to be discharged to:: Assisted living Additional Comments: fro [facility] ALF Prior Function Level of Independence: Needs assistance ADL's / Homemaking Assistance Needed: staff assist her in shower and she washes herself Communication Communication: No difficulties Dominant Hand: Right    Cognition  Cognition Arousal/Alertness: Awake/alert Behavior During Therapy: WFL for tasks assessed/performed Overall Cognitive Status: Within Functional Limits for tasks assessed (however, pt reports she is "crazy" (confused) in the mornings)    Extremity/Trunk Assessment Upper Extremity Assessment Upper Extremity Assessment: Generalized weakness Lower Extremity Assessment Lower Extremity Assessment:  Generalized weakness  Balance Balance Overall balance assessment: Needs assistance Sitting-balance support: Bilateral upper extremity supported Sitting balance-Leahy Scale: Poor Sitting balance - Comments: sitting supervised she leans posterior and needs assist to come forward Postural control: Posterior lean Standing balance-Leahy Scale: Zero Standing balance comment: unable to stand long with one assist  End of Session PT - End of Session Equipment Utilized During Treatment: Gait belt Activity Tolerance: Patient limited by fatigue Patient left: in chair;with call bell/phone within reach;with nursing/sitter in room Nurse Communication: Mobility status  GP     [facility] 06/20/2013, 4:54 PM Magda Kiel, [place] 06/20/2013

## 2013-06-20 NOTE — Progress Notes (Signed)
Utilization review completed. Alaria Oconnor, RN, BSN. 

## 2013-06-21 ENCOUNTER — Inpatient Hospital Stay (HOSPITAL_COMMUNITY): Payer: Medicare Other

## 2013-06-21 DIAGNOSIS — N390 Urinary tract infection, site not specified: Secondary | ICD-10-CM

## 2013-06-21 LAB — CBC
HEMATOCRIT: 29.4 % — AB (ref 36.0–46.0)
Hemoglobin: 9.3 g/dL — ABNORMAL LOW (ref 12.0–15.0)
MCH: 29.5 pg (ref 26.0–34.0)
MCHC: 31.6 g/dL (ref 30.0–36.0)
MCV: 93.3 fL (ref 78.0–100.0)
Platelets: 166 10*3/uL (ref 150–400)
RBC: 3.15 MIL/uL — AB (ref 3.87–5.11)
RDW: 13.7 % (ref 11.5–15.5)
WBC: 9.3 10*3/uL (ref 4.0–10.5)

## 2013-06-21 LAB — GLUCOSE, CAPILLARY
GLUCOSE-CAPILLARY: 190 mg/dL — AB (ref 70–99)
GLUCOSE-CAPILLARY: 211 mg/dL — AB (ref 70–99)
GLUCOSE-CAPILLARY: 194 mg/dL — AB (ref 70–99)
Glucose-Capillary: 166 mg/dL — ABNORMAL HIGH (ref 70–99)
Glucose-Capillary: 169 mg/dL — ABNORMAL HIGH (ref 70–99)
Glucose-Capillary: 169 mg/dL — ABNORMAL HIGH (ref 70–99)
Glucose-Capillary: 204 mg/dL — ABNORMAL HIGH (ref 70–99)
Glucose-Capillary: 226 mg/dL — ABNORMAL HIGH (ref 70–99)
Glucose-Capillary: 273 mg/dL — ABNORMAL HIGH (ref 70–99)

## 2013-06-21 LAB — CREATININE, URINE, RANDOM: Creatinine, Urine: 67.98 mg/dL

## 2013-06-21 LAB — BASIC METABOLIC PANEL
BUN: 49 mg/dL — ABNORMAL HIGH (ref 6–23)
CHLORIDE: 106 meq/L (ref 96–112)
CO2: 21 mEq/L (ref 19–32)
CREATININE: 2.22 mg/dL — AB (ref 0.50–1.10)
Calcium: 8.7 mg/dL (ref 8.4–10.5)
GFR calc non Af Amer: 20 mL/min — ABNORMAL LOW (ref >90)
GFR, EST AFRICAN AMERICAN: 24 mL/min — AB (ref >90)
Glucose, Bld: 218 mg/dL — ABNORMAL HIGH (ref 70–99)
Potassium: 5 mEq/L (ref 3.7–5.3)
SODIUM: 140 meq/L (ref 137–147)

## 2013-06-21 LAB — SODIUM, URINE, RANDOM: SODIUM UR: 54 meq/L

## 2013-06-21 MED ORDER — INSULIN GLARGINE 100 UNIT/ML ~~LOC~~ SOLN
15.0000 [IU] | SUBCUTANEOUS | Status: DC
  Administered 2013-06-22: 15 [IU] via SUBCUTANEOUS
  Filled 2013-06-21: qty 0.15

## 2013-06-21 MED ORDER — ENOXAPARIN SODIUM 30 MG/0.3ML ~~LOC~~ SOLN
30.0000 mg | SUBCUTANEOUS | Status: DC
  Administered 2013-06-21 – 2013-06-24 (×4): 30 mg via SUBCUTANEOUS
  Filled 2013-06-21 (×4): qty 0.3

## 2013-06-21 MED ORDER — INSULIN ASPART 100 UNIT/ML ~~LOC~~ SOLN
0.0000 [IU] | Freq: Three times a day (TID) | SUBCUTANEOUS | Status: DC
  Administered 2013-06-21 – 2013-06-22 (×2): 2 [IU] via SUBCUTANEOUS
  Administered 2013-06-22 – 2013-06-23 (×2): 1 [IU] via SUBCUTANEOUS
  Administered 2013-06-23: 3 [IU] via SUBCUTANEOUS
  Administered 2013-06-23: 2 [IU] via SUBCUTANEOUS
  Administered 2013-06-24: 3 [IU] via SUBCUTANEOUS
  Administered 2013-06-24 (×2): 1 [IU] via SUBCUTANEOUS

## 2013-06-21 MED ORDER — SODIUM CHLORIDE 0.9 % IV SOLN
INTRAVENOUS | Status: DC
  Administered 2013-06-21: 15:00:00 via INTRAVENOUS

## 2013-06-21 NOTE — Clinical Social Work Psychosocial (Signed)
Clinical Social Work Department BRIEF PSYCHOSOCIAL ASSESSMENT 06/21/2013  Patient:  [name], [name]     Account Number:  [id number]     Admit date:  06/19/2013  Clinical Social Worker:  Frederico Hamman  Date/Time:  06/21/2013 04:03 AM  Referred by:  Physician  Date Referred:  06/20/2013 Referred for  SNF Placement   Other Referral:   Interview type:  Family Other interview type:   CSW talked with patient's sons    PSYCHOSOCIAL DATA Living Status:  FACILITY Admitted from facility:   Level of care:  Assisted Living Primary support name:  [name] Primary support relationship to patient:  CHILD, ADULT Degree of support available:   Patient is from [facility]. Sons [name]. [phone]) and [name] [phone]) are concerned about patient being at a facility where she will receive the appropriate level of care. There is also a daughter, [name].    CURRENT CONCERNS Current Concerns  Post-Acute Placement   Other Concerns:    SOCIAL WORK ASSESSMENT / PLAN CSW talked with patient's sons [name]. and [name] (at different times) regarding discharge plans. CSW had been advised on 2/5 that family was concerned that patient was receiving proper care at her ALF. CSW initially talked with [name] and then [name] by phone.  CSW advised by [name]. that he talked with his mother and she is afraid at [name] and told him they are not taking  care of her. Both sons are in favor of SNF placement and will talk with the other siblings regarding the change in care for patient.    CSW explained SNF search process to both sons and advised that the main contact person - Sally Goodroe., will be called and provided with facility responses.   Assessment/plan status:  Psychosocial Support/Ongoing Assessment of Needs Other assessment/ plan:   Information/referral to community resources:   CSW emailed SNF list to [name] at [email or web address]    PATIENT'S/FAMILY'S RESPONSE TO PLAN OF CARE: Both sons were very receptive to talking with CSW. Mr. [name]., contacted his brother [name] and asked him to call CSW to further discuss discharge plans.

## 2013-06-21 NOTE — Clinical Social Work Placement (Addendum)
Clinical Social Work Department CLINICAL SOCIAL WORK PLACEMENT NOTE 06/21/2013  06/25/13 - LATE ENTRY DISCHARGE NOTE - SEE BELOW  Patient:  [name], [name]  Account Number:  [id number] Admit date:  06/19/2013  Clinical Social Worker:  Blayden Conwell Givens, LCSW  Date/time:  06/21/2013 04:18 AM  Clinical Social Work is seeking post-discharge placement for this patient at the following level of care:   SKILLED NURSING   (*CSW will update this form in Epic as items are completed)   06/21/2013  Patient/family provided with [place] Department of Clinical Social Work's list of facilities offering this level of care within the geographic area requested by the patient (or if unable, by the patient's family).  06/21/2013  Patient/family informed of their freedom to choose among providers that offer the needed level of care, that participate in Medicare, Medicaid or managed care program needed by the patient, have an available bed and are willing to accept the patient.    Patient/family informed of [facility]' ownership interest in [facility], as well as of the fact that they are under no obligation to receive care at this facility.  PASARR submitted to EDS on 02/05/2007 PASARR number received from [employer] on 02/05/2007  FL2 transmitted to all facilities in geographic area requested by pt/family on  06/21/2013 FL2 transmitted to all facilities within larger geographic area on   Patient informed that his/her managed care company has contracts with or will negotiate with  certain facilities, including the following:     Patient/family informed of bed offers received:  06/22/2013 Patient discharges back to [facility] on 06/24/13 Physician recommends and patient chooses bed at    Patient to be transferred to [facility] on 06/24/13   Patient to be transferred to facility by ambulance  The following physician request were entered in Epic:  Additional  Comments: 06/24/13: Family decided for patient to return to ALF as their preferred skilled nursing facility - [facility] did not have a bed on 2/9.

## 2013-06-21 NOTE — Progress Notes (Signed)
[facility] PROGRESS NOTE  [name] EXB:[id number] DOB: 1935/02/20 DOA: 06/19/2013 PCP: Mayra Neer, MD bRIEF hpi:  [name] is a 79 y.o. female history of diabetes mellitus, hypertension, chronic kidney disease, COPD, peripheral arterial disease was brought to the ER the patient was found confused. At her living facility patient was found to have a CBG of 40 and D50 was given a fall in which she became more alert and awake and was brought to the ER. In the ER patient is alert and awake and nonfocal and blood sugars still around 60s. Patient's lab work shows worsening of her creatinine from 1.8-2.5 and UA showed features concerning for UTI. Patient has been started on D5 normal and ceftriaxone and admitted for further workup   Assessment/Plan:  1. Acute encephalopathy most likely secondary to hypoglycemia in a patient with diabetes mellitus type 2 - which has essentially resolved at this time. . Patient became hypoglycemic probably secondary to poor oral intake with worsening renal function.She was started on dextrose fluids and lantus held. Her cbg's improved. Her dextrose fluids will be stopped and she will be started on 15 units of lantus tonight with SSI. At home she was on 60 units of lantus. Her hgbaa1c is 6.9% 2. Acute on chronic renal failure stage 3. - probably secondary to poor oral intake. We held patient's Cozaar and Lasix for now. Gently hydrate. Closely follow intake output and metabolic panel. US renal ordered and renal electrolytes ordered and pending.  3. UTI - patient is on ceftriaxone. Follow urine cultures. 4. Hypertension - due to acute renal failure patient's Lasix and Cozaar is on hold. I will place patient on IV hydralazine for systolic blood pressure more than 160. 5. COPD - presently not wheezing. Continue inhalers. 6. Hypothyroidism - continue Synthroid. Check TSH. 7. Peripheral vascular disease - continue antiplatelet agents. 8. Left foot wound; wound  consulted and follow recommendations.  9. DVT prophylaxis.    Code Status: presumed full code Family Communication: none at bedside Disposition Plan: pending PT eval.    Consultants: NONE Procedures:  NONE  Antibiotics:  Rocephin 2/5  HPI/Subjective: Comfortable. Pain controlled.    Objective: Filed Vitals:   06/21/13 1300  BP: 135/63  Pulse: 52  Temp: 99.1 F (37.3 C)  Resp: 18    Intake/Output Summary (Last 24 hours) at 06/21/13 1419 Last data filed at 06/21/13 1018  Gross per 24 hour  Intake 1838.33 ml  Output      0 ml  Net 1838.33 ml   Filed Weights   06/20/13 2003  Weight: 90.674 kg (199 lb 14.4 oz)    Exam:   General:  Alert afebrile comfortable  Cardiovascular: s1s2  Respiratory: ctab  Abdomen: soft TN ND BS+  Musculoskeletal: NO PEDAL EDEMA. Left foot wound, no drainage.  Data Reviewed: Basic Metabolic Panel:  Recent Labs Lab 06/20/13 0044 06/20/13 1010 06/21/13 0345  NA 144 146 140  K 4.8 4.6 5.0  CL 108 109 106  CO2 24 24 21   GLUCOSE 95 114* 218*  BUN 56* 50* 49*  CREATININE 2.51* 2.16* 2.22*  CALCIUM 9.1 9.3 8.7   Liver Function Tests:  Recent Labs Lab 06/20/13 0044 06/20/13 1010  AST 15 14  ALT 8 8  ALKPHOS 48 51  BILITOT 0.3 0.2*  PROT 6.9 6.7  ALBUMIN 3.3* 3.2*   No results found for this basename: LIPASE, AMYLASE,  in the last 168 hours No results found for this basename: AMMONIA,  in the  last 168 hours CBC:  Recent Labs Lab 06/20/13 0044 06/20/13 1010 06/21/13 0345  WBC 10.7* 10.9* 9.3  NEUTROABS  --  6.6  --   HGB 10.6* 10.4* 9.3*  HCT 33.9* 32.7* 29.4*  MCV 92.9 92.6 93.3  PLT 188 182 166   Cardiac Enzymes:  Recent Labs Lab 06/20/13 0044  TROPONINI <0.30   BNP (last 3 results) No results found for this basename: PROBNP,  in the last 8760 hours CBG:  Recent Labs Lab 06/21/13 0414 06/21/13 0604 06/21/13 0731 06/21/13 1041 06/21/13 1349  GLUCAP 190* 169* 166* 204* 211*     Recent Results (from the past 240 hour(s))  URINE CULTURE     Status: None   Collection Time    06/20/13  2:58 AM      Result Value Range Status   Specimen Description URINE, CATHETERIZED   Final   Special Requests NONE   Final   Culture  Setup Time     Final   Value: 06/20/2013 08:38     Performed at [place]     Final   Value: >=100,000 COLONIES/ML     Performed at [facility]   Culture     Final   Value: ENTEROCOCCUS SPECIES     Performed at [facility]   Report Status PENDING   Incomplete  MRSA PCR SCREENING     Status: None   Collection Time    06/20/13 10:21 AM      Result Value Range Status   MRSA by PCR NEGATIVE  NEGATIVE Final   Comment:            The GeneXpert MRSA Assay (FDA     approved for NASAL specimens     only), is one component of a     comprehensive MRSA colonization     surveillance program. It is not     intended to diagnose MRSA     infection nor to guide or     monitor treatment for     MRSA infections.     Studies: No results found.  Scheduled Meds: . amitriptyline  50 mg Oral QHS  . amLODipine  10 mg Oral q morning - 10a  . aspirin  81 mg Oral q morning - 10a  . atorvastatin  40 mg Oral q1800  . carvedilol  25 mg Oral BID WC  . cefTRIAXone (ROCEPHIN)  IV  1 g Intravenous Q24H  . cloNIDine  0.2 mg Oral BID  . enoxaparin (LOVENOX) injection  30 mg Subcutaneous Q24H  . gabapentin  100 mg Oral Custom  . gabapentin  300 mg Oral Q24H  . insulin aspart  0-9 Units Subcutaneous TID WC  . [START ON 06/22/2013] insulin glargine  15 Units Subcutaneous BH-q7a  . levothyroxine  50 mcg Oral QAC breakfast  . mometasone-formoterol  2 puff Inhalation BID  . multivitamin  1 tablet Oral Daily  . sertraline  50 mg Oral Daily   Continuous Infusions:    Principal Problem:   Acute encephalopathy Active Problems:   HYPOTHYROIDISM   DM   Hypoglycemia   Renal failure (ARF), acute on chronic   UTI (lower  urinary tract infection)    Time spent: 35 min    Renarda Mullinix  [facility] Pager [phone]. If 7PM-7AM, please contact night-coverage at [email or web address], password [facility] 06/21/2013, 2:19 PM  LOS: 2 days

## 2013-06-21 NOTE — Progress Notes (Signed)
Inpatient Diabetes Program Recommendations  AACE/ADA: New Consensus Statement on Inpatient Glycemic Control (2013)  Target Ranges:  Prepandial:   less than 140 mg/dL      Peak postprandial:   less than 180 mg/dL (1-2 hours)      Critically ill patients:  140 - 180 mg/dL   Reason for Visit: Results for AUDREE, SCHRECENGOST (MRN [id number]) as of 06/21/2013 10:54  Ref. Range 06/21/2013 02:06 06/21/2013 04:14 06/21/2013 06:04 06/21/2013 07:31 06/21/2013 10:41  Glucose-Capillary Latest Range: 70-99 mg/dL 226 (H) 190 (H) 169 (H) 166 (H) 204 (H)    Diabetes history: Type 2 Diabetes, Note patient admitted with hypoglycemia Outpatient Diabetes medications: Lantus 60 units q AM, Humalog 15 units tid with meals  Current orders for Inpatient glycemic control: Currently all insulins on hold, Note elevated BUN and creatinine.   Please consider restarting Sensitive Novolog correction tid with meals.  May consider restarting Lantus at a reduced dose when appropriate (0.2 units/kg).      Thanks, Adah Perl, RN, BC-ADM Inpatient Diabetes Coordinator Pager [phone]

## 2013-06-21 NOTE — Consult Note (Signed)
WOC wound consult note Reason for Consult: evaluation of the left lateral foot wound. It is unclear how long this has been present, but she reports she did not know it was there and one day she took her shoe off and found the area. It is painful to her but not when I assessed or touched it today did it seem painful. She reports pain in her left knee also.  She has been evaluated by the wound care center also, and she reports "scraping" which sounds like a serial debridement. Bilateral LE with palpable pulses, no significant edema.  When I assessed her plantar surface with sterile applicator with her eyes closed she did consistently indicate she felt my touch, her sensation seems  grossly intact. However with a monofilament she would more likely have some areas of lack of sensation. Wound type: Neuropathic foot ulcer Measurement:0.5cm x 0.5cm x 0.2 Wound YYQ:[id number] area, not open but darkened, non necrotic Drainage (amount, consistency, odor) none Periwound:intact, no s/s of infection Dressing procedure/placement/frequency: cover with silicone foam for protection, no other topical care recommended at this time. She reports "diabetic shoes" but she may need to be evaluated for other type of shoe if the shoe is rubbing this area. Keep followup with wound care center as scheduled.  Discussed POC with patient and bedside nurse.  Re consult if needed, will not follow at this time. Thanks  Padraig Nhan Kellogg, [place] [phone])

## 2013-06-21 NOTE — Evaluation (Signed)
Occupational Therapy Evaluation Patient Details Name: [name] MRN: [id number] DOB: 01/29/1935 Today's Date: 06/21/2013 Time: 0175-1025 OT Time Calculation (min): 33 min  OT Assessment / Plan / Recommendation History of present illness [name] is a 79 y.o. female history of diabetes mellitus, hypertension, chronic kidney disease, COPD, peripheral arterial disease was brought to the ER the patient was found confused. At her living facility patient was found to have a CBG of 40.  Patient's lab work shows worsening of her creatinine from 1.8-2.5 and UA showed features concerning for UTI.   Clinical Impression   Pt demos decline in function with ADLs and ADL mobility safety with decreased strength, balance and endurance. Pt would benefit from acute OT services to address impairments to increase level of function and safety. Pt may need short term SNF for rehab depending on acute acre progress    OT Assessment  Patient needs continued OT Services     Follow Up Recommendations  Home health OT;Supervision/Assistance - 24 hour;SNF    Barriers to Discharge  Pt states that she is unsure if ALF can proivded current level of care that is required. Pt feels she may need short term SNF for rehab before return to ALF  Equipment Recommendations  None recommended by OT    Recommendations for Other Services    Frequency  Min 2X/week    Precautions / Restrictions Precautions Precautions: Fall Restrictions Weight Bearing Restrictions: No   Pertinent Vitals/Pain No c/o pain    ADL  Grooming: Performed;Wash/dry hands;Wash/dry face;Min guard;Other (comment) (min A for balance/support) Where Assessed - Grooming: Supported sitting Upper Body Bathing: Simulated;Other (comment);Minimal assistance (min A for balance/support) Where Assessed - Upper Body Bathing: Supported sitting Lower Body Bathing: Simulated;Maximal assistance Upper Body Dressing: Performed;Minimal assistance (min A for  balance/support) Where Assessed - Upper Body Dressing: Supported sitting Lower Body Dressing: +1 Total assistance Toilet Transfer: Simulated;Moderate assistance Toilet Transfer Method: Sit to stand Toileting - Clothing Manipulation and Hygiene: Simulated;+1 Total assistance Where Assessed - Toileting Clothing Manipulation and Hygiene: Rolling right and/or left Tub/Shower Transfer Method: Not assessed Equipment Used: Gait belt Transfers/Ambulation Related to ADLs: difficulty coming to stand, unable to pivot for transfer to [facility]. Pt was up with nurisng earlier and still fatigued ADL Comments: Pt required mod assist with LB ADLs at ALF. Min  A for balance/support seated EOB during UB ADLs    OT Diagnosis: Generalized weakness  OT Problem List: Decreased strength;Decreased knowledge of use of DME or AE;Decreased activity tolerance;Impaired balance (sitting and/or standing) OT Treatment Interventions: Self-care/ADL training;Therapeutic exercise;Patient/family education;Neuromuscular education;Balance training;Therapeutic activities;DME and/or AE instruction   OT Goals(Current goals can be found in the care plan section) Acute Rehab OT Goals Patient Stated Goal: to get strength back and transfer independently again OT Goal Formulation: With patient/family Time For Goal Achievement: 06/28/13 Potential to Achieve Goals: Good ADL Goals Pt Will Perform Grooming: with set-up;with supervision;sitting Pt Will Perform Upper Body Bathing: with set-up;with supervision;sitting;with min guard assist Pt Will Perform Lower Body Bathing: with mod assist;sitting/lateral leans Pt Will Perform Upper Body Dressing: with min guard assist;with supervision;with set-up;sitting Pt Will Transfer to Toilet: with mod assist;with min assist;bedside commode Additional ADL Goal #1: Pt will complete bed mobility with min A to sit EOB in prep for ADLs  Visit Information  Last OT Received On: 06/21/13 History of Present  Illness: [name] is a 79 y.o. female history of diabetes mellitus, hypertension, chronic kidney disease, COPD, peripheral arterial disease was brought to  the ER the patient was found confused. At her living facility patient was found to have a CBG of 40.  Patient's lab work shows worsening of her creatinine from 1.8-2.5 and UA showed features concerning for UTI.       Prior [place] expects to be discharged to:: Assisted living Home Equipment: Shower seat;Walker - 2 wheels;Wheelchair - manual;Hospital bed Additional Comments: from [facility] ALF Prior Function Level of Independence: Needs assistance Gait / Transfers Assistance Needed: pt was able to transfer herself bed - w/c - toilet ADL's / Homemaking Assistance Needed: staff assist her in shower with bathing/dressing LB and she washes herself Communication Communication: No difficulties Dominant Hand: Right         Vision/Perception Vision - History Baseline Vision: Wears glasses all the time Patient Visual Report: No change from baseline Perception Perception: Within Functional Limits   Cognition  Cognition Arousal/Alertness: Awake/alert Behavior During Therapy: WFL for tasks assessed/performed Overall Cognitive Status: Within Functional Limits for tasks assessed    Extremity/Trunk Assessment Upper Extremity Assessment Upper Extremity Assessment: Overall WFL for tasks assessed;Generalized weakness Lower Extremity Assessment Lower Extremity Assessment: Defer to PT evaluation     Mobility Bed Mobility Overal bed mobility: Needs Assistance Bed Mobility: Rolling;Sidelying to Sit Rolling: Mod assist Sidelying to sit: Max assist General bed mobility comments: pt incontinent of urine in the bed; assisted to roll with rail and then side to sit Transfers Overall transfer level: Needs assistance Transfers: Sit to/from Stand Sit to Stand: Mod assist;Max assist General transfer  comment: difficulty coming to stand, unable to pivot for transfer to [facility]. Pt was up with nurisng earlier and still fatigued          Balance Balance Overall balance assessment: Needs assistance Sitting-balance support: Bilateral upper extremity supported;Feet supported Sitting balance-Leahy Scale: Poor Sitting balance - Comments: sitting supervised she leans posterior and to left, needs assist to come forward and to midline Postural control: Left lateral lean;Posterior lean Standing balance-Leahy Scale: Poor   End of Session OT - End of Session Equipment Utilized During Treatment: Gait belt Activity Tolerance: Patient limited by fatigue Patient left: in bed;with call bell/phone within reach;with bed alarm set;with family/visitor present  GO     Britt Bottom 06/21/2013, 1:14 PM

## 2013-06-22 DIAGNOSIS — R4182 Altered mental status, unspecified: Secondary | ICD-10-CM

## 2013-06-22 DIAGNOSIS — I10 Essential (primary) hypertension: Secondary | ICD-10-CM

## 2013-06-22 DIAGNOSIS — D649 Anemia, unspecified: Secondary | ICD-10-CM

## 2013-06-22 DIAGNOSIS — E039 Hypothyroidism, unspecified: Secondary | ICD-10-CM

## 2013-06-22 DIAGNOSIS — J441 Chronic obstructive pulmonary disease with (acute) exacerbation: Secondary | ICD-10-CM

## 2013-06-22 LAB — URINE CULTURE: Colony Count: >=100000

## 2013-06-22 LAB — GLUCOSE, CAPILLARY
Glucose-Capillary: 123 mg/dL — ABNORMAL HIGH (ref 70–99)
Glucose-Capillary: 164 mg/dL — ABNORMAL HIGH (ref 70–99)
Glucose-Capillary: 96 mg/dL (ref 70–99)
Glucose-Capillary: 166 mg/dL — ABNORMAL HIGH (ref 70–99)

## 2013-06-22 MED ORDER — LEVOFLOXACIN 750 MG PO TABS
750.0000 mg | ORAL_TABLET | Freq: Once | ORAL | Status: AC
  Administered 2013-06-22: 750 mg via ORAL
  Filled 2013-06-22: qty 1

## 2013-06-22 MED ORDER — INSULIN GLARGINE 100 UNIT/ML ~~LOC~~ SOLN
15.0000 [IU] | Freq: Every morning | SUBCUTANEOUS | Status: DC
  Administered 2013-06-23 – 2013-06-24 (×2): 15 [IU] via SUBCUTANEOUS
  Filled 2013-06-22 (×2): qty 0.15

## 2013-06-22 MED ORDER — INSULIN ASPART 100 UNIT/ML ~~LOC~~ SOLN
3.0000 [IU] | Freq: Three times a day (TID) | SUBCUTANEOUS | Status: DC
  Administered 2013-06-22 – 2013-06-24 (×7): 3 [IU] via SUBCUTANEOUS

## 2013-06-22 MED ORDER — LEVOFLOXACIN 500 MG PO TABS
500.0000 mg | ORAL_TABLET | ORAL | Status: DC
  Administered 2013-06-24: 500 mg via ORAL
  Filled 2013-06-22: qty 1

## 2013-06-22 NOTE — Progress Notes (Signed)
Patient ID: LEIANA RUND  female  IRJ:[id number]    DOB: 01/30/1935    DOA: 06/19/2013  PCP: Mayra Neer, MD  Assessment/Plan: Principal Problem:   Acute encephalopathy improved, likely due to hypoglycemia and UTI - most likely secondary to hypoglycemia in a patient with diabetes mellitus type 2  - Patient became hypoglycemic probably secondary to poor oral intake with worsening renal function. She was started on dextrose fluids and lantus was held. Her cbg's improved. Her dextrose fluids was stopped, started on 15 units of lantus with SSI. ( At home she was on 60 units of lantus, meal coverage 15units TID. Her hgbaa1c is 6.9%) - Placed on carb modified diet, add 3 units meal coverage   Active Problems:   HYPOTHYROIDISM - Continue Synthroid    DM - Change to carb modify diet, placed on Lantus, meal coverage with sliding scale insulin    Renal failure (ARF), acute on chronic - Continue to hold Cozaar, creatinine improving (2.5 -> 2.2) - Renal ultrasound shows no hydronephrosis   Enterococcus UTI (lower urinary tract infection) - Urine culture shows enterococcus, resistant to Rocephin, DC Rocephin - Placed on levofloxacin per sensitivities, likely 7 days  Left foot wound Dressing- cover with silicone foam for protection, no other topical care recommended at this time   DVT Prophylaxis:  Code Status: Full code  Family Communication:  Disposition: likely Monday    Subjective: denies any specific complaints  Afebrile, no pain or shortness of breath,   Objective: Weight change: 0.227 kg (8 oz)  Intake/Output Summary (Last 24 hours) at 06/22/13 1132 Last data filed at 06/22/13 0900  Gross per 24 hour  Intake   1200 ml  Output    150 ml  Net   1050 ml   Blood pressure 148/74, pulse 58, temperature 99.1 F (37.3 C), temperature source Axillary, resp. rate 20, height 5\' 3"  (1.6 m), weight 90.901 kg (200 lb 6.4 oz), SpO2 88.00%.  Physical Exam: General: Alert and  awake, oriented x3, not in any acute distress. CVS: S1-S2 clear, no murmur rubs or gallops Chest: clear to auscultation bilaterally, no wheezing, rales or rhonchi Abdomen: soft nontender, nondistended, normal bowel sounds  Extremities: no cyanosis, clubbing or edema noted bilaterally   Lab Results: Basic Metabolic Panel:  Recent Labs Lab 06/20/13 1010 06/21/13 0345  NA 146 140  K 4.6 5.0  CL 109 106  CO2 24 21  GLUCOSE 114* 218*  BUN 50* 49*  CREATININE 2.16* 2.22*  CALCIUM 9.3 8.7   Liver Function Tests:  Recent Labs Lab 06/20/13 0044 06/20/13 1010  AST 15 14  ALT 8 8  ALKPHOS 48 51  BILITOT 0.3 0.2*  PROT 6.9 6.7  ALBUMIN 3.3* 3.2*   No results found for this basename: LIPASE, AMYLASE,  in the last 168 hours No results found for this basename: AMMONIA,  in the last 168 hours CBC:  Recent Labs Lab 06/20/13 1010 06/21/13 0345  WBC 10.9* 9.3  NEUTROABS 6.6  --   HGB 10.4* 9.3*  HCT 32.7* 29.4*  MCV 92.6 93.3  PLT 182 166   Cardiac Enzymes:  Recent Labs Lab 06/20/13 0044  TROPONINI <0.30   BNP: No components found with this basename: POCBNP,  CBG:  Recent Labs Lab 06/21/13 0731 06/21/13 1041 06/21/13 1349 06/21/13 1638 06/21/13 2056  GLUCAP 166* 204* 211* 194* 169*     Micro Results: Recent Results (from the past 240 hour(s))  URINE CULTURE     Status: None  Collection Time    06/20/13  2:58 AM      Result Value Range Status   Specimen Description URINE, CATHETERIZED   Final   Special Requests NONE   Final   Culture  Setup Time     Final   Value: 06/20/2013 08:38     Performed at [place]     Final   Value: >=100,000 COLONIES/ML     Performed at [facility]   Culture     Final   Value: ENTEROCOCCUS SPECIES     Performed at [facility]   Report Status 06/22/2013 FINAL   Final   Organism ID, Bacteria ENTEROCOCCUS SPECIES   Final  MRSA PCR SCREENING     Status: None   Collection Time     06/20/13 10:21 AM      Result Value Range Status   MRSA by PCR NEGATIVE  NEGATIVE Final   Comment:            The GeneXpert MRSA Assay (FDA     approved for NASAL specimens     only), is one component of a     comprehensive MRSA colonization     surveillance program. It is not     intended to diagnose MRSA     infection nor to guide or     monitor treatment for     MRSA infections.    Studies/Results: US Renal  06/21/2013   CLINICAL DATA:  Acute renal failure. Current history of diabetes and hypertension.  EXAM: RENAL/URINARY TRACT ULTRASOUND COMPLETE  COMPARISON:  None.  FINDINGS: Right Kidney:  Length: Approximately 10.1 cm. No hydronephrosis. Well-preserved cortex. No shadowing calculi. Normal parenchymal echotexture without focal abnormalities.  Left Kidney:  Length: Approximately 9.7 cm. No hydronephrosis. Well-preserved cortex. No shadowing calculi. Normal parenchymal echotexture without focal abnormalities.  Bladder:  Echogenic debris in the dependent portion of the bladder. Bladder otherwise normal in appearance.  IMPRESSION: 1. Normal-appearing kidneys without evidence of hydronephrosis. 2. Layering debris in the dependent portion of the bladder which may represent blood or infectious debris. Please correlate with urinalysis.   Electronically Signed   By: Evangeline Dakin M.D.   On: 06/21/2013 15:39    Medications: Scheduled Meds: . amitriptyline  50 mg Oral QHS  . amLODipine  10 mg Oral q morning - 10a  . aspirin  81 mg Oral q morning - 10a  . atorvastatin  40 mg Oral q1800  . carvedilol  25 mg Oral BID WC  . cloNIDine  0.2 mg Oral BID  . enoxaparin (LOVENOX) injection  30 mg Subcutaneous Q24H  . gabapentin  100 mg Oral Custom  . gabapentin  300 mg Oral Q24H  . insulin aspart  0-9 Units Subcutaneous TID WC  . insulin glargine  15 Units Subcutaneous BH-q7a  . levothyroxine  50 mcg Oral QAC breakfast  . mometasone-formoterol  2 puff Inhalation BID  . multivitamin  1  tablet Oral Daily  . sertraline  50 mg Oral Daily      LOS: 3 days   RAI,RIPUDEEP M.D. [facility] 06/22/2013, 11:32 AM Pager: [phone]  If 7PM-7AM, please contact night-coverage [email or web address] Password [email or web address]

## 2013-06-22 NOTE — Progress Notes (Signed)
ANTIBIOTIC CONSULT NOTE - INITIAL  Pharmacy Consult for Levofloxacin Indication: Enterococcus UTI  Allergies  Allergen Reactions  . Codeine Other (See Comments)    unknown  . Lisinopril Other (See Comments)    unknown  . Penicillins Hives  . Sulfur Hives and Rash    Break out     Patient Measurements: Height: 5\' 3"  (160 cm) Weight: 200 lb 6.4 oz (90.901 kg) IBW/kg (Calculated) : 52.4  Vital Signs: Temp: 98.8 F (37.1 C) (02/07 1210) Temp src: Oral (02/07 1210) BP: 138/72 mmHg (02/07 1210) Pulse Rate: 56 (02/07 1210) Intake/Output from previous day: 02/06 0701 - 02/07 0700 In: 1080 [P.O.:480; I.V.:600] Out: 150 [Urine:150] Intake/Output from this shift: Total I/O In: 240 [P.O.:240] Out: -   Labs:  Recent Labs  06/20/13 0044 06/20/13 1010 06/21/13 0345 06/21/13 2103  WBC 10.7* 10.9* 9.3  --   HGB 10.6* 10.4* 9.3*  --   PLT 188 182 166  --   LABCREA  --   --   --  67.98  CREATININE 2.51* 2.16* 2.22*  --    Estimated Creatinine Clearance: 23.1 ml/min (by C-G formula based on Cr of 2.22). No results found for this basename: Letta Median, VANCORANDOM, Penelope, GENTPEAK, GENTRANDOM, TOBRATROUGH, TOBRAPEAK, TOBRARND, AMIKACINPEAK, AMIKACINTROU, AMIKACIN,  in the last 72 hours   Microbiology: Recent Results (from the past 720 hour(s))  URINE CULTURE     Status: None   Collection Time    06/20/13  2:58 AM      Result Value Range Status   Specimen Description URINE, CATHETERIZED   Final   Special Requests NONE   Final   Culture  Setup Time     Final   Value: 06/20/2013 08:38     Performed at [place]     Final   Value: >=100,000 COLONIES/ML     Performed at [facility]   Culture     Final   Value: ENTEROCOCCUS SPECIES     Performed at [facility]   Report Status 06/22/2013 FINAL   Final   Organism ID, Bacteria ENTEROCOCCUS SPECIES   Final  MRSA PCR SCREENING     Status: None   Collection Time     06/20/13 10:21 AM      Result Value Range Status   MRSA by PCR NEGATIVE  NEGATIVE Final   Comment:            The GeneXpert MRSA Assay (FDA     approved for NASAL specimens     only), is one component of a     comprehensive MRSA colonization     surveillance program. It is not     intended to diagnose MRSA     infection nor to guide or     monitor treatment for     MRSA infections.    Medical History: Past Medical History  Diagnosis Date  . Hypertension   . Macular degeneration, bilateral   . Type II diabetes mellitus   . COPD (chronic obstructive pulmonary disease)   . Diabetic peripheral neuropathy   . Hypercholesteremia   . PAD (peripheral artery disease)   . Chronic kidney disease   . Headache(784.0)     Medications:  Scheduled:  . amitriptyline  50 mg Oral QHS  . amLODipine  10 mg Oral q morning - 10a  . aspirin  81 mg Oral q morning - 10a  . atorvastatin  40 mg Oral q1800  .  carvedilol  25 mg Oral BID WC  . cloNIDine  0.2 mg Oral BID  . enoxaparin (LOVENOX) injection  30 mg Subcutaneous Q24H  . gabapentin  100 mg Oral Custom  . gabapentin  300 mg Oral Q24H  . insulin aspart  0-9 Units Subcutaneous TID WC  . insulin aspart  3 Units Subcutaneous TID WC  . [START ON 06/23/2013] insulin glargine  15 Units Subcutaneous q morning - 10a  . levothyroxine  50 mcg Oral QAC breakfast  . mometasone-formoterol  2 puff Inhalation BID  . multivitamin  1 tablet Oral Daily  . sertraline  50 mg Oral Daily   Assessment: 89 yoF presented w/ AMS from living facility. Patient grew out enterococcus on urine cultures >100K, which is not covered by Rocephin. Pharmacy consulted to dose Levofloxacin for UTI. Patient is afebrile, wbc wnl, and poor renal function.   Goal of Therapy:  Eradicate infection  Plan:  - Start Levofloxacin 750mg  x1 PO, followed by 500mg  PO q48h - F/u on renal function and clinical progression - Per MD will plan for 7 days of treatment  Angelica Chessman 06/22/2013,12:39 PM

## 2013-06-22 NOTE — Progress Notes (Signed)
CSW Armed forces technical officer) spoke with pt sons over the phone and provided with bed offers. They informed CSW they will be visiting facilities today. Some requested facilities have not made offers at this time. Pt sons curious about this. CSW explained that they can still go visit any desired facilities today and it may just be that facilities admissions coordinators did not see or get to referral and will be reviewing on Monday. Pt sons aware that weekday CSW will follow up.  [place], [place]

## 2013-06-23 LAB — BASIC METABOLIC PANEL
BUN: 43 mg/dL — AB (ref 6–23)
CO2: 20 mEq/L (ref 19–32)
CREATININE: 1.91 mg/dL — AB (ref 0.50–1.10)
Calcium: 9.5 mg/dL (ref 8.4–10.5)
Chloride: 108 mEq/L (ref 96–112)
GFR, EST AFRICAN AMERICAN: 28 mL/min — AB (ref >90)
GFR, EST NON AFRICAN AMERICAN: 24 mL/min — AB (ref >90)
Glucose, Bld: 134 mg/dL — ABNORMAL HIGH (ref 70–99)
Potassium: 4.9 mEq/L (ref 3.7–5.3)
Sodium: 144 mEq/L (ref 137–147)

## 2013-06-23 LAB — GLUCOSE, CAPILLARY
Glucose-Capillary: 149 mg/dL — ABNORMAL HIGH (ref 70–99)
Glucose-Capillary: 163 mg/dL — ABNORMAL HIGH (ref 70–99)
Glucose-Capillary: 153 mg/dL — ABNORMAL HIGH (ref 70–99)

## 2013-06-23 NOTE — Progress Notes (Signed)
Patient ID: [name]  female  VVO:[id number]    DOB: 06-13-1934    DOA: 06/19/2013  PCP: Mayra Neer, MD  Assessment/Plan: Principal Problem:   Acute encephalopathy improved, likely due to hypoglycemia and UTI - most likely secondary to hypoglycemia with diabetes mellitus type 2. Patient became hypoglycemic probably secondary to poor oral intake with worsening renal function. She was started on dextrose fluids and lantus was held. Her cbg's improved. Her dextrose fluids was stopped, started on 15 units of lantus with SSI. ( At home she was on 60 units of lantus, meal coverage 15units TID. Her hgbaa1c is 6.9%) - Placed on carb modified diet, add 3 units meal coverage   Active Problems:   HYPOTHYROIDISM - Continue Synthroid    DM - Change to carb modified diet, placed on Lantus, meal coverage with sliding scale insulin - See #1    Renal failure (ARF), acute on chronic- improving - Continue to hold Cozaar, creatinine improving Cr 2.5-> 1.9 today - Renal ultrasound shows no hydronephrosis   Enterococcus UTI (lower urinary tract infection) - Urine culture shows enterococcus, resistant to Rocephin, DC'ed Rocephin on 2/7 - Placed on levofloxacin per sensitivities, likely for 7 days, dose adjusted according to renal function by pharmacy  Left foot wound Dressing- cover with silicone foam for protection, no other topical care recommended at this time   DVT Prophylaxis:  Code Status: Full code  Family Communication:  Disposition: likely Monday to SNF    Subjective: feels weak, otherwise no acute complaints   Objective: Weight change: -0.601 kg (-1 lb 5.2 oz)  Intake/Output Summary (Last 24 hours) at 06/23/13 1311 Last data filed at 06/23/13 0604  Gross per 24 hour  Intake    480 ml  Output      0 ml  Net    480 ml   Blood pressure 134/71, pulse 53, temperature 98.4 F (36.9 C), temperature source Oral, resp. rate 19, height 5\' 3"  (1.6 m), weight 90.3 kg (199 lb 1.2  oz), SpO2 94.00%.  Physical Exam: General: Alert and awake, oriented x3, NAD CVS: S1-S2 clear, no murmur rubs or gallops Chest: clear to auscultation bilaterally, no wheezing, rales or rhonchi Abdomen: soft NT, ND, NBS Extremities: no c/c/e bilaterally   Lab Results: Basic Metabolic Panel:  Recent Labs Lab 06/21/13 0345 06/23/13 0611  NA 140 144  K 5.0 4.9  CL 106 108  CO2 21 20  GLUCOSE 218* 134*  BUN 49* 43*  CREATININE 2.22* 1.91*  CALCIUM 8.7 9.5   Liver Function Tests:  Recent Labs Lab 06/20/13 0044 06/20/13 1010  AST 15 14  ALT 8 8  ALKPHOS 48 51  BILITOT 0.3 0.2*  PROT 6.9 6.7  ALBUMIN 3.3* 3.2*   No results found for this basename: LIPASE, AMYLASE,  in the last 168 hours No results found for this basename: AMMONIA,  in the last 168 hours CBC:  Recent Labs Lab 06/20/13 1010 06/21/13 0345  WBC 10.9* 9.3  NEUTROABS 6.6  --   HGB 10.4* 9.3*  HCT 32.7* 29.4*  MCV 92.6 93.3  PLT 182 166   Cardiac Enzymes:  Recent Labs Lab 06/20/13 0044  TROPONINI <0.30   BNP: No components found with this basename: POCBNP,  CBG:  Recent Labs Lab 06/22/13 0831 06/22/13 1209 06/22/13 1643 06/22/13 2240 06/23/13 0747  GLUCAP 123* 164* 96 166* 149*     Micro Results: Recent Results (from the past 240 hour(s))  URINE CULTURE     Status:  None   Collection Time    06/20/13  2:58 AM      Result Value Range Status   Specimen Description URINE, CATHETERIZED   Final   Special Requests NONE   Final   Culture  Setup Time     Final   Value: 06/20/2013 08:38     Performed at [place]     Final   Value: >=100,000 COLONIES/ML     Performed at [facility]   Culture     Final   Value: ENTEROCOCCUS SPECIES     Performed at [facility]   Report Status 06/22/2013 FINAL   Final   Organism ID, Bacteria ENTEROCOCCUS SPECIES   Final  MRSA PCR SCREENING     Status: None   Collection Time    06/20/13 10:21 AM       Result Value Range Status   MRSA by PCR NEGATIVE  NEGATIVE Final   Comment:            The GeneXpert MRSA Assay (FDA     approved for NASAL specimens     only), is one component of a     comprehensive MRSA colonization     surveillance program. It is not     intended to diagnose MRSA     infection nor to guide or     monitor treatment for     MRSA infections.    Studies/Results: US Renal  06/21/2013   CLINICAL DATA:  Acute renal failure. Current history of diabetes and hypertension.  EXAM: RENAL/URINARY TRACT ULTRASOUND COMPLETE  COMPARISON:  None.  FINDINGS: Right Kidney:  Length: Approximately 10.1 cm. No hydronephrosis. Well-preserved cortex. No shadowing calculi. Normal parenchymal echotexture without focal abnormalities.  Left Kidney:  Length: Approximately 9.7 cm. No hydronephrosis. Well-preserved cortex. No shadowing calculi. Normal parenchymal echotexture without focal abnormalities.  Bladder:  Echogenic debris in the dependent portion of the bladder. Bladder otherwise normal in appearance.  IMPRESSION: 1. Normal-appearing kidneys without evidence of hydronephrosis. 2. Layering debris in the dependent portion of the bladder which may represent blood or infectious debris. Please correlate with urinalysis.   Electronically Signed   By: Evangeline Dakin M.D.   On: 06/21/2013 15:39    Medications: Scheduled Meds: . amitriptyline  50 mg Oral QHS  . amLODipine  10 mg Oral q morning - 10a  . aspirin  81 mg Oral q morning - 10a  . atorvastatin  40 mg Oral q1800  . carvedilol  25 mg Oral BID WC  . cloNIDine  0.2 mg Oral BID  . enoxaparin (LOVENOX) injection  30 mg Subcutaneous Q24H  . gabapentin  100 mg Oral Custom  . gabapentin  300 mg Oral Q24H  . insulin aspart  0-9 Units Subcutaneous TID WC  . insulin aspart  3 Units Subcutaneous TID WC  . insulin glargine  15 Units Subcutaneous q morning - 10a  . [START ON 06/24/2013] levofloxacin  500 mg Oral Q48H  . levothyroxine  50 mcg Oral QAC  breakfast  . mometasone-formoterol  2 puff Inhalation BID  . multivitamin  1 tablet Oral Daily  . sertraline  50 mg Oral Daily      LOS: 4 days   RAI,RIPUDEEP M.D. [facility] 06/23/2013, 1:11 PM Pager: [phone]  If 7PM-7AM, please contact night-coverage [email or web address] Password [email or web address]

## 2013-06-24 DIAGNOSIS — N183 Chronic kidney disease, stage 3 unspecified: Secondary | ICD-10-CM

## 2013-06-24 LAB — GLUCOSE, CAPILLARY
GLUCOSE-CAPILLARY: 123 mg/dL — AB (ref 70–99)
GLUCOSE-CAPILLARY: 132 mg/dL — AB (ref 70–99)
Glucose-Capillary: 136 mg/dL — ABNORMAL HIGH (ref 70–99)
Glucose-Capillary: 201 mg/dL — ABNORMAL HIGH (ref 70–99)

## 2013-06-24 MED ORDER — INSULIN LISPRO 100 UNIT/ML CARTRIDGE: 0.0000 [IU] | Freq: Three times a day (TID) | SUBCUTANEOUS | Status: DC

## 2013-06-24 MED ORDER — INSULIN GLARGINE 100 UNIT/ML ~~LOC~~ SOLN
15.0000 [IU] | Freq: Every morning | SUBCUTANEOUS | Status: DC
Start: 2013-06-24 — End: 2013-10-07

## 2013-06-24 MED ORDER — HYDROCODONE-ACETAMINOPHEN 5-325 MG PO TABS: 1.0000 | ORAL_TABLET | Freq: Three times a day (TID) | ORAL | Status: DC | PRN

## 2013-06-24 MED ORDER — INSULIN ASPART 100 UNIT/ML ~~LOC~~ SOLN: 3.0000 [IU] | Freq: Three times a day (TID) | SUBCUTANEOUS | Status: DC

## 2013-06-24 MED ORDER — INSULIN LISPRO 100 UNIT/ML ~~LOC~~ SOLN: 0.0000 [IU] | Freq: Three times a day (TID) | SUBCUTANEOUS | Status: DC

## 2013-06-24 MED ORDER — INSULIN ASPART 100 UNIT/ML ~~LOC~~ SOLN: 0.0000 [IU] | Freq: Three times a day (TID) | SUBCUTANEOUS | Status: DC

## 2013-06-24 MED ORDER — LEVOFLOXACIN 500 MG PO TABS: 500.0000 mg | ORAL_TABLET | ORAL | Status: DC

## 2013-06-24 NOTE — Progress Notes (Signed)
Physical Therapy Treatment Patient Details Name: [name] MRN: [id number] DOB: [birth year] Today's Date: 06/24/2013 Time: 1696-7893 PT Time Calculation (min): 24 min  PT Assessment / Plan / Recommendation  History of Present Illness [name] is a 79 y.o. female history of diabetes mellitus, hypertension, chronic kidney disease, COPD, peripheral arterial disease was brought to the ER the patient was found confused. At her living facility patient was found to have a CBG of 40.  Patient's lab work shows worsening of her creatinine from 1.8-2.5 and UA showed features concerning for UTI.   PT Comments   Patient with improvement in mobility able to stand with walker and take few shuffling steps from seat to seat.  Complains of left foot pain with standing and left flank pain as well.  Will benefit from SNF level care if ALF unable to provide increased level of assist upon D/C.  Follow Up Recommendations  SNF;Home health PT;Supervision/Assistance - 24 hour (depending if ALF can increase level of assist)     Does the patient have the potential to tolerate intense rehabilitation   N/A  Barriers to Discharge  None      Equipment Recommendations  None recommended by PT    Recommendations for Other Services  None  Frequency Min 3X/week   Progress towards PT Goals Progress towards PT goals: Progressing toward goals  Plan Current plan remains appropriate    Precautions / Restrictions Precautions Precautions: Fall   Pertinent Vitals/Pain Left foot, knee and flank pain RN aware    Mobility  Bed Mobility Overal bed mobility: Needs Assistance Bed Mobility: Supine to Sit Supine to sit: Mod assist General bed mobility comments: assist for legs of bed and to lift trunk upright Transfers Equipment used: Rolling walker (2 wheeled) Transfers: Stand Pivot Transfers Sit to Stand: Mod assist Stand pivot transfers: Mod assist General transfer comment: up to walker and small shuffling steps  to [facility], then to recliner with cues and unsafe technique sitting before backed up and centered on chair, hand to readjust scooted each time for centering on [facility], then chair    Exercises General Exercises - Lower Extremity Ankle Circles/Pumps: AROM;15 reps;Both;Supine Quad Sets: AROM;5 reps;Supine;Both Short Arc Quad: AROM;10 reps;5 reps;Left;Right;Supine Heel Slides: AAROM;5 reps;Supine     PT Goals (current goals can now be found in the care plan section)    Visit Information  Last PT Received On: 06/24/13 Assistance Needed: +2 History of Present Illness: [name] is a 79 y.o. female history of diabetes mellitus, hypertension, chronic kidney disease, COPD, peripheral arterial disease was brought to the ER the patient was found confused. At her living facility patient was found to have a CBG of 40.  Patient's lab work shows worsening of her creatinine from 1.8-2.5 and UA showed features concerning for UTI.    Subjective Data      Cognition  Cognition Arousal/Alertness: Awake/alert Behavior During Therapy: WFL for tasks assessed/performed Overall Cognitive Status: Within Functional Limits for tasks assessed    Balance  Balance Standing balance support: During functional activity;Bilateral upper extremity supported Standing balance-Leahy Scale: Poor  End of Session PT - End of Session Equipment Utilized During Treatment: Gait belt Activity Tolerance: Patient limited by fatigue Patient left: in chair;with call bell/phone within reach Nurse Communication: Mobility status   GP     [facility] 06/24/2013, 11:49 AM 06/24/2013  [name], [place] 06/24/2013

## 2013-06-24 NOTE — Progress Notes (Signed)
Occupational Therapy Treatment Patient Details Name: [name] MRN: [id number] DOB: May 08, 1935 Today's Date: 06/24/2013 Time: 7096-2836 OT Time Calculation (min): 15 min  OT Assessment / Plan / Recommendation  History of present illness [name] is a 79 y.o. female history of diabetes mellitus, hypertension, chronic kidney disease, COPD, peripheral arterial disease was brought to the ER the patient was found confused. At her living facility patient was found to have a CBG of 40.  Patient's lab work shows worsening of her creatinine from 1.8-2.5 and UA showed features concerning for UTI.   OT comments  Feel pt will need SNF at d/c to increase independence before return to ALF. If d/c back to ALF will need more assist and HHOT. She is currently limited by 6/10 pain L foot "stabbing" pain. She was up with PT to pivot to [facility] and chair. She declined up again with OT currently due to foot pain.   Follow Up Recommendations  SNF;Other (comment);Supervision/Assistance - 24 hour (feel she will need SNF. If d/c to ALF will need increased care and HHOT)    Barriers to Discharge       Equipment Recommendations  3 in 1 bedside comode    Recommendations for Other Services    Frequency Min 2X/week   Progress towards OT Goals Progress towards OT goals: Not progressing toward goals - comment (pain limiting)  Plan Discharge plan needs to be updated    Precautions / Restrictions Precautions Precautions: Fall Restrictions Weight Bearing Restrictions: No   Pertinent Vitals/Pain 6/10 L foot pain; describes at "stabbing"; informed nursing    ADL  Grooming: Performed;Brushing hair;Wash/dry face Where Assessed - Grooming: Supported sitting ADL Comments: Pt performed grooming from supported sit. She didnt have dentures here to clean. She declined getting out of chair with OT due to L foot with 6/10 stabbing pain. Nursing made aware. Had just gotten up with PT to [facility] and then to chair. She was  agreeable to use UEs in grooming task today.     OT Diagnosis:    OT Problem List:   OT Treatment Interventions:     OT Goals(current goals can now be found in the care plan section)    Visit Information  Last OT Received On: 06/24/13 Assistance Needed: +2 History of Present Illness: [name] is a 79 y.o. female history of diabetes mellitus, hypertension, chronic kidney disease, COPD, peripheral arterial disease was brought to the ER the patient was found confused. At her living facility patient was found to have a CBG of 40.  Patient's lab work shows worsening of her creatinine from 1.8-2.5 and UA showed features concerning for UTI.    Subjective Data      Prior Functioning       Cognition  Cognition Arousal/Alertness: Awake/alert Behavior During Therapy: WFL for tasks assessed/performed Overall Cognitive Status: Within Functional Limits for tasks assessed          Balance Balance Standing balance support: During functional activity;Bilateral upper extremity supported Standing balance-Leahy Scale: Poor  End of Session OT - End of Session Activity Tolerance: Patient limited by pain Patient left: in chair;with call bell/phone within reach  GO     Jules Schick [phone] 06/24/2013, 1:07 PM

## 2013-06-24 NOTE — Discharge Summary (Addendum)
Physician Discharge Summary  [name]:[id number] DOB: Oct 02, 1934 DOA: 06/19/2013  PCP: Mayra Neer, MD  Admit date: 06/19/2013 Discharge date: 06/24/2013  Time spent: 30 minutes  Recommendations for Outpatient Follow-up:  1. Follow up withPCP in one week  Discharge Diagnoses:  Principal Problem:   Acute encephalopathy Active Problems:   HYPOTHYROIDISM   DM   Hypoglycemia   Renal failure (ARF), acute on chronic   UTI (lower urinary tract infection)   Discharge Condition: improved.   Diet recommendation: low sodium diet  Filed Weights   06/21/13 2049 06/22/13 2026 06/23/13 1710  Weight: 90.901 kg (200 lb 6.4 oz) 90.3 kg (199 lb 1.2 oz) 91.7 kg (202 lb 2.6 oz)    History of present illness:  [name] is a 79 y.o. female history of diabetes mellitus, hypertension, chronic kidney disease, COPD, peripheral arterial disease was brought to the ER the patient was found confused. At her living facility patient was found to have a CBG of 40 and D50 was given a fall in which she became more alert and awake and was brought to the ER. In the ER patient is alert and awake and nonfocal and blood sugars still around 60s. Patient's lab work shows worsening of her creatinine from 1.8-2.5 and UA showed features concerning for UTI. Patient has been started on D5 normal and ceftriaxone and admitted for further workup   Hospital Course:   Acute encephalopathy improved, likely due to hypoglycemia and UTI  - most likely secondary to hypoglycemia with diabetes mellitus type 2. Patient became hypoglycemic probably secondary to poor oral intake with worsening renal function. She was started on dextrose fluids and lantus was held. Her cbg's improved. Her dextrose fluids was stopped, started on 15 units of lantus with SSI. ( At home she was on 60 units of lantus, meal coverage 15units TID. Her hgbaa1c is 6.9%)  - Placed on carb modified diet, add 3 units meal coverage   HYPOTHYROIDISM  -  Continue Synthroid  DM  - Change to carb modified diet, placed on Lantus, meal coverage with sliding scale insulin   Renal failure (ARF), acute on chronic- improving  - Continue to hold Cozaar, creatinine improving Cr 2.5-> 1.9 today  - Renal ultrasound shows no hydronephrosis . resume lasix Enterococcus UTI (lower urinary tract infection)  - Urine culture shows enterococcus, resistant to Rocephin, DC'ed Rocephin on 2/7  - Placed on levofloxacin per sensitivities, likely for 7 days,. Left foot wound  Dressing- cover with silicone foam for protection, no other topical care recommended at this time  Procedures:  none  Consultations:  none  Discharge Exam: Filed Vitals:   06/24/13 1800  BP: 139/55  Pulse: 52  Temp: 98.8 F (37.1 C)  Resp: 18   Physical Exam:  General: Alert and awake, oriented x3, NAD  CVS: S1-S2 clear, no murmur rubs or gallops  Chest: clear to auscultation bilaterally, no wheezing, rales or rhonchi  Abdomen: soft NT, ND, NBS  Extremities: no c/c/e bilaterally   Discharge Instructions      Discharge Orders   Future Orders Complete By Expires   Diet - low sodium heart healthy  As directed    Discharge instructions  As directed    Comments:     Follow up with PCP in one week       Medication List    STOP taking these medications       losartan 25 MG tablet  Commonly known as:  COZAAR  TAKE these medications       acetaminophen 500 MG tablet  Commonly known as:  TYLENOL  Take 500 mg by mouth every 4 (four) hours as needed for mild pain, fever or headache.     albuterol (2.5 MG/3ML) 0.083% nebulizer solution  Commonly known as:  PROVENTIL  Take 2.5 mg by nebulization every 6 (six) hours as needed for wheezing.     ALPRAZolam 0.25 MG tablet  Commonly known as:  XANAX  Take 0.25 mg by mouth 2 (two) times daily as needed for anxiety.     amitriptyline 50 MG tablet  Commonly known as:  ELAVIL  Take 50 mg by mouth at bedtime.      amLODipine 10 MG tablet  Commonly known as:  NORVASC  Take 10 mg by mouth every morning.     aspirin 81 MG chewable tablet  Chew 81 mg by mouth every morning.     carvedilol 25 MG tablet  Commonly known as:  COREG  Take 25 mg by mouth 2 (two) times daily with a meal. 0800 and 1700     cloNIDine 0.2 MG tablet  Commonly known as:  CATAPRES  Take 0.2 mg by mouth 2 (two) times daily.     Fluticasone-Salmeterol 100-50 MCG/DOSE Aepb  Commonly known as:  ADVAIR  Inhale 1 puff into the lungs every 12 (twelve) hours.     furosemide 40 MG tablet  Commonly known as:  LASIX  Take 40 mg by mouth daily.     gabapentin 100 MG capsule  Commonly known as:  NEURONTIN  Take 100-300 mg by mouth 3 (three) times daily. 1 capsule at 0800, 1 capsule at 1400, and 3 capsules at 2000.     guaiFENesin 100 MG/5ML Soln  Commonly known as:  ROBITUSSIN  Take 10 mLs by mouth every 4 (four) hours as needed for cough or to loosen phlegm.     HEALTHY EYES Tabs  Take 1 tablet by mouth every morning.     HYDROcodone-acetaminophen 5-325 MG per tablet  Commonly known as:  NORCO/VICODIN  Take 1 tablet by mouth every 8 (eight) hours as needed for moderate pain or severe pain.     insulin glargine 100 UNIT/ML injection  Commonly known as:  LANTUS  Inject 0.15 mLs (15 Units total) into the skin every morning.     insulin lispro 100 UNIT/ML injection  Commonly known as:  HUMALOG  Inject 0-9 Units into the skin 3 (three) times daily before meals.     ipratropium 0.02 % nebulizer solution  Commonly known as:  ATROVENT  Take 0.5 mg by nebulization every 6 (six) hours as needed for wheezing or shortness of breath.     levofloxacin 500 MG tablet  Commonly known as:  LEVAQUIN  Take 1 tablet (500 mg total) by mouth every other day.     levothyroxine 50 MCG tablet  Commonly known as:  SYNTHROID, LEVOTHROID  Take 50 mcg by mouth every morning.     polyethylene glycol packet  Commonly known as:  MIRALAX /  GLYCOLAX  Take 51 g by mouth daily as needed for mild constipation, moderate constipation or severe constipation.     rosuvastatin 20 MG tablet  Commonly known as:  CRESTOR  Take 20 mg by mouth every evening.     senna-docusate 8.6-50 MG per tablet  Commonly known as:  Senokot-S  Take 1 tablet by mouth at bedtime as needed for mild constipation or moderate constipation.  sertraline 50 MG tablet  Commonly known as:  ZOLOFT  Take 50 mg by mouth daily.     SKIN PREP WIPES Misc  Apply 1 application topically 2 (two) times daily.       Allergies  Allergen Reactions  . Codeine Other (See Comments)    unknown  . Lisinopril Other (See Comments)    unknown  . Penicillins Hives  . Sulfur Hives and Rash    Break out    Follow-up Information   Follow up with SHAW,KIMBERLEE, MD. Schedule an appointment as soon as possible for a visit in 1 week.   Specialty:  Family Medicine   Contact information:   [address] [place] [address] [phone]        The results of significant diagnostics from this hospitalization (including imaging, microbiology, ancillary and laboratory) are listed below for reference.    Significant Diagnostic Studies: US Renal  06/21/2013   CLINICAL DATA:  Acute renal failure. Current history of diabetes and hypertension.  EXAM: RENAL/URINARY TRACT ULTRASOUND COMPLETE  COMPARISON:  None.  FINDINGS: Right Kidney:  Length: Approximately 10.1 cm. No hydronephrosis. Well-preserved cortex. No shadowing calculi. Normal parenchymal echotexture without focal abnormalities.  Left Kidney:  Length: Approximately 9.7 cm. No hydronephrosis. Well-preserved cortex. No shadowing calculi. Normal parenchymal echotexture without focal abnormalities.  Bladder:  Echogenic debris in the dependent portion of the bladder. Bladder otherwise normal in appearance.  IMPRESSION: 1. Normal-appearing kidneys without evidence of hydronephrosis. 2. Layering debris in the  dependent portion of the bladder which may represent blood or infectious debris. Please correlate with urinalysis.   Electronically Signed   By: Evangeline Dakin M.D.   On: 06/21/2013 15:39    Microbiology: Recent Results (from the past 240 hour(s))  URINE CULTURE     Status: None   Collection Time    06/20/13  2:58 AM      Result Value Range Status   Specimen Description URINE, CATHETERIZED   Final   Special Requests NONE   Final   Culture  Setup Time     Final   Value: 06/20/2013 08:38     Performed at [facility]     Final   Value: >=100,000 COLONIES/ML     Performed at [facility]   Culture     Final   Value: ENTEROCOCCUS SPECIES     Performed at [facility]   Report Status 06/22/2013 FINAL   Final   Organism ID, Bacteria ENTEROCOCCUS SPECIES   Final  MRSA PCR SCREENING     Status: None   Collection Time    06/20/13 10:21 AM      Result Value Range Status   MRSA by PCR NEGATIVE  NEGATIVE Final   Comment:            The GeneXpert MRSA Assay (FDA     approved for NASAL specimens     only), is one component of a     comprehensive MRSA colonization     surveillance program. It is not     intended to diagnose MRSA     infection nor to guide or     monitor treatment for     MRSA infections.     Labs: Basic Metabolic Panel:  Recent Labs Lab 06/20/13 0044 06/20/13 1010 06/21/13 0345 06/23/13 0611  NA 144 146 140 144  K 4.8 4.6 5.0 4.9  CL 108 109 106 108  CO2  24 24 21 20   GLUCOSE 95 114* 218* 134*  BUN 56* 50* 49* 43*  CREATININE 2.51* 2.16* 2.22* 1.91*  CALCIUM 9.1 9.3 8.7 9.5   Liver Function Tests:  Recent Labs Lab 06/20/13 0044 06/20/13 1010  AST 15 14  ALT 8 8  ALKPHOS 48 51  BILITOT 0.3 0.2*  PROT 6.9 6.7  ALBUMIN 3.3* 3.2*   No results found for this basename: LIPASE, AMYLASE,  in the last 168 hours No results found for this basename: AMMONIA,  in the last 168 hours CBC:  Recent Labs Lab  06/20/13 0044 06/20/13 1010 06/21/13 0345  WBC 10.7* 10.9* 9.3  NEUTROABS  --  6.6  --   HGB 10.6* 10.4* 9.3*  HCT 33.9* 32.7* 29.4*  MCV 92.9 92.6 93.3  PLT 188 182 166   Cardiac Enzymes:  Recent Labs Lab 06/20/13 0044  TROPONINI <0.30   BNP: BNP (last 3 results) No results found for this basename: PROBNP,  in the last 8760 hours CBG:  Recent Labs Lab 06/23/13 1716 06/23/13 2126 06/24/13 0816 06/24/13 1211 06/24/13 1634  GLUCAP 153* 123* 136* 201* 132*       Signed:  Earland Reish  [facility] 06/24/2013, 6:44 PM

## 2013-06-24 NOTE — Progress Notes (Signed)
Patient discharged to [facility], report called to Elenore Rota, [place]. Patient AVS faxed to facility and reviewed with RN over the phone. Patient remains stable; no signs or symptoms of distress.  Patient transported via EMS with belongings at her side.

## 2013-08-07 ENCOUNTER — Encounter (INDEPENDENT_AMBULATORY_CARE_PROVIDER_SITE_OTHER): Payer: Medicare Other | Admitting: Ophthalmology

## 2013-08-07 DIAGNOSIS — H3581 Retinal edema: Secondary | ICD-10-CM

## 2013-08-07 DIAGNOSIS — I10 Essential (primary) hypertension: Secondary | ICD-10-CM

## 2013-08-07 DIAGNOSIS — H35039 Hypertensive retinopathy, unspecified eye: Secondary | ICD-10-CM

## 2013-08-07 DIAGNOSIS — E1139 Type 2 diabetes mellitus with other diabetic ophthalmic complication: Secondary | ICD-10-CM

## 2013-08-07 DIAGNOSIS — E11319 Type 2 diabetes mellitus with unspecified diabetic retinopathy without macular edema: Secondary | ICD-10-CM

## 2013-08-07 DIAGNOSIS — H43819 Vitreous degeneration, unspecified eye: Secondary | ICD-10-CM

## 2013-08-07 DIAGNOSIS — E1165 Type 2 diabetes mellitus with hyperglycemia: Secondary | ICD-10-CM

## 2013-08-07 DIAGNOSIS — H353190 Nonexudative age-related macular degeneration, unspecified eye, stage unspecified: Secondary | ICD-10-CM

## 2013-08-26 ENCOUNTER — Encounter (HOSPITAL_BASED_OUTPATIENT_CLINIC_OR_DEPARTMENT_OTHER): Payer: Medicare Other | Attending: Plastic Surgery

## 2013-08-26 DIAGNOSIS — J4489 Other specified chronic obstructive pulmonary disease: Secondary | ICD-10-CM | POA: Insufficient documentation

## 2013-08-26 DIAGNOSIS — J449 Chronic obstructive pulmonary disease, unspecified: Secondary | ICD-10-CM | POA: Insufficient documentation

## 2013-08-26 DIAGNOSIS — L8990 Pressure ulcer of unspecified site, unspecified stage: Secondary | ICD-10-CM | POA: Insufficient documentation

## 2013-08-26 DIAGNOSIS — I129 Hypertensive chronic kidney disease with stage 1 through stage 4 chronic kidney disease, or unspecified chronic kidney disease: Secondary | ICD-10-CM | POA: Insufficient documentation

## 2013-08-26 DIAGNOSIS — F17200 Nicotine dependence, unspecified, uncomplicated: Secondary | ICD-10-CM | POA: Insufficient documentation

## 2013-08-26 DIAGNOSIS — G609 Hereditary and idiopathic neuropathy, unspecified: Secondary | ICD-10-CM | POA: Insufficient documentation

## 2013-08-26 DIAGNOSIS — L97409 Non-pressure chronic ulcer of unspecified heel and midfoot with unspecified severity: Secondary | ICD-10-CM | POA: Insufficient documentation

## 2013-08-26 DIAGNOSIS — E119 Type 2 diabetes mellitus without complications: Secondary | ICD-10-CM | POA: Insufficient documentation

## 2013-08-26 DIAGNOSIS — L89309 Pressure ulcer of unspecified buttock, unspecified stage: Secondary | ICD-10-CM | POA: Insufficient documentation

## 2013-08-26 DIAGNOSIS — N189 Chronic kidney disease, unspecified: Secondary | ICD-10-CM | POA: Insufficient documentation

## 2013-08-27 NOTE — Progress Notes (Signed)
[facility]  [name], [name]                ACCOUNT NO.:  [id number]  MEDICAL RECORD NO.:  [id number]      DATE OF BIRTH:  December 26, 1934  PHYSICIAN:  Irene Limbo, MD    VISIT DATE:  08/26/2013                                  OFFICE VISIT   CHIEF COMPLAINT: 1. Pressure ulceration of buttock. 2. Ulceration of left heel in the setting of diabetes mellitus and     pressure.  HISTORY OF PRESENT ILLNESS:  The patient is a 79 year old female that is a previous [facility] patient.  She was last seen in December 2014, and she was discharged following complete healing of the left heel wound.  The patient resides in [facility].  She reports that she is completely nonambulatory.  The cause of this is unclear. She does not report a low air loss mattress in her facility.  She is not using any protective foot wear.  She continues to smoke actively. Review of her SNF records indicate she does have nutritional supplementation ordered.  However, the patient herself states she has only received this twice.  PAST MEDICAL HISTORY:  Significant for diabetes mellitus, COPD, peripheral neuropathy, chronic kidney disease, hypertension.  She was recently admitted in to the hospital in February 2015, for UTI with encephalopathy.  PHYSICAL EXAMINATION:  Blood pressure is 133/74, pulse is 62, temperature is 97.7.  Blood glucose was not obtained at her visit today. Over her left lateral heel, she has eschar in place.  Scissors were used to sharply excise this necrotic material.  Post debridement measurements were 2 x 1.5 x 0.2 cm.  There appears to be subcutaneous fat beneath the eschar following debridement.  Over her right buttock, she has a fibrinous tissue present over an area measured at 1.2 x 1.1 x 0.1 cm. The patient is sensate and the area of fibrin was very tuff in nature and unable to perform any debridement in this area.  Plan for this week is to  obtain laboratories including pre-albumin, hemoglobin A1c. Low air loss mattress, and protective offloading shoes were ordered.  Review of the patient's chart indicates she did have an x-ray in February 2015, that was negative for osteomyelitis over the left foot.  We will plan to institute Prisma with foam dressing to be done 3 times weekly.  We will start Santyl over her buttock wound daily and plan for followup in 2 weeks' time.          ______________________________ Irene Limbo, MD MBA     BT/MEDQ  D:  08/26/2013  T:  08/27/2013  Job:  [id number]

## 2013-09-09 LAB — GLUCOSE, CAPILLARY: Glucose-Capillary: 343 mg/dL — ABNORMAL HIGH (ref 70–99)

## 2013-09-16 NOTE — Progress Notes (Signed)
[facility]  NAME:  [name], [name]                     ACCOUNT NO.:  MEDICAL RECORD NO.:  [id number]      DATE OF BIRTH:  Oct 02, 1934  PHYSICIAN:  Irene Limbo, MD    VISIT DATE:  09/09/2013                                  OFFICE VISIT   CHIEF COMPLAINT: 1. Pressure ulceration of buttock. 2. Ulceration of left heel in the setting of diabetes mellitus and     pressure.  HISTORY OF PRESENT ILLNESS:  The patient is a 79 year old female that resides in [place].  She presents for followup of recurrent left heel ulceration.  She is currently completely nonambulatory.  She presents today with a note from her facility stating that they were unable to obtain [place] boots of any kind.  We did also order a low air loss mattress in her facility and it is unclear whether this was obtained either. She had laboratories ordered and no report of these were sent with the patient today.  PHYSICAL EXAMINATION:  Blood pressure is 180/85, pulse is 75, temperature is 98.3, blood glucose today was 343.  Right buttock wound is a stage II ulceration that is contracted since her last visit measured at 0.7 x 0.9 x 0.1 cm.  The left lateral heel is also stage II ulceration or Wagner grade 2 ulceration, measured at 2 x 1.2 x 0.4 cm. Using a curette, selective debridement was performed to the buttock ulceration to remove the superficial slough over its entirety.  However, the left lateral heel wound scissors were used to sharply excise the skin edges and subcutaneous tissue over the entire wound base for gross necrotic material.  The patient tolerated the procedure well.  ASSESSMENT AND PLAN:  We will switch to collagen over both of her wounds.  She was ordered to have the TCOM of her foot today in clinic, however, this was not obtained.  We will refer her to Vascular Surgery for ABI.  We provide a letter to her facility to use any type of heel offloading boot or  product that they use for their other patients.  We also placed a message for the facility to send her laboratory with her next visit.          ______________________________ Irene Limbo, MD MBA     BT/MEDQ  D:  09/09/2013  T:  09/10/2013  Job:  [id number]

## 2013-09-20 ENCOUNTER — Other Ambulatory Visit (HOSPITAL_COMMUNITY): Payer: Self-pay | Admitting: Plastic Surgery

## 2013-09-20 ENCOUNTER — Ambulatory Visit (HOSPITAL_COMMUNITY)
Admission: RE | Admit: 2013-09-20 | Discharge: 2013-09-20 | Disposition: A | Discharge status: Home / Self Care | Payer: Medicare Other | Source: Ambulatory Visit | Attending: Vascular Surgery | Admitting: Vascular Surgery

## 2013-09-20 DIAGNOSIS — L97909 Non-pressure chronic ulcer of unspecified part of unspecified lower leg with unspecified severity: Secondary | ICD-10-CM

## 2013-09-23 ENCOUNTER — Encounter (HOSPITAL_BASED_OUTPATIENT_CLINIC_OR_DEPARTMENT_OTHER): Payer: Medicare Other | Attending: Plastic Surgery

## 2013-09-23 DIAGNOSIS — L89609 Pressure ulcer of unspecified heel, unspecified stage: Secondary | ICD-10-CM | POA: Diagnosis present

## 2013-09-23 DIAGNOSIS — L8992 Pressure ulcer of unspecified site, stage 2: Secondary | ICD-10-CM | POA: Insufficient documentation

## 2013-09-23 DIAGNOSIS — L89309 Pressure ulcer of unspecified buttock, unspecified stage: Secondary | ICD-10-CM | POA: Diagnosis not present

## 2013-10-07 ENCOUNTER — Emergency Department (HOSPITAL_COMMUNITY): Payer: Medicare Other

## 2013-10-07 ENCOUNTER — Encounter (HOSPITAL_COMMUNITY): Payer: Self-pay | Admitting: Emergency Medicine

## 2013-10-07 ENCOUNTER — Inpatient Hospital Stay (HOSPITAL_COMMUNITY): Payer: Medicare Other

## 2013-10-07 ENCOUNTER — Inpatient Hospital Stay (HOSPITAL_COMMUNITY)
Admission: EM | Admit: 2013-10-07 | Discharge: 2013-10-10 | DRG: 069 | Disposition: A | Discharge status: Skilled Nursing Facility | Payer: Medicare Other | Attending: Internal Medicine | Admitting: Internal Medicine

## 2013-10-07 DIAGNOSIS — R609 Edema, unspecified: Secondary | ICD-10-CM

## 2013-10-07 DIAGNOSIS — E669 Obesity, unspecified: Secondary | ICD-10-CM | POA: Diagnosis present

## 2013-10-07 DIAGNOSIS — J449 Chronic obstructive pulmonary disease, unspecified: Secondary | ICD-10-CM | POA: Diagnosis present

## 2013-10-07 DIAGNOSIS — G459 Transient cerebral ischemic attack, unspecified: Secondary | ICD-10-CM | POA: Diagnosis present

## 2013-10-07 DIAGNOSIS — H3530 Unspecified macular degeneration: Secondary | ICD-10-CM | POA: Diagnosis present

## 2013-10-07 DIAGNOSIS — I2510 Atherosclerotic heart disease of native coronary artery without angina pectoris: Secondary | ICD-10-CM

## 2013-10-07 DIAGNOSIS — I129 Hypertensive chronic kidney disease with stage 1 through stage 4 chronic kidney disease, or unspecified chronic kidney disease: Secondary | ICD-10-CM | POA: Diagnosis present

## 2013-10-07 DIAGNOSIS — E039 Hypothyroidism, unspecified: Secondary | ICD-10-CM

## 2013-10-07 DIAGNOSIS — F17200 Nicotine dependence, unspecified, uncomplicated: Secondary | ICD-10-CM | POA: Diagnosis present

## 2013-10-07 DIAGNOSIS — I10 Essential (primary) hypertension: Secondary | ICD-10-CM

## 2013-10-07 DIAGNOSIS — R29810 Facial weakness: Secondary | ICD-10-CM

## 2013-10-07 DIAGNOSIS — R5383 Other fatigue: Secondary | ICD-10-CM

## 2013-10-07 DIAGNOSIS — R0602 Shortness of breath: Secondary | ICD-10-CM

## 2013-10-07 DIAGNOSIS — N183 Chronic kidney disease, stage 3 unspecified: Secondary | ICD-10-CM | POA: Diagnosis present

## 2013-10-07 DIAGNOSIS — E1142 Type 2 diabetes mellitus with diabetic polyneuropathy: Secondary | ICD-10-CM | POA: Diagnosis present

## 2013-10-07 DIAGNOSIS — J441 Chronic obstructive pulmonary disease with (acute) exacerbation: Secondary | ICD-10-CM

## 2013-10-07 DIAGNOSIS — I739 Peripheral vascular disease, unspecified: Secondary | ICD-10-CM | POA: Diagnosis present

## 2013-10-07 DIAGNOSIS — N390 Urinary tract infection, site not specified: Secondary | ICD-10-CM | POA: Diagnosis present

## 2013-10-07 DIAGNOSIS — N189 Chronic kidney disease, unspecified: Secondary | ICD-10-CM

## 2013-10-07 DIAGNOSIS — R531 Weakness: Secondary | ICD-10-CM | POA: Diagnosis present

## 2013-10-07 DIAGNOSIS — Z7982 Long term (current) use of aspirin: Secondary | ICD-10-CM

## 2013-10-07 DIAGNOSIS — Z6833 Body mass index (BMI) 33.0-33.9, adult: Secondary | ICD-10-CM | POA: Diagnosis not present

## 2013-10-07 DIAGNOSIS — E7800 Pure hypercholesterolemia, unspecified: Secondary | ICD-10-CM | POA: Diagnosis present

## 2013-10-07 DIAGNOSIS — L97409 Non-pressure chronic ulcer of unspecified heel and midfoot with unspecified severity: Secondary | ICD-10-CM | POA: Diagnosis present

## 2013-10-07 DIAGNOSIS — I5189 Other ill-defined heart diseases: Secondary | ICD-10-CM

## 2013-10-07 DIAGNOSIS — N12 Tubulo-interstitial nephritis, not specified as acute or chronic: Secondary | ICD-10-CM

## 2013-10-07 DIAGNOSIS — S0121XA Laceration without foreign body of nose, initial encounter: Secondary | ICD-10-CM

## 2013-10-07 DIAGNOSIS — R5381 Other malaise: Secondary | ICD-10-CM | POA: Diagnosis present

## 2013-10-07 DIAGNOSIS — D649 Anemia, unspecified: Secondary | ICD-10-CM | POA: Diagnosis present

## 2013-10-07 DIAGNOSIS — E1149 Type 2 diabetes mellitus with other diabetic neurological complication: Secondary | ICD-10-CM | POA: Diagnosis present

## 2013-10-07 DIAGNOSIS — E785 Hyperlipidemia, unspecified: Secondary | ICD-10-CM | POA: Diagnosis present

## 2013-10-07 DIAGNOSIS — Z79899 Other long term (current) drug therapy: Secondary | ICD-10-CM

## 2013-10-07 DIAGNOSIS — M1990 Unspecified osteoarthritis, unspecified site: Secondary | ICD-10-CM | POA: Diagnosis present

## 2013-10-07 DIAGNOSIS — R7989 Other specified abnormal findings of blood chemistry: Secondary | ICD-10-CM

## 2013-10-07 DIAGNOSIS — G9340 Encephalopathy, unspecified: Secondary | ICD-10-CM

## 2013-10-07 DIAGNOSIS — R4182 Altered mental status, unspecified: Secondary | ICD-10-CM

## 2013-10-07 DIAGNOSIS — E162 Hypoglycemia, unspecified: Secondary | ICD-10-CM

## 2013-10-07 DIAGNOSIS — R471 Dysarthria and anarthria: Secondary | ICD-10-CM

## 2013-10-07 DIAGNOSIS — R0902 Hypoxemia: Secondary | ICD-10-CM

## 2013-10-07 DIAGNOSIS — E119 Type 2 diabetes mellitus without complications: Secondary | ICD-10-CM | POA: Diagnosis present

## 2013-10-07 DIAGNOSIS — R1310 Dysphagia, unspecified: Secondary | ICD-10-CM

## 2013-10-07 DIAGNOSIS — W19XXXA Unspecified fall, initial encounter: Secondary | ICD-10-CM

## 2013-10-07 DIAGNOSIS — E875 Hyperkalemia: Secondary | ICD-10-CM

## 2013-10-07 DIAGNOSIS — M545 Low back pain, unspecified: Secondary | ICD-10-CM

## 2013-10-07 DIAGNOSIS — N179 Acute kidney failure, unspecified: Secondary | ICD-10-CM

## 2013-10-07 DIAGNOSIS — J4489 Other specified chronic obstructive pulmonary disease: Secondary | ICD-10-CM | POA: Diagnosis present

## 2013-10-07 DIAGNOSIS — L97429 Non-pressure chronic ulcer of left heel and midfoot with unspecified severity: Secondary | ICD-10-CM | POA: Diagnosis present

## 2013-10-07 HISTORY — DX: Atherosclerotic heart disease of native coronary artery without angina pectoris: I25.10

## 2013-10-07 HISTORY — DX: Hypothyroidism, unspecified: E03.9

## 2013-10-07 LAB — DIFFERENTIAL
BASOS ABS: 0 10*3/uL (ref 0.0–0.1)
Basophils Relative: 0 % (ref 0–1)
EOS PCT: 1 % (ref 0–5)
Eosinophils Absolute: 0.2 10*3/uL (ref 0.0–0.7)
LYMPHS ABS: 3.2 10*3/uL (ref 0.7–4.0)
Lymphocytes Relative: 23 % (ref 12–46)
Monocytes Absolute: 1.4 10*3/uL — ABNORMAL HIGH (ref 0.1–1.0)
Monocytes Relative: 10 % (ref 3–12)
Neutro Abs: 9.3 10*3/uL — ABNORMAL HIGH (ref 1.7–7.7)
Neutrophils Relative %: 66 % (ref 43–77)

## 2013-10-07 LAB — CBC
HCT: 32.1 % — ABNORMAL LOW (ref 36.0–46.0)
Hemoglobin: 10.2 g/dL — ABNORMAL LOW (ref 12.0–15.0)
MCH: 28.9 pg (ref 26.0–34.0)
MCHC: 31.8 g/dL (ref 30.0–36.0)
MCV: 90.9 fL (ref 78.0–100.0)
PLATELETS: 188 10*3/uL (ref 150–400)
RBC: 3.53 MIL/uL — ABNORMAL LOW (ref 3.87–5.11)
RDW: 13.4 % (ref 11.5–15.5)
WBC: 14.2 10*3/uL — ABNORMAL HIGH (ref 4.0–10.5)

## 2013-10-07 LAB — URINALYSIS, ROUTINE W REFLEX MICROSCOPIC
BILIRUBIN URINE: NEGATIVE
GLUCOSE, UA: 250 mg/dL — AB
KETONES UR: NEGATIVE mg/dL
Nitrite: NEGATIVE
Protein, ur: 100 mg/dL — AB
Specific Gravity, Urine: 1.022 (ref 1.005–1.030)
Urobilinogen, UA: 1 mg/dL (ref 0.0–1.0)
pH: 5 (ref 5.0–8.0)

## 2013-10-07 LAB — I-STAT CHEM 8, ED
BUN: 42 mg/dL — ABNORMAL HIGH (ref 6–23)
Calcium, Ion: 1.28 mmol/L (ref 1.13–1.30)
Chloride: 102 mEq/L (ref 96–112)
Creatinine, Ser: 1.9 mg/dL — ABNORMAL HIGH (ref 0.50–1.10)
Glucose, Bld: 379 mg/dL — ABNORMAL HIGH (ref 70–99)
HEMATOCRIT: 34 % — AB (ref 36.0–46.0)
HEMOGLOBIN: 11.6 g/dL — AB (ref 12.0–15.0)
POTASSIUM: 4.8 meq/L (ref 3.7–5.3)
SODIUM: 139 meq/L (ref 137–147)
TCO2: 24 mmol/L (ref 0–100)

## 2013-10-07 LAB — COMPREHENSIVE METABOLIC PANEL
ALT: 8 U/L (ref 0–35)
AST: 10 U/L (ref 0–37)
Albumin: 2.8 g/dL — ABNORMAL LOW (ref 3.5–5.2)
Alkaline Phosphatase: 61 U/L (ref 39–117)
BUN: 44 mg/dL — ABNORMAL HIGH (ref 6–23)
CALCIUM: 9.2 mg/dL (ref 8.4–10.5)
CO2: 22 mEq/L (ref 19–32)
CREATININE: 1.81 mg/dL — AB (ref 0.50–1.10)
Chloride: 100 mEq/L (ref 96–112)
GFR calc non Af Amer: 26 mL/min — ABNORMAL LOW (ref >90)
GFR, EST AFRICAN AMERICAN: 30 mL/min — AB (ref >90)
Glucose, Bld: 380 mg/dL — ABNORMAL HIGH (ref 70–99)
Potassium: 4.9 mEq/L (ref 3.7–5.3)
SODIUM: 136 meq/L — AB (ref 137–147)
TOTAL PROTEIN: 6.4 g/dL (ref 6.0–8.3)
Total Bilirubin: 0.3 mg/dL (ref 0.3–1.2)

## 2013-10-07 LAB — URINE MICROSCOPIC-ADD ON

## 2013-10-07 LAB — GLUCOSE, CAPILLARY
GLUCOSE-CAPILLARY: 274 mg/dL — AB (ref 70–99)
Glucose-Capillary: 303 mg/dL — ABNORMAL HIGH (ref 70–99)

## 2013-10-07 LAB — RAPID URINE DRUG SCREEN, HOSP PERFORMED
AMPHETAMINES: NOT DETECTED
Barbiturates: NOT DETECTED
Benzodiazepines: NOT DETECTED
COCAINE: NOT DETECTED
Opiates: POSITIVE — AB
Tetrahydrocannabinol: NOT DETECTED

## 2013-10-07 LAB — HEMOGLOBIN A1C
HEMOGLOBIN A1C: 9.8 % — AB (ref <5.7)
Mean Plasma Glucose: 235 mg/dL — ABNORMAL HIGH (ref <117)

## 2013-10-07 LAB — PROTIME-INR
INR: 1.01 (ref 0.00–1.49)
PROTHROMBIN TIME: 13.1 s (ref 11.6–15.2)

## 2013-10-07 LAB — APTT: aPTT: 28 seconds (ref 24–37)

## 2013-10-07 LAB — I-STAT TROPONIN, ED: Troponin i, poc: 0 ng/mL (ref 0.00–0.08)

## 2013-10-07 LAB — CBG MONITORING, ED: GLUCOSE-CAPILLARY: 315 mg/dL — AB (ref 70–99)

## 2013-10-07 LAB — ETHANOL: Alcohol, Ethyl (B): <11 mg/dL (ref 0–11)

## 2013-10-07 MED ORDER — SENNOSIDES-DOCUSATE SODIUM 8.6-50 MG PO TABS
2.0000 | ORAL_TABLET | Freq: Every day | ORAL | Status: DC
  Administered 2013-10-07 – 2013-10-09 (×3): 2 via ORAL
  Filled 2013-10-07 (×3): qty 2

## 2013-10-07 MED ORDER — ASPIRIN 325 MG PO TABS
325.0000 mg | ORAL_TABLET | Freq: Every day | ORAL | Status: DC
  Administered 2013-10-08 – 2013-10-10 (×2): 325 mg via ORAL
  Filled 2013-10-07 (×3): qty 1

## 2013-10-07 MED ORDER — GABAPENTIN 100 MG PO CAPS
100.0000 mg | ORAL_CAPSULE | Freq: Two times a day (BID) | ORAL | Status: DC
  Administered 2013-10-08 – 2013-10-10 (×4): 100 mg via ORAL
  Filled 2013-10-07 (×8): qty 1

## 2013-10-07 MED ORDER — INSULIN GLARGINE 100 UNIT/ML ~~LOC~~ SOLN
35.0000 [IU] | Freq: Every day | SUBCUTANEOUS | Status: DC
  Administered 2013-10-07 – 2013-10-09 (×3): 35 [IU] via SUBCUTANEOUS
  Filled 2013-10-07 (×5): qty 0.35

## 2013-10-07 MED ORDER — GABAPENTIN 300 MG PO CAPS
300.0000 mg | ORAL_CAPSULE | Freq: Every day | ORAL | Status: DC
  Administered 2013-10-07 – 2013-10-09 (×3): 300 mg via ORAL
  Filled 2013-10-07 (×5): qty 1

## 2013-10-07 MED ORDER — ALBUTEROL SULFATE (2.5 MG/3ML) 0.083% IN NEBU: 2.5000 mg | INHALATION_SOLUTION | Freq: Four times a day (QID) | RESPIRATORY_TRACT | Status: DC | PRN

## 2013-10-07 MED ORDER — ACETAMINOPHEN 325 MG PO TABS: 650.0000 mg | ORAL_TABLET | ORAL | Status: DC | PRN

## 2013-10-07 MED ORDER — SODIUM CHLORIDE 0.9 % IV SOLN
INTRAVENOUS | Status: DC
  Administered 2013-10-07 – 2013-10-09 (×3): via INTRAVENOUS

## 2013-10-07 MED ORDER — LEVOTHYROXINE SODIUM 50 MCG PO TABS
50.0000 ug | ORAL_TABLET | Freq: Every morning | ORAL | Status: DC
  Administered 2013-10-08 – 2013-10-10 (×2): 50 ug via ORAL
  Filled 2013-10-07 (×3): qty 1

## 2013-10-07 MED ORDER — ACETAMINOPHEN 500 MG PO TABS: 500.0000 mg | ORAL_TABLET | ORAL | Status: DC | PRN

## 2013-10-07 MED ORDER — BUDESONIDE-FORMOTEROL FUMARATE 160-4.5 MCG/ACT IN AERO
2.0000 | INHALATION_SPRAY | Freq: Two times a day (BID) | RESPIRATORY_TRACT | Status: DC
  Administered 2013-10-07 – 2013-10-10 (×6): 2 via RESPIRATORY_TRACT
  Filled 2013-10-07: qty 6

## 2013-10-07 MED ORDER — ALPRAZOLAM 0.25 MG PO TABS: 0.2500 mg | ORAL_TABLET | Freq: Two times a day (BID) | ORAL | Status: DC | PRN

## 2013-10-07 MED ORDER — DOXYCYCLINE HYCLATE 100 MG PO TABS
100.0000 mg | ORAL_TABLET | Freq: Two times a day (BID) | ORAL | Status: DC
Start: 2013-10-07 — End: 2013-10-08
  Administered 2013-10-07: 100 mg via ORAL
  Filled 2013-10-07 (×3): qty 1

## 2013-10-07 MED ORDER — DEXTROSE 5 % IV SOLN
1.0000 g | INTRAVENOUS | Status: DC
  Administered 2013-10-07: 1 g via INTRAVENOUS
  Filled 2013-10-07: qty 10

## 2013-10-07 MED ORDER — ASPIRIN 300 MG RE SUPP
300.0000 mg | Freq: Every day | RECTAL | Status: DC
  Administered 2013-10-07 – 2013-10-09 (×2): 300 mg via RECTAL
  Filled 2013-10-07 (×4): qty 1

## 2013-10-07 MED ORDER — ENOXAPARIN SODIUM 30 MG/0.3ML ~~LOC~~ SOLN
30.0000 mg | SUBCUTANEOUS | Status: DC
  Administered 2013-10-07 – 2013-10-08 (×2): 30 mg via SUBCUTANEOUS
  Filled 2013-10-07 (×3): qty 0.3

## 2013-10-07 MED ORDER — AMITRIPTYLINE HCL 50 MG PO TABS
50.0000 mg | ORAL_TABLET | Freq: Every day | ORAL | Status: DC
  Administered 2013-10-07 – 2013-10-09 (×3): 50 mg via ORAL
  Filled 2013-10-07 (×5): qty 1

## 2013-10-07 MED ORDER — ATORVASTATIN CALCIUM 40 MG PO TABS
40.0000 mg | ORAL_TABLET | Freq: Every day | ORAL | Status: DC
  Administered 2013-10-07 – 2013-10-09 (×3): 40 mg via ORAL
  Filled 2013-10-07 (×4): qty 1

## 2013-10-07 MED ORDER — INSULIN ASPART 100 UNIT/ML ~~LOC~~ SOLN
0.0000 [IU] | Freq: Three times a day (TID) | SUBCUTANEOUS | Status: DC
  Administered 2013-10-08: 2 [IU] via SUBCUTANEOUS
  Administered 2013-10-08: 5 [IU] via SUBCUTANEOUS
  Administered 2013-10-09 – 2013-10-10 (×3): 3 [IU] via SUBCUTANEOUS

## 2013-10-07 MED ORDER — INSULIN ASPART 100 UNIT/ML ~~LOC~~ SOLN
0.0000 [IU] | Freq: Every day | SUBCUTANEOUS | Status: DC
  Administered 2013-10-07: 4 [IU] via SUBCUTANEOUS
  Administered 2013-10-08: 2 [IU] via SUBCUTANEOUS
  Administered 2013-10-09: 3 [IU] via SUBCUTANEOUS

## 2013-10-07 MED ORDER — HYDROCODONE-ACETAMINOPHEN 5-325 MG PO TABS
1.0000 | ORAL_TABLET | Freq: Three times a day (TID) | ORAL | Status: DC | PRN
  Administered 2013-10-07 – 2013-10-08 (×2): 1 via ORAL
  Filled 2013-10-07 (×2): qty 1

## 2013-10-07 NOTE — Consult Note (Signed)
Referring Physician: ED    Chief Complaint: CODE STROKE: LEFT HEMIPARESIS, LEFT FACE WEAKNESS, DYSARTHRIA  HPI:                                                                                                                                         [name] is an 79 y.o. female with a past medical history significant for HTN, DM type II, CAD, PAD, CKD, COPD, bilateral macular degeneration, and hypothyroidism, brought in by ambulance as a code stroke due to acute onset of the above stated symptoms. She denies ever having similar symptoms before, but this morning around 10 am she was noted to have slurred speech and left face and left sided weakness. She said that she has been feeling weak for the past 2-3 weeks and said that the only thing different today is " my slurred speech". Denies HA, vertigo, double vision, difficulty swallowing, but complains of numbness left side. No chest pain or palpitations. Date last known well: 10/07/13 Time last known well: 10 am  tPA Given: no, low NIHSS (in reality, her NIHSS is lower because both legs weakness are chronically weak) NIHSS: 6   Past Medical History  Diagnosis Date  . Hypertension   . Macular degeneration, bilateral   . Type II diabetes mellitus   . COPD (chronic obstructive pulmonary disease)   . Diabetic peripheral neuropathy   . Hypercholesteremia   . PAD (peripheral artery disease)   . Chronic kidney disease   . Headache(784.0)   . Hypothyroid   . Coronary artery disease     Past Surgical History  Procedure Laterality Date  . Tonsillectomy  1940's  . Abdominal hysterectomy  1970's  . Dilation and curettage of uterus  1970's    "probably 2" (11/01/2012)  . Cataract extraction w/ intraocular lens  implant, bilateral Bilateral ~ 2010  . Bypass graft Right ~ 1997    RLE by Dr. Gwenlyn Perking 11/29/2005 (11/01/2012)  . Cystostomy w/ bladder biopsy  2005    Archie Endo 09/19/2003 (11/01/2012)  . Cardiac catheterization  11/29/2005    Archie Endo  11/29/2005 (11/01/2012)  . Anterior cervical decomp/discectomy fusion  01/02/2006    Archie Endo 01/02/2006 (11/01/2012)  . Shoulder open rotator cuff repair Bilateral 1990's-2000's    "3X on the left; twice on the right" (11/01/2012)    No family history on file. Social History:  reports that she has been smoking Cigarettes.  She has a 7.2 pack-year smoking history. She has never used smokeless tobacco. She reports that she does not drink alcohol or use illicit drugs.  Allergies:  Allergies  Allergen Reactions  . Codeine Other (See Comments)    unknown  . Lisinopril Other (See Comments)    unknown  . Penicillins Hives  . Septra [Sulfamethoxazole-Trimethoprim]   . Sulfur Hives and Rash    Break out     Medications:  Scheduled:  ROS:                                                                                                                                       History obtained from the patient and chart review  General ROS: negative for - chills, fatigue, fever, night sweats, weight gain or weight loss Psychological ROS: negative for - behavioral disorder, hallucinations, memory difficulties, mood swings or suicidal ideation Ophthalmic ROS: negative for - blurry vision, double vision, eye pain or loss of vision ENT ROS: negative for - epistaxis, nasal discharge, oral lesions, sore throat, tinnitus or vertigo Allergy and Immunology ROS: negative for - hives or itchy/watery eyes Hematological and Lymphatic ROS: negative for - bleeding problems, bruising or swollen lymph nodes Endocrine ROS: negative for - galactorrhea, hair pattern changes, polydipsia/polyuria or temperature intolerance Respiratory ROS: negative for - cough, hemoptysis, shortness of breath or wheezing Cardiovascular ROS: negative for - chest pain, dyspnea on exertion, edema or irregular  heartbeat Gastrointestinal ROS: negative for - abdominal pain, diarrhea, hematemesis, nausea/vomiting or stool incontinence Genito-Urinary ROS: negative for - dysuria, hematuria, incontinence or urinary frequency/urgency Musculoskeletal ROS: negative for - joint swelling or muscular weakness Neurological ROS: as noted in HPI Dermatological ROS: negative for rash and skin lesion changes  Physical exam: pleasant female in no apparent distress. Blood pressure 129/44, pulse 52, temperature 98.2 F (36.8 C), temperature source Oral, resp. rate 15, SpO2 94.00%. Head: normocephalic. Neck: supple, no bruits, no JVD. Cardiac: no murmurs. Lungs: clear. Abdomen: soft, no tender, no mass. Extremities: no edema. Neurologic Examination:                                                                                                      Mental Status: Alert, oriented, thought content appropriate.  Mild dysarthria without evidence of aphasia.  Able to follow 3 step commands without difficulty. Cranial Nerves: II: Discs flat bilaterally; Visual fields grossly normal, pupils equal, round, reactive to light and accommodation III,IV, VI: ptosis not present, extra-ocular motions intact bilaterally V,VII: smile symmetric, facial light touch sensation normal bilaterally VIII: hearing normal bilaterally IX,X: gag reflex present XI: bilateral shoulder shrug XII: midline tongue extension without atrophy or fasciculations Motor: Significant for bilateral LE weakness. Tone and bulk:normal tone throughout; no atrophy noted Sensory: Pinprick and light touch diminished in the left side. Deep Tendon Reflexes:  1 all over Plantars: No tested. Cerebellar: normal finger-to-nose,  Patient can not perform heel-to-shin test due to weakness Gait: No  tested    Results for orders placed during the hospital encounter of 10/07/13 (from the past 48 hour(s))  CBC     Status: Abnormal   Collection Time    10/07/13  12:31 PM      Result Value Ref Range   WBC 14.2 (*) 4.0 - 10.5 K/uL   RBC 3.53 (*) 3.87 - 5.11 MIL/uL   Hemoglobin 10.2 (*) 12.0 - 15.0 g/dL   HCT 32.1 (*) 36.0 - 46.0 %   MCV 90.9  78.0 - 100.0 fL   MCH 28.9  26.0 - 34.0 pg   MCHC 31.8  30.0 - 36.0 g/dL   RDW 13.4  11.5 - 15.5 %   Platelets 188  150 - 400 K/uL  DIFFERENTIAL     Status: Abnormal   Collection Time    10/07/13 12:31 PM      Result Value Ref Range   Neutrophils Relative % 66  43 - 77 %   Neutro Abs 9.3 (*) 1.7 - 7.7 K/uL   Lymphocytes Relative 23  12 - 46 %   Lymphs Abs 3.2  0.7 - 4.0 K/uL   Monocytes Relative 10  3 - 12 %   Monocytes Absolute 1.4 (*) 0.1 - 1.0 K/uL   Eosinophils Relative 1  0 - 5 %   Eosinophils Absolute 0.2  0.0 - 0.7 K/uL   Basophils Relative 0  0 - 1 %   Basophils Absolute 0.0  0.0 - 0.1 K/uL  I-STAT CHEM 8, ED     Status: Abnormal   Collection Time    10/07/13  1:03 PM      Result Value Ref Range   Sodium 139  137 - 147 mEq/L   Potassium 4.8  3.7 - 5.3 mEq/L   Chloride 102  96 - 112 mEq/L   BUN 42 (*) 6 - 23 mg/dL   Creatinine, Ser 1.90 (*) 0.50 - 1.10 mg/dL   Glucose, Bld 379 (*) 70 - 99 mg/dL   Calcium, Ion 1.28  1.13 - 1.30 mmol/L   TCO2 24  0 - 100 mmol/L   Hemoglobin 11.6 (*) 12.0 - 15.0 g/dL   HCT 34.0 (*) 36.0 - 46.0 %   Ct Head Wo Contrast  10/07/2013   CLINICAL DATA:  Code stroke.  Left-sided weakness.  Slurred speech.  EXAM: CT HEAD WITHOUT CONTRAST  TECHNIQUE: Contiguous axial images were obtained from the base of the skull through the vertex without intravenous contrast.  COMPARISON:  CT scan dated 10/31/2012  FINDINGS: There is no acute intracranial hemorrhage, infarction, or mass lesion. There are subtle old periventricular white matter lucencies consistent with small vessel ischemic disease. There is diffuse slight cerebral cortical atrophy with dilatation of the third and lateral ventricles which is stable. No osseous abnormality.  IMPRESSION: No acute intracranial  abnormality.   Electronically Signed   By: Rozetta Nunnery M.D.   On: 10/07/2013 12:46     Assessment: 79 y.o. female with probable right brain infarct, perhaps subcortical. Did not offer treatment with thrombolysis because her NIHSS is confounded by chronic bilateral LE weakness and in reality she has NIHSS<5. Will admit to medicine and complete stroke work up. Stroke team will follow up tomorrow.   Stroke Risk Factors - AGE, HTN, DM type II, CAD  Plan: 1. HgbA1c, fasting lipid panel 2. MRI, MRA  of the brain without contrast 3. Echocardiogram 4. Carotid dopplers 5. Prophylactic therapy-aspirin 6. Risk factor modification 7. Telemetry monitoring 8. Frequent  neuro checks 9. PT/OT SLP  Dorian Pod, MD [facility] [phone]  10/07/2013, 1:07 PM

## 2013-10-07 NOTE — ED Notes (Signed)
Pt still in MRI 

## 2013-10-07 NOTE — ED Provider Notes (Signed)
CSN: [id number]     Arrival date & time 10/07/13  1225 History   First MD Initiated Contact with Patient 10/07/13 1229     Chief Complaint  Patient presents with  . Code Stroke     HPI [name] is an 79 y.o. female with a past medical history significant for HTN, DM type II, CAD, PAD, CKD, COPD, bilateral macular degeneration, and hypothyroidism, brought in by ambulance as a code stroke due to acute onset of the above stated symptoms.  She denies ever having similar symptoms before, but this morning around 10 am she was noted to have slurred speech and left face and left sided weakness. She said that she has been feeling weak for the past 2-3 weeks and said that the only thing different today is " my slurred speech".  Denies HA, vertigo, double vision, difficulty swallowing, but complains of numbness left side. No chest pain or palpitations   Past Medical History  Diagnosis Date  . Hypertension   . Macular degeneration, bilateral   . Type II diabetes mellitus   . COPD (chronic obstructive pulmonary disease)   . Diabetic peripheral neuropathy   . Hypercholesteremia   . PAD (peripheral artery disease)   . Chronic kidney disease   . Headache(784.0)   . Hypothyroid   . Coronary artery disease    Past Surgical History  Procedure Laterality Date  . Tonsillectomy  1940's  . Abdominal hysterectomy  1970's  . Dilation and curettage of uterus  1970's    "probably 2" (11/01/2012)  . Cataract extraction w/ intraocular lens  implant, bilateral Bilateral ~ 2010  . Bypass graft Right ~ 1997    RLE by Dr. Gwenlyn Perking 11/29/2005 (11/01/2012)  . Cystostomy w/ bladder biopsy  2005    Archie Endo 09/19/2003 (11/01/2012)  . Cardiac catheterization  11/29/2005    Archie Endo 11/29/2005 (11/01/2012)  . Anterior cervical decomp/discectomy fusion  01/02/2006    Archie Endo 01/02/2006 (11/01/2012)  . Shoulder open rotator cuff repair Bilateral 1990's-2000's    "3X on the left; twice on the right" (11/01/2012)   No  family history on file. History  Substance Use Topics  . Smoking status: Current Every Day Smoker -- 0.12 packs/day for 60 years    Types: Cigarettes  . Smokeless tobacco: Never Used  . Alcohol Use: No   OB History   Grav Para Term Preterm Abortions TAB SAB Ect Mult Living                 Review of Systems  Unable to perform ROS: Acuity of condition      Allergies  Codeine; Lisinopril; Penicillins; Septra; and Sulfur  Home Medications   Prior to Admission medications   Medication Sig Start Date End Date Taking? Authorizing Provider  acetaminophen (TYLENOL) 500 MG tablet Take 500 mg by mouth every 4 (four) hours as needed for mild pain, fever or headache.    Historical Provider, MD  albuterol (PROVENTIL) (2.5 MG/3ML) 0.083% nebulizer solution Take 2.5 mg by nebulization every 6 (six) hours as needed for wheezing.    Historical Provider, MD  ALPRAZolam Duanne Moron) 0.25 MG tablet Take 0.25 mg by mouth 2 (two) times daily as needed for anxiety.    Historical Provider, MD  amitriptyline (ELAVIL) 50 MG tablet Take 50 mg by mouth at bedtime.    Historical Provider, MD  amLODipine (NORVASC) 10 MG tablet Take 10 mg by mouth every morning.     Historical Provider, MD  aspirin 81 MG chewable  tablet Chew 81 mg by mouth every morning.     Historical Provider, MD  carvedilol (COREG) 25 MG tablet Take 25 mg by mouth 2 (two) times daily with a meal. 0800 and 1700    Historical Provider, MD  cloNIDine (CATAPRES) 0.2 MG tablet Take 0.2 mg by mouth 2 (two) times daily.    Historical Provider, MD  Fluticasone-Salmeterol (ADVAIR) 100-50 MCG/DOSE AEPB Inhale 1 puff into the lungs every 12 (twelve) hours.    Historical Provider, MD  furosemide (LASIX) 40 MG tablet Take 40 mg by mouth daily.    Historical Provider, MD  gabapentin (NEURONTIN) 100 MG capsule Take 100-300 mg by mouth 3 (three) times daily. 1 capsule at 0800, 1 capsule at 1400, and 3 capsules at 2000.    Historical Provider, MD  guaiFENesin  (ROBITUSSIN) 100 MG/5ML SOLN Take 10 mLs by mouth every 4 (four) hours as needed for cough or to loosen phlegm.    Historical Provider, MD  HYDROcodone-acetaminophen (NORCO/VICODIN) 5-325 MG per tablet Take 1 tablet by mouth every 8 (eight) hours as needed for moderate pain or severe pain. 06/24/13   Hosie Poisson, MD  ipratropium (ATROVENT) 0.02 % nebulizer solution Take 0.5 mg by nebulization every 6 (six) hours as needed for wheezing or shortness of breath.    Historical Provider, MD  levofloxacin (LEVAQUIN) 500 MG tablet Take 1 tablet (500 mg total) by mouth every other day. 06/24/13   Hosie Poisson, MD  levothyroxine (SYNTHROID, LEVOTHROID) 50 MCG tablet Take 50 mcg by mouth every morning.     Historical Provider, MD  Multiple Vitamins-Minerals (HEALTHY EYES) TABS Take 1 tablet by mouth every morning.     Historical Provider, MD  Ostomy Supplies (SKIN PREP WIPES) MISC Apply 1 application topically 2 (two) times daily.    Historical Provider, MD  polyethylene glycol (MIRALAX / GLYCOLAX) packet Take 51 g by mouth daily as needed for mild constipation, moderate constipation or severe constipation.    Historical Provider, MD  rosuvastatin (CRESTOR) 20 MG tablet Take 20 mg by mouth every evening.     Historical Provider, MD  senna-docusate (SENOKOT-S) 8.6-50 MG per tablet Take 1 tablet by mouth at bedtime as needed for mild constipation or moderate constipation.    Historical Provider, MD  sertraline (ZOLOFT) 50 MG tablet Take 50 mg by mouth daily.    Historical Provider, MD   BP 169/73  Pulse 71  Temp(Src) 98.3 F (36.8 C) (Oral)  Resp 20  Ht 5\' 3"  (1.6 m)  Wt 188 lb 15 oz (85.7 kg)  BMI 33.48 kg/m2  SpO2 98% Physical Exam  Nursing note and vitals reviewed. Constitutional: She is oriented to person, place, and time. She appears well-developed and well-nourished. No distress.  HENT:  Head: Normocephalic and atraumatic.  Eyes: Pupils are equal, round, and reactive to light.  Neck: Normal range of  motion.  Cardiovascular: Normal rate and intact distal pulses.   Pulmonary/Chest: No respiratory distress.  Abdominal: Normal appearance. She exhibits no distension.  Musculoskeletal: Normal range of motion.  Neurological: She is alert and oriented to person, place, and time. No cranial nerve deficit. GCS eye subscore is 4. GCS verbal subscore is 5. GCS motor subscore is 6.  Neurologic Examination:  Mental Status: Alert, oriented, thought content appropriate.  Mild dysarthria without evidence of aphasia.  Able to follow 3 step commands without difficulty. Cranial Nerves: II: Discs flat bilaterally; Visual fields grossly normal, pupils equal, round, reactive to light and accommodation III,IV, VI: ptosis not present, extra-ocular motions intact bilaterally V,VII: smile symmetric, facial light touch sensation normal bilaterally VIII: hearing normal bilaterally IX,X: gag reflex present XI: bilateral shoulder shrug XII: midline tongue extension without atrophy or fasciculations Motor: Significant for bilateral LE weakness. Tone and bulk:normal tone throughout; no atrophy noted Sensory: Pinprick and light touch diminished in the left side. Deep Tendon Reflexes:   1 all over Plantars: No tested. Cerebellar: normal finger-to-nose,  Patient can not perform heel-to-shin test due to weakness Gait: No tested  Skin: Skin is warm and dry. No rash noted.  Psychiatric: She has a normal mood and affect. Her behavior is normal.    ED Course  Procedures (including critical care time)  CRITICAL CARE Performed by: Dot Lanes Total critical care time: 30 min Critical care time was exclusive of separately billable procedures and treating other patients. Critical care was necessary to treat or prevent imminent or life-threatening deterioration. Critical care was time spent personally by me on the  following activities: development of treatment plan with patient and/or surrogate as well as nursing, discussions with consultants, evaluation of patient's response to treatment, examination of patient, obtaining history from patient or surrogate, ordering and performing treatments and interventions, ordering and review of laboratory studies, ordering and review of radiographic studies, pulse oximetry and re-evaluation of patient's condition.  Labs Review Labs Reviewed  CBC - Abnormal; Notable for the following:    WBC 14.2 (*)    RBC 3.53 (*)    Hemoglobin 10.2 (*)    HCT 32.1 (*)    All other components within normal limits  DIFFERENTIAL - Abnormal; Notable for the following:    Neutro Abs 9.3 (*)    Monocytes Absolute 1.4 (*)    All other components within normal limits  COMPREHENSIVE METABOLIC PANEL - Abnormal; Notable for the following:    Sodium 136 (*)    Glucose, Bld 380 (*)    BUN 44 (*)    Creatinine, Ser 1.81 (*)    Albumin 2.8 (*)    GFR calc non Af Amer 26 (*)    GFR calc Af Amer 30 (*)    All other components within normal limits  URINE RAPID DRUG SCREEN (HOSP PERFORMED) - Abnormal; Notable for the following:    Opiates POSITIVE (*)    All other components within normal limits  URINALYSIS, ROUTINE W REFLEX MICROSCOPIC - Abnormal; Notable for the following:    APPearance TURBID (*)    Glucose, UA 250 (*)    Hgb urine dipstick SMALL (*)    Protein, ur 100 (*)    Leukocytes, UA LARGE (*)    All other components within normal limits  HEMOGLOBIN A1C - Abnormal; Notable for the following:    Hemoglobin A1C 9.8 (*)    Mean Plasma Glucose 235 (*)    All other components within normal limits  LIPID PANEL - Abnormal; Notable for the following:    Triglycerides 160 (*)    HDL 33 (*)    All other components within normal limits  URINE MICROSCOPIC-ADD ON - Abnormal; Notable for the following:    Bacteria, UA MANY (*)    All other components within normal limits  GLUCOSE,  CAPILLARY - Abnormal; Notable for the  following:    Glucose-Capillary 274 (*)    All other components within normal limits  HEMOGLOBIN A1C - Abnormal; Notable for the following:    Hemoglobin A1C 9.9 (*)    Mean Plasma Glucose 237 (*)    All other components within normal limits  GLUCOSE, CAPILLARY - Abnormal; Notable for the following:    Glucose-Capillary 303 (*)    All other components within normal limits  GLUCOSE, CAPILLARY - Abnormal; Notable for the following:    Glucose-Capillary 136 (*)    All other components within normal limits  CBC - Abnormal; Notable for the following:    RBC 3.15 (*)    Hemoglobin 9.2 (*)    HCT 29.0 (*)    All other components within normal limits  BASIC METABOLIC PANEL - Abnormal; Notable for the following:    Glucose, Bld 129 (*)    BUN 34 (*)    Creatinine, Ser 1.38 (*)    GFR calc non Af Amer 36 (*)    GFR calc Af Amer 42 (*)    All other components within normal limits  GLUCOSE, CAPILLARY - Abnormal; Notable for the following:    Glucose-Capillary 211 (*)    All other components within normal limits  GLUCOSE, CAPILLARY - Abnormal; Notable for the following:    Glucose-Capillary 206 (*)    All other components within normal limits  GLUCOSE, CAPILLARY - Abnormal; Notable for the following:    Glucose-Capillary 163 (*)    All other components within normal limits  CBC - Abnormal; Notable for the following:    WBC 11.4 (*)    RBC 3.74 (*)    Hemoglobin 10.9 (*)    HCT 33.9 (*)    All other components within normal limits  GLUCOSE, CAPILLARY - Abnormal; Notable for the following:    Glucose-Capillary 152 (*)    All other components within normal limits  GLUCOSE, CAPILLARY - Abnormal; Notable for the following:    Glucose-Capillary 171 (*)    All other components within normal limits  GLUCOSE, CAPILLARY - Abnormal; Notable for the following:    Glucose-Capillary 65 (*)    All other components within normal limits  I-STAT CHEM 8, ED -  Abnormal; Notable for the following:    BUN 42 (*)    Creatinine, Ser 1.90 (*)    Glucose, Bld 379 (*)    Hemoglobin 11.6 (*)    HCT 34.0 (*)    All other components within normal limits  CBG MONITORING, ED - Abnormal; Notable for the following:    Glucose-Capillary 315 (*)    All other components within normal limits  ETHANOL  PROTIME-INR  APTT  GLUCOSE, CAPILLARY  GLUCOSE, CAPILLARY  GLUCOSE, CAPILLARY  URINALYSIS, ROUTINE W REFLEX MICROSCOPIC  URINE RAPID DRUG SCREEN (HOSP PERFORMED)  I-STAT TROPOININ, ED  I-STAT TROPOININ, ED   Patient was seen and evaluated emergency department by neurology.  CRITICAL CARE Performed by: Dot Lanes Total critical care time: 30 min Critical care time was exclusive of separately billable procedures and treating other patients. Critical care was necessary to treat or prevent imminent or life-threatening deterioration. Critical care was time spent personally by me on the following activities: development of treatment plan with patient and/or surrogate as well as nursing, discussions with consultants, evaluation of patient's response to treatment, examination of patient, obtaining history from patient or surrogate, ordering and performing treatments and interventions, ordering and review of laboratory studies, ordering and review of radiographic studies, pulse  oximetry and re-evaluation of patient's condition.  Imaging Review Dg Foot 2 Views Left  10/09/2013   CLINICAL DATA:  Lateral left heel wound that has not healed for 8 months. Diabetes.  EXAM: LEFT FOOT - 2 VIEW  COMPARISON:  03/18/2013  FINDINGS: The bones are diffusely osteopenic. No acute fracture or dislocation is seen. No osseous erosion or dissecting soft tissue emphysema is identified. Soft tissue swelling about the ankle has decreased from the prior study. Diffuse vascular calcification is present.  IMPRESSION: Osteopenia.  No radiographic evidence active osteomyelitis.    Electronically Signed   By: Logan Bores   On: 10/09/2013 11:19     EKG Interpretation   Date/Time:  Monday Oct 07 2013 12:45:43 EDT Ventricular Rate:  69 PR Interval:  265 QRS Duration: 148 QT Interval:  456 QTC Calculation: 489 R Axis:   -56 Text Interpretation:  Sinus or ectopic atrial rhythm Prolonged PR interval  Consider left atrial enlargement Right bundle branch block No significant  change since last tracing Confirmed by [place]  MD, Lyrique Hakim ([id number]) on  10/07/2013 12:50:19 PM      MDM   Final diagnoses:  TIA (transient ischemic attack)        Dot Lanes, MD 10/10/13 [phone]

## 2013-10-07 NOTE — ED Notes (Signed)
Pt LSN at 1000 this morning. Pt having slurred speech and left sided weakness. EMS noted that left sided weakness seemed to normalized en route. Pt alert and oriented. CBG 350 from EMS, BP 116/50, Sinus tach 116.

## 2013-10-07 NOTE — ED Notes (Signed)
Spoke with pt's Dtr Deneen Harts [phone]) and informed her that pt will be admitted.

## 2013-10-07 NOTE — ED Notes (Signed)
Attempted to call report x 1  

## 2013-10-07 NOTE — ED Notes (Signed)
Patient transported to MRI 

## 2013-10-07 NOTE — ED Notes (Signed)
Called and informed pt's dtr that pt will be going to [id number] when she returns from MRI

## 2013-10-07 NOTE — H&P (Addendum)
History and Physical       Hospital Admission Note Date: 10/07/2013  Patient name: [name] Medical record number: [id number] Date of birth: 1935-03-25 Age: 79 y.o. Gender: female  PCP: Mayra Neer, MD    Chief Complaint:  Patient brought as code stroke from skilled nursing facility, slurred speech, facial drooping and left-sided weakness  HPI: Patient is a 79 year old female with history of hypertension, diabetes, insulin-dependent, cardiac disease, CAD, CKD stage III, COPD, hypothyroidism, currently resident of [facility] was brought in via EMS as code stroke due to slurred speech, facial droop and left-sided weakness noticed around 10 AM by the facility staff. Patient's symptoms apparently had resolved enroute to the ER. At the time of my encounter, patient is minimally slurred although weakness of the left side has improved. She was seen by neurology and not considered a TPA candidate as her symptoms have improved. Patient denied any headache, visual blurring, any chest pain or palpitations. She does have cough but no fevers chills or any phlegm. She did mention numbness of the left side.  Review of Systems:  Constitutional: Denies fever, chills, diaphoresis, poor appetite and fatigue.  HEENT: Denies photophobia, eye pain, redness, hearing loss, ear pain, congestion, sore throat, rhinorrhea, sneezing, mouth sores, trouble swallowing, neck pain, neck stiffness and tinnitus.   Respiratory: Denies SOB, DOE, cough, chest tightness,  and wheezing.   Cardiovascular: Denies chest pain, palpitations and leg swelling.  Gastrointestinal: Denies nausea, vomiting, abdominal pain, diarrhea, constipation, blood in stool and abdominal distention.  Genitourinary: Denies dysuria, urgency, frequency, hematuria, flank pain and difficulty urinating.  Musculoskeletal: Denies myalgias, back pain, joint swelling, arthralgias and gait  problem.  Skin: Denies pallor, rash and wound.  Neurological: Please see history of present illness Hematological: Denies adenopathy. Easy bruising, personal or family bleeding history  Psychiatric/Behavioral: Denies suicidal ideation, mood changes, confusion, nervousness, sleep disturbance and agitation  Past Medical History: Past Medical History  Diagnosis Date  . Hypertension   . Macular degeneration, bilateral   . Type II diabetes mellitus   . COPD (chronic obstructive pulmonary disease)   . Diabetic peripheral neuropathy   . Hypercholesteremia   . PAD (peripheral artery disease)   . Chronic kidney disease   . Headache(784.0)   . Hypothyroid   . Coronary artery disease    Past Surgical History  Procedure Laterality Date  . Tonsillectomy  1940's  . Abdominal hysterectomy  1970's  . Dilation and curettage of uterus  1970's    "probably 2" (11/01/2012)  . Cataract extraction w/ intraocular lens  implant, bilateral Bilateral ~ 2010  . Bypass graft Right ~ 1997    RLE by Dr. Gwenlyn Perking 11/29/2005 (11/01/2012)  . Cystostomy w/ bladder biopsy  2005    Archie Endo 09/19/2003 (11/01/2012)  . Cardiac catheterization  11/29/2005    Archie Endo 11/29/2005 (11/01/2012)  . Anterior cervical decomp/discectomy fusion  01/02/2006    Archie Endo 01/02/2006 (11/01/2012)  . Shoulder open rotator cuff repair Bilateral 1990's-2000's    "3X on the left; twice on the right" (11/01/2012)    Medications: Prior to Admission medications   Medication Sig Start Date End Date Taking? Authorizing Provider  acetaminophen (TYLENOL) 500 MG tablet Take 500 mg by mouth every 4 (four) hours as needed for mild pain, fever or headache.   Yes Historical Provider, MD  albuterol (PROVENTIL) (2.5 MG/3ML) 0.083% nebulizer solution Take 2.5 mg by nebulization every 6 (six) hours as needed for wheezing.   Yes Historical Provider, MD  ALPRAZolam Duanne Moron) 0.25  MG tablet Take 0.25 mg by mouth 2 (two) times daily as needed for anxiety.   Yes  Historical Provider, MD  amitriptyline (ELAVIL) 50 MG tablet Take 50 mg by mouth at bedtime.   Yes Historical Provider, MD  amLODipine (NORVASC) 10 MG tablet Take 10 mg by mouth every morning.    Yes Historical Provider, MD  aspirin 81 MG chewable tablet Chew 81 mg by mouth every morning.    Yes Historical Provider, MD  budesonide-formoterol (SYMBICORT) 160-4.5 MCG/ACT inhaler Inhale 2 puffs into the lungs 2 (two) times daily.   Yes Historical Provider, MD  carvedilol (COREG) 25 MG tablet Take 25 mg by mouth 2 (two) times daily with a meal. 0800 and 1700   Yes Historical Provider, MD  cloNIDine (CATAPRES) 0.2 MG tablet Take 0.2 mg by mouth 2 (two) times daily. Hold if blood pressure less than 100/60   Yes Historical Provider, MD  furosemide (LASIX) 40 MG tablet Take 40 mg by mouth daily.   Yes Historical Provider, MD  gabapentin (NEURONTIN) 100 MG capsule Take 100-300 mg by mouth 3 (three) times daily. 1 capsule at 0800, 1 capsule at 1400, and 3 capsules at 2000.   Yes Historical Provider, MD  guaiFENesin (ROBITUSSIN) 100 MG/5ML SOLN Take 10 mLs by mouth every 4 (four) hours as needed for cough or to loosen phlegm.   Yes Historical Provider, MD  HYDROcodone-acetaminophen (NORCO/VICODIN) 5-325 MG per tablet Take 1 tablet by mouth every 8 (eight) hours as needed for moderate pain or severe pain. 06/24/13  Yes Hosie Poisson, MD  insulin glargine (LANTUS) 100 UNIT/ML injection Inject 35 Units into the skin at bedtime.   Yes Historical Provider, MD  insulin lispro (HUMALOG) 100 UNIT/ML injection Inject 0-14 Units into the skin 3 (three) times daily before meals. CBG greater than 350=14 units   Yes Historical Provider, MD  levothyroxine (SYNTHROID, LEVOTHROID) 50 MCG tablet Take 50 mcg by mouth every morning.    Yes Historical Provider, MD  Multiple Vitamins-Minerals (HEALTHY EYES) TABS Take 1 tablet by mouth every morning.    Yes Historical Provider, MD  polyethylene glycol (MIRALAX / GLYCOLAX) packet Take 51  g by mouth daily as needed for mild constipation, moderate constipation or severe constipation.   Yes Historical Provider, MD  rosuvastatin (CRESTOR) 20 MG tablet Take 20 mg by mouth every evening.    Yes Historical Provider, MD  senna-docusate (SENOKOT-S) 8.6-50 MG per tablet Take 2 tablets by mouth at bedtime.    Yes Historical Provider, MD  tiotropium (SPIRIVA) 18 MCG inhalation capsule Place 18 mcg into inhaler and inhale daily.   Yes Historical Provider, MD    Allergies:   Allergies  Allergen Reactions  . Codeine Other (See Comments)    unknown  . Lisinopril Other (See Comments)    unknown  . Penicillins Hives  . Septra [Sulfamethoxazole-Trimethoprim]   . Sulfur Hives and Rash    Break out     Social History:  reports that she has been smoking Cigarettes.  She has a 7.2 pack-year smoking history. She has never used smokeless tobacco. She reports that she does not drink alcohol or use illicit drugs.  Family History: No family history on file.  Physical Exam: Blood pressure 125/41, pulse 54, temperature 98.2 F (36.8 C), temperature source Oral, resp. rate 14, SpO2 94.00%. General: Alert, awake, oriented x3, in no acute distress, pleasant, appears to have mild dysarthria. HEENT: normocephalic, atraumatic, anicteric sclera, pink conjunctiva, pupils equal and reactive to light  and accomodation, oropharynx clear Neck: supple, no masses or lymphadenopathy, no goiter, no bruits  Heart: Regular rate and rhythm, without murmurs, rubs or gallops. Lungs: Clear to auscultation bilaterally, no wheezing, rales or rhonchi. Abdomen: Soft, nontender, nondistended, positive bowel sounds, no masses. Extremities: No clubbing, cyanosis or edema with positive pedal pulses, left heel ulcer 2cm dia, with granulation tissue. Neuro: Cranial nerves 2-12 intact, bilateral lower extremity weakness symmetrical 4/5, upper extremity 5/5 bilaterally Psych: alert and oriented x 3, normal mood and  affect Skin: no rashes or lesions, warm and dry   LABS on Admission:  Basic Metabolic Panel:  Recent Labs Lab 10/07/13 1231 10/07/13 1303  NA 136* 139  K 4.9 4.8  CL 100 102  CO2 22  --   GLUCOSE 380* 379*  BUN 44* 42*  CREATININE 1.81* 1.90*  CALCIUM 9.2  --    Liver Function Tests:  Recent Labs Lab 10/07/13 1231  AST 10  ALT 8  ALKPHOS 61  BILITOT 0.3  PROT 6.4  ALBUMIN 2.8*   No results found for this basename: LIPASE, AMYLASE,  in the last 168 hours No results found for this basename: AMMONIA,  in the last 168 hours CBC:  Recent Labs Lab 10/07/13 1231 10/07/13 1303  WBC 14.2*  --   NEUTROABS 9.3*  --   HGB 10.2* 11.6*  HCT 32.1* 34.0*  MCV 90.9  --   PLT 188  --    Cardiac Enzymes: No results found for this basename: CKTOTAL, CKMB, CKMBINDEX, TROPONINI,  in the last 168 hours BNP: No components found with this basename: POCBNP,  CBG: No results found for this basename: GLUCAP,  in the last 168 hours   Radiological Exams on Admission: Ct Head Wo Contrast  10/07/2013   CLINICAL DATA:  Code stroke.  Left-sided weakness.  Slurred speech.  EXAM: CT HEAD WITHOUT CONTRAST  TECHNIQUE: Contiguous axial images were obtained from the base of the skull through the vertex without intravenous contrast.  COMPARISON:  CT scan dated 10/31/2012  FINDINGS: There is no acute intracranial hemorrhage, infarction, or mass lesion. There are subtle old periventricular white matter lucencies consistent with small vessel ischemic disease. There is diffuse slight cerebral cortical atrophy with dilatation of the third and lateral ventricles which is stable. No osseous abnormality.  IMPRESSION: No acute intracranial abnormality.   Electronically Signed   By: Rozetta Nunnery M.D.   On: 10/07/2013 12:46    Assessment/Plan Principal Problem:   TIA (transient ischemic attack): Presenting with slurred speech, left-sided weakness, facial droop which has improved -Patient's symptoms have  significantly improved, neurology was consulted and recommended CVA workup. CT of the head is negative for any acute CVA  -Obtain MRI, MRA of the brain,   2-D echo, carotid Dopplers  - PT, OT, ST consult, neurochecks  - Hemoglobin A1c, lipid panel  - Aspirin 325 mg daily  - Hold antihypertensives, will place on as needed hydralazine with parameters  Active Problems:   DM - Placed on sliding scale insulin, obtain hemoglobin A1c    COPD - Currently stable, continue albuterol nebs as needed    Weakness generalized - PTOT consult    CKD (chronic kidney disease) stage 3, GFR 30-59 ml/min - baseline creatinine appears to be 1.8-1.9, will hold Lasix today including other hypertensives. Provide gentle hydration    Ulcer of left heel - Wound care consult, place on doxycycline   DVT prophylaxis:   CODE STATUS:   Family Communication: no family member  is present at the bedside   Further plan will depend as patient's clinical course evolves and further radiologic and laboratory data become available.   Time Spent on Admission: 1 hour   Luie Laneve Krystal Eaton M.D. [facility] 10/07/2013, 2:03 PM Pager: [phone]  If 7PM-7AM, please contact night-coverage [email or web address] Password [email or web address]  **Disclaimer: This note was dictated with voice recognition software. Similar sounding words can inadvertently be transcribed and this note may contain transcription errors which may not have been corrected upon publication of note.**

## 2013-10-07 NOTE — Code Documentation (Signed)
79yo female arriving to [facility] via [facility] at 6.  Patient LSN at 1000 by her family when she was noticed to develop slurred speech, left sided weakness and facial droop.  Code Stroke called.  EMS reports slurred speech and slight left arm weakness compared to the right.  Patient was tachycardic and hypotensive with a CBG of 358 en route.  Patient has a h/o hypertension, DM, hypothyroidism, and high cholesterol.  Patient taken to CT on arrival.  NIHSS 6 on arrival, see documentation for details.  Patient reports generalized weakness over the past several weeks with worsening weakness in the last week.  The only change she has noticed this morning is a change in her speech.  She reports her vision is at baseline due to macular degeneration.  Weakness in her legs is bilateral.  She does c/o an ulcer on her left foot. Dr. Armida Sans at the bedside for assessment.  Patient is too mild to treat at this time.  Patient remains in the window for treatment with tPA should her symptoms worsen until 1430. Bedside handoff with ED RN Hope.

## 2013-10-07 NOTE — ED Notes (Signed)
Admitting MD at bedside.

## 2013-10-08 DIAGNOSIS — N183 Chronic kidney disease, stage 3 unspecified: Secondary | ICD-10-CM

## 2013-10-08 DIAGNOSIS — E119 Type 2 diabetes mellitus without complications: Secondary | ICD-10-CM

## 2013-10-08 DIAGNOSIS — I517 Cardiomegaly: Secondary | ICD-10-CM

## 2013-10-08 DIAGNOSIS — R5383 Other fatigue: Secondary | ICD-10-CM

## 2013-10-08 DIAGNOSIS — G459 Transient cerebral ischemic attack, unspecified: Secondary | ICD-10-CM | POA: Diagnosis not present

## 2013-10-08 DIAGNOSIS — N390 Urinary tract infection, site not specified: Secondary | ICD-10-CM

## 2013-10-08 DIAGNOSIS — L97409 Non-pressure chronic ulcer of unspecified heel and midfoot with unspecified severity: Secondary | ICD-10-CM

## 2013-10-08 DIAGNOSIS — R5381 Other malaise: Secondary | ICD-10-CM

## 2013-10-08 DIAGNOSIS — I10 Essential (primary) hypertension: Secondary | ICD-10-CM

## 2013-10-08 LAB — GLUCOSE, CAPILLARY
GLUCOSE-CAPILLARY: 211 mg/dL — AB (ref 70–99)
GLUCOSE-CAPILLARY: 206 mg/dL — AB (ref 70–99)
Glucose-Capillary: 93 mg/dL (ref 70–99)
Glucose-Capillary: 136 mg/dL — ABNORMAL HIGH (ref 70–99)

## 2013-10-08 LAB — LIPID PANEL
Cholesterol: 93 mg/dL (ref 0–200)
HDL: 33 mg/dL — AB (ref >39)
LDL CALC: 28 mg/dL (ref 0–99)
Total CHOL/HDL Ratio: 2.8 RATIO
Triglycerides: 160 mg/dL — ABNORMAL HIGH (ref <150)
VLDL: 32 mg/dL (ref 0–40)

## 2013-10-08 LAB — HEMOGLOBIN A1C
HEMOGLOBIN A1C: 9.9 % — AB (ref <5.7)
Mean Plasma Glucose: 237 mg/dL — ABNORMAL HIGH (ref <117)

## 2013-10-08 MED ORDER — DEXTROSE 5 % IV SOLN
1.0000 g | INTRAVENOUS | Status: DC
  Administered 2013-10-08 – 2013-10-10 (×3): 1 g via INTRAVENOUS
  Filled 2013-10-08 (×3): qty 10

## 2013-10-08 NOTE — Progress Notes (Signed)
Stroke Team Progress Note  HISTORY [name] is an 79 y.o. female with a past medical history significant for HTN, DM type II, CAD, PAD, CKD, COPD, bilateral macular degeneration, and hypothyroidism, brought in by ambulance as a code stroke due to LEFT HEMIPARESIS, LEFT FACE WEAKNESS, DYSARTHRIA. She denies ever having similar symptoms before, but the morning of 10/07/2013 around 10 am she was noted to have slurred speech and left face and left sided weakness. She said that she has been feeling weak for the past 2-3 weeks and said that the only thing different today is " my slurred speech". Denies HA, vertigo, double vision, difficulty swallowing, but complains of numbness left side. No chest pain or palpitations. Patient was not administered TPA secondary to low NIHSS. She was admitted for further evaluation and treatment.  SUBJECTIVE Her tech at the bedside.  Overall she feels her condition is stable. She complains of left knee pain  OBJECTIVE Most recent Vital Signs: Filed Vitals:   10/08/13 0220 10/08/13 0500 10/08/13 0552 10/08/13 0942  BP: 111/51  135/37 139/49  Pulse: 50  57 62  Temp: 98.8 F (37.1 C)  98.6 F (37 C) 99 F (37.2 C)  TempSrc: Oral  Oral Oral  Resp: 16  16 18   Height:  5\' 3"  (1.6 m)    Weight:  85.7 kg (188 lb 15 oz)    SpO2: 96%  95% 96%   CBG (last 3)   Recent Labs  10/07/13 2215 10/08/13 0649 10/08/13 1134  GLUCAP 303* 93 136*    IV Fluid Intake:   . sodium chloride 75 mL/hr at 10/08/13 0538    MEDICATIONS  . amitriptyline  50 mg Oral QHS  . aspirin  300 mg Rectal Daily  . aspirin  325 mg Oral Daily  . atorvastatin  40 mg Oral q1800  . budesonide-formoterol  2 puff Inhalation BID  . cefTRIAXone (ROCEPHIN)  IV  1 g Intravenous Q24H  . enoxaparin (LOVENOX) injection  30 mg Subcutaneous Q24H  . gabapentin  100 mg Oral BID WC  . gabapentin  300 mg Oral Q2000  . insulin aspart  0-15 Units Subcutaneous TID WC  . insulin aspart  0-5 Units  Subcutaneous QHS  . insulin glargine  35 Units Subcutaneous QHS  . levothyroxine  50 mcg Oral q morning - 10a  . senna-docusate  2 tablet Oral QHS   PRN:  acetaminophen, acetaminophen, albuterol, ALPRAZolam, HYDROcodone-acetaminophen  Diet:  Carb Control thin liquids Activity:   Bathroom privileges with assistance DVT Prophylaxis:  Lovenox 30 mg sq daily   CLINICALLY SIGNIFICANT STUDIES Basic Metabolic Panel:   Recent Labs Lab 10/07/13 1231 10/07/13 1303  NA 136* 139  K 4.9 4.8  CL 100 102  CO2 22  --   GLUCOSE 380* 379*  BUN 44* 42*  CREATININE 1.81* 1.90*  CALCIUM 9.2  --    Liver Function Tests:   Recent Labs Lab 10/07/13 1231  AST 10  ALT 8  ALKPHOS 61  BILITOT 0.3  PROT 6.4  ALBUMIN 2.8*   CBC:   Recent Labs Lab 10/07/13 1231 10/07/13 1303  WBC 14.2*  --   NEUTROABS 9.3*  --   HGB 10.2* 11.6*  HCT 32.1* 34.0*  MCV 90.9  --   PLT 188  --    Coagulation:   Recent Labs Lab 10/07/13 1231  LABPROT 13.1  INR 1.01   Cardiac Enzymes: No results found for this basename: CKTOTAL, CKMB, CKMBINDEX, TROPONINI,  in the last 168 hours Urinalysis:   Recent Labs Lab 10/07/13 1519  COLORURINE YELLOW  LABSPEC 1.022  PHURINE 5.0  GLUCOSEU 250*  HGBUR SMALL*  BILIRUBINUR NEGATIVE  KETONESUR NEGATIVE  PROTEINUR 100*  UROBILINOGEN 1.0  NITRITE NEGATIVE  LEUKOCYTESUR LARGE*   Lipid Panel    Component Value Date/Time   CHOL 93 10/08/2013 0105   TRIG 160* 10/08/2013 0105   HDL 33* 10/08/2013 0105   CHOLHDL 2.8 10/08/2013 0105   VLDL 32 10/08/2013 0105   LDLCALC 28 10/08/2013 0105   HgbA1C  Lab Results  Component Value Date   HGBA1C 9.8* 10/07/2013    Urine Drug Screen:     Component Value Date/Time   LABOPIA POSITIVE* 10/07/2013 1519   COCAINSCRNUR NONE DETECTED 10/07/2013 1519   LABBENZ NONE DETECTED 10/07/2013 1519   AMPHETMU NONE DETECTED 10/07/2013 1519   THCU NONE DETECTED 10/07/2013 1519   LABBARB NONE DETECTED 10/07/2013 1519    Alcohol  Level:   Recent Labs Lab 10/07/13 1231  ETH <11    CT of the brain  10/07/2013    No acute intracranial abnormality.     MRI of the brain  10/07/2013    Negative for acute infarct. Moderate to advanced chronic microvascular ischemic change.    MRA of the brain  10/07/2013    MRA is degraded by motion. There appears to be diffuse intracranial atherosclerotic disease, most severe involving the MCA bifurcation, right greater than left.  2D Echocardiogram  EF 55-60% with no source of embolus.   Carotid Doppler  40-59% internal carotid artery stenosis. Vertebral artery flow is antegrade.  EKG  Sinus or ectopic atrial rhythm. For complete results please see formal report.   Therapy Recommendations SNF  Physical Exam   Elderly lady not in distress.Awake alert. Afebrile. Head is nontraumatic. Neck is supple without bruit. Hearing is normal. Cardiac exam no murmur or gallop. Lungs are clear to auscultation. Distal pulses are well felt. Neurological Exam : Awake alert oriented x 3 normal speech and language. Mild left lower face asymmetry. Tongue midline. No drift. Mild diminished fine finger movements on left. Orbits right over left upper extremity. Mild left grip weak.. Normal sensation . Normal coordination. ASSESSMENT [name] is a 79 y.o. female presenting with LEFT HEMIPARESIS, LEFT FACE WEAKNESS, DYSARTHRIA. Imaging negative for acute infarct. Dx:  Right brain TIA On aspirin 81 mg orally every day prior to admission. Now on aspirin 325 mg orally every day for secondary stroke prevention.  Stroke work up completed.  hypertension Diabetes, HgbA1c 9.8, goal < 7.0 COPID Hyperlipidemia, LDL 28, on crestor 20 PTA, now on lipitor 40, goal LDL < 70 for diabetics Macular degeneration, bilateral PAD CKD, stage III, baseline Cr 1.8-2.0 Hx HA CAD  Cigarette smoker  Left heel ulcer  Poor functional status - minimally ambulatary d/t bilateral extremity weawkness PTA  UTI, positive  UA, on rocephin, Cx pending  Left knee pain, concern for nerve impingement, can do testing as an OP  obesity. Body mass index is 33.48 kg/(m^2).   Hospital day # 1  TREATMENT/PLAN  Continue aspirin 325 mg orally every day for stroke prevention.  F/u left knee pain for possible nerve impingement as an OP with Dr. Erlinda Hong  Planned Dispo: SNF  No further stroke workup indicated. Patient has a 10-15% risk of having another stroke over the next year, the highest risk is within 2 weeks of the most recent stroke/TIA (risk of having a stroke following a  stroke or TIA is the same). Ongoing risk factor control by Primary Care Physician Stroke Service will sign off. Please call should any needs arise. Follow up with Dr. Erlinda [facility], in 2 months.   SIGNED Burnetta Sabin, MSN, RN, ANVP-BC, ANP-BC, GNP-BC [facility] Pager: [phone] 10/08/2013 7:42 PM  I have personally obtained a history, examined the patient, evaluated imaging results, and formulated the assessment and plan of care. I agree with the above. Antony Contras, MD   To contact Stroke Continuity provider, please refer to [email or web address] After hours, contact General Neurology

## 2013-10-08 NOTE — Progress Notes (Signed)
Bilateral carotid artery duplex:  40-59% internal carotid artery stenosis.  Vertebral artery flow is antegrade.

## 2013-10-08 NOTE — Consult Note (Signed)
WOC wound consult note Reason for Consult: Consult requested for left heel wound.  Pt states this is chronic for over 8 months and she is followed by the outpatient wound care center prior to admission.  She is unsure of what topical treatment they are using and dressing is changed every other day. Wound type: Left heel full thickness wound Measurement:1.2X1X.2cm Wound bed: 30% yellow, 70% red Drainage (amount, consistency, odor) Mod amt yellow drainage, no odor Periwound: Intact skin surrounding. Dressing procedure/placement/frequency: Alginate to absorb drainage and foam dressing to protect.  Pt has heel-shaped foam dressing to reduce pressure to affected area. Will continue dressing changes every other day as previously ordered. Float heel to offload.  Pt can resume follow-up with outpatient wound care center after discharge. Please re-consult if further assistance is needed.  Thank-you,  Julien Girt MSN, [place], [place], [place], [place]

## 2013-10-08 NOTE — Progress Notes (Addendum)
[facility] PROGRESS NOTE  [name] PIR:[id number] DOB: 1935/04/23 DOA: 10/07/2013 PCP: Mayra Neer, MD  Interim Summary Patient is a pleasant 79 year old female with a past medical history of hypertension, diabetes mellitus, coronary artery disease chronic, obstructive pulmonary disease, resident at [facility], admitted to the medicine service on 10/07/2013 presenting as a code stroke. Patient noted by nursing home staff to have slurred speech and left upper extremity weakness. She was seen and evaluated by Dr. Armida Sans of neurology, not felt to be candidate for thrombolytics. Had an IHSS score of 6 on arrival. MRI of brain negative for acute stroke. Moderate to advanced chronic microvascular ischemic change noted. On this mornings evaluation patient reports resolution to slurred speech however has some left upper extremity weakness.                                                                                                                                                                                                                                        Assessment/Plan: 1. TIA -Patient developing slurred speech and left upper extremity weakness at her facility yesterday -Code CVA called, patient not candidate for thrombolytics. Seen by neurology. -CT scan of brain without contrast negative, MRI of the brain at negative for acute CVA -Pending transthoracic echocardiogram, Carotid Dopplers, physical therapy, occupational therapy consultation -continue antiplatelet therapy with aspirin 325 mg by mouth daily and statin  2.  Type 2 diabetes mellitus -Patient presenting initially with elevated blood sugars in the 300s -AM Blood sugar improving to 93 -Status he'll coverage with Accu-Cheks q. A.c. Each bedtime, insulin glargine 35 units subcutaneous daily  3. Left heel ulcer -Will care consultation is in place  4. Poor functional status -Patient reports being minimally  ambulatory at her facility due to bilateral extremity weakness -Physical therapy consultation pending  5. Stage III chronic kidney disease -Baseline creatinine near 1.8 - 2.0 -stable  6.  Urinary tract infection -Patient having a positive UA on admission -Pending urine cultures, meanwhile continue empiric coverage with ceftriaxone 1 g IV every 24 hours   Code Status: Full Code Family Communication:  Disposition Plan: CVA/TIA workup   Consultants:  Neurology  Procedures:  Pending echo and dopplers  Antibiotics:  Doxy  HPI/Subjective: Reports resolution to slurred speech, left upper extremity "still feels a little weak." Tolerating PO  Objective: Filed Vitals:   10/08/13 0552  BP: 135/37  Pulse: 57  Temp: 98.6 F (37 C)  Resp: 16    Intake/Output Summary (Last 24 hours) at 10/08/13 0757 Last data filed at 10/07/13 1524  Gross per 24 hour  Intake      0 ml  Output    500 ml  Net   -500 ml   Filed Weights   10/08/13 0500  Weight: 85.7 kg (188 lb 15 oz)    Exam:   General:  Sitting up having her breakfast, no acute distress  Cardiovascular: Regular rate and rhythm, normal S1S2   Respiratory: normal inspiratory effort, clear to auscultation bilaterally  Abdomen: Soft nontender nondistended  Musculoskeletal: 1+ edema to lower extremities bilaterally  Neuro: No facial droop or slurred speech, has 4/5 muscle strength to left upper extremity, 5/5 to right upper extremity   Data Reviewed: Basic Metabolic Panel:  Recent Labs Lab 10/07/13 1231 10/07/13 1303  NA 136* 139  K 4.9 4.8  CL 100 102  CO2 22  --   GLUCOSE 380* 379*  BUN 44* 42*  CREATININE 1.81* 1.90*  CALCIUM 9.2  --    Liver Function Tests:  Recent Labs Lab 10/07/13 1231  AST 10  ALT 8  ALKPHOS 61  BILITOT 0.3  PROT 6.4  ALBUMIN 2.8*   No results found for this basename: LIPASE, AMYLASE,  in the last 168 hours No results found for this basename: AMMONIA,  in the last 168  hours CBC:  Recent Labs Lab 10/07/13 1231 10/07/13 1303  WBC 14.2*  --   NEUTROABS 9.3*  --   HGB 10.2* 11.6*  HCT 32.1* 34.0*  MCV 90.9  --   PLT 188  --    Cardiac Enzymes: No results found for this basename: CKTOTAL, CKMB, CKMBINDEX, TROPONINI,  in the last 168 hours BNP (last 3 results) No results found for this basename: PROBNP,  in the last 8760 hours CBG:  Recent Labs Lab 10/07/13 1413 10/07/13 1725 10/07/13 2215 10/08/13 0649  GLUCAP 315* 274* 303* 93    No results found for this or any previous visit (from the past 240 hour(s)).   Studies: Ct Head Wo Contrast  10/07/2013   CLINICAL DATA:  Code stroke.  Left-sided weakness.  Slurred speech.  EXAM: CT HEAD WITHOUT CONTRAST  TECHNIQUE: Contiguous axial images were obtained from the base of the skull through the vertex without intravenous contrast.  COMPARISON:  CT scan dated 10/31/2012  FINDINGS: There is no acute intracranial hemorrhage, infarction, or mass lesion. There are subtle old periventricular white matter lucencies consistent with small vessel ischemic disease. There is diffuse slight cerebral cortical atrophy with dilatation of the third and lateral ventricles which is stable. No osseous abnormality.  IMPRESSION: No acute intracranial abnormality.   Electronically Signed   By: Rozetta Nunnery M.D.   On: 10/07/2013 12:46   Mr Jodene Nam Head Wo Contrast  10/07/2013   CLINICAL DATA:  Stroke  EXAM: MRI HEAD WITHOUT CONTRAST  MRA HEAD WITHOUT CONTRAST  TECHNIQUE: Multiplanar, multiecho pulse sequences of the brain and surrounding structures were obtained without intravenous contrast. Angiographic images of the head were obtained using MRA technique without contrast.  COMPARISON:  CT head 10/07/2013  FINDINGS: MRI HEAD FINDINGS  Image quality degraded by mild motion.  Negative for acute infarct.  Generalized atrophy. Moderate to advanced chronic microvascular ischemic change throughout the cerebral white matter and pons.   Negative for hemorrhage or mass. No edema or shift of the midline structures.  Paranasal sinuses are clear.  MRA  HEAD FINDINGS  Image quality degraded by motion.  Both vertebral arteries are patent to the basilar. Mild irregularity of the vertebral arteries. The basilar is widely patent. Fetal origin of the left posterior cerebral artery with hypoplastic left P1 segment. Posterior cerebral arteries are patent bilaterally.  Atherosclerotic irregularity in the cavernous carotid bilaterally with mild narrowing. Anterior and middle cerebral arteries are patent bilaterally. There is diffuse irregularity of the anterior and middle cerebral arteries. This is most likely due to atherosclerotic disease however these vessels are not evaluated in detail due to motion. There appears to be a moderate stenosis of left MCA bifurcation is severe stenosis of the right MCA bifurcation. .  IMPRESSION: Negative for acute infarct. Moderate to advanced chronic microvascular ischemic change.  MRA is degraded by motion. There appears to be diffuse intracranial atherosclerotic disease, most severe involving the MCA bifurcation, right greater than left.   Electronically Signed   By: Franchot Gallo M.D.   On: 10/07/2013 17:07   Mr Brain Wo Contrast  10/07/2013   CLINICAL DATA:  Stroke  EXAM: MRI HEAD WITHOUT CONTRAST  MRA HEAD WITHOUT CONTRAST  TECHNIQUE: Multiplanar, multiecho pulse sequences of the brain and surrounding structures were obtained without intravenous contrast. Angiographic images of the head were obtained using MRA technique without contrast.  COMPARISON:  CT head 10/07/2013  FINDINGS: MRI HEAD FINDINGS  Image quality degraded by mild motion.  Negative for acute infarct.  Generalized atrophy. Moderate to advanced chronic microvascular ischemic change throughout the cerebral white matter and pons.  Negative for hemorrhage or mass. No edema or shift of the midline structures.  Paranasal sinuses are clear.  MRA HEAD FINDINGS   Image quality degraded by motion.  Both vertebral arteries are patent to the basilar. Mild irregularity of the vertebral arteries. The basilar is widely patent. Fetal origin of the left posterior cerebral artery with hypoplastic left P1 segment. Posterior cerebral arteries are patent bilaterally.  Atherosclerotic irregularity in the cavernous carotid bilaterally with mild narrowing. Anterior and middle cerebral arteries are patent bilaterally. There is diffuse irregularity of the anterior and middle cerebral arteries. This is most likely due to atherosclerotic disease however these vessels are not evaluated in detail due to motion. There appears to be a moderate stenosis of left MCA bifurcation is severe stenosis of the right MCA bifurcation. .  IMPRESSION: Negative for acute infarct. Moderate to advanced chronic microvascular ischemic change.  MRA is degraded by motion. There appears to be diffuse intracranial atherosclerotic disease, most severe involving the MCA bifurcation, right greater than left.   Electronically Signed   By: Franchot Gallo M.D.   On: 10/07/2013 17:07    Scheduled Meds: . amitriptyline  50 mg Oral QHS  . aspirin  300 mg Rectal Daily  . aspirin  325 mg Oral Daily  . atorvastatin  40 mg Oral q1800  . budesonide-formoterol  2 puff Inhalation BID  . cefTRIAXone (ROCEPHIN)  IV  1 g Intravenous Q24H  . doxycycline  100 mg Oral Q12H  . enoxaparin (LOVENOX) injection  30 mg Subcutaneous Q24H  . gabapentin  100 mg Oral BID WC  . gabapentin  300 mg Oral Q2000  . insulin aspart  0-15 Units Subcutaneous TID WC  . insulin aspart  0-5 Units Subcutaneous QHS  . insulin glargine  35 Units Subcutaneous QHS  . levothyroxine  50 mcg Oral q morning - 10a  . senna-docusate  2 tablet Oral QHS   Continuous Infusions: . sodium chloride 75 mL/hr  at 10/08/13 7322    Principal Problem:   TIA (transient ischemic attack) Active Problems:   DM   COPD   Weakness generalized   CKD (chronic  kidney disease) stage 3, GFR 30-59 ml/min   Ulcer of left heel    Time spent: 30 min    [facility] Pager [phone]. If 7PM-7AM, please contact night-coverage at [email or web address], password [facility] 10/08/2013, 7:57 AM  LOS: 1 day

## 2013-10-08 NOTE — Progress Notes (Signed)
  Echocardiogram 2D Echocardiogram has been performed.  Donata Clay 10/08/2013, 1:57 PM

## 2013-10-08 NOTE — Evaluation (Signed)
Physical Therapy Evaluation Patient Details Name: [name] MRN: [id number] DOB: 1934-07-08 Today's Date: 10/08/2013   History of Present Illness  Patient is a 79 year old female with history of hypertension, diabetes, insulin-dependent, cardiac disease, CAD, CKD stage III, COPD, hypothyroidism, currently resident of [facility] was brought in 5/25 via EMS as code stroke due to slurred speech, facial droop and left-sided weakness noticed around 10 AM by the facility staff.   Clinical Impression  Pt non-ambulatory at baseline however reports she was able to transfer herself in/out of bed to w/c and propel herself with her feet PTA. Pt now requires maxAx2 for oob transfer to the chair. Pt with decreased activity tolerance, generalized weakness L>R. Pt to benefit from rehab at [facility] where she resides.    Follow Up Recommendations SNF;Supervision/Assistance - 24 hour    Equipment Recommendations       Recommendations for Other Services       Precautions / Restrictions Precautions Precautions: Fall Restrictions Weight Bearing Restrictions: No      Mobility  Bed Mobility Overal bed mobility: Needs Assistance Bed Mobility: Rolling;Sidelying to Sit Rolling: Min assist Sidelying to sit: Max assist       General bed mobility comments: max A to bring hips to the EOB, assist for trunk elevation  Transfers Overall transfer level: Needs assistance Equipment used: Rolling walker (2 wheeled) (2 person face to face lift) Transfers: Sit to/from [facility] Sit to Stand: Max assist;+2 physical assistance Stand pivot transfers: Max assist;+2 physical assistance       General transfer comment: attempted to use walker however pt unable to use succesfully. completed a face to face lift with 2 people and gait belt. Pt able to maintain standing x 1 min to pull depends up. Pt then able to take 3 steps to chair. Pt very reliant on UEs and support of 2  person assist  Ambulation/Gait Ambulation/Gait assistance:  (unable to at this time)              Financial trader Rankin (Stroke Patients Only) Modified Rankin (Stroke Patients Only) Pre-Morbid Rankin Score: Severe disability Modified Rankin: Severe disability     Balance Overall balance assessment: Needs assistance Sitting-balance support: Feet supported;Bilateral upper extremity supported Sitting balance-Leahy Scale: Poor   Postural control: Posterior lean Standing balance support: Bilateral upper extremity supported Standing balance-Leahy Scale: Poor Standing balance comment: requires 2 person assist                             Pertinent Vitals/Pain C/o L foot pain at site of the ulcer    Home Living Family/patient expects to be discharged to:: Skilled nursing facility                      Prior Function Level of Independence: Needs assistance   Gait / Transfers Assistance Needed: pt reports she hasn't walked in over a year  ADL's / Homemaking Assistance Needed: assist with bathing dressing. Pt can bath self once set up        Hand Dominance   Dominant Hand: Right    Extremity/Trunk Assessment   Upper Extremity Assessment: Generalized weakness (L > R)           Lower Extremity Assessment: Generalized weakness (L >R)      Cervical / Trunk Assessment: Normal  Communication   Communication: No difficulties  Cognition Arousal/Alertness: Awake/alert Behavior During Therapy: WFL for tasks assessed/performed Overall Cognitive Status: Impaired/Different from baseline Area of Impairment: Problem solving;Orientation Orientation Level: Disoriented to;Place (inconsistent reports)   Memory: Decreased short-term memory       Problem Solving: Slow processing;Decreased initiation;Difficulty sequencing;Requires verbal cues;Requires tactile cues General Comments: pt with inconsistent report of  PLOF, pt states she's in her room at [facility] but then when asked where she is she states hospital. Pt reports "I used to get up fine" but then has said "I haven't walked in a year."    General Comments      Exercises        Assessment/Plan    PT Assessment Patient needs continued PT services  PT Diagnosis Generalized weakness;Difficulty walking   PT Problem List Decreased strength;Decreased activity tolerance;Decreased balance;Decreased mobility  PT Treatment Interventions DME instruction;Gait training;Functional mobility training;Therapeutic activities;Therapeutic exercise;Balance training   PT Goals (Current goals can be found in the Care Plan section) Acute Rehab PT Goals PT Goal Formulation: With patient Time For Goal Achievement: 10/22/13 Potential to Achieve Goals: Good    Frequency Min 3X/week   Barriers to discharge        Co-evaluation               End of Session Equipment Utilized During Treatment: Gait belt Activity Tolerance: Patient limited by fatigue Patient left: in chair;with call bell/phone within reach;with chair alarm set;with nursing/sitter in room Nurse Communication: Mobility status (she was present to help)         Time: 2130-8657 PT Time Calculation (min): 24 min   Charges:   PT Evaluation $Initial PT Evaluation Tier I: 1 Procedure PT Treatments $Therapeutic Activity: 8-22 mins   PT G CodesKoleen Distance Dorthula Bier 10/08/2013, 12:23 PM  Kittie Plater, PT, DPT Pager #: [phone] Office #: [phone]

## 2013-10-08 NOTE — Clinical Social Work Psychosocial (Signed)
Clinical Social Work Department BRIEF PSYCHOSOCIAL ASSESSMENT 10/08/2013  Patient:  [name], [name]     Account Number:  [id number]     Admit date:  10/07/2013  Clinical Social Worker:  Daiva Huge  Date/Time:  10/08/2013 12:32 PM  Referred by:  Physician  Date Referred:  10/08/2013 Referred for  ALF Placement   Other Referral:   Interview type:  Other - See comment Other interview type:   Spoke with patient as well as staff at [facility] where patient resides    PSYCHOSOCIAL DATA Living Status:  FACILITY Admitted from facility:  Other Level of care:  Assisted Living Primary support name:  sons and daughter Primary support relationship to patient:  FAMILY Degree of support available:   good    CURRENT CONCERNS Current Concerns  Post-Acute Placement   Other Concerns:    SOCIAL WORK ASSESSMENT / PLAN Patient reports that she has lived at [facility] for 2 years- she is pleased there and plans to retrun there at d/c- I have also spoken with the ALF staff who plan for her return- we are awaiting PT and OT consults and have had a WOC consult- ALF states she was out with her fmaily when she came to hospital- they were caring for her wounds at ALF already per staff and anticipate her return.   Assessment/plan status:  Other - See comment Other assessment/ plan:   update FL2 for ALF return   Information/referral to community resources:    PATIENT'S/FAMILY'S RESPONSE TO PLAN OF CARE: Patient hopeful for return to ALF soon- at this time, we are awaiting PT and OT evals and recommndations.   Eduard Clos, MSW, [place]

## 2013-10-08 NOTE — Evaluation (Signed)
Occupational Therapy Evaluation Patient Details Name: [name] MRN: [id number] DOB: March 12, 1935 Today's Date: 10/08/2013    History of Present Illness Patient is a 79 year old female with history of hypertension, diabetes, insulin-dependent, cardiac disease, CAD, CKD stage III, COPD, hypothyroidism, currently resident of [facility] was brought in 5/25 via EMS as code stroke due to slurred speech, facial droop and left-sided weakness noticed around 10 AM by the facility staff.    Clinical Impression   Pt admitted with the above diagnoses and presents with below problem list. Pt presents with apparent functional decline different from baseline. Pt will benefit from continued acute OT to address the below listed deficits and improve level of independence with basic ADLs prior to d/c back to SNF.     Follow Up Recommendations  SNF    Equipment Recommendations  None recommended by OT    Recommendations for Other Services       Precautions / Restrictions Precautions Precautions: Fall Restrictions Weight Bearing Restrictions: No      Mobility Bed Mobility Overal bed mobility: Needs Assistance Bed Mobility: Sit to Supine     Sit to supine: Max assist   General bed mobility comments: able to bridge and scoot short distance up in bed 2x in supine before fatiguing and needing +2 physical assist to move up in bed with bed in trendelenburg  Transfers Overall transfer level: Needs assistance Equipment used: 2 person hand held assist Transfers: Sit to/from Stand;Stand Pivot Transfers Sit to Stand: Max assist;+2 physical assistance Stand pivot transfers: Max assist;+2 physical assistance       General transfer comment: initial sit>stand with +2 mod physical A, pt fatigued to +2 max physical A for sit>stand. +2 max physical A to stand pivot to EOB.  Pt stands <1 minute before fatiguing.    Balance Overall balance assessment: Needs assistance Sitting-balance  support: Bilateral upper extremity supported;Feet supported Sitting balance-Leahy Scale: Poor   Postural control: Posterior lean Standing balance support: Bilateral upper extremity supported;During functional activity Standing balance-Leahy Scale: Poor Standing balance comment: maintains standing <1 min, fatigues easily                            ADL Overall ADL's : Needs assistance/impaired Eating/Feeding: Set up;Sitting   Grooming: Set up;Sitting   Upper Body Bathing: Sitting;Minimal assitance   Lower Body Bathing: +2 for physical assistance;Sit to/from stand   Upper Body Dressing : Minimal assistance;Sitting   Lower Body Dressing: +2 for physical assistance;Sit to/from stand   Toilet Transfer: Stand-pivot;+2 for physical assistance;BSC   Toileting- Clothing Manipulation and Hygiene: +2 for physical assistance;Sit to/from stand   Tub/ Shower Transfer: +2 for physical assistance;Stand-pivot;3 in 1   Functional mobility during ADLs: +2 for physical assistance;Maximal assistance General ADL Comments: Pt presents with decreased independence with ADLs compared to baseline. Pt fatigues easily in sit>stand. WFL BUE AROM and strength     Vision                     Perception     Praxis      Pertinent Vitals/Pain Reports constant pain in R foot at site of ulcer.     Hand Dominance Right   Extremity/Trunk Assessment Upper Extremity Assessment Upper Extremity Assessment: Overall WFL for tasks assessed   Lower Extremity Assessment Lower Extremity Assessment: Defer to PT evaluation; pt with right foot decubitus   Cervical / Trunk Assessment Cervical / Trunk Assessment: Normal  Communication Communication Communication: No difficulties   Cognition Arousal/Alertness: Awake/alert Behavior During Therapy: WFL for tasks assessed/performed Overall Cognitive Status: No family/caregiver present to determine baseline cognitive functioning however presents  with apparent cognitive decline especially in regards to insight and awareness of deficits. Pt provided inconsistent reports of PLOF.              General Comments       Exercises       Shoulder Instructions      Home Living Family/patient expects to be discharged to:: Skilled nursing facility                                 Additional Comments: [facility]      Prior Functioning/Environment Level of Independence: Needs assistance  Gait / Transfers Assistance Needed: pt reports using walker to ambulate PTA ADL's / Homemaking Assistance Needed: per pt report min A for UB dressing(undergarment), supervision for bathing; indpendent with most ADLs        OT Diagnosis: Acute pain   OT Problem List: Decreased strength;Decreased activity tolerance;Impaired balance (sitting and/or standing);Decreased safety awareness;Decreased knowledge of use of DME or AE;Decreased knowledge of precautions;Pain   OT Treatment/Interventions: Self-care/ADL training;Therapeutic exercise;Neuromuscular education;DME and/or AE instruction;Therapeutic activities;Patient/family education;Balance training    OT Goals(Current goals can be found in the care plan section) Acute Rehab OT Goals OT Goal Formulation: With patient Time For Goal Achievement: 10/15/13 Potential to Achieve Goals: Good ADL Goals Pt Will Perform Lower Body Dressing: with mod assist;sit to/from stand Pt Will Transfer to Toilet: with mod assist;stand pivot transfer;bedside commode Pt Will Perform Tub/Shower Transfer: with mod assist;Stand pivot transfer;3 in 1 Additional ADL Goal #1: Pt will maintain standing position for 1 minute with mod A to assist with LB ADLs and transfers.   OT Frequency: Min 2X/week   Barriers to D/C:            Co-evaluation              End of Session Equipment Utilized During Treatment: Gait belt Nurse Communication: Need for lift equipment;Other (comment) (recommend  using steady for transfers)  Activity Tolerance: Patient limited by fatigue Patient left: in bed;with call bell/phone within reach;with bed alarm set   Time: 6384-6659 OT Time Calculation (min): 17 min Charges:  OT General Charges $OT Visit: 1 Procedure OT Evaluation $Initial OT Evaluation Tier I: 1 Procedure OT Treatment $Self-Care/Home Management: 8-22 mins G-Codes:    Hortencia Pilar 2013/11/03, 1:04 PM

## 2013-10-08 NOTE — Progress Notes (Signed)
UR complete.  Mekaylah Klich RN, MSN 

## 2013-10-08 NOTE — Progress Notes (Signed)
Nutrition Brief Note  Patient identified on the Malnutrition Screening Tool (MST) Report  Wt Readings from Last 15 Encounters:  10/08/13 188 lb 15 oz (85.7 kg)  06/23/13 202 lb 2.6 oz (91.7 kg)  11/02/12 190 lb 1.6 oz (86.229 kg)  01/24/12 185 lb 3.2 oz (84.006 kg)    Body mass index is 33.48 kg/(m^2). Patient meets criteria for Obesity based on current BMI.   Current diet order is Carb Modified, patient is consuming approximately 75% of meals at this time.Pt reports having a good appetite and eating well. She denies any weight loss stating she usually weighs 188 lbs. Pt denies any nutrition-related questions. Labs and medications reviewed.   No nutrition interventions warranted at this time. If nutrition issues arise, please consult RD.   Pryor Ochoa RD, LDN Inpatient Clinical Dietitian Pager: [phone] After Hours Pager: [phone]

## 2013-10-08 NOTE — Progress Notes (Signed)
Pt arrived via stretcher to 4N11.  Pt alert and oriented. NIH of 2 on assessment, vital sign stable, no complaints from pt. Bed alarm on, call bell in place, Patient and family provided with education materials and answered questions. Will continue to monitor. Aisha RN

## 2013-10-09 ENCOUNTER — Inpatient Hospital Stay (HOSPITAL_COMMUNITY): Payer: Medicare Other

## 2013-10-09 LAB — CBC
HCT: 29 % — ABNORMAL LOW (ref 36.0–46.0)
Hemoglobin: 9.2 g/dL — ABNORMAL LOW (ref 12.0–15.0)
MCH: 29.2 pg (ref 26.0–34.0)
MCHC: 31.7 g/dL (ref 30.0–36.0)
MCV: 92.1 fL (ref 78.0–100.0)
PLATELETS: 183 10*3/uL (ref 150–400)
RBC: 3.15 MIL/uL — ABNORMAL LOW (ref 3.87–5.11)
RDW: 13.4 % (ref 11.5–15.5)
WBC: 7.6 10*3/uL (ref 4.0–10.5)

## 2013-10-09 LAB — BASIC METABOLIC PANEL
BUN: 34 mg/dL — ABNORMAL HIGH (ref 6–23)
CALCIUM: 8.9 mg/dL (ref 8.4–10.5)
CO2: 23 meq/L (ref 19–32)
Chloride: 109 mEq/L (ref 96–112)
Creatinine, Ser: 1.38 mg/dL — ABNORMAL HIGH (ref 0.50–1.10)
GFR calc Af Amer: 42 mL/min — ABNORMAL LOW (ref >90)
GFR, EST NON AFRICAN AMERICAN: 36 mL/min — AB (ref >90)
Glucose, Bld: 129 mg/dL — ABNORMAL HIGH (ref 70–99)
POTASSIUM: 4.3 meq/L (ref 3.7–5.3)
SODIUM: 143 meq/L (ref 137–147)

## 2013-10-09 LAB — GLUCOSE, CAPILLARY
GLUCOSE-CAPILLARY: 152 mg/dL — AB (ref 70–99)
Glucose-Capillary: 163 mg/dL — ABNORMAL HIGH (ref 70–99)
Glucose-Capillary: 171 mg/dL — ABNORMAL HIGH (ref 70–99)

## 2013-10-09 MED ORDER — CLONIDINE HCL 0.1 MG PO TABS
0.1000 mg | ORAL_TABLET | Freq: Three times a day (TID) | ORAL | Status: DC
  Administered 2013-10-09 – 2013-10-10 (×3): 0.1 mg via ORAL
  Filled 2013-10-09 (×7): qty 1

## 2013-10-09 MED ORDER — ENOXAPARIN SODIUM 40 MG/0.4ML ~~LOC~~ SOLN
40.0000 mg | SUBCUTANEOUS | Status: DC
  Administered 2013-10-09: 40 mg via SUBCUTANEOUS
  Filled 2013-10-09 (×2): qty 0.4

## 2013-10-09 MED ORDER — CARVEDILOL 12.5 MG PO TABS
12.5000 mg | ORAL_TABLET | Freq: Two times a day (BID) | ORAL | Status: DC
  Administered 2013-10-09 – 2013-10-10 (×2): 12.5 mg via ORAL
  Filled 2013-10-09 (×4): qty 1

## 2013-10-09 NOTE — Clinical Social Work Note (Signed)
PT and OT recommending SNF at d/c- I have spoken with patient about this and she is open to SNF if needed- stating she has been to one of the [facility]-  Son feels like SNF would be beneficial and prefers [facility] where she went before and states, "it helped her tremendously". CSW will proceed with SNF search and advise-   Eduard Clos, MSW, [place]

## 2013-10-09 NOTE — Clinical Social Work Placement (Signed)
Clinical Social Work Department CLINICAL SOCIAL WORK PLACEMENT NOTE 10/09/2013  Patient:  [name], [name]  Account Number:  [id number] Admit date:  10/07/2013  Clinical Social Worker:  Daiva Huge  Date/time:  10/09/2013 11:55 AM  Clinical Social Work is seeking post-discharge placement for this patient at the following level of care:   SKILLED NURSING   (*CSW will update this form in Epic as items are completed)   10/09/2013  Patient/family provided with [facility] Department of Clinical Social Work's list of facilities offering this level of care within the geographic area requested by the patient (or if unable, by the patient's family).  10/09/2013  Patient/family informed of their freedom to choose among providers that offer the needed level of care, that participate in Medicare, Medicaid or managed care program needed by the patient, have an available bed and are willing to accept the patient.  10/09/2013  Patient/family informed of [facility]' ownership interest in [facility], as well as of the fact that they are under no obligation to receive care at this facility.  PASARR submitted to EDS on 10/09/2013 PASARR number received from EDS on 10/09/2013  FL2 transmitted to all facilities in geographic area requested by pt/family on  10/09/2013 FL2 transmitted to all facilities within larger geographic area on   Patient informed that his/her managed care company has contracts with or will negotiate with  certain facilities, including the following:     Patient/family informed of bed offers received:   Patient chooses bed at  Physician recommends and patient chooses bed at    Patient to be transferred to  on   Patient to be transferred to facility by   The following physician request were entered in Epic:   Additional Comments: Eduard Clos, MSW, [place]

## 2013-10-09 NOTE — Progress Notes (Signed)
Inpatient Diabetes Program Recommendations  AACE/ADA: New Consensus Statement on Inpatient Glycemic Control (2013)  Target Ranges:  Prepandial:   less than 140 mg/dL      Peak postprandial:   less than 180 mg/dL (1-2 hours)      Critically ill patients:  140 - 180 mg/dL   Results for ESMIRNA, RAVAN (MRN [id number]) as of 10/09/2013 08:55  Ref. Range 10/07/2013 14:13 10/07/2013 17:25 10/07/2013 22:15 10/08/2013 06:49 10/08/2013 11:34 10/08/2013 16:19 10/08/2013 22:22  Glucose-Capillary Latest Range: 70-99 mg/dL 315 (H) 274 (H) 303 (H) 93 136 (H) 211 (H) 206 (H)   Diabetes history: DM2 Outpatient Diabetes medications: Lantus 35 units QHS, Humalog 0-14 units TID with meals Current orders for Inpatient glycemic control: Lantus 35 units QHS, Novolog 0-15 units AC, Novolog 0-5 HS  Inpatient Diabetes Program Recommendations Insulin - Meal Coverage: If postprandial glucose consistently greater than 200 mg/dl, may want to consider ordering Novolog 3 units TID with meals as meal coverage if patient eats at least 50% of meal.  Thanks, Barnie Alderman, RN, MSN, [place] Diabetes Coordinator Inpatient Diabetes Program [phone] (Team Pager) [phone] (AP office) [phone] [facility] office)

## 2013-10-09 NOTE — Progress Notes (Signed)
Patient's daughter Donna's number [phone] given to social work-- she has questions about going from assisted living to [facility]

## 2013-10-09 NOTE — Progress Notes (Signed)
[facility] PROGRESS NOTE  [name] MGQ:[id number] DOB: Jan 17, 1935 DOA: 10/07/2013 PCP: Mayra Neer, MD  Assessment/Plan: 79  Y/o female with PMH of HTN, DM type II, CAD, PAD, CKD, COPD, bilateral macular degeneration, and hypothyroidism, brought in by ambulance as a code stroke due to L sided weakness, dysarthria  -resident at [facility], admitted to the medicine service on 10/07/2013 presenting as a code stroke. Patient noted by nursing home staff to have slurred speech and left upper extremity weakness. She was seen and evaluated by Dr. Armida Sans of neurology, not felt to be candidate for thrombolytics. Had an IHSS score of 6 on arrival. MRI of brain negative for acute stroke. Moderate to advanced chronic microvascular ischemic change noted. On this mornings evaluation patient reports resolution to slurred speech however has some left upper extremity weakness.   1. TIA; MRI: Negative for acute infarct -symptoms resolving;  echo  No clear embolism  -continue antiplatelet therapy with aspirin 325 mg by mouth daily and statin  2. Type 2 diabetes mellitus uncontrolled  -increase insulin glargine 40 units subcutaneous daily  3. Left heel ulcer  -obtain x ray; cont wound care  4. Poor functional status  -Patient reports being minimally ambulatory at her facility due to bilateral extremity weakness  -Physical therapy: SNF 5. Stage III chronic kidney disease  -Baseline creatinine near 1.8 - 2.0  -stable  6. Urinary tract infection  -Patient having a positive UA on admission  -Pending urine cultures, meanwhile continue empiric coverage with ceftriaxone 1 g IV every 24 hours 7. HTN uncontrolled; resume clonidine to prevent HTN rebound; resume BB; monitor     Code Status: full  Family Communication: called Paelyn, [place] Son [phone] no answer;  -called updated [place],Carly Grandaughter [phone]  (indicate person spoken with, relationship, and if by phone, the  number) Disposition Plan: SNF 24-48 hours    Consultants:  Neurology   Procedures:  None   Antibiotics:  Ceftriaxone  (indicate start date, and stop date if known)  HPI/Subjective: alert  Objective: Filed Vitals:   10/09/13 1109  BP: 171/42  Pulse: 80  Temp: 99.7 F (37.6 C)  Resp: 20    Intake/Output Summary (Last 24 hours) at 10/09/13 1113 Last data filed at 10/08/13 2230  Gross per 24 hour  Intake   1185 ml  Output      0 ml  Net   1185 ml   Filed Weights   10/08/13 0500  Weight: 85.7 kg (188 lb 15 oz)    Exam:   General:  alert  Cardiovascular: s1,s2 rrr  Respiratory: CTA BL  Abdomen: soft, nt,nd no   Musculoskeletal: no LE edema   Data Reviewed: Basic Metabolic Panel:  Recent Labs Lab 10/07/13 1231 10/07/13 1303 10/09/13 0428  NA 136* 139 143  K 4.9 4.8 4.3  CL 100 102 109  CO2 22  --  23  GLUCOSE 380* 379* 129*  BUN 44* 42* 34*  CREATININE 1.81* 1.90* 1.38*  CALCIUM 9.2  --  8.9   Liver Function Tests:  Recent Labs Lab 10/07/13 1231  AST 10  ALT 8  ALKPHOS 61  BILITOT 0.3  PROT 6.4  ALBUMIN 2.8*   No results found for this basename: LIPASE, AMYLASE,  in the last 168 hours No results found for this basename: AMMONIA,  in the last 168 hours CBC:  Recent Labs Lab 10/07/13 1231 10/07/13 1303 10/09/13 0428  WBC 14.2*  --  7.6  NEUTROABS 9.3*  --   --  HGB 10.2* 11.6* 9.2*  HCT 32.1* 34.0* 29.0*  MCV 90.9  --  92.1  PLT 188  --  183   Cardiac Enzymes: No results found for this basename: CKTOTAL, CKMB, CKMBINDEX, TROPONINI,  in the last 168 hours BNP (last 3 results) No results found for this basename: PROBNP,  in the last 8760 hours CBG:  Recent Labs Lab 10/07/13 2215 10/08/13 0649 10/08/13 1134 10/08/13 1619 10/08/13 2222  GLUCAP 303* 93 136* 211* 206*    No results found for this or any previous visit (from the past 240 hour(s)).   Studies: Ct Head Wo Contrast  10/07/2013   CLINICAL DATA:  Code  stroke.  Left-sided weakness.  Slurred speech.  EXAM: CT HEAD WITHOUT CONTRAST  TECHNIQUE: Contiguous axial images were obtained from the base of the skull through the vertex without intravenous contrast.  COMPARISON:  CT scan dated 10/31/2012  FINDINGS: There is no acute intracranial hemorrhage, infarction, or mass lesion. There are subtle old periventricular white matter lucencies consistent with small vessel ischemic disease. There is diffuse slight cerebral cortical atrophy with dilatation of the third and lateral ventricles which is stable. No osseous abnormality.  IMPRESSION: No acute intracranial abnormality.   Electronically Signed   By: Rozetta Nunnery M.D.   On: 10/07/2013 12:46   Mr Jodene Nam Head Wo Contrast  10/07/2013   CLINICAL DATA:  Stroke  EXAM: MRI HEAD WITHOUT CONTRAST  MRA HEAD WITHOUT CONTRAST  TECHNIQUE: Multiplanar, multiecho pulse sequences of the brain and surrounding structures were obtained without intravenous contrast. Angiographic images of the head were obtained using MRA technique without contrast.  COMPARISON:  CT head 10/07/2013  FINDINGS: MRI HEAD FINDINGS  Image quality degraded by mild motion.  Negative for acute infarct.  Generalized atrophy. Moderate to advanced chronic microvascular ischemic change throughout the cerebral white matter and pons.  Negative for hemorrhage or mass. No edema or shift of the midline structures.  Paranasal sinuses are clear.  MRA HEAD FINDINGS  Image quality degraded by motion.  Both vertebral arteries are patent to the basilar. Mild irregularity of the vertebral arteries. The basilar is widely patent. Fetal origin of the left posterior cerebral artery with hypoplastic left P1 segment. Posterior cerebral arteries are patent bilaterally.  Atherosclerotic irregularity in the cavernous carotid bilaterally with mild narrowing. Anterior and middle cerebral arteries are patent bilaterally. There is diffuse irregularity of the anterior and middle cerebral  arteries. This is most likely due to atherosclerotic disease however these vessels are not evaluated in detail due to motion. There appears to be a moderate stenosis of left MCA bifurcation is severe stenosis of the right MCA bifurcation. .  IMPRESSION: Negative for acute infarct. Moderate to advanced chronic microvascular ischemic change.  MRA is degraded by motion. There appears to be diffuse intracranial atherosclerotic disease, most severe involving the MCA bifurcation, right greater than left.   Electronically Signed   By: Franchot Gallo M.D.   On: 10/07/2013 17:07   Mr Brain Wo Contrast  10/07/2013   CLINICAL DATA:  Stroke  EXAM: MRI HEAD WITHOUT CONTRAST  MRA HEAD WITHOUT CONTRAST  TECHNIQUE: Multiplanar, multiecho pulse sequences of the brain and surrounding structures were obtained without intravenous contrast. Angiographic images of the head were obtained using MRA technique without contrast.  COMPARISON:  CT head 10/07/2013  FINDINGS: MRI HEAD FINDINGS  Image quality degraded by mild motion.  Negative for acute infarct.  Generalized atrophy. Moderate to advanced chronic microvascular ischemic change throughout the cerebral white  matter and pons.  Negative for hemorrhage or mass. No edema or shift of the midline structures.  Paranasal sinuses are clear.  MRA HEAD FINDINGS  Image quality degraded by motion.  Both vertebral arteries are patent to the basilar. Mild irregularity of the vertebral arteries. The basilar is widely patent. Fetal origin of the left posterior cerebral artery with hypoplastic left P1 segment. Posterior cerebral arteries are patent bilaterally.  Atherosclerotic irregularity in the cavernous carotid bilaterally with mild narrowing. Anterior and middle cerebral arteries are patent bilaterally. There is diffuse irregularity of the anterior and middle cerebral arteries. This is most likely due to atherosclerotic disease however these vessels are not evaluated in detail due to motion.  There appears to be a moderate stenosis of left MCA bifurcation is severe stenosis of the right MCA bifurcation. .  IMPRESSION: Negative for acute infarct. Moderate to advanced chronic microvascular ischemic change.  MRA is degraded by motion. There appears to be diffuse intracranial atherosclerotic disease, most severe involving the MCA bifurcation, right greater than left.   Electronically Signed   By: Franchot Gallo M.D.   On: 10/07/2013 17:07    Scheduled Meds: . amitriptyline  50 mg Oral QHS  . aspirin  300 mg Rectal Daily  . aspirin  325 mg Oral Daily  . atorvastatin  40 mg Oral q1800  . budesonide-formoterol  2 puff Inhalation BID  . cefTRIAXone (ROCEPHIN)  IV  1 g Intravenous Q24H  . enoxaparin (LOVENOX) injection  30 mg Subcutaneous Q24H  . gabapentin  100 mg Oral BID WC  . gabapentin  300 mg Oral Q2000  . insulin aspart  0-15 Units Subcutaneous TID WC  . insulin aspart  0-5 Units Subcutaneous QHS  . insulin glargine  35 Units Subcutaneous QHS  . levothyroxine  50 mcg Oral q morning - 10a  . senna-docusate  2 tablet Oral QHS   Continuous Infusions: . sodium chloride 75 mL/hr at 10/09/13 0116    Principal Problem:   TIA (transient ischemic attack) Active Problems:   DM   COPD   Weakness generalized   CKD (chronic kidney disease) stage 3, GFR 30-59 ml/min   Ulcer of left heel    Time spent: >35 minutes    Kinnie Feil  [facility] Pager [phone]. If 7PM-7AM, please contact night-coverage at [email or web address], password [facility] 10/09/2013, 11:13 AM  LOS: 2 days

## 2013-10-10 LAB — CBC
HCT: 33.9 % — ABNORMAL LOW (ref 36.0–46.0)
Hemoglobin: 10.9 g/dL — ABNORMAL LOW (ref 12.0–15.0)
MCH: 29.1 pg (ref 26.0–34.0)
MCHC: 32.2 g/dL (ref 30.0–36.0)
MCV: 90.6 fL (ref 78.0–100.0)
PLATELETS: 233 10*3/uL (ref 150–400)
RBC: 3.74 MIL/uL — AB (ref 3.87–5.11)
RDW: 13.4 % (ref 11.5–15.5)
WBC: 11.4 10*3/uL — ABNORMAL HIGH (ref 4.0–10.5)

## 2013-10-10 LAB — GLUCOSE, CAPILLARY
GLUCOSE-CAPILLARY: 65 mg/dL — AB (ref 70–99)
GLUCOSE-CAPILLARY: 158 mg/dL — AB (ref 70–99)
Glucose-Capillary: 75 mg/dL (ref 70–99)
Glucose-Capillary: 79 mg/dL (ref 70–99)

## 2013-10-10 MED ORDER — LEVOFLOXACIN 500 MG PO TABS: 500.0000 mg | ORAL_TABLET | Freq: Every day | ORAL | Status: DC

## 2013-10-10 MED ORDER — ALPRAZOLAM 0.25 MG PO TABS: 0.2500 mg | ORAL_TABLET | Freq: Two times a day (BID) | ORAL | Status: DC | PRN

## 2013-10-10 MED ORDER — ASPIRIN 325 MG PO TABS: 325.0000 mg | ORAL_TABLET | Freq: Every day | ORAL | Status: DC

## 2013-10-10 MED ORDER — INSULIN GLARGINE 100 UNIT/ML ~~LOC~~ SOLN: 40.0000 [IU] | Freq: Every day | SUBCUTANEOUS | Status: DC

## 2013-10-10 MED ORDER — HYDROCODONE-ACETAMINOPHEN 5-325 MG PO TABS: 1.0000 | ORAL_TABLET | Freq: Three times a day (TID) | ORAL | Status: DC | PRN

## 2013-10-10 NOTE — Clinical Social Work Placement (Signed)
Clinical Social Work Department CLINICAL SOCIAL WORK PLACEMENT NOTE 10/10/2013  Patient:  [name], [name]  Account Number:  [id number] Admit date:  10/07/2013  Clinical Social Worker:  Daiva Huge  Date/time:  10/09/2013 11:55 AM  Clinical Social Work is seeking post-discharge placement for this patient at the following level of care:   SKILLED NURSING   (*CSW will update this form in Epic as items are completed)   10/09/2013  Patient/family provided with [place] Department of Clinical Social Work's list of facilities offering this level of care within the geographic area requested by the patient (or if unable, by the patient's family).  10/09/2013  Patient/family informed of their freedom to choose among providers that offer the needed level of care, that participate in Medicare, Medicaid or managed care program needed by the patient, have an available bed and are willing to accept the patient.  10/09/2013  Patient/family informed of [facility]' ownership interest in [facility], as well as of the fact that they are under no obligation to receive care at this facility.  PASARR submitted to EDS on 10/09/2013 PASARR number received from EDS on 10/09/2013  FL2 transmitted to all facilities in geographic area requested by pt/family on  10/09/2013 FL2 transmitted to all facilities within larger geographic area on   Patient informed that his/her managed care company has contracts with or will negotiate with  certain facilities, including the following:     Patient/family informed of bed offers received:  10/10/2013 Patient chooses bed at [place] Physician recommends and patient chooses bed at    Patient to be transferred to [place] on  10/10/2013 Patient to be transferred to facility by EMS  The following physician request were entered in Epic:   Additional Comments:   Eduard Clos, MSW,  [place]

## 2013-10-10 NOTE — Clinical Social Work Note (Signed)
SNF bed has been selected at [facility] and MD has indicated patient is ready for d/c today. Son to assist with paperwork at [facility] for admission at 1pm- will send patient via EMS thereafter- Patient and son are in agreement with above plans-  Eduard Clos, MSW, [place]

## 2013-10-10 NOTE — Progress Notes (Signed)
Adult Hypoglycemia Protocol Treatment Guidelines  1. RN shall initiate Hypoglycemia Protocol emergency measures immediately when:            w        Routine or STAT CBG and/or a lab glucose indicates hypoglycemia (CBG < 70 mg/dl)  2. Treat the patient according to ability to take PO's and severity of hypoglycemia.   3. If patient is on GlucoStabilizer, follow directions provided by the [facility] for hypoglycemic events.  4. If patient on insulin pump, follow Hypoglycemia Protocol.  If patient requires more than one treatment have patient place pump in SUSPEND and notify MD.  DO NOT leave pump in SUSPEND for greater than 30 minutes unless ordered by MD.  A. Treatment for Mild or Moderate-Patient cooperative and able to swallow    1.  Patient taking PO's and can cooperate   a.  Give one of the following 15 gram CHO options:                           w     1 tube oral dextrose gel                           w     3-4 Glucose tablets                           w     4 oz. Juice                           w     4 oz. regular soda                                    ESRD patients:  clear, regular soda                           w     8 oz. skim milk    b.  Recheck CBG in 15 minutes after treatment                            w       If CBG < 70 mg/dl, repeat treatment and recheck until hypoglycemia is resolved                            w       If CBG > 70 mg/dl and next meal is more than 1 hour away, give additional 15 grams CHO   2.  Patient NPO-Patient cooperative and no altered mental status    a.  Give 25 ml of D50 IV.   b.  Recheck CBG in 15 minutes after treatment.                             w         If CBG is less than 70 mg/dl, repeat treatment and recheck until hypoglycemia is resolved.   c.  Notify MD for further orders.             SPECIAL CONSIDERATIONS:    a.  If no IV access,                              w        Start IV of D5W at [facility]                             w         Give 25 ml of D50 IV.    b.  If unable to gain IV access                             w          Give Glucagon IM:     i.  1 mg if patient weighs more than 45.5 kg     ii.  0.5 mg if patient weighs less than 45.5 kg   c.  Notify MD for further orders  B. Treatment for Severe-- Patient unconscious or unable to take PO's safely    1.  Position patient on side   2.  Give 50 ml D50 IV   3.  Recheck CBG in 15 minutes.                    w      If CBG is less than 70 mg/dl, repeat treatment and recheck until hypoglycemia is resolved.   4.  Notify MD for further orders.    SPECIAL CONSIDERATIONS:    a.  If no IV access                              w     Give Glucagon IM                                              i.  1 mg if patient weighs more than 45.5 kg                                             ii.  0.5 mg if patient weighs less than 45.5 kg                              w      Start IV of D5W at 50 ml/hr and give 50 ml D50 IV   b.  If no IV access and active seizure                               w       Call Rapid Response   c.  If unable to gain IV access, give Glucagon IM:                              w          1 mg if patient weighs more than 45.5 kg  w          0.5 mg if patient weighs less than 45.5 kg   d.  Notify MD for further orders.  C. Complete smart text progress note to document intervention and follow-up CBG   1. In [facility] patient chart, click on Notes (left side of screen)   2. Create Progress Note   3. Click on [employer].  In the Match box type "hypo" and enter    4. Double click on CHL IP HYPOGLYCEMIC EVENT and enter data   5. MD must be notified if patient is NPO or experienced severe hypoglycemia

## 2013-10-10 NOTE — Care Management Note (Signed)
    Page 1 of 1   10/10/2013     2:17:38 PM CARE MANAGEMENT NOTE 10/10/2013  Patient:  [name], [name]   Account Number:  [id number]  Date Initiated:  10/08/2013  Documentation initiated by:  Lorne Skeens  Subjective/Objective Assessment:   Patient admitted with TIA symptoms, positive for UTI. Resides at [facility]     Action/Plan:   Will follow for discharge needs pending PT/OT evals and physician orders.   Anticipated DC Date:     Anticipated DC Plan:  SKILLED NURSING FACILITY  In-house referral  Clinical Social Worker         Choice offered to / List presented to:             Status of service:  In process, will continue to follow Medicare Important Message given?  YES (If response is "NO", the following Medicare IM given date fields will be blank) Date Medicare IM given:  10/10/2013 Date Additional Medicare IM given:  10/10/2013  Discharge Disposition:    Per UR Regulation:  Reviewed for med. necessity/level of care/duration of stay  If discussed at [place] of Stay Meetings, dates discussed:    Comments:  10/10/13 [place], MSN, CM- Met with patient to provide Medicare IM letter.

## 2013-10-10 NOTE — Progress Notes (Signed)
Hypoglycemic Event  CBG: 68  Treatment: 15 GM carbohydrate snack  Symptoms: None  Follow-up CBG: Time:0700 CBG Result:75 Possible Reasons for Event: Medication regimen: Lantus  Comments/MD notified:yes    Azell Der  Remember to initiate Hypoglycemia Order Set & complete

## 2013-10-10 NOTE — Discharge Summary (Signed)
Physician Discharge Summary  [name] IPP:[id number] DOB: Jun 18, 1934 DOA: 10/07/2013  PCP: Lupita Raider, MD  Admit date: 10/07/2013 Discharge date: 10/10/2013  Time spent: >35 minutes  Recommendations for Outpatient Follow-up:  SNF F/u with neurologist in 2 weeks F/u with PCP in 1 week post rehab  Discharge Diagnoses:  Principal Problem:   TIA (transient ischemic attack) Active Problems:   DM   COPD   Weakness generalized   CKD (chronic kidney disease) stage 3, GFR 30-59 ml/min   Ulcer of left heel   Discharge Condition: stable   Diet recommendation: DM  Filed Weights   10/08/13 0500  Weight: 85.7 kg (188 lb 15 oz)    History of present illness:  79 Y/o female with PMH of HTN, DM type II, CAD, PAD, CKD, COPD, bilateral macular degeneration, and hypothyroidism, brought in by ambulance as a code stroke due to L sided weakness, dysarthria  -resident at [facility], admitted to the medicine service on 10/07/2013 presenting as a code stroke. Patient noted by nursing home staff to have slurred speech and left upper extremity weakness. She was seen and evaluated by Dr. Leroy Kennedy of neurology, not felt to be candidate for thrombolytics. Had an IHSS score of 6 on arrival. MRI of brain negative for acute stroke. Moderate to advanced chronic microvascular ischemic change noted. On this mornings evaluation patient reports resolution to slurred speech however has some left upper extremity weakness.    Hospital Course:  1. TIA; MRI: Negative for acute infarct  -symptoms resolved; echo No clear embolism  -continue antiplatelet therapy with aspirin 325 mg by mouth daily and statin  2. Type 2 diabetes mellitus uncontrolled, HA1C-9.9  -increase insulin glargine 40 units subcutaneous daily; titrate per response    3. Left heel ulcer  -x ray: no osteomyelitis; cont wound care, OP f/u if not healing may need MRI 4. Poor functional status  -Patient reports being minimally ambulatory at  her facility due to bilateral extremity weakness  -Physical therapy: SNF  5. Stage III chronic kidney disease  -Baseline creatinine near 1.8 - 2.0  -stable  6. Urinary tract infection  -Patient having a positive UA on admission  -improved on antibiotics; afebrile, no leukocytosis; cont OP atx to complete the treatment  7. HTN uncontrolled;resume home medications; titrate per response  8. DJD, arthritis; Pt came on prn vicodin, tolerating well, no side effects     Procedures:  none (i.e. Studies not automatically included, echos, thoracentesis, etc; not x-rays)  Consultations:  Neurology   Discharge Exam: Filed Vitals:   10/10/13 0911  BP: 158/55  Pulse: 78  Temp: 98.7 F (37.1 C)  Resp: 18    General: alert Cardiovascular: s1,s2 rrr Respiratory: CTA BL  Discharge Instructions  Discharge Instructions   Diet - low sodium heart healthy    Complete by:  As directed      Discharge instructions    Complete by:  As directed   Please follow up with primary care doctor in 1 week post discharge     Increase activity slowly    Complete by:  As directed             Medication List    STOP taking these medications       aspirin 81 MG chewable tablet  Replaced by:  aspirin 325 MG tablet      TAKE these medications       acetaminophen 500 MG tablet  Commonly known as:  TYLENOL  Take 500 mg  by mouth every 4 (four) hours as needed for mild pain, fever or headache.     albuterol (2.5 MG/3ML) 0.083% nebulizer solution  Commonly known as:  PROVENTIL  Take 2.5 mg by nebulization every 6 (six) hours as needed for wheezing.     ALPRAZolam 0.25 MG tablet  Commonly known as:  XANAX  Take 1 tablet (0.25 mg total) by mouth 2 (two) times daily as needed for anxiety.     amitriptyline 50 MG tablet  Commonly known as:  ELAVIL  Take 50 mg by mouth at bedtime.     amLODipine 10 MG tablet  Commonly known as:  NORVASC  Take 10 mg by mouth every morning.     aspirin 325 MG  tablet  Take 1 tablet (325 mg total) by mouth daily.     budesonide-formoterol 160-4.5 MCG/ACT inhaler  Commonly known as:  SYMBICORT  Inhale 2 puffs into the lungs 2 (two) times daily.     carvedilol 25 MG tablet  Commonly known as:  COREG  Take 25 mg by mouth 2 (two) times daily with a meal. 0800 and 1700     cloNIDine 0.2 MG tablet  Commonly known as:  CATAPRES  Take 0.2 mg by mouth 2 (two) times daily. Hold if blood pressure less than 100/60     furosemide 40 MG tablet  Commonly known as:  LASIX  Take 40 mg by mouth daily.     gabapentin 100 MG capsule  Commonly known as:  NEURONTIN  Take 100-300 mg by mouth 3 (three) times daily. 1 capsule at 0800, 1 capsule at 1400, and 3 capsules at 2000.     guaiFENesin 100 MG/5ML Soln  Commonly known as:  ROBITUSSIN  Take 10 mLs by mouth every 4 (four) hours as needed for cough or to loosen phlegm.     HEALTHY EYES Tabs  Take 1 tablet by mouth every morning.     HYDROcodone-acetaminophen 5-325 MG per tablet  Commonly known as:  NORCO/VICODIN  Take 1 tablet by mouth every 8 (eight) hours as needed for moderate pain or severe pain.     insulin glargine 100 UNIT/ML injection  Commonly known as:  LANTUS  Inject 0.4 mLs (40 Units total) into the skin at bedtime.     insulin lispro 100 UNIT/ML injection  Commonly known as:  HUMALOG  Inject 0-14 Units into the skin 3 (three) times daily before meals. CBG greater than 350=14 units     levofloxacin 500 MG tablet  Commonly known as:  LEVAQUIN  Take 1 tablet (500 mg total) by mouth daily.     levothyroxine 50 MCG tablet  Commonly known as:  SYNTHROID, LEVOTHROID  Take 50 mcg by mouth every morning.     polyethylene glycol packet  Commonly known as:  MIRALAX / GLYCOLAX  Take 51 g by mouth daily as needed for mild constipation, moderate constipation or severe constipation.     rosuvastatin 20 MG tablet  Commonly known as:  CRESTOR  Take 20 mg by mouth every evening.      senna-docusate 8.6-50 MG per tablet  Commonly known as:  Senokot-S  Take 2 tablets by mouth at bedtime.     tiotropium 18 MCG inhalation capsule  Commonly known as:  SPIRIVA  Place 18 mcg into inhaler and inhale daily.       Allergies  Allergen Reactions  . Codeine Other (See Comments)    unknown  . Lisinopril Other (See Comments)  unknown  . Penicillins Hives  . Septra [Sulfamethoxazole-Trimethoprim]   . Sulfur Hives and Rash    Break out        Follow-up Information   Follow up with Xu,Jindong, MD. Schedule an appointment as soon as possible for a visit in 2 months. (Stroke Clinic)    Specialty:  Neurology   Contact information:   [address] [place] [address] [phone]       Follow up with Mayra Neer, MD In 1 week.   Specialty:  Family Medicine   Contact information:   [address] [place] [address] [phone]        The results of significant diagnostics from this hospitalization (including imaging, microbiology, ancillary and laboratory) are listed below for reference.    Significant Diagnostic Studies: Ct Head Wo Contrast  10/07/2013   CLINICAL DATA:  Code stroke.  Left-sided weakness.  Slurred speech.  EXAM: CT HEAD WITHOUT CONTRAST  TECHNIQUE: Contiguous axial images were obtained from the base of the skull through the vertex without intravenous contrast.  COMPARISON:  CT scan dated 10/31/2012  FINDINGS: There is no acute intracranial hemorrhage, infarction, or mass lesion. There are subtle old periventricular white matter lucencies consistent with small vessel ischemic disease. There is diffuse slight cerebral cortical atrophy with dilatation of the third and lateral ventricles which is stable. No osseous abnormality.  IMPRESSION: No acute intracranial abnormality.   Electronically Signed   By: Rozetta Nunnery M.D.   On: 10/07/2013 12:46   Mr Jodene Nam Head Wo Contrast  10/07/2013   CLINICAL DATA:  Stroke   EXAM: MRI HEAD WITHOUT CONTRAST  MRA HEAD WITHOUT CONTRAST  TECHNIQUE: Multiplanar, multiecho pulse sequences of the brain and surrounding structures were obtained without intravenous contrast. Angiographic images of the head were obtained using MRA technique without contrast.  COMPARISON:  CT head 10/07/2013  FINDINGS: MRI HEAD FINDINGS  Image quality degraded by mild motion.  Negative for acute infarct.  Generalized atrophy. Moderate to advanced chronic microvascular ischemic change throughout the cerebral white matter and pons.  Negative for hemorrhage or mass. No edema or shift of the midline structures.  Paranasal sinuses are clear.  MRA HEAD FINDINGS  Image quality degraded by motion.  Both vertebral arteries are patent to the basilar. Mild irregularity of the vertebral arteries. The basilar is widely patent. Fetal origin of the left posterior cerebral artery with hypoplastic left P1 segment. Posterior cerebral arteries are patent bilaterally.  Atherosclerotic irregularity in the cavernous carotid bilaterally with mild narrowing. Anterior and middle cerebral arteries are patent bilaterally. There is diffuse irregularity of the anterior and middle cerebral arteries. This is most likely due to atherosclerotic disease however these vessels are not evaluated in detail due to motion. There appears to be a moderate stenosis of left MCA bifurcation is severe stenosis of the right MCA bifurcation. .  IMPRESSION: Negative for acute infarct. Moderate to advanced chronic microvascular ischemic change.  MRA is degraded by motion. There appears to be diffuse intracranial atherosclerotic disease, most severe involving the MCA bifurcation, right greater than left.   Electronically Signed   By: Franchot Gallo M.D.   On: 10/07/2013 17:07   Mr Brain Wo Contrast  10/07/2013   CLINICAL DATA:  Stroke  EXAM: MRI HEAD WITHOUT CONTRAST  MRA HEAD WITHOUT CONTRAST  TECHNIQUE: Multiplanar, multiecho pulse sequences of the brain and  surrounding structures were obtained without intravenous contrast. Angiographic images of the head were obtained  using MRA technique without contrast.  COMPARISON:  CT head 10/07/2013  FINDINGS: MRI HEAD FINDINGS  Image quality degraded by mild motion.  Negative for acute infarct.  Generalized atrophy. Moderate to advanced chronic microvascular ischemic change throughout the cerebral white matter and pons.  Negative for hemorrhage or mass. No edema or shift of the midline structures.  Paranasal sinuses are clear.  MRA HEAD FINDINGS  Image quality degraded by motion.  Both vertebral arteries are patent to the basilar. Mild irregularity of the vertebral arteries. The basilar is widely patent. Fetal origin of the left posterior cerebral artery with hypoplastic left P1 segment. Posterior cerebral arteries are patent bilaterally.  Atherosclerotic irregularity in the cavernous carotid bilaterally with mild narrowing. Anterior and middle cerebral arteries are patent bilaterally. There is diffuse irregularity of the anterior and middle cerebral arteries. This is most likely due to atherosclerotic disease however these vessels are not evaluated in detail due to motion. There appears to be a moderate stenosis of left MCA bifurcation is severe stenosis of the right MCA bifurcation. .  IMPRESSION: Negative for acute infarct. Moderate to advanced chronic microvascular ischemic change.  MRA is degraded by motion. There appears to be diffuse intracranial atherosclerotic disease, most severe involving the MCA bifurcation, right greater than left.   Electronically Signed   By: Franchot Gallo M.D.   On: 10/07/2013 17:07   Dg Foot 2 Views Left  10/09/2013   CLINICAL DATA:  Lateral left heel wound that has not healed for 8 months. Diabetes.  EXAM: LEFT FOOT - 2 VIEW  COMPARISON:  03/18/2013  FINDINGS: The bones are diffusely osteopenic. No acute fracture or dislocation is seen. No osseous erosion or dissecting soft tissue emphysema  is identified. Soft tissue swelling about the ankle has decreased from the prior study. Diffuse vascular calcification is present.  IMPRESSION: Osteopenia.  No radiographic evidence active osteomyelitis.   Electronically Signed   By: Logan Bores   On: 10/09/2013 11:19    Microbiology: No results found for this or any previous visit (from the past 240 hour(s)).   Labs: Basic Metabolic Panel:  Recent Labs Lab 10/07/13 1231 10/07/13 1303 10/09/13 0428  NA 136* 139 143  K 4.9 4.8 4.3  CL 100 102 109  CO2 22  --  23  GLUCOSE 380* 379* 129*  BUN 44* 42* 34*  CREATININE 1.81* 1.90* 1.38*  CALCIUM 9.2  --  8.9   Liver Function Tests:  Recent Labs Lab 10/07/13 1231  AST 10  ALT 8  ALKPHOS 61  BILITOT 0.3  PROT 6.4  ALBUMIN 2.8*   No results found for this basename: LIPASE, AMYLASE,  in the last 168 hours No results found for this basename: AMMONIA,  in the last 168 hours CBC:  Recent Labs Lab 10/07/13 1231 10/07/13 1303 10/09/13 0428 10/10/13 0542  WBC 14.2*  --  7.6 11.4*  NEUTROABS 9.3*  --   --   --   HGB 10.2* 11.6* 9.2* 10.9*  HCT 32.1* 34.0* 29.0* 33.9*  MCV 90.9  --  92.1 90.6  PLT 188  --  183 233   Cardiac Enzymes: No results found for this basename: CKTOTAL, CKMB, CKMBINDEX, TROPONINI,  in the last 168 hours BNP: BNP (last 3 results) No results found for this basename: PROBNP,  in the last 8760 hours CBG:  Recent Labs Lab 10/09/13 1632 10/09/13 2108 10/10/13 0642 10/10/13 0701 10/10/13 0707  GLUCAP 152* 171* 65* 75 79  Signed:  Kinnie Feil  [facility] 10/10/2013, 9:56 AM

## 2013-10-10 NOTE — Progress Notes (Addendum)
Patient being d/ced to SNF this afternoon. Assessments remained unchanged prior to d/c. Education given to her on the s/s of stroke and the need for her to take her medications.Attempted to give report to the facility but The nurse was not available. Will try again.

## 2013-10-11 ENCOUNTER — Observation Stay (HOSPITAL_COMMUNITY)
Admission: EM | Admit: 2013-10-11 | Discharge: 2013-10-16 | Disposition: A | Discharge status: Skilled Nursing Facility | Payer: Medicare Other | Attending: Internal Medicine | Admitting: Internal Medicine

## 2013-10-11 ENCOUNTER — Encounter (HOSPITAL_COMMUNITY): Payer: Self-pay | Admitting: Emergency Medicine

## 2013-10-11 ENCOUNTER — Emergency Department (HOSPITAL_COMMUNITY): Payer: Medicare Other

## 2013-10-11 DIAGNOSIS — R1110 Vomiting, unspecified: Secondary | ICD-10-CM

## 2013-10-11 DIAGNOSIS — Z880 Allergy status to penicillin: Secondary | ICD-10-CM | POA: Diagnosis not present

## 2013-10-11 DIAGNOSIS — E119 Type 2 diabetes mellitus without complications: Secondary | ICD-10-CM

## 2013-10-11 DIAGNOSIS — H3530 Unspecified macular degeneration: Secondary | ICD-10-CM | POA: Insufficient documentation

## 2013-10-11 DIAGNOSIS — N183 Chronic kidney disease, stage 3 unspecified: Secondary | ICD-10-CM

## 2013-10-11 DIAGNOSIS — K8020 Calculus of gallbladder without cholecystitis without obstruction: Secondary | ICD-10-CM | POA: Diagnosis not present

## 2013-10-11 DIAGNOSIS — G459 Transient cerebral ischemic attack, unspecified: Secondary | ICD-10-CM

## 2013-10-11 DIAGNOSIS — J441 Chronic obstructive pulmonary disease with (acute) exacerbation: Secondary | ICD-10-CM

## 2013-10-11 DIAGNOSIS — Z794 Long term (current) use of insulin: Secondary | ICD-10-CM | POA: Insufficient documentation

## 2013-10-11 DIAGNOSIS — F411 Generalized anxiety disorder: Secondary | ICD-10-CM | POA: Diagnosis not present

## 2013-10-11 DIAGNOSIS — Z8711 Personal history of peptic ulcer disease: Secondary | ICD-10-CM | POA: Insufficient documentation

## 2013-10-11 DIAGNOSIS — R112 Nausea with vomiting, unspecified: Secondary | ICD-10-CM | POA: Diagnosis not present

## 2013-10-11 DIAGNOSIS — E43 Unspecified severe protein-calorie malnutrition: Secondary | ICD-10-CM

## 2013-10-11 DIAGNOSIS — I129 Hypertensive chronic kidney disease with stage 1 through stage 4 chronic kidney disease, or unspecified chronic kidney disease: Secondary | ICD-10-CM | POA: Insufficient documentation

## 2013-10-11 DIAGNOSIS — R531 Weakness: Secondary | ICD-10-CM

## 2013-10-11 DIAGNOSIS — E1142 Type 2 diabetes mellitus with diabetic polyneuropathy: Secondary | ICD-10-CM | POA: Diagnosis not present

## 2013-10-11 DIAGNOSIS — R4182 Altered mental status, unspecified: Secondary | ICD-10-CM

## 2013-10-11 DIAGNOSIS — F17200 Nicotine dependence, unspecified, uncomplicated: Secondary | ICD-10-CM | POA: Insufficient documentation

## 2013-10-11 DIAGNOSIS — E785 Hyperlipidemia, unspecified: Secondary | ICD-10-CM | POA: Insufficient documentation

## 2013-10-11 DIAGNOSIS — E1149 Type 2 diabetes mellitus with other diabetic neurological complication: Secondary | ICD-10-CM | POA: Diagnosis not present

## 2013-10-11 DIAGNOSIS — M545 Low back pain, unspecified: Secondary | ICD-10-CM

## 2013-10-11 DIAGNOSIS — R109 Unspecified abdominal pain: Secondary | ICD-10-CM | POA: Diagnosis not present

## 2013-10-11 DIAGNOSIS — Z8673 Personal history of transient ischemic attack (TIA), and cerebral infarction without residual deficits: Secondary | ICD-10-CM | POA: Insufficient documentation

## 2013-10-11 DIAGNOSIS — I739 Peripheral vascular disease, unspecified: Secondary | ICD-10-CM | POA: Insufficient documentation

## 2013-10-11 DIAGNOSIS — I5189 Other ill-defined heart diseases: Secondary | ICD-10-CM

## 2013-10-11 DIAGNOSIS — R1310 Dysphagia, unspecified: Secondary | ICD-10-CM

## 2013-10-11 DIAGNOSIS — J449 Chronic obstructive pulmonary disease, unspecified: Secondary | ICD-10-CM | POA: Diagnosis present

## 2013-10-11 DIAGNOSIS — E1165 Type 2 diabetes mellitus with hyperglycemia: Secondary | ICD-10-CM | POA: Insufficient documentation

## 2013-10-11 DIAGNOSIS — R1011 Right upper quadrant pain: Secondary | ICD-10-CM | POA: Diagnosis not present

## 2013-10-11 DIAGNOSIS — Z6832 Body mass index (BMI) 32.0-32.9, adult: Secondary | ICD-10-CM | POA: Insufficient documentation

## 2013-10-11 DIAGNOSIS — N390 Urinary tract infection, site not specified: Secondary | ICD-10-CM

## 2013-10-11 DIAGNOSIS — R609 Edema, unspecified: Secondary | ICD-10-CM

## 2013-10-11 DIAGNOSIS — R5381 Other malaise: Secondary | ICD-10-CM

## 2013-10-11 DIAGNOSIS — R0902 Hypoxemia: Secondary | ICD-10-CM

## 2013-10-11 DIAGNOSIS — D649 Anemia, unspecified: Secondary | ICD-10-CM | POA: Diagnosis not present

## 2013-10-11 DIAGNOSIS — N179 Acute kidney failure, unspecified: Secondary | ICD-10-CM

## 2013-10-11 DIAGNOSIS — K259 Gastric ulcer, unspecified as acute or chronic, without hemorrhage or perforation: Principal | ICD-10-CM | POA: Insufficient documentation

## 2013-10-11 DIAGNOSIS — N189 Chronic kidney disease, unspecified: Secondary | ICD-10-CM

## 2013-10-11 DIAGNOSIS — Z79899 Other long term (current) drug therapy: Secondary | ICD-10-CM | POA: Insufficient documentation

## 2013-10-11 DIAGNOSIS — E1129 Type 2 diabetes mellitus with other diabetic kidney complication: Secondary | ICD-10-CM | POA: Insufficient documentation

## 2013-10-11 DIAGNOSIS — E7800 Pure hypercholesterolemia, unspecified: Secondary | ICD-10-CM | POA: Diagnosis not present

## 2013-10-11 DIAGNOSIS — R1013 Epigastric pain: Secondary | ICD-10-CM | POA: Diagnosis not present

## 2013-10-11 DIAGNOSIS — G9340 Encephalopathy, unspecified: Secondary | ICD-10-CM

## 2013-10-11 DIAGNOSIS — R5383 Other fatigue: Secondary | ICD-10-CM

## 2013-10-11 DIAGNOSIS — N12 Tubulo-interstitial nephritis, not specified as acute or chronic: Secondary | ICD-10-CM

## 2013-10-11 DIAGNOSIS — E039 Hypothyroidism, unspecified: Secondary | ICD-10-CM

## 2013-10-11 DIAGNOSIS — I10 Essential (primary) hypertension: Secondary | ICD-10-CM

## 2013-10-11 DIAGNOSIS — W19XXXA Unspecified fall, initial encounter: Secondary | ICD-10-CM

## 2013-10-11 DIAGNOSIS — N058 Unspecified nephritic syndrome with other morphologic changes: Secondary | ICD-10-CM | POA: Diagnosis not present

## 2013-10-11 DIAGNOSIS — L97429 Non-pressure chronic ulcer of left heel and midfoot with unspecified severity: Secondary | ICD-10-CM

## 2013-10-11 DIAGNOSIS — I2510 Atherosclerotic heart disease of native coronary artery without angina pectoris: Secondary | ICD-10-CM

## 2013-10-11 DIAGNOSIS — E162 Hypoglycemia, unspecified: Secondary | ICD-10-CM

## 2013-10-11 DIAGNOSIS — E875 Hyperkalemia: Secondary | ICD-10-CM

## 2013-10-11 DIAGNOSIS — R0602 Shortness of breath: Secondary | ICD-10-CM

## 2013-10-11 DIAGNOSIS — R7989 Other specified abnormal findings of blood chemistry: Secondary | ICD-10-CM

## 2013-10-11 DIAGNOSIS — S0121XA Laceration without foreign body of nose, initial encounter: Secondary | ICD-10-CM

## 2013-10-11 DIAGNOSIS — J4489 Other specified chronic obstructive pulmonary disease: Secondary | ICD-10-CM

## 2013-10-11 LAB — CBC WITH DIFFERENTIAL/PLATELET
BASOS ABS: 0 10*3/uL (ref 0.0–0.1)
BASOS PCT: 0 % (ref 0–1)
EOS ABS: 0 10*3/uL (ref 0.0–0.7)
Eosinophils Relative: 0 % (ref 0–5)
HCT: 39.5 % (ref 36.0–46.0)
HEMOGLOBIN: 12.6 g/dL (ref 12.0–15.0)
Lymphocytes Relative: 16 % (ref 12–46)
Lymphs Abs: 2.4 10*3/uL (ref 0.7–4.0)
MCH: 28.8 pg (ref 26.0–34.0)
MCHC: 31.9 g/dL (ref 30.0–36.0)
MCV: 90.4 fL (ref 78.0–100.0)
Monocytes Absolute: 1.1 10*3/uL — ABNORMAL HIGH (ref 0.1–1.0)
Monocytes Relative: 8 % (ref 3–12)
NEUTROS PCT: 76 % (ref 43–77)
Neutro Abs: 11.1 10*3/uL — ABNORMAL HIGH (ref 1.7–7.7)
PLATELETS: 246 10*3/uL (ref 150–400)
RBC: 4.37 MIL/uL (ref 3.87–5.11)
RDW: 13.2 % (ref 11.5–15.5)
WBC: 14.6 10*3/uL — ABNORMAL HIGH (ref 4.0–10.5)

## 2013-10-11 LAB — COMPREHENSIVE METABOLIC PANEL
ALBUMIN: 3.1 g/dL — AB (ref 3.5–5.2)
ALK PHOS: 73 U/L (ref 39–117)
ALT: 10 U/L (ref 0–35)
AST: 13 U/L (ref 0–37)
BILIRUBIN TOTAL: 0.2 mg/dL — AB (ref 0.3–1.2)
BUN: 20 mg/dL (ref 6–23)
CO2: 24 mEq/L (ref 19–32)
Calcium: 9.9 mg/dL (ref 8.4–10.5)
Chloride: 105 mEq/L (ref 96–112)
Creatinine, Ser: 1.14 mg/dL — ABNORMAL HIGH (ref 0.50–1.10)
GFR calc Af Amer: 53 mL/min — ABNORMAL LOW (ref >90)
GFR calc non Af Amer: 46 mL/min — ABNORMAL LOW (ref >90)
Glucose, Bld: 260 mg/dL — ABNORMAL HIGH (ref 70–99)
POTASSIUM: 4.9 meq/L (ref 3.7–5.3)
SODIUM: 143 meq/L (ref 137–147)
TOTAL PROTEIN: 7.2 g/dL (ref 6.0–8.3)

## 2013-10-11 LAB — URINE MICROSCOPIC-ADD ON

## 2013-10-11 LAB — URINALYSIS, ROUTINE W REFLEX MICROSCOPIC
BILIRUBIN URINE: NEGATIVE
GLUCOSE, UA: 500 mg/dL — AB
KETONES UR: 15 mg/dL — AB
Nitrite: NEGATIVE
PH: 6 (ref 5.0–8.0)
Specific Gravity, Urine: 1.021 (ref 1.005–1.030)
Urobilinogen, UA: 0.2 mg/dL (ref 0.0–1.0)

## 2013-10-11 LAB — TROPONIN I: Troponin I: <0.3 ng/mL (ref <0.30)

## 2013-10-11 LAB — GLUCOSE, CAPILLARY
Glucose-Capillary: 253 mg/dL — ABNORMAL HIGH (ref 70–99)
Glucose-Capillary: 232 mg/dL — ABNORMAL HIGH (ref 70–99)

## 2013-10-11 LAB — LIPASE, BLOOD: Lipase: 15 U/L (ref 11–59)

## 2013-10-11 MED ORDER — CARVEDILOL 25 MG PO TABS
25.0000 mg | ORAL_TABLET | Freq: Two times a day (BID) | ORAL | Status: DC
  Administered 2013-10-11 – 2013-10-16 (×11): 25 mg via ORAL
  Filled 2013-10-11 (×16): qty 1

## 2013-10-11 MED ORDER — INSULIN ASPART 100 UNIT/ML ~~LOC~~ SOLN
0.0000 [IU] | Freq: Three times a day (TID) | SUBCUTANEOUS | Status: DC
  Administered 2013-10-11: 3 [IU] via SUBCUTANEOUS
  Administered 2013-10-11: 5 [IU] via SUBCUTANEOUS
  Administered 2013-10-12 – 2013-10-14 (×4): 2 [IU] via SUBCUTANEOUS
  Administered 2013-10-15: 5 [IU] via SUBCUTANEOUS
  Administered 2013-10-15: 7 [IU] via SUBCUTANEOUS
  Administered 2013-10-16: 5 [IU] via SUBCUTANEOUS
  Administered 2013-10-16: 3 [IU] via SUBCUTANEOUS

## 2013-10-11 MED ORDER — BUDESONIDE-FORMOTEROL FUMARATE 160-4.5 MCG/ACT IN AERO
2.0000 | INHALATION_SPRAY | Freq: Two times a day (BID) | RESPIRATORY_TRACT | Status: DC
  Administered 2013-10-11 – 2013-10-16 (×10): 2 via RESPIRATORY_TRACT
  Filled 2013-10-11: qty 6

## 2013-10-11 MED ORDER — ENOXAPARIN SODIUM 40 MG/0.4ML ~~LOC~~ SOLN
40.0000 mg | Freq: Every day | SUBCUTANEOUS | Status: DC
  Administered 2013-10-11 – 2013-10-16 (×6): 40 mg via SUBCUTANEOUS
  Filled 2013-10-11 (×6): qty 0.4

## 2013-10-11 MED ORDER — ALUM & MAG HYDROXIDE-SIMETH 200-200-20 MG/5ML PO SUSP: 30.0000 mL | Freq: Four times a day (QID) | ORAL | Status: DC | PRN

## 2013-10-11 MED ORDER — ONDANSETRON HCL 4 MG PO TABS: 4.0000 mg | ORAL_TABLET | Freq: Four times a day (QID) | ORAL | Status: DC | PRN

## 2013-10-11 MED ORDER — INSULIN GLARGINE 100 UNIT/ML ~~LOC~~ SOLN
25.0000 [IU] | Freq: Every day | SUBCUTANEOUS | Status: DC
  Administered 2013-10-11 – 2013-10-16 (×4): 25 [IU] via SUBCUTANEOUS
  Filled 2013-10-11 (×6): qty 0.25

## 2013-10-11 MED ORDER — CLONIDINE HCL 0.2 MG PO TABS
0.2000 mg | ORAL_TABLET | Freq: Two times a day (BID) | ORAL | Status: DC
  Administered 2013-10-11 – 2013-10-16 (×11): 0.2 mg via ORAL
  Filled 2013-10-11 (×12): qty 1

## 2013-10-11 MED ORDER — SODIUM CHLORIDE 0.9 % IV BOLUS (SEPSIS)
500.0000 mL | Freq: Once | INTRAVENOUS | Status: AC
  Administered 2013-10-11: 500 mL via INTRAVENOUS

## 2013-10-11 MED ORDER — ACETAMINOPHEN 650 MG RE SUPP: 650.0000 mg | Freq: Four times a day (QID) | RECTAL | Status: DC | PRN

## 2013-10-11 MED ORDER — AMITRIPTYLINE HCL 50 MG PO TABS
50.0000 mg | ORAL_TABLET | Freq: Every day | ORAL | Status: DC
  Administered 2013-10-11 – 2013-10-15 (×5): 50 mg via ORAL
  Filled 2013-10-11 (×8): qty 1

## 2013-10-11 MED ORDER — ALBUTEROL SULFATE (2.5 MG/3ML) 0.083% IN NEBU: 2.5000 mg | INHALATION_SOLUTION | Freq: Four times a day (QID) | RESPIRATORY_TRACT | Status: DC | PRN

## 2013-10-11 MED ORDER — FUROSEMIDE 40 MG PO TABS
40.0000 mg | ORAL_TABLET | Freq: Every day | ORAL | Status: DC
  Administered 2013-10-12: 40 mg via ORAL
  Filled 2013-10-11: qty 1

## 2013-10-11 MED ORDER — LEVOTHYROXINE SODIUM 50 MCG PO TABS
50.0000 ug | ORAL_TABLET | Freq: Every morning | ORAL | Status: DC
  Administered 2013-10-11 – 2013-10-16 (×6): 50 ug via ORAL
  Filled 2013-10-11 (×6): qty 1

## 2013-10-11 MED ORDER — MORPHINE SULFATE 2 MG/ML IJ SOLN
2.0000 mg | INTRAMUSCULAR | Status: DC | PRN
  Administered 2013-10-11 – 2013-10-16 (×6): 2 mg via INTRAVENOUS
  Filled 2013-10-11 (×6): qty 1

## 2013-10-11 MED ORDER — SODIUM CHLORIDE 0.9 % IV SOLN
INTRAVENOUS | Status: DC
  Administered 2013-10-11: 09:00:00 via INTRAVENOUS

## 2013-10-11 MED ORDER — POLYETHYLENE GLYCOL 3350 17 G PO PACK
17.0000 g | PACK | Freq: Every day | ORAL | Status: DC | PRN
  Filled 2013-10-11: qty 1

## 2013-10-11 MED ORDER — LEVOFLOXACIN 500 MG PO TABS
500.0000 mg | ORAL_TABLET | Freq: Every day | ORAL | Status: DC
  Administered 2013-10-11 – 2013-10-12 (×2): 500 mg via ORAL
  Filled 2013-10-11 (×2): qty 1

## 2013-10-11 MED ORDER — ONDANSETRON HCL 4 MG/2ML IJ SOLN
4.0000 mg | Freq: Once | INTRAMUSCULAR | Status: AC
  Administered 2013-10-11: 4 mg via INTRAVENOUS
  Filled 2013-10-11: qty 2

## 2013-10-11 MED ORDER — ONDANSETRON HCL 4 MG/2ML IJ SOLN
4.0000 mg | Freq: Four times a day (QID) | INTRAMUSCULAR | Status: DC | PRN
  Administered 2013-10-12 – 2013-10-13 (×2): 4 mg via INTRAVENOUS
  Filled 2013-10-11 (×2): qty 2

## 2013-10-11 MED ORDER — GABAPENTIN 100 MG PO CAPS: 100.0000 mg | ORAL_CAPSULE | Freq: Three times a day (TID) | ORAL | Status: DC

## 2013-10-11 MED ORDER — DEXTROSE 5 % IV SOLN
1.0000 g | Freq: Once | INTRAVENOUS | Status: AC
  Administered 2013-10-11: 1 g via INTRAVENOUS
  Filled 2013-10-11: qty 10

## 2013-10-11 MED ORDER — ATORVASTATIN CALCIUM 40 MG PO TABS
40.0000 mg | ORAL_TABLET | Freq: Every day | ORAL | Status: DC
  Administered 2013-10-11 – 2013-10-15 (×5): 40 mg via ORAL
  Filled 2013-10-11 (×6): qty 1

## 2013-10-11 MED ORDER — ALPRAZOLAM 0.25 MG PO TABS
0.2500 mg | ORAL_TABLET | Freq: Two times a day (BID) | ORAL | Status: DC | PRN
  Administered 2013-10-13: 0.25 mg via ORAL
  Filled 2013-10-11: qty 1

## 2013-10-11 MED ORDER — ASPIRIN 325 MG PO TABS
325.0000 mg | ORAL_TABLET | Freq: Every day | ORAL | Status: DC
  Administered 2013-10-11 – 2013-10-16 (×6): 325 mg via ORAL
  Filled 2013-10-11 (×6): qty 1

## 2013-10-11 MED ORDER — MORPHINE SULFATE 4 MG/ML IJ SOLN
4.0000 mg | Freq: Once | INTRAMUSCULAR | Status: AC
  Administered 2013-10-11: 4 mg via INTRAVENOUS
  Filled 2013-10-11: qty 1

## 2013-10-11 MED ORDER — PANTOPRAZOLE SODIUM 40 MG IV SOLR
40.0000 mg | INTRAVENOUS | Status: DC
  Administered 2013-10-11 – 2013-10-15 (×5): 40 mg via INTRAVENOUS
  Filled 2013-10-11 (×5): qty 40

## 2013-10-11 MED ORDER — INSULIN ASPART 100 UNIT/ML ~~LOC~~ SOLN
0.0000 [IU] | Freq: Every day | SUBCUTANEOUS | Status: DC
  Administered 2013-10-14 – 2013-10-15 (×2): 2 [IU] via SUBCUTANEOUS

## 2013-10-11 MED ORDER — TIOTROPIUM BROMIDE MONOHYDRATE 18 MCG IN CAPS
18.0000 ug | ORAL_CAPSULE | Freq: Every day | RESPIRATORY_TRACT | Status: DC
  Administered 2013-10-11 – 2013-10-16 (×5): 18 ug via RESPIRATORY_TRACT
  Filled 2013-10-11 (×2): qty 5

## 2013-10-11 MED ORDER — SODIUM CHLORIDE 0.9 % IV SOLN
INTRAVENOUS | Status: AC
  Administered 2013-10-12: 01:00:00 via INTRAVENOUS

## 2013-10-11 MED ORDER — GABAPENTIN 300 MG PO CAPS
300.0000 mg | ORAL_CAPSULE | Freq: Every day | ORAL | Status: DC
  Administered 2013-10-11 – 2013-10-15 (×5): 300 mg via ORAL
  Filled 2013-10-11 (×6): qty 1

## 2013-10-11 MED ORDER — SENNOSIDES-DOCUSATE SODIUM 8.6-50 MG PO TABS
2.0000 | ORAL_TABLET | Freq: Every day | ORAL | Status: DC
  Administered 2013-10-11 – 2013-10-15 (×5): 2 via ORAL
  Filled 2013-10-11 (×7): qty 2

## 2013-10-11 MED ORDER — GABAPENTIN 100 MG PO CAPS
100.0000 mg | ORAL_CAPSULE | Freq: Two times a day (BID) | ORAL | Status: DC
  Administered 2013-10-11 – 2013-10-16 (×11): 100 mg via ORAL
  Filled 2013-10-11 (×12): qty 1

## 2013-10-11 MED ORDER — ACETAMINOPHEN 325 MG PO TABS
650.0000 mg | ORAL_TABLET | Freq: Four times a day (QID) | ORAL | Status: DC | PRN
  Administered 2013-10-13: 650 mg via ORAL
  Filled 2013-10-11: qty 2

## 2013-10-11 MED ORDER — AMLODIPINE BESYLATE 10 MG PO TABS
10.0000 mg | ORAL_TABLET | Freq: Every morning | ORAL | Status: DC
  Administered 2013-10-11 – 2013-10-16 (×6): 10 mg via ORAL
  Filled 2013-10-11 (×6): qty 1

## 2013-10-11 NOTE — H&P (Signed)
History and Physical  [name] NUU:[id number] DOB: 09-12-1934 DOA: 10/11/2013  Referring physician: EDP PCP: No primary provider on file.  Outpatient Specialists:  1. Unknown.  Chief Complaint: Nausea, vomiting and abdominal pain  HPI: [name] is a 79 y.o. female 79 year old female, resident of [facility], past medical history of hypertension, type II DM with renal complications, CAD, PAD, stage III chronic kidney disease, COPD, tobacco abuse, bilateral macular degeneration, hypothyroidism, recently hospitalized between 5/25-5/28 for TIA & enterococcus UTI, was discharged from [facility] on 5/28 and returned on 5/29 with complaints of nausea, vomiting and abdominal pain. Patient states that she reached the nursing facility at about 3:30 PM and within half an hour she started experiencing epigastric abdominal pain associated with nausea and vomiting. She describes the abdominal pain is sharp, intermittent, at times radiating to the right upper quadrant, no clear precipitating or relieving factors. This was associated with 3 episodes of nonbloody emesis which she describes as dark green/brown in color. The emesis occurred between 4 and 6 PM and she states that she has not vomited since. She gives history of PUD remotely diagnosed by EGD. She denies history of NSAID use. Denies reflux symptoms. No reported fever or chills. No sickly contacts with similar symptoms or history of eating anything unusual. She continues to complain of dysuria. She was treated in the hospital with 4 days of IV Rocephin but has not started oral levofloxacin. In the ED, afebrile, mild leukocytosis and UA suggestive of UTI. Ultrasound abdomen shows cholelithiasis. Lipase is normal. Hospitalist admission requested.   Review of Systems: All systems reviewed and apart from history of presenting illness, are negative.  Past Medical History  Diagnosis Date  . Hypertension   . Macular degeneration,  bilateral   . Type II diabetes mellitus   . COPD (chronic obstructive pulmonary disease)   . Diabetic peripheral neuropathy   . Hypercholesteremia   . PAD (peripheral artery disease)   . Chronic kidney disease   . Headache(784.0)   . Hypothyroid   . Coronary artery disease    Past Surgical History  Procedure Laterality Date  . Tonsillectomy  1940's  . Abdominal hysterectomy  1970's  . Dilation and curettage of uterus  1970's    "probably 2" (11/01/2012)  . Cataract extraction w/ intraocular lens  implant, bilateral Bilateral ~ 2010  . Bypass graft Right ~ 1997    RLE by Dr. Gwenlyn Perking 11/29/2005 (11/01/2012)  . Cystostomy w/ bladder biopsy  2005    Archie Endo 09/19/2003 (11/01/2012)  . Cardiac catheterization  11/29/2005    Archie Endo 11/29/2005 (11/01/2012)  . Anterior cervical decomp/discectomy fusion  01/02/2006    Archie Endo 01/02/2006 (11/01/2012)  . Shoulder open rotator cuff repair Bilateral 1990's-2000's    "3X on the left; twice on the right" (11/01/2012)   Social History:  reports that she has been smoking Cigarettes.  She has a 7.2 pack-year smoking history. She has never used smokeless tobacco. She reports that she does not drink alcohol or use illicit drugs. Widowed. Smokes 4 cigarettes per day but has not smoked for 2 weeks according to patient.  Allergies  Allergen Reactions  . Codeine Other (See Comments)    unknown  . Lisinopril Other (See Comments)    unknown  . Penicillins Hives  . Septra [Sulfamethoxazole-Trimethoprim] Hives and Rash  . Sulfur Hives and Rash    Break out     No family history on file. negative family history.   Prior  to Admission medications   Medication Sig Start Date End Date Taking? Authorizing Provider  acetaminophen (TYLENOL) 500 MG tablet Take 500 mg by mouth every 4 (four) hours as needed for mild pain, fever or headache.   Yes Historical Provider, MD  albuterol (PROVENTIL) (2.5 MG/3ML) 0.083% nebulizer solution Take 2.5 mg by nebulization every 6  (six) hours as needed for wheezing.   Yes Historical Provider, MD  amitriptyline (ELAVIL) 50 MG tablet Take 50 mg by mouth at bedtime.   Yes Historical Provider, MD  amLODipine (NORVASC) 10 MG tablet Take 10 mg by mouth every morning.    Yes Historical Provider, MD  aspirin 325 MG tablet Take 1 tablet (325 mg total) by mouth daily. 10/10/13  Yes Kinnie Feil, MD  budesonide-formoterol (SYMBICORT) 160-4.5 MCG/ACT inhaler Inhale 2 puffs into the lungs 2 (two) times daily.   Yes Historical Provider, MD  carvedilol (COREG) 25 MG tablet Take 25 mg by mouth 2 (two) times daily with a meal. 0800 and 1700   Yes Historical Provider, MD  cloNIDine (CATAPRES) 0.2 MG tablet Take 0.2 mg by mouth 2 (two) times daily. Hold if blood pressure less than 100/60   Yes Historical Provider, MD  furosemide (LASIX) 40 MG tablet Take 40 mg by mouth daily.   Yes Historical Provider, MD  gabapentin (NEURONTIN) 100 MG capsule Take 100-300 mg by mouth 3 (three) times daily. 1 capsule at 0800, 1 capsule at 1400, and 3 capsules at 2000.   Yes Historical Provider, MD  guaiFENesin (ROBITUSSIN) 100 MG/5ML SOLN Take 10 mLs by mouth every 4 (four) hours as needed for cough or to loosen phlegm.   Yes Historical Provider, MD  HYDROcodone-acetaminophen (NORCO/VICODIN) 5-325 MG per tablet Take 1 tablet by mouth every 8 (eight) hours as needed for moderate pain or severe pain. 10/10/13  Yes Kinnie Feil, MD  insulin glargine (LANTUS) 100 UNIT/ML injection Inject 0.4 mLs (40 Units total) into the skin at bedtime. 10/10/13  Yes Kinnie Feil, MD  insulin lispro (HUMALOG) 100 UNIT/ML injection Inject 0-14 Units into the skin 3 (three) times daily before meals. CBG greater than 350=14 units   Yes Historical Provider, MD  levothyroxine (SYNTHROID, LEVOTHROID) 50 MCG tablet Take 50 mcg by mouth every morning.    Yes Historical Provider, MD  Multiple Vitamins-Minerals (HEALTHY EYES) TABS Take 1 tablet by mouth every morning.    Yes  Historical Provider, MD  polyethylene glycol (MIRALAX / GLYCOLAX) packet Take 17 g by mouth daily as needed for mild constipation, moderate constipation or severe constipation.    Yes Historical Provider, MD  promethazine (PHENERGAN) 25 MG tablet Take 25 mg by mouth every 6 (six) hours as needed for nausea or vomiting.   Yes Historical Provider, MD  rosuvastatin (CRESTOR) 20 MG tablet Take 20 mg by mouth every evening.    Yes Historical Provider, MD  senna-docusate (SENOKOT-S) 8.6-50 MG per tablet Take 2 tablets by mouth at bedtime.    Yes Historical Provider, MD  tiotropium (SPIRIVA) 18 MCG inhalation capsule Place 18 mcg into inhaler and inhale daily.   Yes Historical Provider, MD  ALPRAZolam (XANAX) 0.25 MG tablet Take 1 tablet (0.25 mg total) by mouth 2 (two) times daily as needed for anxiety. 10/10/13   Kinnie Feil, MD  levofloxacin (LEVAQUIN) 500 MG tablet Take 1 tablet (500 mg total) by mouth daily. 10/10/13   Kinnie Feil, MD   Physical Exam: Filed Vitals:   10/11/13 0344  BP: 175/74  Pulse: 101  Temp: 98.8 F (37.1 C)  TempSrc: Oral  Resp: 20  SpO2: 95%     General exam: Moderately built and nourished elderly female patient, lying comfortably supine on the gurney in no obvious distress. Does not look septic or toxic.  Head, eyes and ENT: Nontraumatic and normocephalic. Pupils equally reacting to light and accommodation. Oral mucosa mildly dry.  Neck: Supple. No JVD, carotid bruit or thyromegaly.  Lymphatics: No lymphadenopathy.  Respiratory system: Clear to auscultation. No increased work of breathing.  Cardiovascular system: S1 and S2 heard, RRR. No JVD, murmurs, gallops, clicks or pedal edema.  Gastrointestinal system: Abdomen is nondistended, soft. Mild epigastric tenderness without rigidity, guarding or rebound. Normal bowel sounds heard. No organomegaly or masses appreciated.  Central nervous system: Alert and oriented. No focal neurological  deficits.  Extremities: Symmetric 5 x 5 power. Peripheral pulses symmetrically felt.   Skin: No rashes or acute findings.  Musculoskeletal system: Negative exam.  Psychiatry: Pleasant and cooperative.   Labs on Admission:  Basic Metabolic Panel:  Recent Labs Lab 10/07/13 1231 10/07/13 1303 10/09/13 0428 10/11/13 0400  NA 136* 139 143 143  K 4.9 4.8 4.3 4.9  CL 100 102 109 105  CO2 22  --  23 24  GLUCOSE 380* 379* 129* 260*  BUN 44* 42* 34* 20  CREATININE 1.81* 1.90* 1.38* 1.14*  CALCIUM 9.2  --  8.9 9.9   Liver Function Tests:  Recent Labs Lab 10/07/13 1231 10/11/13 0400  AST 10 13  ALT 8 10  ALKPHOS 61 73  BILITOT 0.3 0.2*  PROT 6.4 7.2  ALBUMIN 2.8* 3.1*    Recent Labs Lab 10/11/13 0400  LIPASE 15   No results found for this basename: AMMONIA,  in the last 168 hours CBC:  Recent Labs Lab 10/07/13 1231 10/07/13 1303 10/09/13 0428 10/10/13 0542 10/11/13 0400  WBC 14.2*  --  7.6 11.4* 14.6*  NEUTROABS 9.3*  --   --   --  11.1*  HGB 10.2* 11.6* 9.2* 10.9* 12.6  HCT 32.1* 34.0* 29.0* 33.9* 39.5  MCV 90.9  --  92.1 90.6 90.4  PLT 188  --  183 233 246   Cardiac Enzymes:  Recent Labs Lab 10/11/13 0400  TROPONINI <0.30    BNP (last 3 results) No results found for this basename: PROBNP,  in the last 8760 hours CBG:  Recent Labs Lab 10/09/13 2108 10/10/13 0642 10/10/13 0701 10/10/13 0707 10/10/13 1119  GLUCAP 171* 65* 75 79 158*    Radiological Exams on Admission: US Abdomen Complete  10/11/2013   CLINICAL DATA:  Epigastric abdominal pain.  EXAM: ULTRASOUND ABDOMEN COMPLETE  COMPARISON:  Renal ultrasound performed 06/21/2013  FINDINGS: Gallbladder:  A few tiny stones are seen dependently within the gallbladder; the gallbladder is otherwise unremarkable in appearance. No gallbladder wall thickening or pericholecystic fluid is seen. No ultrasonographic Murphy's sign is elicited.  Common bile duct:  Diameter: 0.6 cm, within normal limits in  caliber.  Liver:  No focal lesion identified. Within normal limits in parenchymal echogenicity.  IVC:  No abnormality visualized.  Pancreas:  There is question of areas of mildly decreased pancreatic echogenicity, which could reflect mild edema.  Spleen:  Size and appearance within normal limits.  Right Kidney:  Length: 10.0 cm. Echogenicity within normal limits. No mass or hydronephrosis visualized.  Left Kidney:  Length: 9.6 cm. Echogenicity within normal limits. No mass or hydronephrosis visualized.  Abdominal aorta:  No aneurysm visualized.  Other findings:  None.  IMPRESSION: 1. Question of areas of mildly decreased pancreatic echogenicity, which could reflect mild edema. Would correlate with lipase. 2. Cholelithiasis; gallbladder otherwise unremarkable in appearance.   Electronically Signed   By: Garald Balding M.D.   On: 10/11/2013 06:10   Dg Foot 2 Views Left  10/09/2013   CLINICAL DATA:  Lateral left heel wound that has not healed for 8 months. Diabetes.  EXAM: LEFT FOOT - 2 VIEW  COMPARISON:  03/18/2013  FINDINGS: The bones are diffusely osteopenic. No acute fracture or dislocation is seen. No osseous erosion or dissecting soft tissue emphysema is identified. Soft tissue swelling about the ankle has decreased from the prior study. Diffuse vascular calcification is present.  IMPRESSION: Osteopenia.  No radiographic evidence active osteomyelitis.   Electronically Signed   By: Logan Bores   On: 10/09/2013 11:19    EKG: Independently reviewed. Poor quality. Sinus rhythm, RBBB (not new) and Q waves in inferior and lateral leads-probably secondary to lead placement. We'll repeat.   Assessment/Plan Principal Problem:   Nausea, vomiting and abdominal pain. Active Problems:   HYPOTHYROIDISM   DM   HYPERCHOLESTEROLEMIA   HYPERTENSION   CAD   COPD   CKD (chronic kidney disease) stage 3, GFR 30-59 ml/min   TIA (transient ischemic attack)   UTI (urinary tract infection)   Nausea and  vomiting   1. Nausea, vomiting and abdominal pain: DD-acute viral GE, UTI, PUD/gastritis, other etiology. Admit to medical floor from sedation. Start on clear liquid diets as tolerated. Supportive/symptomatic treatment with when necessary antiemetics, IV fluids, IV PPI and pain medications. Advance diet as tolerated and monitor. Treat UTI. 2. Enterococcus UTI: Allergic to penicillin. Has not started levofloxacin-start same. Does not look septic or toxic enough to treat with IV medications. 3. Uncontrolled hypertension: Resume home medications and monitor. 4. 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 Monitor. 5. Stage III chronic kidney disease: Creatinine looks improved compared to recent hospitalization. 6. Recent TIA: No residual deficits. Continue aspirin. 7. Dyslipidemia: Continue statins 8. Hypothyroid: Continue Synthroid 9. Chronic normocytic anemia: Hemoglobin is spuriously elevated secondary to mild dehydration. Hold Lasix for today. Brief IV fluid hydration and follow CBC in a.m.  10. History of CAD: Asymptomatic of chest pain. 11. History of COPD: Stable. Tobacco cessation counseled. 12. Tobacco abuse: Cessation counseled.     Code Status: Full  Family Communication: None at bedside  Disposition Plan: Return to SNF when medically stable   Time spent: 60 minutes  Modena Jansky, MD, FACP, [facility]. [facility] Pager [phone]  If 7PM-7AM, please contact night-coverage [email or web address] Password [facility] 10/11/2013, 8:32 AM

## 2013-10-11 NOTE — ED Notes (Signed)
Pt arrives by EMS from [facility].  Pt was just discharged from [facility] yesterday where she was treated for a UTI-to [facility] for rehab.  Per SNF-pt has vomited x 4 in the last 12 hours.

## 2013-10-11 NOTE — ED Notes (Signed)
Report called to floor [facility].

## 2013-10-11 NOTE — ED Provider Notes (Signed)
CSN: [id number]     Arrival date & time 10/11/13  4193 History   First MD Initiated Contact with Patient 10/11/13 [phone]     Chief Complaint  Patient presents with  . Emesis     (Consider location/radiation/quality/duration/timing/severity/associated sxs/prior Treatment) HPI Patient with recent admission for questionable TIA also found to have UTI. She was discharged yesterday to skilled nursing facility. She states that late afternoon she began having epigastric pain and she's had multiple episodes of vomiting. She's had one episode of loose stool. Denies blood in either the vomit or the stool. She denies any fevers or chills. Denies any chest pain or shortness of breath. Past Medical History  Diagnosis Date  . Hypertension   . Macular degeneration, bilateral   . Type II diabetes mellitus   . COPD (chronic obstructive pulmonary disease)   . Diabetic peripheral neuropathy   . Hypercholesteremia   . PAD (peripheral artery disease)   . Chronic kidney disease   . Headache(784.0)   . Hypothyroid   . Coronary artery disease    Past Surgical History  Procedure Laterality Date  . Tonsillectomy  1940's  . Abdominal hysterectomy  1970's  . Dilation and curettage of uterus  1970's    "probably 2" (11/01/2012)  . Cataract extraction w/ intraocular lens  implant, bilateral Bilateral ~ 2010  . Bypass graft Right ~ 1997    RLE by Dr. Gwenlyn Perking 11/29/2005 (11/01/2012)  . Cystostomy w/ bladder biopsy  2005    Archie Endo 09/19/2003 (11/01/2012)  . Cardiac catheterization  11/29/2005    Archie Endo 11/29/2005 (11/01/2012)  . Anterior cervical decomp/discectomy fusion  01/02/2006    Archie Endo 01/02/2006 (11/01/2012)  . Shoulder open rotator cuff repair Bilateral 1990's-2000's    "3X on the left; twice on the right" (11/01/2012)   No family history on file. History  Substance Use Topics  . Smoking status: Current Every Day Smoker -- 0.12 packs/day for 60 years    Types: Cigarettes  . Smokeless tobacco: Never  Used  . Alcohol Use: No   OB History   Grav Para Term Preterm Abortions TAB SAB Ect Mult Living                 Review of Systems  Constitutional: Negative for fever and chills.  Respiratory: Negative for shortness of breath.   Cardiovascular: Negative for chest pain.  Gastrointestinal: Positive for nausea, vomiting, abdominal pain and diarrhea. Negative for blood in stool.  Genitourinary: Negative for dysuria.  Musculoskeletal: Negative for back pain, myalgias, neck pain and neck stiffness.  Skin: Negative for rash and wound.  Neurological: Negative for dizziness, weakness, light-headedness, numbness and headaches.  All other systems reviewed and are negative.     Allergies  Codeine; Lisinopril; Penicillins; Septra; and Sulfur  Home Medications   Prior to Admission medications   Medication Sig Start Date End Date Taking? Authorizing Provider  acetaminophen (TYLENOL) 500 MG tablet Take 500 mg by mouth every 4 (four) hours as needed for mild pain, fever or headache.    Historical Provider, MD  albuterol (PROVENTIL) (2.5 MG/3ML) 0.083% nebulizer solution Take 2.5 mg by nebulization every 6 (six) hours as needed for wheezing.    Historical Provider, MD  ALPRAZolam Duanne Moron) 0.25 MG tablet Take 1 tablet (0.25 mg total) by mouth 2 (two) times daily as needed for anxiety. 10/10/13   Kinnie Feil, MD  amitriptyline (ELAVIL) 50 MG tablet Take 50 mg by mouth at bedtime.    Historical Provider, MD  amLODipine (  NORVASC) 10 MG tablet Take 10 mg by mouth every morning.     Historical Provider, MD  aspirin 325 MG tablet Take 1 tablet (325 mg total) by mouth daily. 10/10/13   Kinnie Feil, MD  budesonide-formoterol (SYMBICORT) 160-4.5 MCG/ACT inhaler Inhale 2 puffs into the lungs 2 (two) times daily.    Historical Provider, MD  carvedilol (COREG) 25 MG tablet Take 25 mg by mouth 2 (two) times daily with a meal. 0800 and 1700    Historical Provider, MD  cloNIDine (CATAPRES) 0.2 MG tablet  Take 0.2 mg by mouth 2 (two) times daily. Hold if blood pressure less than 100/60    Historical Provider, MD  furosemide (LASIX) 40 MG tablet Take 40 mg by mouth daily.    Historical Provider, MD  gabapentin (NEURONTIN) 100 MG capsule Take 100-300 mg by mouth 3 (three) times daily. 1 capsule at 0800, 1 capsule at 1400, and 3 capsules at 2000.    Historical Provider, MD  guaiFENesin (ROBITUSSIN) 100 MG/5ML SOLN Take 10 mLs by mouth every 4 (four) hours as needed for cough or to loosen phlegm.    Historical Provider, MD  HYDROcodone-acetaminophen (NORCO/VICODIN) 5-325 MG per tablet Take 1 tablet by mouth every 8 (eight) hours as needed for moderate pain or severe pain. 10/10/13   Kinnie Feil, MD  insulin glargine (LANTUS) 100 UNIT/ML injection Inject 0.4 mLs (40 Units total) into the skin at bedtime. 10/10/13   Kinnie Feil, MD  insulin lispro (HUMALOG) 100 UNIT/ML injection Inject 0-14 Units into the skin 3 (three) times daily before meals. CBG greater than 350=14 units    Historical Provider, MD  levofloxacin (LEVAQUIN) 500 MG tablet Take 1 tablet (500 mg total) by mouth daily. 10/10/13   Kinnie Feil, MD  levothyroxine (SYNTHROID, LEVOTHROID) 50 MCG tablet Take 50 mcg by mouth every morning.     Historical Provider, MD  Multiple Vitamins-Minerals (HEALTHY EYES) TABS Take 1 tablet by mouth every morning.     Historical Provider, MD  polyethylene glycol (MIRALAX / GLYCOLAX) packet Take 51 g by mouth daily as needed for mild constipation, moderate constipation or severe constipation.    Historical Provider, MD  rosuvastatin (CRESTOR) 20 MG tablet Take 20 mg by mouth every evening.     Historical Provider, MD  senna-docusate (SENOKOT-S) 8.6-50 MG per tablet Take 2 tablets by mouth at bedtime.     Historical Provider, MD  tiotropium (SPIRIVA) 18 MCG inhalation capsule Place 18 mcg into inhaler and inhale daily.    Historical Provider, MD   BP 175/74  Pulse 101  Temp(Src) 98.8 F (37.1 C)  (Oral)  Resp 20  SpO2 95% Physical Exam  Nursing note and vitals reviewed. Constitutional: She is oriented to person, place, and time. She appears well-developed and well-nourished. No distress.  HENT:  Head: Normocephalic and atraumatic.  Mouth/Throat: Oropharynx is clear and moist.  Eyes: EOM are normal. Pupils are equal, round, and reactive to light.  Neck: Normal range of motion. Neck supple.  Cardiovascular: Normal rate and regular rhythm.   Pulmonary/Chest: Effort normal and breath sounds normal. No respiratory distress. She has no wheezes. She has no rales.  Abdominal: Soft. Bowel sounds are normal. She exhibits no distension and no mass. There is tenderness (tenderness to palpation in the epigastric region. Patient with mild right upper quadrant and right lower quadrant tenderness to palpation. There is no rebound or guarding.). There is no rebound and no guarding.  Musculoskeletal: Normal range of  motion. She exhibits no edema and no tenderness.  No CVA tenderness.  Neurological: She is alert and oriented to person, place, and time.  Appears to move all extremities without deficit. Sensation is grossly intact.  Skin: Skin is warm and dry. No rash noted. No erythema.  Psychiatric: She has a normal mood and affect. Her behavior is normal.    ED Course  Procedures (including critical care time) Labs Review Labs Reviewed  CBC WITH DIFFERENTIAL - Abnormal; Notable for the following:    WBC 14.6 (*)    Neutro Abs 11.1 (*)    Monocytes Absolute 1.1 (*)    All other components within normal limits  COMPREHENSIVE METABOLIC PANEL  URINALYSIS, ROUTINE W REFLEX MICROSCOPIC  LIPASE, BLOOD  TROPONIN I    Imaging Review Dg Foot 2 Views Left  10/09/2013   CLINICAL DATA:  Lateral left heel wound that has not healed for 8 months. Diabetes.  EXAM: LEFT FOOT - 2 VIEW  COMPARISON:  03/18/2013  FINDINGS: The bones are diffusely osteopenic. No acute fracture or dislocation is seen. No  osseous erosion or dissecting soft tissue emphysema is identified. Soft tissue swelling about the ankle has decreased from the prior study. Diffuse vascular calcification is present.  IMPRESSION: Osteopenia.  No radiographic evidence active osteomyelitis.   Electronically Signed   By: Logan Bores   On: 10/09/2013 11:19     EKG Interpretation None      MDM   Final diagnoses:  None    Discussed with hospitalist and we'll admit for outpatient treatment of UTI with persistent vomiting.    Julianne Rice, MD 10/12/13 [phone]

## 2013-10-11 NOTE — ED Notes (Signed)
Attempted to call report.  Nurse with MD, will call back later.

## 2013-10-11 NOTE — ED Notes (Signed)
Bed: YK59 Expected date:  Expected time:  Means of arrival:  Comments: EMS/78 yo female from [facility] with nausea and vomiting

## 2013-10-11 NOTE — Evaluation (Signed)
Physical Therapy Evaluation Patient Details Name: [name] MRN: [id number] DOB: 1935/01/25 Today's Date: 10/11/2013   History of Present Illness  Pt just DC'd from [facility] 5/28 to SNF, brought to [facility] for N/V. Pt has been long term resident of snf, non ambulatory. mobilized in [place].  Clinical Impression  Pt  Tolerated pivot to [facility] and back. Pt incontinent of urine. Pt will benefit from PT to address problems listed. Plans for SNF rehab.    Follow Up Recommendations SNF;Supervision/Assistance - 24 hour    Equipment Recommendations  None recommended by PT    Recommendations for Other Services       Precautions / Restrictions Precautions Precautions: Fall Restrictions Weight Bearing Restrictions: No      Mobility  Bed Mobility   Bed Mobility: Supine to Sit Rolling: Min guard   Supine to sit: HOB elevated;Min guard Sit to supine: Min guard   General bed mobility comments: extra time to mobilize to edge of bed, pt able to move legs and sit up, then got legs back into bed.  Transfers Overall transfer level: Needs assistance Equipment used:  (1 person bear hug.) Transfers: Set designer Transfers;Sit to/from Stand Sit to Stand: Mod assist         General transfer comment: BSC set up , pt stood up holding onto BSC arms, slowly turned but did not completely turn, assisted to square sef on BSC. Then squat pivoted to bed and did not completely turn but enough to sit  onto bed, Pt was able to self scoot up along the bed toward the head.  Ambulation/Gait                Stairs            Wheelchair Mobility    Modified Rankin (Stroke Patients Only)       Balance Overall balance assessment: Needs assistance Sitting-balance support: Bilateral upper extremity supported;Feet supported Sitting balance-Leahy Scale: Poor                                       Pertinent Vitals/Pain No c/o    Home Living Family/patient expects to be discharged  to:: Skilled nursing facility                      Prior Function Level of Independence: Needs assistance   Gait / Transfers Assistance Needed: pt reports uses WC to get to BR, has sores on heels due to uses feet to propel WC           Hand Dominance        Extremity/Trunk Assessment   Upper Extremity Assessment: Overall WFL for tasks assessed           Lower Extremity Assessment: RLE deficits/detail;LLE deficits/detail RLE Deficits / Details: grossly   able to stand and pivot, unable to stand  longer than feww seconds. Pt has pressure sore on lateral heel, Legs position in ER in supine LLE Deficits / Details: same as R  Cervical / Trunk Assessment: Normal  Communication      Cognition Arousal/Alertness: Awake/alert Behavior During Therapy: WFL for tasks assessed/performed Overall Cognitive Status: No family/caregiver present to determine baseline cognitive functioning Area of Impairment: Problem solving;Orientation Orientation Level: Place           Problem Solving: Slow processing;Decreased initiation;Difficulty sequencing;Requires verbal cues;Requires tactile cues      General  Comments      Exercises        Assessment/Plan    PT Assessment Patient needs continued PT services  PT Diagnosis Generalized weakness   PT Problem List Decreased strength;Decreased activity tolerance;Decreased balance;Decreased mobility  PT Treatment Interventions DME instruction;Gait training;Functional mobility training;Therapeutic activities;Therapeutic exercise;Balance training   PT Goals (Current goals can be found in the Care Plan section) Acute Rehab PT Goals Patient Stated Goal: I don't want to to go to [facility] PT Goal Formulation: With patient Time For Goal Achievement: 10/25/13 Potential to Achieve Goals: Good    Frequency Min 2X/week   Barriers to discharge        Co-evaluation               End of Session Equipment Utilized During  Treatment: Gait belt Activity Tolerance: Patient tolerated treatment well Patient left: in bed;with call bell/phone within reach;with bed alarm set Nurse Communication: Mobility status (needs Allevyn on sacrum.)    Functional Assessment Tool Used: clinical judgement. Functional Limitation: Mobility: Walking and moving around Mobility: Walking and Moving Around Current Status [phone]): At least 40 percent but less than 60 percent impaired, limited or restricted Mobility: Walking and Moving Around Goal Status [phone]): At least 1 percent but less than 20 percent impaired, limited or restricted    Time: 1030-1056 PT Time Calculation (min): 26 min   Charges:   PT Evaluation $Initial PT Evaluation Tier I: 1 Procedure PT Treatments $Therapeutic Activity: 23-37 mins   PT G Codes:   Functional Assessment Tool Used: clinical judgement. Functional Limitation: Mobility: Walking and moving around    [facility] 10/11/2013, 11:42 AM Tresa Endo PT [phone]

## 2013-10-11 NOTE — Progress Notes (Signed)
Clinical Social Work Department BRIEF PSYCHOSOCIAL ASSESSMENT 10/11/2013  Patient:  [name], [name]     Account Number:  [id number]     [place] date:  10/11/2013  Clinical Social Worker:  Earlie Server  Date/Time:  10/11/2013 03:00 PM  Referred by:  Physician  Date Referred:  10/11/2013 Referred for  Psychosocial assessment   Other Referral:   Interview type:  Patient Other interview type:    PSYCHOSOCIAL DATA Living Status:  FACILITY Admitted from facility:  [facility] Level of care:  [facility] Primary support name:  Jenny Reichmann Primary support relationship to patient:  CHILD, ADULT Degree of support available:   Strong    CURRENT CONCERNS Current Concerns  Post-Acute Placement   Other Concerns:    SOCIAL WORK ASSESSMENT / PLAN CSW received referral in order to complete psychosocial assessment due to patient being admitted from a facility. CSW reviewed chart and met with patient at bedside. CSW introduced myself and explained role.    Patient reports that she has been living at [facility] (ALF) but reports that she had to go to [facility] (SNF) for rehab. Patient was only at SNF for 1 day prior to coming back to the hospital. Patient reports she has worked with PT and agreeable to return to SNF to complete rehab prior to returning to ALF. CSW explained process and CSW role.    CSW completed FL2 and faxed information to [facility] and left a message with admissions re: patient returning at DC.   Assessment/plan status:  Psychosocial Support/Ongoing Assessment of Needs Other assessment/ plan:   Information/referral to community resources:   Will return to SNF    PATIENT'S/FAMILY'S RESPONSE TO PLAN OF CARE: Patient alert and oriented. Patient reports she is not feeling well and hopeful to get better soon so that she can complete rehab. Patient reports good family involvement and they are aware of hospitalization. Patient thanked CSW  for visit but reports she just wants to rest at this time.       [place], [employer] [phone]

## 2013-10-12 ENCOUNTER — Observation Stay (HOSPITAL_COMMUNITY): Payer: Medicare Other

## 2013-10-12 DIAGNOSIS — R109 Unspecified abdominal pain: Secondary | ICD-10-CM

## 2013-10-12 DIAGNOSIS — K259 Gastric ulcer, unspecified as acute or chronic, without hemorrhage or perforation: Secondary | ICD-10-CM | POA: Diagnosis not present

## 2013-10-12 LAB — BASIC METABOLIC PANEL
BUN: 25 mg/dL — ABNORMAL HIGH (ref 6–23)
CHLORIDE: 109 meq/L (ref 96–112)
CO2: 26 mEq/L (ref 19–32)
Calcium: 8.8 mg/dL (ref 8.4–10.5)
Creatinine, Ser: 1.54 mg/dL — ABNORMAL HIGH (ref 0.50–1.10)
GFR calc non Af Amer: 32 mL/min — ABNORMAL LOW (ref >90)
GFR, EST AFRICAN AMERICAN: 37 mL/min — AB (ref >90)
Glucose, Bld: 106 mg/dL — ABNORMAL HIGH (ref 70–99)
POTASSIUM: 4.7 meq/L (ref 3.7–5.3)
Sodium: 143 mEq/L (ref 137–147)

## 2013-10-12 LAB — CBC
HCT: 29.7 % — ABNORMAL LOW (ref 36.0–46.0)
Hemoglobin: 9.2 g/dL — ABNORMAL LOW (ref 12.0–15.0)
MCH: 28.7 pg (ref 26.0–34.0)
MCHC: 31 g/dL (ref 30.0–36.0)
MCV: 92.5 fL (ref 78.0–100.0)
Platelets: 188 10*3/uL (ref 150–400)
RBC: 3.21 MIL/uL — ABNORMAL LOW (ref 3.87–5.11)
RDW: 13.3 % (ref 11.5–15.5)
WBC: 8.8 10*3/uL (ref 4.0–10.5)

## 2013-10-12 LAB — GLUCOSE, CAPILLARY
GLUCOSE-CAPILLARY: 145 mg/dL — AB (ref 70–99)
GLUCOSE-CAPILLARY: 175 mg/dL — AB (ref 70–99)
GLUCOSE-CAPILLARY: 135 mg/dL — AB (ref 70–99)
Glucose-Capillary: 103 mg/dL — ABNORMAL HIGH (ref 70–99)
Glucose-Capillary: 129 mg/dL — ABNORMAL HIGH (ref 70–99)

## 2013-10-12 MED ORDER — IOHEXOL 300 MG/ML  SOLN
50.0000 mL | Freq: Once | INTRAMUSCULAR | Status: AC | PRN
  Administered 2013-10-12: 50 mL via ORAL

## 2013-10-12 MED ORDER — BIOTENE DRY MOUTH MT LIQD
15.0000 mL | Freq: Two times a day (BID) | OROMUCOSAL | Status: DC
  Administered 2013-10-12 – 2013-10-16 (×5): 15 mL via OROMUCOSAL

## 2013-10-12 MED ORDER — LEVOFLOXACIN 750 MG PO TABS
750.0000 mg | ORAL_TABLET | ORAL | Status: DC
  Administered 2013-10-14 – 2013-10-16 (×2): 750 mg via ORAL
  Filled 2013-10-12 (×2): qty 1

## 2013-10-12 MED ORDER — SODIUM CHLORIDE 0.9 % IV SOLN: Freq: Once | INTRAVENOUS | Status: DC

## 2013-10-12 MED ORDER — GI COCKTAIL ~~LOC~~
30.0000 mL | Freq: Once | ORAL | Status: DC
  Filled 2013-10-12: qty 30

## 2013-10-12 NOTE — Progress Notes (Addendum)
Pt finally woke up and tried taking the GI cocktail. She took 1 sip, said it burned on the way down, and refused the rest. Patient reported that everything she drinks burns on the way down and that it has been doing so for over a month. She also reports that the right side of her stomach has been hurting for one month now. Dr. Karleen Hampshire notified.

## 2013-10-12 NOTE — Progress Notes (Signed)
[facility] PROGRESS NOTE  [name] NAT:[id number] DOB: 1934/12/25 DOA: 10/11/2013 PCP: No primary provider on file.  Assessment/Plan: 1. Nausea, vomiting and abdominal pain: DD-acute viral GE, UTI, PUD/gastritis, other etiology. Admitted to medical floor . Start on clear liquid diets as tolerated. Supportive/symptomatic treatment with when necessary antiemetics, IV fluids, IV PPI and pain medications. Advance diet as tolerated and monitor. She reports no relief with IV ppi and gi cocktail. Will plan for CT abdomen and pelvis and call GI in am.  2. Enterococcus UTI: Allergic to penicillin. Has not started levofloxacin-start same. Does not look septic or toxic enough to treat with IV medications. 3. Uncontrolled hypertension: Resume home medications and monitor. 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 Monitor. CBG (last 3)   Recent Labs  10/12/13 0721 10/12/13 1127 10/12/13 1702  GLUCAP 145* 129* 175*   4. Stage III chronic kidney disease: Creatinine looks improved compared to recent hospitalization. 5. Recent TIA: No residual deficits. Continue aspirin. 6. Dyslipidemia: Continue statins 7. Hypothyroid: Continue Synthroid 8. Chronic normocytic anemia: at baseline  9. History of CAD: Asymptomatic of chest pain. 10. History of COPD: Stable. Tobacco cessation counseled. 11. Tobacco abuse: Cessation counseled. Code Status: Full  Family Communication: None at bedside  Disposition Plan: Return to SNF when medically stable     Consultants:  none  Procedures: CT abdomen and pelvis.   Antibiotics:  levaquin  HPI/Subjective: Reports burning sensation in the stomach on eating. No diarrhea, no vomiting.   Objective: Filed Vitals:   10/12/13 0601  BP: 116/63  Pulse: 48  Temp: 98.4 F (36.9 C)  Resp: 18    Intake/Output Summary (Last 24 hours) at 10/12/13 1107 Last data filed at 10/12/13 0126  Gross per 24 hour  Intake   1996 ml  Output      0 ml  Net   1996 ml   Filed Weights   10/11/13 1147  Weight: 81.1 kg (178 lb 12.7 oz)    Exam:   General:  Sleeping comfortably  Cardiovascular: s1s2  Respiratory: ctab  Abdomen: soft NT ND BS+  Musculoskeletal: NO pedal edema.   Data Reviewed: Basic Metabolic Panel:  Recent Labs Lab 10/07/13 1231 10/07/13 1303 10/09/13 0428 10/11/13 0400 10/12/13 0532  NA 136* 139 143 143 143  K 4.9 4.8 4.3 4.9 4.7  CL 100 102 109 105 109  CO2 22  --  23 24 26   GLUCOSE 380* 379* 129* 260* 106*  BUN 44* 42* 34* 20 25*  CREATININE 1.81* 1.90* 1.38* 1.14* 1.54*  CALCIUM 9.2  --  8.9 9.9 8.8   Liver Function Tests:  Recent Labs Lab 10/07/13 1231 10/11/13 0400  AST 10 13  ALT 8 10  ALKPHOS 61 73  BILITOT 0.3 0.2*  PROT 6.4 7.2  ALBUMIN 2.8* 3.1*    Recent Labs Lab 10/11/13 0400  LIPASE 15   No results found for this basename: AMMONIA,  in the last 168 hours CBC:  Recent Labs Lab 10/07/13 1231 10/07/13 1303 10/09/13 0428 10/10/13 0542 10/11/13 0400 10/12/13 0532  WBC 14.2*  --  7.6 11.4* 14.6* 8.8  NEUTROABS 9.3*  --   --   --  11.1*  --   HGB 10.2* 11.6* 9.2* 10.9* 12.6 9.2*  HCT 32.1* 34.0* 29.0* 33.9* 39.5 29.7*  MCV 90.9  --  92.1 90.6 90.4 92.5  PLT 188  --  183 233 246 188  Cardiac Enzymes:  Recent Labs Lab 10/11/13 0400  TROPONINI <0.30   BNP (last 3 results) No results found for this basename: PROBNP,  in the last 8760 hours CBG:  Recent Labs Lab 10/10/13 1119 10/11/13 1325 10/11/13 1650 10/11/13 2122 10/12/13 0721  GLUCAP 158* 253* 232* 103* 145*    No results found for this or any previous visit (from the past 240 hour(s)).   Studies: US Abdomen Complete  10/11/2013   CLINICAL DATA:  Epigastric abdominal pain.  EXAM: ULTRASOUND ABDOMEN COMPLETE  COMPARISON:  Renal ultrasound performed 06/21/2013  FINDINGS: Gallbladder:  A few tiny stones are seen dependently within the  gallbladder; the gallbladder is otherwise unremarkable in appearance. No gallbladder wall thickening or pericholecystic fluid is seen. No ultrasonographic Murphy's sign is elicited.  Common bile duct:  Diameter: 0.6 cm, within normal limits in caliber.  Liver:  No focal lesion identified. Within normal limits in parenchymal echogenicity.  IVC:  No abnormality visualized.  Pancreas:  There is question of areas of mildly decreased pancreatic echogenicity, which could reflect mild edema.  Spleen:  Size and appearance within normal limits.  Right Kidney:  Length: 10.0 cm. Echogenicity within normal limits. No mass or hydronephrosis visualized.  Left Kidney:  Length: 9.6 cm. Echogenicity within normal limits. No mass or hydronephrosis visualized.  Abdominal aorta:  No aneurysm visualized.  Other findings:  None.  IMPRESSION: 1. Question of areas of mildly decreased pancreatic echogenicity, which could reflect mild edema. Would correlate with lipase. 2. Cholelithiasis; gallbladder otherwise unremarkable in appearance.   Electronically Signed   By: Garald Balding M.D.   On: 10/11/2013 06:10    Scheduled Meds: . amitriptyline  50 mg Oral QHS  . amLODipine  10 mg Oral q morning - 10a  . aspirin  325 mg Oral Daily  . atorvastatin  40 mg Oral q1800  . budesonide-formoterol  2 puff Inhalation BID  . carvedilol  25 mg Oral BID WC  . cloNIDine  0.2 mg Oral BID  . enoxaparin (LOVENOX) injection  40 mg Subcutaneous Daily  . furosemide  40 mg Oral Daily  . gabapentin  100 mg Oral BID   And  . gabapentin  300 mg Oral QHS  . insulin aspart  0-5 Units Subcutaneous QHS  . insulin aspart  0-9 Units Subcutaneous TID WC  . insulin glargine  25 Units Subcutaneous Daily  . levofloxacin  500 mg Oral Daily  . levothyroxine  50 mcg Oral q morning - 10a  . pantoprazole (PROTONIX) IV  40 mg Intravenous Q24H  . senna-docusate  2 tablet Oral QHS  . tiotropium  18 mcg Inhalation Daily   Continuous Infusions:   Principal  Problem:   Nausea, vomiting and abdominal pain. Active Problems:   HYPOTHYROIDISM   DM   HYPERCHOLESTEROLEMIA   HYPERTENSION   CAD   COPD   CKD (chronic kidney disease) stage 3, GFR 30-59 ml/min   TIA (transient ischemic attack)   UTI (urinary tract infection)   Nausea and vomiting    Time spent: 1min    Hosie Poisson  [facility] Pager [phone]  If 7PM-7AM, please contact night-coverage at [email or web address], password [facility] 10/12/2013, 11:07 AM  LOS: 1 day

## 2013-10-13 DIAGNOSIS — K259 Gastric ulcer, unspecified as acute or chronic, without hemorrhage or perforation: Secondary | ICD-10-CM | POA: Diagnosis not present

## 2013-10-13 DIAGNOSIS — E43 Unspecified severe protein-calorie malnutrition: Secondary | ICD-10-CM | POA: Insufficient documentation

## 2013-10-13 LAB — BASIC METABOLIC PANEL
BUN: 24 mg/dL — ABNORMAL HIGH (ref 6–23)
CHLORIDE: 108 meq/L (ref 96–112)
CO2: 24 meq/L (ref 19–32)
Calcium: 9.2 mg/dL (ref 8.4–10.5)
Creatinine, Ser: 1.59 mg/dL — ABNORMAL HIGH (ref 0.50–1.10)
GFR calc Af Amer: 35 mL/min — ABNORMAL LOW (ref >90)
GFR calc non Af Amer: 30 mL/min — ABNORMAL LOW (ref >90)
Glucose, Bld: 103 mg/dL — ABNORMAL HIGH (ref 70–99)
Potassium: 4.1 mEq/L (ref 3.7–5.3)
SODIUM: 143 meq/L (ref 137–147)

## 2013-10-13 LAB — CBC
HCT: 31 % — ABNORMAL LOW (ref 36.0–46.0)
HEMOGLOBIN: 10.1 g/dL — AB (ref 12.0–15.0)
MCH: 29.5 pg (ref 26.0–34.0)
MCHC: 32.6 g/dL (ref 30.0–36.0)
MCV: 90.6 fL (ref 78.0–100.0)
Platelets: 203 10*3/uL (ref 150–400)
RBC: 3.42 MIL/uL — AB (ref 3.87–5.11)
RDW: 13.1 % (ref 11.5–15.5)
WBC: 8 10*3/uL (ref 4.0–10.5)

## 2013-10-13 LAB — GLUCOSE, CAPILLARY
GLUCOSE-CAPILLARY: 151 mg/dL — AB (ref 70–99)
GLUCOSE-CAPILLARY: 164 mg/dL — AB (ref 70–99)
Glucose-Capillary: 102 mg/dL — ABNORMAL HIGH (ref 70–99)
Glucose-Capillary: 203 mg/dL — ABNORMAL HIGH (ref 70–99)

## 2013-10-13 MED ORDER — SODIUM CHLORIDE 0.9 % IV SOLN
INTRAVENOUS | Status: DC
  Administered 2013-10-13: 20:00:00 via INTRAVENOUS
  Administered 2013-10-13: 300 mL via INTRAVENOUS

## 2013-10-13 MED ORDER — NYSTATIN 100000 UNIT/ML MT SUSP
5.0000 mL | Freq: Four times a day (QID) | OROMUCOSAL | Status: DC
  Administered 2013-10-13 – 2013-10-14 (×3): 500000 [IU] via ORAL
  Filled 2013-10-13 (×6): qty 5

## 2013-10-13 MED ORDER — BOOST PLUS PO LIQD
237.0000 mL | Freq: Three times a day (TID) | ORAL | Status: DC
  Administered 2013-10-14 – 2013-10-16 (×5): 237 mL via ORAL
  Filled 2013-10-13 (×10): qty 237

## 2013-10-13 NOTE — Progress Notes (Signed)
INITIAL NUTRITION ASSESSMENT  DOCUMENTATION CODES Per approved criteria  -Severe malnutrition in the context of acute illness or injury   INTERVENTION:  .Boost tid  RD to follow.  NUTRITION DIAGNOSIS: Inadequate oral intake related to altered gi function as evidenced by patient reports of severe burning with all po for the past week.   Goal: Tolerate diet/supplements with intake to meet >75% estimated needs.  Monitor:  Intake, labs, weight trend.  Reason for Assessment: consult for assessment of status and needs  79 y.o. female  Admitting Dx: Nausea & vomiting  ASSESSMENT: Patient admitted from [facility] with a week long hx of epigastric abdominal pain associated with nausea and vomiting and reports burning with all po for the past week.   A1C was 9.9 5/26 and 6.9 2/5.  Acute gastritis.  Intake is very poor.  Reports UBW 185 lbs 1 week ago.  Ate well prior to that. Likes Boost.  Wound on left foot noted.  Patient meets criteria for severe malnutrtion in the context of acute illness AEB weight loss of >3% in the past week, intake <50% in the past week, and decrease of body fat and muscle mass on exam.  Nutrition Focused Physical Exam:  Subcutaneous Fat:  Orbital Region: mild/moderate Upper Arm Region: wnl Thoracic and Lumbar Region: n/a  Muscle:  Temple Region: severe Clavicle Bone Region: wnl Clavicle and Acromion Bone Region: mild Scapular Bone Region: mild Dorsal Hand: wnl Patellar Region: wnl Anterior Thigh Region: moderate Posterior Calf Region: wnl  Edema: not noted    Height: Ht Readings from Last 1 Encounters:  10/11/13 5\' 3"  (1.6 m)    Weight: Wt Readings from Last 1 Encounters:  10/11/13 178 lb 12.7 oz (81.1 kg)    Ideal Body Weight: 115 lbs  % Ideal Body Weight: 155  Wt Readings from Last 10 Encounters:  10/11/13 178 lb 12.7 oz (81.1 kg)  10/08/13 188 lb 15 oz (85.7 kg)  06/23/13 202 lb 2.6 oz (91.7 kg)  11/02/12 190 lb 1.6 oz  (86.229 kg)  01/24/12 185 lb 3.2 oz (84.006 kg)    Usual Body Weight: 185 lbs last week per patient.  % Usual Body Weight: 96.2  BMI:  Body mass index is 31.68 kg/(m^2).  Estimated Nutritional Needs: Kcal: 1500-1600 Protein: 90-1000 Fluid: 1.6L  Skin: wound on left foot  Diet Order: Criss Rosales  EDUCATION NEEDS: -Education needs addressed   Intake/Output Summary (Last 24 hours) at 10/13/13 1355 Last data filed at 10/13/13 1100  Gross per 24 hour  Intake   1020 ml  Output      0 ml  Net   1020 ml    Labs:   Recent Labs Lab 10/11/13 0400 10/12/13 0532 10/13/13 1030  NA 143 143 143  K 4.9 4.7 4.1  CL 105 109 108  CO2 24 26 24   BUN 20 25* 24*  CREATININE 1.14* 1.54* 1.59*  CALCIUM 9.9 8.8 9.2  GLUCOSE 260* 106* 103*    CBG (last 3)   Recent Labs  10/12/13 1702 10/12/13 2131 10/13/13 0717  GLUCAP 175* 135* 102*    Scheduled Meds: . sodium chloride   Intravenous Once  . amitriptyline  50 mg Oral QHS  . amLODipine  10 mg Oral q morning - 10a  . antiseptic oral rinse  15 mL Mouth Rinse BID  . aspirin  325 mg Oral Daily  . atorvastatin  40 mg Oral q1800  . budesonide-formoterol  2 puff Inhalation BID  . carvedilol  25 mg Oral BID WC  . cloNIDine  0.2 mg Oral BID  . enoxaparin (LOVENOX) injection  40 mg Subcutaneous Daily  . gabapentin  100 mg Oral BID   And  . gabapentin  300 mg Oral QHS  . gi cocktail  30 mL Oral Once  . insulin aspart  0-5 Units Subcutaneous QHS  . insulin aspart  0-9 Units Subcutaneous TID WC  . insulin glargine  25 Units Subcutaneous Daily  . [START ON 10/14/2013] levofloxacin  750 mg Oral Q48H  . levothyroxine  50 mcg Oral q morning - 10a  . pantoprazole (PROTONIX) IV  40 mg Intravenous Q24H  . senna-docusate  2 tablet Oral QHS  . tiotropium  18 mcg Inhalation Daily    Continuous Infusions:   Past Medical History  Diagnosis Date  . Hypertension   . Macular degeneration, bilateral   . Type II diabetes mellitus   . COPD  (chronic obstructive pulmonary disease)   . Diabetic peripheral neuropathy   . Hypercholesteremia   . PAD (peripheral artery disease)   . Chronic kidney disease   . Headache(784.0)   . Hypothyroid   . Coronary artery disease     Past Surgical History  Procedure Laterality Date  . Tonsillectomy  1940's  . Abdominal hysterectomy  1970's  . Dilation and curettage of uterus  1970's    "probably 2" (11/01/2012)  . Cataract extraction w/ intraocular lens  implant, bilateral Bilateral ~ 2010  . Bypass graft Right ~ 1997    RLE by Dr. Gwenlyn Perking 11/29/2005 (11/01/2012)  . Cystostomy w/ bladder biopsy  2005    Archie Endo 09/19/2003 (11/01/2012)  . Cardiac catheterization  11/29/2005    Archie Endo 11/29/2005 (11/01/2012)  . Anterior cervical decomp/discectomy fusion  01/02/2006    Archie Endo 01/02/2006 (11/01/2012)  . Shoulder open rotator cuff repair Bilateral 1990's-2000's    "3X on the left; twice on the right" (11/01/2012)    Antonieta Iba, RD, LDN Clinical Inpatient Dietitian Pager:  [phone] Weekend and after hours pager:  [phone]

## 2013-10-13 NOTE — Progress Notes (Signed)
[facility] PROGRESS NOTE  MARKIYA KEEFE JIR:[id number] DOB: 1934-08-01 DOA: 10/11/2013 PCP: No primary provider on file.  Assessment/Plan: 1. Nausea, vomiting and odynophagia: DD-possibly secondary to antibiotic use fungal esophagitis. Admitted to medical floor . Start on clear liquid diets as tolerated. Supportive/symptomatic treatment with when necessary antiemetics, IV fluids, IV PPI and pain medications. Advance diet as tolerated and monitor. She reports no relief with IV ppi and gi cocktail.  CT ABDO shows gall stones. Will call surgery for evaluate for cholecystectomy.  Gi consulted for possible EGD.  2. Enterococcus UTI: Allergic to penicillin. Has not started levofloxacin-start same. Does not look septic or toxic enough to treat with IV medications. 3. Uncontrolled hypertension: Resume home medications and monitor. 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 Monitor. CBG (last 3)   Recent Labs  10/13/13 0717 10/13/13 1125 10/13/13 1655  GLUCAP 102* 151* 203*   4. Stage III chronic kidney disease: Creatinine looks improved compared to recent hospitalization. 5. Recent TIA: No residual deficits. Continue aspirin. 6. Dyslipidemia: Continue statins 7. Hypothyroid: Continue Synthroid 8. Chronic normocytic anemia: at baseline  9. History of CAD: Asymptomatic of chest pain. 10. History of COPD: Stable. Tobacco cessation counseled. 11. Tobacco abuse: Cessation counseled. Code Status: Full  Family Communication: FAMILY at bedside  Disposition Plan: Return to SNF when medically stable     Consultants:  GI  Procedures: CT abdomen and pelvis.   Antibiotics:  levaquin  HPI/Subjective: Reports burning sensation in the stomach on eating. No diarrhea, no vomiting.   Objective: Filed Vitals:   10/13/13 1300  BP: 147/63  Pulse: 51  Temp: 98.1 F (36.7 C)  Resp: 18    Intake/Output Summary (Last 24  hours) at 10/13/13 1703 Last data filed at 10/13/13 1300  Gross per 24 hour  Intake   1140 ml  Output      0 ml  Net   1140 ml   Filed Weights   10/11/13 1147  Weight: 81.1 kg (178 lb 12.7 oz)    Exam:   General:  Sleeping comfortably  Cardiovascular: s1s2  Respiratory: ctab  Abdomen: soft NT ND BS+  Musculoskeletal: NO pedal edema.   Data Reviewed: Basic Metabolic Panel:  Recent Labs Lab 10/07/13 1231 10/07/13 1303 10/09/13 0428 10/11/13 0400 10/12/13 0532 10/13/13 1030  NA 136* 139 143 143 143 143  K 4.9 4.8 4.3 4.9 4.7 4.1  CL 100 102 109 105 109 108  CO2 22  --  23 24 26 24   GLUCOSE 380* 379* 129* 260* 106* 103*  BUN 44* 42* 34* 20 25* 24*  CREATININE 1.81* 1.90* 1.38* 1.14* 1.54* 1.59*  CALCIUM 9.2  --  8.9 9.9 8.8 9.2   Liver Function Tests:  Recent Labs Lab 10/07/13 1231 10/11/13 0400  AST 10 13  ALT 8 10  ALKPHOS 61 73  BILITOT 0.3 0.2*  PROT 6.4 7.2  ALBUMIN 2.8* 3.1*    Recent Labs Lab 10/11/13 0400  LIPASE 15   No results found for this basename: AMMONIA,  in the last 168 hours CBC:  Recent Labs Lab 10/07/13 1231  10/09/13 0428 10/10/13 0542 10/11/13 0400 10/12/13 0532 10/13/13 1030  WBC 14.2*  --  7.6 11.4* 14.6* 8.8 8.0  NEUTROABS 9.3*  --   --   --  11.1*  --   --   HGB 10.2*  < > 9.2* 10.9* 12.6 9.2* 10.1*  HCT 32.1*  < >  29.0* 33.9* 39.5 29.7* 31.0*  MCV 90.9  --  92.1 90.6 90.4 92.5 90.6  PLT 188  --  183 233 246 188 203  < > = values in this interval not displayed. Cardiac Enzymes:  Recent Labs Lab 10/11/13 0400  TROPONINI <0.30   BNP (last 3 results) No results found for this basename: PROBNP,  in the last 8760 hours CBG:  Recent Labs Lab 10/12/13 1702 10/12/13 2131 10/13/13 0717 10/13/13 1125 10/13/13 1655  GLUCAP 175* 135* 102* 151* 203*    No results found for this or any previous visit (from the past 240 hour(s)).   Studies: Ct Abdomen Pelvis Wo Contrast  10/12/2013   CLINICAL DATA:   Right upper quadrant pain, weakness  EXAM: CT ABDOMEN AND PELVIS WITHOUT CONTRAST  TECHNIQUE: Multidetector CT imaging of the abdomen and pelvis was performed following the standard protocol without IV contrast.  COMPARISON:  None.  FINDINGS: The lung bases are clear. Multivessel coronary artery atherosclerosis.  No renal, ureteral, or bladder calculi. No obstructive uropathy. No perinephric stranding is seen. The kidneys are symmetric in size without evidence for exophytic mass. The bladder is unremarkable.  The liver demonstrates no focal abnormality. There are multiple cholelithiasis. The spleen demonstrates no focal abnormality. The adrenal glands and pancreas are normal.  The stomach, duodenum, small intestine and large intestine are unremarkable. There is a normal caliber appendix in the right lower quadrant without periappendiceal inflammatory changes. There is no pneumoperitoneum, pneumatosis, or portal venous gas. There is no abdominal or pelvic free fluid. There is no lymphadenopathy.  The abdominal aorta is normal in caliber with atherosclerosis.  Degenerative disc disease and degenerative facet arthropathy throughout the lumbar spine. There is a moderate broad-based disc bulge at L5-S1 with severe bilateral facet arthropathy resulting in bilateral foraminal stenosis.  IMPRESSION: 1. Cholelithiasis. 2. No urolithiasis or obstructive uropathy. 3. Multi vessel coronary artery disease. 4. Lumbar spine spondylosis.   Electronically Signed   By: Kathreen Devoid   On: 10/12/2013 18:58    Scheduled Meds: . sodium chloride   Intravenous Once  . amitriptyline  50 mg Oral QHS  . amLODipine  10 mg Oral q morning - 10a  . antiseptic oral rinse  15 mL Mouth Rinse BID  . aspirin  325 mg Oral Daily  . atorvastatin  40 mg Oral q1800  . budesonide-formoterol  2 puff Inhalation BID  . carvedilol  25 mg Oral BID WC  . cloNIDine  0.2 mg Oral BID  . enoxaparin (LOVENOX) injection  40 mg Subcutaneous Daily  .  gabapentin  100 mg Oral BID   And  . gabapentin  300 mg Oral QHS  . gi cocktail  30 mL Oral Once  . insulin aspart  0-5 Units Subcutaneous QHS  . insulin aspart  0-9 Units Subcutaneous TID WC  . insulin glargine  25 Units Subcutaneous Daily  . lactose free nutrition  237 mL Oral TID WC  . [START ON 10/14/2013] levofloxacin  750 mg Oral Q48H  . levothyroxine  50 mcg Oral q morning - 10a  . nystatin  5 mL Oral QID  . pantoprazole (PROTONIX) IV  40 mg Intravenous Q24H  . senna-docusate  2 tablet Oral QHS  . tiotropium  18 mcg Inhalation Daily   Continuous Infusions: . sodium chloride      Principal Problem:   Nausea, vomiting and abdominal pain. Active Problems:   HYPOTHYROIDISM   DM   HYPERCHOLESTEROLEMIA   HYPERTENSION  CAD   COPD   CKD (chronic kidney disease) stage 3, GFR 30-59 ml/min   TIA (transient ischemic attack)   UTI (urinary tract infection)   Nausea and vomiting   Protein-calorie malnutrition, severe    Time spent: 42min    Hosie Poisson  [facility] Pager [phone]  If 7PM-7AM, please contact night-coverage at [email or web address], password [facility] 10/13/2013, 5:03 PM  LOS: 2 days

## 2013-10-13 NOTE — Consult Note (Signed)
Referring Provider: Dr. Algis Liming Primary Care Physician:  Dr. Mayra Neer Primary Gastroenterologist:  None (unassigned)  Reason for Consultation:  Odynophagia  HPI: [name] is a 79 y.o. female admitted 2 days ago with epigastric pain, nausea and vomiting in the setting of gallstones.  However, in the meantime, the patient gives a roughly 1 week history of significant odynophagia to both solids and liquids, where it hurts all the way down when swallowing. No problem with dysphagia or food impactions. Had been on antibiotics recently for an enterococcal UTI. Has not had previous endoscopic evaluation.  Incidentally, the patient indicates she has never had a screening colonoscopy although her primary physician has encouraged her to get one.  Past Medical History  Diagnosis Date  . Hypertension   . Macular degeneration, bilateral   . Type II diabetes mellitus   . COPD (chronic obstructive pulmonary disease)   . Diabetic peripheral neuropathy   . Hypercholesteremia   . PAD (peripheral artery disease)   . Chronic kidney disease   . Headache(784.0)   . Hypothyroid   . Coronary artery disease     Past Surgical History  Procedure Laterality Date  . Tonsillectomy  1940's  . Abdominal hysterectomy  1970's  . Dilation and curettage of uterus  1970's    "probably 2" (11/01/2012)  . Cataract extraction w/ intraocular lens  implant, bilateral Bilateral ~ 2010  . Bypass graft Right ~ 1997    RLE by Dr. Gwenlyn Perking 11/29/2005 (11/01/2012)  . Cystostomy w/ bladder biopsy  2005    Archie Endo 09/19/2003 (11/01/2012)  . Cardiac catheterization  11/29/2005    Archie Endo 11/29/2005 (11/01/2012)  . Anterior cervical decomp/discectomy fusion  01/02/2006    Archie Endo 01/02/2006 (11/01/2012)  . Shoulder open rotator cuff repair Bilateral 1990's-2000's    "3X on the left; twice on the right" (11/01/2012)    Prior to Admission medications   Medication Sig Start Date End Date Taking? Authorizing Provider   acetaminophen (TYLENOL) 500 MG tablet Take 500 mg by mouth every 4 (four) hours as needed for mild pain, fever or headache.   Yes Historical Provider, MD  albuterol (PROVENTIL) (2.5 MG/3ML) 0.083% nebulizer solution Take 2.5 mg by nebulization every 6 (six) hours as needed for wheezing.   Yes Historical Provider, MD  amitriptyline (ELAVIL) 50 MG tablet Take 50 mg by mouth at bedtime.   Yes Historical Provider, MD  amLODipine (NORVASC) 10 MG tablet Take 10 mg by mouth every morning.    Yes Historical Provider, MD  aspirin 325 MG tablet Take 1 tablet (325 mg total) by mouth daily. 10/10/13  Yes Kinnie Feil, MD  budesonide-formoterol (SYMBICORT) 160-4.5 MCG/ACT inhaler Inhale 2 puffs into the lungs 2 (two) times daily.   Yes Historical Provider, MD  carvedilol (COREG) 25 MG tablet Take 25 mg by mouth 2 (two) times daily with a meal. 0800 and 1700   Yes Historical Provider, MD  cloNIDine (CATAPRES) 0.2 MG tablet Take 0.2 mg by mouth 2 (two) times daily. Hold if blood pressure less than 100/60   Yes Historical Provider, MD  furosemide (LASIX) 40 MG tablet Take 40 mg by mouth daily.   Yes Historical Provider, MD  gabapentin (NEURONTIN) 100 MG capsule Take 100-300 mg by mouth 3 (three) times daily. 1 capsule at 0800, 1 capsule at 1400, and 3 capsules at 2000.   Yes Historical Provider, MD  guaiFENesin (ROBITUSSIN) 100 MG/5ML SOLN Take 10 mLs by mouth every 4 (four) hours as needed for cough or  to loosen phlegm.   Yes Historical Provider, MD  HYDROcodone-acetaminophen (NORCO/VICODIN) 5-325 MG per tablet Take 1 tablet by mouth every 8 (eight) hours as needed for moderate pain or severe pain. 10/10/13  Yes Kinnie Feil, MD  insulin glargine (LANTUS) 100 UNIT/ML injection Inject 0.4 mLs (40 Units total) into the skin at bedtime. 10/10/13  Yes Kinnie Feil, MD  insulin lispro (HUMALOG) 100 UNIT/ML injection Inject 0-14 Units into the skin 3 (three) times daily before meals. CBG greater than 350=14 units    Yes Historical Provider, MD  levothyroxine (SYNTHROID, LEVOTHROID) 50 MCG tablet Take 50 mcg by mouth every morning.    Yes Historical Provider, MD  Multiple Vitamins-Minerals (HEALTHY EYES) TABS Take 1 tablet by mouth every morning.    Yes Historical Provider, MD  polyethylene glycol (MIRALAX / GLYCOLAX) packet Take 17 g by mouth daily as needed for mild constipation, moderate constipation or severe constipation.    Yes Historical Provider, MD  promethazine (PHENERGAN) 25 MG tablet Take 25 mg by mouth every 6 (six) hours as needed for nausea or vomiting.   Yes Historical Provider, MD  rosuvastatin (CRESTOR) 20 MG tablet Take 20 mg by mouth every evening.    Yes Historical Provider, MD  senna-docusate (SENOKOT-S) 8.6-50 MG per tablet Take 2 tablets by mouth at bedtime.    Yes Historical Provider, MD  tiotropium (SPIRIVA) 18 MCG inhalation capsule Place 18 mcg into inhaler and inhale daily.   Yes Historical Provider, MD  ALPRAZolam (XANAX) 0.25 MG tablet Take 1 tablet (0.25 mg total) by mouth 2 (two) times daily as needed for anxiety. 10/10/13   Kinnie Feil, MD  levofloxacin (LEVAQUIN) 500 MG tablet Take 1 tablet (500 mg total) by mouth daily. 10/10/13   Kinnie Feil, MD    Current Facility-Administered Medications  Medication Dose Route Frequency Provider Last Rate Last Dose  . 0.9 %  sodium chloride infusion   Intravenous Once Hosie Poisson, MD 75 mL/hr at 10/12/13 1115    . acetaminophen (TYLENOL) tablet 650 mg  650 mg Oral Q6H PRN Modena Jansky, MD       Or  . acetaminophen (TYLENOL) suppository 650 mg  650 mg Rectal Q6H PRN Modena Jansky, MD      . albuterol (PROVENTIL) (2.5 MG/3ML) 0.083% nebulizer solution 2.5 mg  2.5 mg Nebulization Q6H PRN Modena Jansky, MD      . ALPRAZolam Duanne Moron) tablet 0.25 mg  0.25 mg Oral BID PRN Modena Jansky, MD      . alum & mag hydroxide-simeth (MAALOX/MYLANTA) 200-200-20 MG/5ML suspension 30 mL  30 mL Oral Q6H PRN Modena Jansky, MD       . amitriptyline (ELAVIL) tablet 50 mg  50 mg Oral QHS Modena Jansky, MD   50 mg at 10/12/13 2220  . amLODipine (NORVASC) tablet 10 mg  10 mg Oral q morning - 10a Modena Jansky, MD   10 mg at 10/13/13 1043  . antiseptic oral rinse (BIOTENE) solution 15 mL  15 mL Mouth Rinse BID Hosie Poisson, MD   15 mL at 10/13/13 1042  . aspirin tablet 325 mg  325 mg Oral Daily Modena Jansky, MD   325 mg at 10/13/13 1040  . atorvastatin (LIPITOR) tablet 40 mg  40 mg Oral q1800 Modena Jansky, MD   40 mg at 10/12/13 1914  . budesonide-formoterol (SYMBICORT) 160-4.5 MCG/ACT inhaler 2 puff  2 puff Inhalation BID Modena Jansky, MD  2 puff at 10/13/13 1138  . carvedilol (COREG) tablet 25 mg  25 mg Oral BID WC Modena Jansky, MD   25 mg at 10/13/13 1041  . cloNIDine (CATAPRES) tablet 0.2 mg  0.2 mg Oral BID Modena Jansky, MD   0.2 mg at 10/13/13 1044  . enoxaparin (LOVENOX) injection 40 mg  40 mg Subcutaneous Daily Modena Jansky, MD   40 mg at 10/13/13 1040  . gabapentin (NEURONTIN) capsule 100 mg  100 mg Oral BID Modena Jansky, MD   100 mg at 10/13/13 1051   And  . gabapentin (NEURONTIN) capsule 300 mg  300 mg Oral QHS Modena Jansky, MD   300 mg at 10/12/13 2220  . gi cocktail (Maalox,Lidocaine,Donnatal)  30 mL Oral Once Hosie Poisson, MD      . insulin aspart (novoLOG) injection 0-5 Units  0-5 Units Subcutaneous QHS Modena Jansky, MD      . insulin aspart (novoLOG) injection 0-9 Units  0-9 Units Subcutaneous TID WC Modena Jansky, MD   2 Units at 10/13/13 1301  . insulin glargine (LANTUS) injection 25 Units  25 Units Subcutaneous Daily Modena Jansky, MD   25 Units at 10/13/13 1043  . lactose free nutrition (BOOST PLUS) liquid 237 mL  237 mL Oral TID WC Darrol Jump, RD      . Derrill Memo ON 10/14/2013] levofloxacin (LEVAQUIN) tablet 750 mg  750 mg Oral Q48H Hosie Poisson, MD      . levothyroxine (SYNTHROID, LEVOTHROID) tablet 50 mcg  50 mcg Oral q morning - 10a Modena Jansky, MD   50  mcg at 10/13/13 1041  . morphine 2 MG/ML injection 2 mg  2 mg Intravenous Q4H PRN Modena Jansky, MD   2 mg at 10/12/13 2231  . ondansetron (ZOFRAN) tablet 4 mg  4 mg Oral Q6H PRN Modena Jansky, MD       Or  . ondansetron [facility]) injection 4 mg  4 mg Intravenous Q6H PRN Modena Jansky, MD   4 mg at 10/12/13 2231  . pantoprazole (PROTONIX) injection 40 mg  40 mg Intravenous Q24H Modena Jansky, MD   40 mg at 10/13/13 1039  . polyethylene glycol (MIRALAX / GLYCOLAX) packet 17 g  17 g Oral Daily PRN Modena Jansky, MD      . senna-docusate (Senokot-S) tablet 2 tablet  2 tablet Oral QHS Modena Jansky, MD   2 tablet at 10/12/13 2221  . tiotropium (SPIRIVA) inhalation capsule 18 mcg  18 mcg Inhalation Daily Modena Jansky, MD   18 mcg at 10/13/13 1137    Allergies as of 10/11/2013 - Review Complete 10/11/2013  Allergen Reaction Noted  . Codeine Other (See Comments) 03/02/2013  . Lisinopril Other (See Comments) 03/02/2013  . Penicillins Hives 01/23/2012  . Septra [sulfamethoxazole-trimethoprim] Hives and Rash 10/07/2013  . Sulfur Hives and Rash 10/31/2012    History reviewed. No pertinent family history.  History   Social History  . Marital Status: Divorced    Spouse Name: N/A    Number of Children: N/A  . Years of Education: N/A   Occupational History  . Not on file.   Social History Main Topics  . Smoking status: Current Every Day Smoker -- 0.12 packs/day for 60 years    Types: Cigarettes  . Smokeless tobacco: Never Used  . Alcohol Use: No  . Drug Use: No  . Sexual Activity: No   Other Topics Concern  .  Not on file   Social History Narrative   Lives with her Yolanda Bonine     Review of Systems: See history of present illness  Physical Exam: Vital signs in last 24 hours: Temp:  [98 F (36.7 C)-98.8 F (37.1 C)] 98.1 F (36.7 C) (05/31 1300) Pulse Rate:  [51-70] 51 (05/31 1300) Resp:  [18] 18 (05/31 1300) BP: (121-147)/(63-76) 147/63 mmHg (05/31  1300) SpO2:  [91 %-95 %] 93 % (05/31 1300) Last BM Date: 10/10/13  This is an overweight, pleasant, rather quiet Caucasian female, lying in bed in no distress. She appears to be cognitively intact. She is anicteric and without frank pallor. She has bilateral expiratory rhonchi. Heart is normal with respect to rhythm and rate, no murmurs or gallops appreciated. Abdomen is without guarding, mass effect, or tenderness  Intake/Output from previous day: 05/30 0701 - 05/31 0700 In: 1081.3 [P.O.:50; I.V.:1031.3] Out: -  Intake/Output this shift: Total I/O In: 240 [P.O.:240] Out: -   Lab Results:  Recent Labs  10/11/13 0400 10/12/13 0532 10/13/13 1030  WBC 14.6* 8.8 8.0  HGB 12.6 9.2* 10.1*  HCT 39.5 29.7* 31.0*  PLT 246 188 203   BMET  Recent Labs  10/11/13 0400 10/12/13 0532 10/13/13 1030  NA 143 143 143  K 4.9 4.7 4.1  CL 105 109 108  CO2 24 26 24   GLUCOSE 260* 106* 103*  BUN 20 25* 24*  CREATININE 1.14* 1.54* 1.59*  CALCIUM 9.9 8.8 9.2   LFT  Recent Labs  10/11/13 0400  PROT 7.2  ALBUMIN 3.1*  AST 13  ALT 10  ALKPHOS 73  BILITOT 0.2*   PT/INR No results found for this basename: LABPROT, INR,  in the last 72 hours  Studies/Results: Ct Abdomen Pelvis Wo Contrast  10/12/2013   CLINICAL DATA:  Right upper quadrant pain, weakness  EXAM: CT ABDOMEN AND PELVIS WITHOUT CONTRAST  TECHNIQUE: Multidetector CT imaging of the abdomen and pelvis was performed following the standard protocol without IV contrast.  COMPARISON:  None.  FINDINGS: The lung bases are clear. Multivessel coronary artery atherosclerosis.  No renal, ureteral, or bladder calculi. No obstructive uropathy. No perinephric stranding is seen. The kidneys are symmetric in size without evidence for exophytic mass. The bladder is unremarkable.  The liver demonstrates no focal abnormality. There are multiple cholelithiasis. The spleen demonstrates no focal abnormality. The adrenal glands and pancreas are normal.   The stomach, duodenum, small intestine and large intestine are unremarkable. There is a normal caliber appendix in the right lower quadrant without periappendiceal inflammatory changes. There is no pneumoperitoneum, pneumatosis, or portal venous gas. There is no abdominal or pelvic free fluid. There is no lymphadenopathy.  The abdominal aorta is normal in caliber with atherosclerosis.  Degenerative disc disease and degenerative facet arthropathy throughout the lumbar spine. There is a moderate broad-based disc bulge at L5-S1 with severe bilateral facet arthropathy resulting in bilateral foraminal stenosis.  IMPRESSION: 1. Cholelithiasis. 2. No urolithiasis or obstructive uropathy. 3. Multi vessel coronary artery disease. 4. Lumbar spine spondylosis.   Electronically Signed   By: Kathreen Devoid   On: 10/12/2013 18:58    Impression: 1. Odynophagia, very suggestive of esophageal candidiasis in this clinical setting 2. Cholelithiasis with recent symptoms somewhat compatible with biliary colic  Plan: 1. Endoscopic evaluation tomorrow. The nature, purpose, and risks of the procedure were reviewed with the patient and she is agreeable to proceed. The option of empiric therapy was discussed but I feel that endoscopic evaluation would  be more appropriate. She understands that the procedure will be done by my covering partner.  2. I would recommend surgical consultation to help discern whether a cholecystectomy would be appropriate for this patient.  3. The patient is a potential candidate for screening colonoscopy. She understands that, in her age group, it is optional rather than specifically recommended. She has mobility limitations which might make an inpatient prep more appropriate, so consideration can be given to this later in the patient's hospitalization.    LOS: 2 days   Herbie Baltimore Riggin Cuttino V  10/13/2013, 2:18 PM

## 2013-10-14 ENCOUNTER — Observation Stay (HOSPITAL_COMMUNITY): Payer: Medicare Other | Admitting: Anesthesiology

## 2013-10-14 ENCOUNTER — Encounter (HOSPITAL_COMMUNITY)
Admission: EM | Disposition: A | Discharge status: Skilled Nursing Facility | Payer: Medicare Other | Source: Home / Self Care | Attending: Emergency Medicine

## 2013-10-14 ENCOUNTER — Observation Stay (HOSPITAL_COMMUNITY): Payer: Medicare Other

## 2013-10-14 ENCOUNTER — Encounter (HOSPITAL_BASED_OUTPATIENT_CLINIC_OR_DEPARTMENT_OTHER): Payer: Medicare Other | Attending: Plastic Surgery

## 2013-10-14 ENCOUNTER — Encounter (HOSPITAL_COMMUNITY): Payer: Self-pay | Admitting: *Deleted

## 2013-10-14 ENCOUNTER — Encounter (HOSPITAL_COMMUNITY): Payer: Medicare Other | Admitting: Anesthesiology

## 2013-10-14 DIAGNOSIS — Z8673 Personal history of transient ischemic attack (TIA), and cerebral infarction without residual deficits: Secondary | ICD-10-CM | POA: Diagnosis not present

## 2013-10-14 DIAGNOSIS — F411 Generalized anxiety disorder: Secondary | ICD-10-CM | POA: Diagnosis not present

## 2013-10-14 DIAGNOSIS — N058 Unspecified nephritic syndrome with other morphologic changes: Secondary | ICD-10-CM | POA: Diagnosis not present

## 2013-10-14 DIAGNOSIS — E7800 Pure hypercholesterolemia, unspecified: Secondary | ICD-10-CM | POA: Diagnosis not present

## 2013-10-14 DIAGNOSIS — Z794 Long term (current) use of insulin: Secondary | ICD-10-CM | POA: Diagnosis not present

## 2013-10-14 DIAGNOSIS — R1013 Epigastric pain: Secondary | ICD-10-CM | POA: Diagnosis not present

## 2013-10-14 DIAGNOSIS — Z880 Allergy status to penicillin: Secondary | ICD-10-CM | POA: Diagnosis not present

## 2013-10-14 DIAGNOSIS — K259 Gastric ulcer, unspecified as acute or chronic, without hemorrhage or perforation: Secondary | ICD-10-CM | POA: Diagnosis not present

## 2013-10-14 DIAGNOSIS — Z79899 Other long term (current) drug therapy: Secondary | ICD-10-CM | POA: Diagnosis not present

## 2013-10-14 DIAGNOSIS — N390 Urinary tract infection, site not specified: Secondary | ICD-10-CM

## 2013-10-14 DIAGNOSIS — I2510 Atherosclerotic heart disease of native coronary artery without angina pectoris: Secondary | ICD-10-CM

## 2013-10-14 DIAGNOSIS — E1165 Type 2 diabetes mellitus with hyperglycemia: Secondary | ICD-10-CM | POA: Diagnosis not present

## 2013-10-14 DIAGNOSIS — D649 Anemia, unspecified: Secondary | ICD-10-CM | POA: Diagnosis not present

## 2013-10-14 DIAGNOSIS — E1149 Type 2 diabetes mellitus with other diabetic neurological complication: Secondary | ICD-10-CM | POA: Diagnosis not present

## 2013-10-14 DIAGNOSIS — Z8711 Personal history of peptic ulcer disease: Secondary | ICD-10-CM | POA: Diagnosis not present

## 2013-10-14 DIAGNOSIS — R0602 Shortness of breath: Secondary | ICD-10-CM

## 2013-10-14 DIAGNOSIS — R1110 Vomiting, unspecified: Secondary | ICD-10-CM | POA: Diagnosis present

## 2013-10-14 DIAGNOSIS — I519 Heart disease, unspecified: Secondary | ICD-10-CM

## 2013-10-14 DIAGNOSIS — H3530 Unspecified macular degeneration: Secondary | ICD-10-CM | POA: Diagnosis not present

## 2013-10-14 DIAGNOSIS — J449 Chronic obstructive pulmonary disease, unspecified: Secondary | ICD-10-CM | POA: Diagnosis not present

## 2013-10-14 DIAGNOSIS — E785 Hyperlipidemia, unspecified: Secondary | ICD-10-CM | POA: Diagnosis not present

## 2013-10-14 DIAGNOSIS — R1310 Dysphagia, unspecified: Secondary | ICD-10-CM | POA: Diagnosis not present

## 2013-10-14 DIAGNOSIS — I129 Hypertensive chronic kidney disease with stage 1 through stage 4 chronic kidney disease, or unspecified chronic kidney disease: Secondary | ICD-10-CM | POA: Diagnosis not present

## 2013-10-14 DIAGNOSIS — R799 Abnormal finding of blood chemistry, unspecified: Secondary | ICD-10-CM

## 2013-10-14 DIAGNOSIS — I10 Essential (primary) hypertension: Secondary | ICD-10-CM

## 2013-10-14 DIAGNOSIS — R1011 Right upper quadrant pain: Secondary | ICD-10-CM | POA: Diagnosis not present

## 2013-10-14 DIAGNOSIS — E039 Hypothyroidism, unspecified: Secondary | ICD-10-CM | POA: Diagnosis not present

## 2013-10-14 DIAGNOSIS — E119 Type 2 diabetes mellitus without complications: Secondary | ICD-10-CM

## 2013-10-14 DIAGNOSIS — K8020 Calculus of gallbladder without cholecystitis without obstruction: Secondary | ICD-10-CM | POA: Diagnosis not present

## 2013-10-14 DIAGNOSIS — R109 Unspecified abdominal pain: Secondary | ICD-10-CM | POA: Diagnosis not present

## 2013-10-14 DIAGNOSIS — F17200 Nicotine dependence, unspecified, uncomplicated: Secondary | ICD-10-CM | POA: Diagnosis not present

## 2013-10-14 DIAGNOSIS — E43 Unspecified severe protein-calorie malnutrition: Secondary | ICD-10-CM | POA: Diagnosis not present

## 2013-10-14 DIAGNOSIS — R112 Nausea with vomiting, unspecified: Secondary | ICD-10-CM

## 2013-10-14 DIAGNOSIS — E1129 Type 2 diabetes mellitus with other diabetic kidney complication: Secondary | ICD-10-CM | POA: Diagnosis not present

## 2013-10-14 DIAGNOSIS — E1142 Type 2 diabetes mellitus with diabetic polyneuropathy: Secondary | ICD-10-CM | POA: Diagnosis not present

## 2013-10-14 DIAGNOSIS — N183 Chronic kidney disease, stage 3 unspecified: Secondary | ICD-10-CM | POA: Diagnosis not present

## 2013-10-14 DIAGNOSIS — Z6832 Body mass index (BMI) 32.0-32.9, adult: Secondary | ICD-10-CM | POA: Diagnosis not present

## 2013-10-14 DIAGNOSIS — I739 Peripheral vascular disease, unspecified: Secondary | ICD-10-CM | POA: Diagnosis not present

## 2013-10-14 HISTORY — PX: ESOPHAGOGASTRODUODENOSCOPY: SHX5428

## 2013-10-14 LAB — GLUCOSE, CAPILLARY
GLUCOSE-CAPILLARY: 187 mg/dL — AB (ref 70–99)
Glucose-Capillary: 124 mg/dL — ABNORMAL HIGH (ref 70–99)
Glucose-Capillary: 130 mg/dL — ABNORMAL HIGH (ref 70–99)

## 2013-10-14 SURGERY — EGD (ESOPHAGOGASTRODUODENOSCOPY)
Anesthesia: Monitor Anesthesia Care

## 2013-10-14 MED ORDER — BUTAMBEN-TETRACAINE-BENZOCAINE 2-2-14 % EX AERO
INHALATION_SPRAY | CUTANEOUS | Status: DC | PRN
  Administered 2013-10-14: 2 via TOPICAL

## 2013-10-14 MED ORDER — FENTANYL CITRATE 0.05 MG/ML IJ SOLN
INTRAMUSCULAR | Status: DC | PRN
  Administered 2013-10-14 (×3): 12.5 ug via INTRAVENOUS

## 2013-10-14 MED ORDER — LACTATED RINGERS IV SOLN
INTRAVENOUS | Status: DC
  Administered 2013-10-14: 1000 mL via INTRAVENOUS

## 2013-10-14 MED ORDER — PROMETHAZINE HCL 25 MG/ML IJ SOLN: 6.2500 mg | INTRAMUSCULAR | Status: DC | PRN

## 2013-10-14 MED ORDER — FENTANYL CITRATE 0.05 MG/ML IJ SOLN
INTRAMUSCULAR | Status: AC
  Filled 2013-10-14: qty 2

## 2013-10-14 MED ORDER — MIDAZOLAM HCL 10 MG/2ML IJ SOLN
INTRAMUSCULAR | Status: AC
  Filled 2013-10-14: qty 2

## 2013-10-14 MED ORDER — MIDAZOLAM HCL 10 MG/2ML IJ SOLN
INTRAMUSCULAR | Status: DC | PRN
Start: 2013-10-14 — End: 2013-10-14
  Administered 2013-10-14 (×3): 1 mg via INTRAVENOUS

## 2013-10-14 NOTE — Consult Note (Addendum)
[name] December 19, 1934  [id number].    Requesting MD: Dr. Karleen Hampshire Chief Complaint/Reason for Consult: Abdominal pain, N/V  HPI:  79 year old female, with past medical history of HTN, type II DM with renal complications, CAD, PAD, stage III chronic kidney disease, COPD, tobacco abuse, B/L macular degeneration, hypothyroidism, recently hospitalized between 5/25-5/28 for TIA & enterococcus UTI, was discharged from [facility] on 5/28 to [facility] and returned on 5/29 with complaints of N/V and upper abdominal pain. Soon after reaching the SNF she developed epigastric abdominal pain associated with N/V. She describes the abdominal pain is sharp, intermittent, and radiates and wraps around her upper abdomen.  This was associated with 3 episodes of nonbloody emesis which she describes as bilious.  No additional episodes of vomiting, but she still has the epigastric and RUQ pain.  She recalls that she has had this abdominal pain for the last 2 weeks precipitated by ANY food/drink almost instantaneously during/immediately after eating.  She gives history of PUD remotely diagnosed by EGD. She denies history of NSAID use.  Admits to symptoms of reflux and odontphagia.  No reported fever or chills.  +dysuria.  No h/o similar abdominal pain.  H/o hysterectomy, but no other abdominal surgeries.     ROS: All systems reviewed and otherwise negative except for as above  History reviewed. No pertinent family history.  Past Medical History  Diagnosis Date  . Hypertension   . Macular degeneration, bilateral   . Type II diabetes mellitus   . COPD (chronic obstructive pulmonary disease)   . Diabetic peripheral neuropathy   . Hypercholesteremia   . PAD (peripheral artery disease)   . Chronic kidney disease   . Headache(784.0)   . Hypothyroid   . Coronary artery disease     Past Surgical History  Procedure Laterality Date  . Tonsillectomy  1940's  . Abdominal hysterectomy  1970's  . Dilation and curettage of  uterus  1970's    "probably 2" (11/01/2012)  . Cataract extraction w/ intraocular lens  implant, bilateral Bilateral ~ 2010  . Bypass graft Right ~ 1997    RLE by Dr. Gwenlyn Perking 11/29/2005 (11/01/2012)  . Cystostomy w/ bladder biopsy  2005    Archie Endo 09/19/2003 (11/01/2012)  . Anterior cervical decomp/discectomy fusion  01/02/2006    Archie Endo 01/02/2006 (11/01/2012)  . Shoulder open rotator cuff repair Bilateral 1990's-2000's    "3X on the left; twice on the right" (11/01/2012)  . Cardiac catheterization  11/29/2005    Archie Endo 11/29/2005 (11/01/2012)    Social History:  reports that she has been smoking Cigarettes.  She has a 7.2 pack-year smoking history. She has never used smokeless tobacco. She reports that she does not drink alcohol or use illicit drugs.  Allergies:  Allergies  Allergen Reactions  . Codeine Other (See Comments)    unknown  . Lisinopril Other (See Comments)    unknown  . Penicillins Hives  . Septra [Sulfamethoxazole-Trimethoprim] Hives and Rash  . Sulfur Hives and Rash    Break out     Medications Prior to Admission  Medication Sig Dispense Refill  . acetaminophen (TYLENOL) 500 MG tablet Take 500 mg by mouth every 4 (four) hours as needed for mild pain, fever or headache.      . albuterol (PROVENTIL) (2.5 MG/3ML) 0.083% nebulizer solution Take 2.5 mg by nebulization every 6 (six) hours as needed for wheezing.      Marland Kitchen amitriptyline (ELAVIL) 50 MG tablet Take 50 mg by mouth at bedtime.      Marland Kitchen  amLODipine (NORVASC) 10 MG tablet Take 10 mg by mouth every morning.       Marland Kitchen aspirin 325 MG tablet Take 1 tablet (325 mg total) by mouth daily.      . budesonide-formoterol (SYMBICORT) 160-4.5 MCG/ACT inhaler Inhale 2 puffs into the lungs 2 (two) times daily.      . carvedilol (COREG) 25 MG tablet Take 25 mg by mouth 2 (two) times daily with a meal. 0800 and 1700      . cloNIDine (CATAPRES) 0.2 MG tablet Take 0.2 mg by mouth 2 (two) times daily. Hold if blood pressure less than 100/60       . furosemide (LASIX) 40 MG tablet Take 40 mg by mouth daily.      Marland Kitchen gabapentin (NEURONTIN) 100 MG capsule Take 100-300 mg by mouth 3 (three) times daily. 1 capsule at 0800, 1 capsule at 1400, and 3 capsules at 2000.      Marland Kitchen guaiFENesin (ROBITUSSIN) 100 MG/5ML SOLN Take 10 mLs by mouth every 4 (four) hours as needed for cough or to loosen phlegm.      Marland Kitchen HYDROcodone-acetaminophen (NORCO/VICODIN) 5-325 MG per tablet Take 1 tablet by mouth every 8 (eight) hours as needed for moderate pain or severe pain.  15 tablet  0  . insulin glargine (LANTUS) 100 UNIT/ML injection Inject 0.4 mLs (40 Units total) into the skin at bedtime.  10 mL  11  . insulin lispro (HUMALOG) 100 UNIT/ML injection Inject 0-14 Units into the skin 3 (three) times daily before meals. CBG greater than 350=14 units      . levothyroxine (SYNTHROID, LEVOTHROID) 50 MCG tablet Take 50 mcg by mouth every morning.       . Multiple Vitamins-Minerals (HEALTHY EYES) TABS Take 1 tablet by mouth every morning.       . polyethylene glycol (MIRALAX / GLYCOLAX) packet Take 17 g by mouth daily as needed for mild constipation, moderate constipation or severe constipation.       . promethazine (PHENERGAN) 25 MG tablet Take 25 mg by mouth every 6 (six) hours as needed for nausea or vomiting.      . rosuvastatin (CRESTOR) 20 MG tablet Take 20 mg by mouth every evening.       . senna-docusate (SENOKOT-S) 8.6-50 MG per tablet Take 2 tablets by mouth at bedtime.       Marland Kitchen tiotropium (SPIRIVA) 18 MCG inhalation capsule Place 18 mcg into inhaler and inhale daily.      Marland Kitchen ALPRAZolam (XANAX) 0.25 MG tablet Take 1 tablet (0.25 mg total) by mouth 2 (two) times daily as needed for anxiety.  30 tablet  0  . levofloxacin (LEVAQUIN) 500 MG tablet Take 1 tablet (500 mg total) by mouth daily.  3 tablet  0    Blood pressure 166/52, pulse 51, temperature 98.3 F (36.8 C), temperature source Oral, resp. rate 19, height '5\' 3"'  (1.6 m), weight 178 lb 12.7 oz (81.1 kg), SpO2  96.00%. Physical Exam: General: pleasant, WD/WN white female who is laying in bed in NAD HEENT: head is normocephalic, atraumatic.  Sclera are noninjected.  PERRL.  Ears and nose without any masses or lesions.  Mouth is pink and moist Heart: regular, rate, and rhythm.  No obvious murmurs, gallops, or rubs noted.  Palpable pedal pulses bilaterally Lungs: CTAB, no wheezes, rhonchi, or rales noted.  Respiratory effort nonlabored Abd: obese, soft, ND, tender in upper abdomen greatest in the RUQ, +BS, no masses, hernias, or organomegaly, lower horizontal well healed  scar MS: all 4 extremities are symmetrical with no cyanosis, clubbing, or edema. Skin: warm and dry with no masses, lesions, or rashes Psych: A&Ox3 with an appropriate affect.   Results for orders placed during the hospital encounter of 10/11/13 (from the past 48 hour(s))  GLUCOSE, CAPILLARY     Status: Abnormal   Collection Time    10/12/13  5:02 PM      Result Value Ref Range   Glucose-Capillary 175 (*) 70 - 99 mg/dL  GLUCOSE, CAPILLARY     Status: Abnormal   Collection Time    10/12/13  9:31 PM      Result Value Ref Range   Glucose-Capillary 135 (*) 70 - 99 mg/dL  GLUCOSE, CAPILLARY     Status: Abnormal   Collection Time    10/13/13  7:17 AM      Result Value Ref Range   Glucose-Capillary 102 (*) 70 - 99 mg/dL  CBC     Status: Abnormal   Collection Time    10/13/13 10:30 AM      Result Value Ref Range   WBC 8.0  4.0 - 10.5 K/uL   RBC 3.42 (*) 3.87 - 5.11 MIL/uL   Hemoglobin 10.1 (*) 12.0 - 15.0 g/dL   HCT 31.0 (*) 36.0 - 46.0 %   MCV 90.6  78.0 - 100.0 fL   MCH 29.5  26.0 - 34.0 pg   MCHC 32.6  30.0 - 36.0 g/dL   RDW 13.1  11.5 - 15.5 %   Platelets 203  150 - 400 K/uL  BASIC METABOLIC PANEL     Status: Abnormal   Collection Time    10/13/13 10:30 AM      Result Value Ref Range   Sodium 143  137 - 147 mEq/L   Potassium 4.1  3.7 - 5.3 mEq/L   Chloride 108  96 - 112 mEq/L   CO2 24  19 - 32 mEq/L   Glucose, Bld  103 (*) 70 - 99 mg/dL   BUN 24 (*) 6 - 23 mg/dL   Creatinine, Ser 1.59 (*) 0.50 - 1.10 mg/dL   Calcium 9.2  8.4 - 10.5 mg/dL   GFR calc non Af Amer 30 (*) >90 mL/min   GFR calc Af Amer 35 (*) >90 mL/min   Comment: (NOTE)     The eGFR has been calculated using the CKD EPI equation.     This calculation has not been validated in all clinical situations.     eGFR's persistently <90 mL/min signify possible Chronic Kidney     Disease.  GLUCOSE, CAPILLARY     Status: Abnormal   Collection Time    10/13/13 11:25 AM      Result Value Ref Range   Glucose-Capillary 151 (*) 70 - 99 mg/dL  GLUCOSE, CAPILLARY     Status: Abnormal   Collection Time    10/13/13  4:55 PM      Result Value Ref Range   Glucose-Capillary 203 (*) 70 - 99 mg/dL  GLUCOSE, CAPILLARY     Status: Abnormal   Collection Time    10/13/13  9:11 PM      Result Value Ref Range   Glucose-Capillary 164 (*) 70 - 99 mg/dL   Comment 1 Notify RN    GLUCOSE, CAPILLARY     Status: Abnormal   Collection Time    10/14/13  7:49 AM      Result Value Ref Range   Glucose-Capillary 124 (*) 70 - 99  mg/dL   Comment 1 Notify RN     Ct Abdomen Pelvis Wo Contrast  10/12/2013   CLINICAL DATA:  Right upper quadrant pain, weakness  EXAM: CT ABDOMEN AND PELVIS WITHOUT CONTRAST  TECHNIQUE: Multidetector CT imaging of the abdomen and pelvis was performed following the standard protocol without IV contrast.  COMPARISON:  None.  FINDINGS: The lung bases are clear. Multivessel coronary artery atherosclerosis.  No renal, ureteral, or bladder calculi. No obstructive uropathy. No perinephric stranding is seen. The kidneys are symmetric in size without evidence for exophytic mass. The bladder is unremarkable.  The liver demonstrates no focal abnormality. There are multiple cholelithiasis. The spleen demonstrates no focal abnormality. The adrenal glands and pancreas are normal.  The stomach, duodenum, small intestine and large intestine are unremarkable. There  is a normal caliber appendix in the right lower quadrant without periappendiceal inflammatory changes. There is no pneumoperitoneum, pneumatosis, or portal venous gas. There is no abdominal or pelvic free fluid. There is no lymphadenopathy.  The abdominal aorta is normal in caliber with atherosclerosis.  Degenerative disc disease and degenerative facet arthropathy throughout the lumbar spine. There is a moderate broad-based disc bulge at L5-S1 with severe bilateral facet arthropathy resulting in bilateral foraminal stenosis.  IMPRESSION: 1. Cholelithiasis. 2. No urolithiasis or obstructive uropathy. 3. Multi vessel coronary artery disease. 4. Lumbar spine spondylosis.   Electronically Signed   By: Kathreen Devoid   On: 10/12/2013 18:58      Assessment/Plan Upper abdominal pain Nausea/vomiting Gastroesophageal ulcer at the GE junction Odynophagia UTI Multiple medical problems including CAD, CKD III, recent TIA, COPD  Plan: 1.  Her history to me sounds more like ulcer disease as opposed to a story consistent with cholecystitis or symptomatic gallstones.  However, she may need a HIDA scan to further evaluate whether she has a functioning cystic duct or if she may have biliary dyskinesia.  2.  NPO,  IVF, hold pain control until after HIDA 3.  SCD's and on aspirin, would need this held, on lovenox 4.  Would need cardiac clearance and to be off blood thinners if surgery is indicated 5.  Will follow   Coralie Keens, [facility] Surgery 10/14/2013, 12:27 PM Pager: [phone]  Agree with above. HIDA scan.  If her HIDA is positive, we can plan to go ahead with surgery.  If the HIDA is negative, it will be a longer discussion. Niece, Lyman Bishop, at bedside.  The patient lives at [facility], assisted living.  She has 4 children, 3 live locally.  Her son, [name] [phone]) has medical POA.  She has some short term memory problems, so for any decisions, her son needs to be  contacted.  Alphonsa Overall, MD, [facility] Surgery Pager: [phone] Office phone:  [phone]

## 2013-10-14 NOTE — Consult Note (Addendum)
I have seen and evaluated the patient this PM along with Ellen Henri, PA. I agree with her findings, examination as well as impression recommendations.  79 y/o woman with h/o PAD & ~non-obstructive CAD by cath in the past (for abnormal ST -- ? False +) admitted with SSx of active Gall Bladder disease vs. PUD.    We are consulted for potential Pre-operative Evaluation.  Relatively inactive due to OA discomfort, but has not had any Angina or CHF Sx since her cath.  Echo relatively normal function last hospital stay (recent).  All of her EKGs have RBBB - but the most recent one has Ant-Lat Q waves -- Echo does not show Ant-Lat Infarct with normal Wall Motion.    She does have DM on Insulin & had a recent ~TIA reported, mild CKD - but Cr < 2, no CHF, no Angina (active CAD Sx).  Her Surgery is intraperitoneal & therefore "HIGH RISK" ( however Lap Chole is not HIGH RISK)   PREOPERATIVE CARDIAC RISK ASSESSMENT   Revised Cardiac Risk Index:  High Risk Surgery: yes; ~ intraperitoneal (but not vascular)  Defined as Intraperitoneal, intrathoracic or suprainguinal vascular  Active CAD: no;   CHF: no;   Cerebrovascular Disease: Yes; last admission TIA suggested  Diabetes: yes; On Insulin: yes  CKD (Cr >~ 2): no; 1.5-1.6  Total: 3 Estimated Risk of Adverse Outcome: ~Moderate-Severe  Estimated Risk of MI, PE, VF/VT (Cardiac Arrest), Complete Heart Block: 6.6 -10% - reduced to ~3.3-6.6% with being on chronic BB dose. These risk factors are due to existing conditions & not able to be adjusted for.  This should not, however preclude proceeding with her surgery.   ACC/AHA Guidelines for "Clearance":  Step 1 - Need for Emergency Surgery: No: but ~urgent / time sensative   If Yes - go straight to OR with perioperative surveillance  Step 2 - Active Cardiac Conditions (Unstable Angina, Decompensated HF, Significant  Arrhytmias - Complete HB, Mobitz II, Symptomatic VT or SVT, Severe Aortic  Stenosis - mean gradient > 40 mmHg, Valve area < 1.0 cm2):   No:   If Yes - Evaluate & Treat per ACC/AHA Guidelines  Step 3 -  Low Risk Surgery: No: Intraperitoneal  If Yes --> proceed to OR  If No --> Step 4  Step 4 - Functional Capacity >= 4 METS without symptoms: No: but due to OA Sx.  If Yes --> proceed to OR  If No --> Step 5  Step 5 --  Clinical Risk Factors (CRF)   3 or more: No: 2  1-2 or more CRFs: Yes  If Yes -- assess Surgical Risk, --   (High Risk Non-cardiac), Intraabdominal or thoracic vascular surgery --> Proceed to OR, or consider testing if it will change management  Intermediate Risk: Proceed to OR with HR control, or consider testing if it will change management  Based upon these recommendations - I do not feel that any additional assessment is warranted as she has had a false + ST in the past which if repeated would only delay her surgery.  In the absence of Sx, she probably would not benefit from revascularization pre-operatively.    We will be standing by post-operatively if problems arise - but ST with potential Cath would only delay her surgery & not likely otherwise improve her outcome.   Lung sounds consistent with ? Atelectasis -- check CXR Also repeat EKG - 1st attempt prelim report says ~Afib (looks more like several PACs) 2nd less  erratic -- Lateral Qs no longer present (this suggests abnormal lead placement yesterday as Q waves are not transient findings),.  Ok to hold ASA. Continue BB dose (as lon as HR not persistently < 50 bpm.  MD Time with pt: 25 min  Leonie Man, M.D., M.S. Interventional Cardiologist   Pager # [phone] 10/14/2013

## 2013-10-14 NOTE — Progress Notes (Signed)
GI followup:  EGD revealed a relatively shallow ulcer at the GE junction with no obvious stricture, no proximal abnormalities such as candidal esophagitis. Will treat with proton pump inhibitor and Carafate to see if this alleviates her odynophagia

## 2013-10-14 NOTE — Progress Notes (Addendum)
Patient had 12 Lead EKG done results were abnormal, patient in stable condition at this time, BP= 144/70,P=53, Sats=93 % on RA, Temp=98.2, page Internal Medicine doctor, was inform to page the Cardiologist on call

## 2013-10-14 NOTE — Progress Notes (Signed)
Patient had 12 lead EKG done with abnormal results, patient in stable condition at this time, BP=144/70, P=53,Sats=93% on RA, Temp=98.2 (O), Dr Ellyn Hack (Cardiology) notified of patient's results, view findings in [place]

## 2013-10-14 NOTE — Progress Notes (Signed)
Clinical Social Work  Per chart review, patient is not medically stable to DC. CSW left a message with [facility] to update SNF on DC plans. CSW will continue to follow.  [place], [place] [phone]

## 2013-10-14 NOTE — Anesthesia Preprocedure Evaluation (Deleted)
Anesthesia Evaluation  Patient identified by MRN, date of birth, ID band Patient awake    Reviewed: Allergy & Precautions, H&P , NPO status , Patient's Chart, lab work & pertinent test results  Airway Mallampati: II TM Distance: >3 FB Neck ROM: Full    Dental no notable dental hx.    Pulmonary COPDCurrent Smoker,  breath sounds clear to auscultation  Pulmonary exam normal       Cardiovascular hypertension, Pt. on medications + CAD and + Peripheral Vascular Disease Rhythm:Regular Rate:Normal     Neuro/Psych TIA 5/25 TIAnegative psych ROS   GI/Hepatic negative GI ROS, Neg liver ROS,   Endo/Other  negative endocrine ROSdiabetesHypothyroidism   Renal/GU Renal InsufficiencyRenal disease  negative genitourinary   Musculoskeletal negative musculoskeletal ROS (+)   Abdominal   Peds negative pediatric ROS (+)  Hematology  (+) anemia ,   Anesthesia Other Findings   Reproductive/Obstetrics negative OB ROS                         Anesthesia Physical Anesthesia Plan  ASA: III  Anesthesia Plan: MAC   Post-op Pain Management:    Induction: Intravenous  Airway Management Planned: Nasal Cannula  Additional Equipment:   Intra-op Plan:   Post-operative Plan:   Informed Consent: I have reviewed the patients History and Physical, chart, labs and discussed the procedure including the risks, benefits and alternatives for the proposed anesthesia with the patient or authorized representative who has indicated his/her understanding and acceptance.   Dental advisory given  Plan Discussed with: CRNA and Surgeon  Anesthesia Plan Comments:         Anesthesia Quick Evaluation

## 2013-10-14 NOTE — Consult Note (Signed)
Reason for Consult: Preoperative Clearance Referring Physician: General Surgery   HPI: The patient is a 79 y/o female, seen by Dr. Johnsie Cancel in the past. She states that she was followed by another cardiologist, but has not been followed since he retired, nearly 4 years ago. She cannot recall his name. Her PMH is significant for longstanding type 2 diabetes mellitus, HTN, stage III CKD, PUD, tobacco abuse, PVD, s/p right lower extremity bypass grafting by Dr. Donnetta Hutching ~ 1997 and CAD. She had an abnormal nuclear stress test in 2012 that was conducted during a preoperative assessment prior to planned cervical disk surgery. Her myocardial perfusion study was abnormal indicating moderate  inferolateral scar with anteroapical ischemia and ejection fraction of 62%. Echocardiography also suggest normal ventricular function. She underwent a LHC by Dr. Domenic Polite, which demonstrated relatively small caliber coronary vessels, felt likely due to her longstanding diabetes mellitus. There was 75% stenoses in the mid to distal circumflex/obtuse marginal system, although these vessels were relatively small in caliber. The  right coronary artery was also small in caliber and diffusely diseased, although not obstructive in nature.  Left ventricular ejection fraction was approximately 55% with mid posterior hypokinesis. There was no significant mitral regurgitation and left ventricular end-diastolic pressure was 15 mmHg. Medical therapy was recommended as she had not endorsed any anginal symptoms at that time. Sine then, she has continued on 325 mg of ASA and 25 mg of Coreg BID.   She was recently hospitalized between 5/25-5/28 for TIA & enterococcus UTI, was discharged from [facility] to [facility]. She presented to [facility] on 5/29 with a complained of RUQ abdominal pain with associated nausea, and non-bloody, non-bilious emesis. CT of abdomin was positive for gall stones. GI was consulted for EGD, which demonstrated a  relatively shallow ulcer at the GE junction with no obvious stricture and no proximal abnormalities such as candidal esophagitis. General surgery was consulted and has recommended a HIDA Scan. If postive, she will need to undergo surgery.   Cardiology has been consulted for peroperative clearance and for clearance to be off of blood thiners if surgery is indicated. She states that she is very sedentary due to generalized weakness and bilateral hip pain. She ambulates mostly with use of a wheelchair and walker. She denies any chest pain, pressure, tightness, heaviness. No SOB, orthopnea, PND or LEE. She also denies syncope/ near syncope. She states that she has been compliant with her medications, but has continued to smoke, on average 4 cigarettes a day. She had a 2D echo in May of this year that demonstrated normal systolic function, with normal wall motion.   Past Medical History  Diagnosis Date  . Hypertension   . Macular degeneration, bilateral   . Type II diabetes mellitus   . COPD (chronic obstructive pulmonary disease)   . Diabetic peripheral neuropathy   . Hypercholesteremia   . PAD (peripheral artery disease)   . Chronic kidney disease   . Headache(784.0)   . Hypothyroid   . Coronary artery disease     Past Surgical History  Procedure Laterality Date  . Tonsillectomy  1940's  . Abdominal hysterectomy  1970's  . Dilation and curettage of uterus  1970's    "probably 2" (11/01/2012)  . Cataract extraction w/ intraocular lens  implant, bilateral Bilateral ~ 2010  . Bypass graft Right ~ 1997    RLE by Dr. Gwenlyn Perking 11/29/2005 (11/01/2012)  . Cystostomy w/ bladder biopsy  2005    Archie Endo 09/19/2003 (11/01/2012)  .  Anterior cervical decomp/discectomy fusion  01/02/2006    Archie Endo 01/02/2006 (11/01/2012)  . Shoulder open rotator cuff repair Bilateral 1990's-2000's    "3X on the left; twice on the right" (11/01/2012)  . Cardiac catheterization  11/29/2005    Archie Endo 11/29/2005 (11/01/2012)     History reviewed. No pertinent family history.  Social History:  reports that she has been smoking Cigarettes.  She has a 7.2 pack-year smoking history. She has never used smokeless tobacco. She reports that she does not drink alcohol or use illicit drugs.  Allergies:  Allergies  Allergen Reactions  . Codeine Other (See Comments)    unknown  . Lisinopril Other (See Comments)    unknown  . Penicillins Hives  . Septra [Sulfamethoxazole-Trimethoprim] Hives and Rash  . Sulfur Hives and Rash    Break out     Medications:  Prior to Admission medications   Medication Sig Start Date End Date Taking? Authorizing Provider  acetaminophen (TYLENOL) 500 MG tablet Take 500 mg by mouth every 4 (four) hours as needed for mild pain, fever or headache.   Yes Historical Provider, MD  albuterol (PROVENTIL) (2.5 MG/3ML) 0.083% nebulizer solution Take 2.5 mg by nebulization every 6 (six) hours as needed for wheezing.   Yes Historical Provider, MD  amitriptyline (ELAVIL) 50 MG tablet Take 50 mg by mouth at bedtime.   Yes Historical Provider, MD  amLODipine (NORVASC) 10 MG tablet Take 10 mg by mouth every morning.    Yes Historical Provider, MD  aspirin 325 MG tablet Take 1 tablet (325 mg total) by mouth daily. 10/10/13  Yes Kinnie Feil, MD  budesonide-formoterol (SYMBICORT) 160-4.5 MCG/ACT inhaler Inhale 2 puffs into the lungs 2 (two) times daily.   Yes Historical Provider, MD  carvedilol (COREG) 25 MG tablet Take 25 mg by mouth 2 (two) times daily with a meal. 0800 and 1700   Yes Historical Provider, MD  cloNIDine (CATAPRES) 0.2 MG tablet Take 0.2 mg by mouth 2 (two) times daily. Hold if blood pressure less than 100/60   Yes Historical Provider, MD  furosemide (LASIX) 40 MG tablet Take 40 mg by mouth daily.   Yes Historical Provider, MD  gabapentin (NEURONTIN) 100 MG capsule Take 100-300 mg by mouth 3 (three) times daily. 1 capsule at 0800, 1 capsule at 1400, and 3 capsules at 2000.   Yes Historical  Provider, MD  guaiFENesin (ROBITUSSIN) 100 MG/5ML SOLN Take 10 mLs by mouth every 4 (four) hours as needed for cough or to loosen phlegm.   Yes Historical Provider, MD  HYDROcodone-acetaminophen (NORCO/VICODIN) 5-325 MG per tablet Take 1 tablet by mouth every 8 (eight) hours as needed for moderate pain or severe pain. 10/10/13  Yes Kinnie Feil, MD  insulin glargine (LANTUS) 100 UNIT/ML injection Inject 0.4 mLs (40 Units total) into the skin at bedtime. 10/10/13  Yes Kinnie Feil, MD  insulin lispro (HUMALOG) 100 UNIT/ML injection Inject 0-14 Units into the skin 3 (three) times daily before meals. CBG greater than 350=14 units   Yes Historical Provider, MD  levothyroxine (SYNTHROID, LEVOTHROID) 50 MCG tablet Take 50 mcg by mouth every morning.    Yes Historical Provider, MD  Multiple Vitamins-Minerals (HEALTHY EYES) TABS Take 1 tablet by mouth every morning.    Yes Historical Provider, MD  polyethylene glycol (MIRALAX / GLYCOLAX) packet Take 17 g by mouth daily as needed for mild constipation, moderate constipation or severe constipation.    Yes Historical Provider, MD  promethazine (PHENERGAN) 25 MG tablet  Take 25 mg by mouth every 6 (six) hours as needed for nausea or vomiting.   Yes Historical Provider, MD  rosuvastatin (CRESTOR) 20 MG tablet Take 20 mg by mouth every evening.    Yes Historical Provider, MD  senna-docusate (SENOKOT-S) 8.6-50 MG per tablet Take 2 tablets by mouth at bedtime.    Yes Historical Provider, MD  tiotropium (SPIRIVA) 18 MCG inhalation capsule Place 18 mcg into inhaler and inhale daily.   Yes Historical Provider, MD  ALPRAZolam (XANAX) 0.25 MG tablet Take 1 tablet (0.25 mg total) by mouth 2 (two) times daily as needed for anxiety. 10/10/13   Kinnie Feil, MD  levofloxacin (LEVAQUIN) 500 MG tablet Take 1 tablet (500 mg total) by mouth daily. 10/10/13   Kinnie Feil, MD     Results for orders placed during the hospital encounter of 10/11/13 (from the past 48  hour(s))  GLUCOSE, CAPILLARY     Status: Abnormal   Collection Time    10/12/13  9:31 PM      Result Value Ref Range   Glucose-Capillary 135 (*) 70 - 99 mg/dL  GLUCOSE, CAPILLARY     Status: Abnormal   Collection Time    10/13/13  7:17 AM      Result Value Ref Range   Glucose-Capillary 102 (*) 70 - 99 mg/dL  CBC     Status: Abnormal   Collection Time    10/13/13 10:30 AM      Result Value Ref Range   WBC 8.0  4.0 - 10.5 K/uL   RBC 3.42 (*) 3.87 - 5.11 MIL/uL   Hemoglobin 10.1 (*) 12.0 - 15.0 g/dL   HCT 31.0 (*) 36.0 - 46.0 %   MCV 90.6  78.0 - 100.0 fL   MCH 29.5  26.0 - 34.0 pg   MCHC 32.6  30.0 - 36.0 g/dL   RDW 13.1  11.5 - 15.5 %   Platelets 203  150 - 400 K/uL  BASIC METABOLIC PANEL     Status: Abnormal   Collection Time    10/13/13 10:30 AM      Result Value Ref Range   Sodium 143  137 - 147 mEq/L   Potassium 4.1  3.7 - 5.3 mEq/L   Chloride 108  96 - 112 mEq/L   CO2 24  19 - 32 mEq/L   Glucose, Bld 103 (*) 70 - 99 mg/dL   BUN 24 (*) 6 - 23 mg/dL   Creatinine, Ser 1.59 (*) 0.50 - 1.10 mg/dL   Calcium 9.2  8.4 - 10.5 mg/dL   GFR calc non Af Amer 30 (*) >90 mL/min   GFR calc Af Amer 35 (*) >90 mL/min   Comment: (NOTE)     The eGFR has been calculated using the CKD EPI equation.     This calculation has not been validated in all clinical situations.     eGFR's persistently <90 mL/min signify possible Chronic Kidney     Disease.  GLUCOSE, CAPILLARY     Status: Abnormal   Collection Time    10/13/13 11:25 AM      Result Value Ref Range   Glucose-Capillary 151 (*) 70 - 99 mg/dL  GLUCOSE, CAPILLARY     Status: Abnormal   Collection Time    10/13/13  4:55 PM      Result Value Ref Range   Glucose-Capillary 203 (*) 70 - 99 mg/dL  GLUCOSE, CAPILLARY     Status: Abnormal   Collection Time  10/13/13  9:11 PM      Result Value Ref Range   Glucose-Capillary 164 (*) 70 - 99 mg/dL   Comment 1 Notify RN    GLUCOSE, CAPILLARY     Status: Abnormal   Collection Time     10/14/13  7:49 AM      Result Value Ref Range   Glucose-Capillary 124 (*) 70 - 99 mg/dL   Comment 1 Notify RN    GLUCOSE, CAPILLARY     Status: Abnormal   Collection Time    10/14/13 12:23 PM      Result Value Ref Range   Glucose-Capillary 130 (*) 70 - 99 mg/dL   Comment 1 Notify RN    GLUCOSE, CAPILLARY     Status: Abnormal   Collection Time    10/14/13  5:15 PM      Result Value Ref Range   Glucose-Capillary 187 (*) 70 - 99 mg/dL   Comment 1 Notify RN      Ct Abdomen Pelvis Wo Contrast  10/12/2013   CLINICAL DATA:  Right upper quadrant pain, weakness  EXAM: CT ABDOMEN AND PELVIS WITHOUT CONTRAST  TECHNIQUE: Multidetector CT imaging of the abdomen and pelvis was performed following the standard protocol without IV contrast.  COMPARISON:  None.  FINDINGS: The lung bases are clear. Multivessel coronary artery atherosclerosis.  No renal, ureteral, or bladder calculi. No obstructive uropathy. No perinephric stranding is seen. The kidneys are symmetric in size without evidence for exophytic mass. The bladder is unremarkable.  The liver demonstrates no focal abnormality. There are multiple cholelithiasis. The spleen demonstrates no focal abnormality. The adrenal glands and pancreas are normal.  The stomach, duodenum, small intestine and large intestine are unremarkable. There is a normal caliber appendix in the right lower quadrant without periappendiceal inflammatory changes. There is no pneumoperitoneum, pneumatosis, or portal venous gas. There is no abdominal or pelvic free fluid. There is no lymphadenopathy.  The abdominal aorta is normal in caliber with atherosclerosis.  Degenerative disc disease and degenerative facet arthropathy throughout the lumbar spine. There is a moderate broad-based disc bulge at L5-S1 with severe bilateral facet arthropathy resulting in bilateral foraminal stenosis.  IMPRESSION: 1. Cholelithiasis. 2. No urolithiasis or obstructive uropathy. 3. Multi vessel coronary artery  disease. 4. Lumbar spine spondylosis.   Electronically Signed   By: Kathreen Devoid   On: 10/12/2013 18:58    Review of Systems  Respiratory: Negative for shortness of breath.   Cardiovascular: Negative for chest pain, orthopnea, leg swelling and PND.  Gastrointestinal: Positive for nausea, vomiting and abdominal pain.  Neurological: Positive for weakness. Negative for dizziness and loss of consciousness.  All other systems reviewed and are negative.  Blood pressure 134/67, pulse 45, temperature 98.1 F (36.7 C), temperature source Oral, resp. rate 16, height _0  (1.6 m), weight 178 lb 12.7 oz (81.1 kg), SpO2 95.00%. Physical Exam  Constitutional: She appears well-developed and well-nourished. No distress.  Neck: JVD present.  Cardiovascular: Normal rate, regular rhythm, normal heart sounds and intact distal pulses.  Exam reveals no gallop and no friction rub.   No murmur heard. Respiratory: Effort normal. No respiratory distress. She has no wheezes. She has rales.  Decreased BS with bibasilar rales, L>R that improve with cough   GI: Soft. Bowel sounds are normal. She exhibits no distension. There is tenderness (epigastric and RUQ).  Musculoskeletal: She exhibits no edema.  Skin: Skin is warm and dry. She is not diaphoretic.  Psychiatric: She has a normal  mood and affect. Her behavior is normal.    Assessment/Plan: Principal Problem:   Nausea, vomiting and abdominal pain. Active Problems:   HYPOTHYROIDISM   DM   HYPERCHOLESTEROLEMIA   HYPERTENSION   CAD   COPD   CKD (chronic kidney disease) stage 3, GFR 30-59 ml/min   TIA (transient ischemic attack)   UTI (urinary tract infection)   Nausea and vomiting   Protein-calorie malnutrition, severe  Plan:  1. CAD: LHC in 2012 demonstrated nonobstructive, small caliber CAD. Medical therapy was elected. Since then, she has continued to deny any anginal symptoms. No dyspnea. Her EKG yesterday demonstrated lateral Q waves, not seen on  prior EKGs. ? If accurate. Will order another 12 lead EKG. Will also obtain a CXR. If repeat EKG is ok, then she will be stable from a cardiac standpoint for surgery, if indicated,as she has not had any anginal or HF symptoms. ASA may be held if needed. Recommend continuing her beta blocker. Smoking cessation was advised.    Lyda Jester, PA-C 10/14/2013, 5:33 PM

## 2013-10-14 NOTE — Progress Notes (Signed)
[facility] PROGRESS NOTE  [name] NGE:[id number] DOB: [birth year] DOA: 10/11/2013 PCP: No primary provider on file.  Assessment/Plan: 1. Nausea, vomiting and odynophagia: DD-possibly secondary to antibiotic use fungal esophagitis vs ulcer. Admitted to medical floor . Start on clear liquid diets as tolerated. Supportive/symptomatic treatment with when necessary antiemetics, IV fluids, IV PPI and pain medications. Advance diet as tolerated and monitor. She reports no relief with IV ppi and gi cocktail.  CT ABDO shows gall stones. Will call surgery for evaluate for cholecystectomy.  Gi consulted for possible EGD.  2. Enterococcus UTI: Allergic to penicillin. Has not started levofloxacin-start same. Does not look septic or toxic enough to treat with IV medications. 3. Uncontrolled hypertension: Resume home medications and monitor. 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 Monitor. CBG (last 3)   Recent Labs  10/14/13 0749 10/14/13 1223 10/14/13 1715  GLUCAP 124* 130* 187*   4. Stage III chronic kidney disease: Creatinine looks improved compared to recent hospitalization. 5. Recent TIA: No residual deficits. Continue aspirin. 6. Dyslipidemia: Continue statins 7. Hypothyroid: Continue Synthroid 8. Chronic normocytic anemia: at baseline  9. History of CAD: Asymptomatic of chest pain. 10. History of COPD: Stable. Tobacco cessation counseled. 11. Tobacco abuse: Cessation counseled. Code Status: Full  Family Communication: FAMILY at bedside  Disposition Plan: Return to SNF when medically stable     Consultants:  GI  Surgery  Cardiology.  Procedures: CT abdomen and pelvis.  HIDA SAN PENDING.  Antibiotics:  levaquin  HPI/Subjective: Comfortable and cheerful.   Objective: Filed Vitals:   10/14/13 1348  BP: 134/67  Pulse: 45  Temp: 98.1 F (36.7 C)  Resp: 16    Intake/Output Summary (Last  24 hours) at 10/14/13 1828 Last data filed at 10/14/13 1347  Gross per 24 hour  Intake   1310 ml  Output      0 ml  Net   1310 ml   Filed Weights   10/11/13 1147  Weight: 81.1 kg (178 lb 12.7 oz)    Exam:   General:  Sleeping comfortably  Cardiovascular: s1s2  Respiratory: ctab  Abdomen: soft NT ND BS+  Musculoskeletal: NO pedal edema.   Data Reviewed: Basic Metabolic Panel:  Recent Labs Lab 10/09/13 0428 10/11/13 0400 10/12/13 0532 10/13/13 1030  NA 143 143 143 143  K 4.3 4.9 4.7 4.1  CL 109 105 109 108  CO2 23 24 26 24   GLUCOSE 129* 260* 106* 103*  BUN 34* 20 25* 24*  CREATININE 1.38* 1.14* 1.54* 1.59*  CALCIUM 8.9 9.9 8.8 9.2   Liver Function Tests:  Recent Labs Lab 10/11/13 0400  AST 13  ALT 10  ALKPHOS 73  BILITOT 0.2*  PROT 7.2  ALBUMIN 3.1*    Recent Labs Lab 10/11/13 0400  LIPASE 15   No results found for this basename: AMMONIA,  in the last 168 hours CBC:  Recent Labs Lab 10/09/13 0428 10/10/13 0542 10/11/13 0400 10/12/13 0532 10/13/13 1030  WBC 7.6 11.4* 14.6* 8.8 8.0  NEUTROABS  --   --  11.1*  --   --   HGB 9.2* 10.9* 12.6 9.2* 10.1*  HCT 29.0* 33.9* 39.5 29.7* 31.0*  MCV 92.1 90.6 90.4 92.5 90.6  PLT 183 233 246 188 203   Cardiac Enzymes:  Recent Labs Lab 10/11/13 0400  TROPONINI <0.30   BNP (last 3 results) No results found for this basename: PROBNP,  in the last 8760  hours CBG:  Recent Labs Lab 10/13/13 1655 10/13/13 2111 10/14/13 0749 10/14/13 1223 10/14/13 1715  GLUCAP 203* 164* 124* 130* 187*    No results found for this or any previous visit (from the past 240 hour(s)).   Studies: Ct Abdomen Pelvis Wo Contrast  10/12/2013   CLINICAL DATA:  Right upper quadrant pain, weakness  EXAM: CT ABDOMEN AND PELVIS WITHOUT CONTRAST  TECHNIQUE: Multidetector CT imaging of the abdomen and pelvis was performed following the standard protocol without IV contrast.  COMPARISON:  None.  FINDINGS: The lung bases are  clear. Multivessel coronary artery atherosclerosis.  No renal, ureteral, or bladder calculi. No obstructive uropathy. No perinephric stranding is seen. The kidneys are symmetric in size without evidence for exophytic mass. The bladder is unremarkable.  The liver demonstrates no focal abnormality. There are multiple cholelithiasis. The spleen demonstrates no focal abnormality. The adrenal glands and pancreas are normal.  The stomach, duodenum, small intestine and large intestine are unremarkable. There is a normal caliber appendix in the right lower quadrant without periappendiceal inflammatory changes. There is no pneumoperitoneum, pneumatosis, or portal venous gas. There is no abdominal or pelvic free fluid. There is no lymphadenopathy.  The abdominal aorta is normal in caliber with atherosclerosis.  Degenerative disc disease and degenerative facet arthropathy throughout the lumbar spine. There is a moderate broad-based disc bulge at L5-S1 with severe bilateral facet arthropathy resulting in bilateral foraminal stenosis.  IMPRESSION: 1. Cholelithiasis. 2. No urolithiasis or obstructive uropathy. 3. Multi vessel coronary artery disease. 4. Lumbar spine spondylosis.   Electronically Signed   By: Kathreen Devoid   On: 10/12/2013 18:58    Scheduled Meds: . sodium chloride   Intravenous Once  . amitriptyline  50 mg Oral QHS  . amLODipine  10 mg Oral q morning - 10a  . antiseptic oral rinse  15 mL Mouth Rinse BID  . aspirin  325 mg Oral Daily  . atorvastatin  40 mg Oral q1800  . budesonide-formoterol  2 puff Inhalation BID  . carvedilol  25 mg Oral BID WC  . cloNIDine  0.2 mg Oral BID  . enoxaparin (LOVENOX) injection  40 mg Subcutaneous Daily  . gabapentin  100 mg Oral BID   And  . gabapentin  300 mg Oral QHS  . gi cocktail  30 mL Oral Once  . insulin aspart  0-5 Units Subcutaneous QHS  . insulin aspart  0-9 Units Subcutaneous TID WC  . insulin glargine  25 Units Subcutaneous Daily  . lactose free  nutrition  237 mL Oral TID WC  . levofloxacin  750 mg Oral Q48H  . levothyroxine  50 mcg Oral q morning - 10a  . nystatin  5 mL Oral QID  . pantoprazole (PROTONIX) IV  40 mg Intravenous Q24H  . senna-docusate  2 tablet Oral QHS  . tiotropium  18 mcg Inhalation Daily   Continuous Infusions: . sodium chloride 20 mL/hr at 10/13/13 2005  . lactated ringers 1,000 mL (10/14/13 1018)    Principal Problem:   Nausea, vomiting and abdominal pain. Active Problems:   HYPOTHYROIDISM   DM   HYPERCHOLESTEROLEMIA   HYPERTENSION   CAD   COPD   CKD (chronic kidney disease) stage 3, GFR 30-59 ml/min   TIA (transient ischemic attack)   UTI (urinary tract infection)   Nausea and vomiting   Protein-calorie malnutrition, severe    Time spent: 4min    [facility] Pager [phone]  If 7PM-7AM, please contact  night-coverage at [email or web address], password [facility] 10/14/2013, 6:28 PM  LOS: 3 days

## 2013-10-14 NOTE — Progress Notes (Signed)
PT Cancellation Note  Patient Details Name: [name] MRN: [id number] DOB: 1935/03/29   Cancelled Treatment:    Reason Eval/Treat Not Completed: Patient at procedure or test/unavailable;Other (comment) (just back from endo)   Neil Crouch 10/14/2013, 11:59 AM

## 2013-10-14 NOTE — Op Note (Signed)
[facility] [place], [address]   ENDOSCOPY PROCEDURE REPORT  PATIENT: [name], [name]  MR#: [id number] BIRTHDATE: 1934/11/26 , 78  yrs. old GENDER: Female ENDOSCOPIST:Nimrod Wendt Amedeo Plenty, MD REFERRED BY: PROCEDURE DATE:  10/14/2013 PROCEDURE: ASA CLASS: INDICATIONS:  odynophagia MEDICATION:   37.5 mcg fentanyl, 3 mg Versed TOPICAL ANESTHETIC:  DESCRIPTION OF PROCEDURE:   esophagus: Shallow white-based ulcer at the GE junction with no irregular features no stricture. The remainder of the esophagus looked completely normal, specifically no evidence of candidal esophagitis  Stomach: Normal  Duodenum: Normal     COMPLICATIONS: None  ENDOSCOPIC IMPRESSION:relatively shallow esophageal ulcer unclear whether explaining all the patient's odynophagia symptoms but no other cause seen.  RECOMMENDATIONS:continue proton pump inhibitor and Carafate oral suspension.    _______________________________ Lorrin MaisTeena Irani, MD 10/14/2013 10:58 AM

## 2013-10-15 ENCOUNTER — Observation Stay (HOSPITAL_COMMUNITY): Payer: Medicare Other

## 2013-10-15 ENCOUNTER — Encounter (HOSPITAL_COMMUNITY): Payer: Self-pay | Admitting: Gastroenterology

## 2013-10-15 DIAGNOSIS — K259 Gastric ulcer, unspecified as acute or chronic, without hemorrhage or perforation: Secondary | ICD-10-CM | POA: Diagnosis not present

## 2013-10-15 LAB — GLUCOSE, CAPILLARY
GLUCOSE-CAPILLARY: 216 mg/dL — AB (ref 70–99)
Glucose-Capillary: 205 mg/dL — ABNORMAL HIGH (ref 70–99)
Glucose-Capillary: 200 mg/dL — ABNORMAL HIGH (ref 70–99)
Glucose-Capillary: 263 mg/dL — ABNORMAL HIGH (ref 70–99)
Glucose-Capillary: 317 mg/dL — ABNORMAL HIGH (ref 70–99)

## 2013-10-15 LAB — BASIC METABOLIC PANEL
BUN: 21 mg/dL (ref 6–23)
CALCIUM: 9.4 mg/dL (ref 8.4–10.5)
CO2: 24 meq/L (ref 19–32)
CREATININE: 1.43 mg/dL — AB (ref 0.50–1.10)
Chloride: 107 mEq/L (ref 96–112)
GFR calc Af Amer: 40 mL/min — ABNORMAL LOW (ref >90)
GFR calc non Af Amer: 35 mL/min — ABNORMAL LOW (ref >90)
GLUCOSE: 269 mg/dL — AB (ref 70–99)
Potassium: 4.6 mEq/L (ref 3.7–5.3)
Sodium: 140 mEq/L (ref 137–147)

## 2013-10-15 MED ORDER — PANTOPRAZOLE SODIUM 40 MG PO TBEC
40.0000 mg | DELAYED_RELEASE_TABLET | Freq: Every day | ORAL | Status: DC
  Administered 2013-10-16: 40 mg via ORAL
  Filled 2013-10-15: qty 1

## 2013-10-15 MED ORDER — TECHNETIUM TC 99M MEBROFENIN IV KIT
5.5000 | PACK | Freq: Once | INTRAVENOUS | Status: AC | PRN
  Administered 2013-10-15: 6 via INTRAVENOUS

## 2013-10-15 MED ORDER — SUCRALFATE 1 GM/10ML PO SUSP
1.0000 g | Freq: Four times a day (QID) | ORAL | Status: DC
  Administered 2013-10-15 – 2013-10-16 (×4): 1 g via ORAL
  Filled 2013-10-15 (×7): qty 10

## 2013-10-15 NOTE — Progress Notes (Signed)
1 Day Post-Op  Subjective: Pt just got back for HIDA, had pain with administration of ensure during HIDA EF portion.  Pain improved compared to yesterday.  No further N/V.  C/o pain mostly at epigastrium.  Last BM 5/28.  Objective: Vital signs in last 24 hours: Temp:  [98.1 F (36.7 C)-98.3 F (36.8 C)] 98.3 F (36.8 C) (06/02 0521) Pulse Rate:  [45-53] 49 (06/02 0521) Resp:  [16-18] 18 (06/02 0521) BP: (134-160)/(53-70) 160/53 mmHg (06/02 1116) SpO2:  [93 %-95 %] 94 % (06/02 0521) Last BM Date: 10/10/13  Intake/Output from previous day: 06/01 0701 - 06/02 0700 In: 120 [P.O.:120] Out: -  Intake/Output this shift:    PE: Gen:  Alert, NAD, pleasant Abd: Obese, soft, ND, mild epigastric abdominal pain, +BS, no HSM   Lab Results:   Recent Labs  10/13/13 1030  WBC 8.0  HGB 10.1*  HCT 31.0*  PLT 203   BMET  Recent Labs  10/13/13 1030  NA 143  K 4.1  CL 108  CO2 24  GLUCOSE 103*  BUN 24*  CREATININE 1.59*  CALCIUM 9.2   PT/INR No results found for this basename: LABPROT, INR,  in the last 72 hours CMP     Component Value Date/Time   NA 143 10/13/2013 1030   K 4.1 10/13/2013 1030   CL 108 10/13/2013 1030   CO2 24 10/13/2013 1030   GLUCOSE 103* 10/13/2013 1030   BUN 24* 10/13/2013 1030   CREATININE 1.59* 10/13/2013 1030   CALCIUM 9.2 10/13/2013 1030   PROT 7.2 10/11/2013 0400   ALBUMIN 3.1* 10/11/2013 0400   AST 13 10/11/2013 0400   ALT 10 10/11/2013 0400   ALKPHOS 73 10/11/2013 0400   BILITOT 0.2* 10/11/2013 0400   GFRNONAA 30* 10/13/2013 1030   GFRAA 35* 10/13/2013 1030   Lipase     Component Value Date/Time   LIPASE 15 10/11/2013 0400       Studies/Results: Dg Chest 2 View  10/15/2013   CLINICAL DATA:  Atelectasis.  Abnormal lung exam.  EXAM: CHEST  2 VIEW  COMPARISON:  10/31/2012  FINDINGS: No cardiomegaly. Unremarkable mediastinal contours when accounting for distortion by rightward rotation. No consolidation, edema, effusion, or pneumothorax. Anterior  and posterior cervical spine fixation. Proximal left humerus surgical anchors.  IMPRESSION: No active cardiopulmonary disease.   Electronically Signed   By: Jorje Guild M.D.   On: 10/15/2013 02:59   Nm Hepato W/eject Fract  10/15/2013   CLINICAL DATA:  Upper abdominal pain  EXAM: NUCLEAR MEDICINE HEPATOBILIARY IMAGING WITH GALLBLADDER EF  Views:  Anterior right upper quadrant  Radionuclide:  Technetium 32m Choletec  Dose:  5.5 mCi  Route of administration: Intravenous  COMPARISON:  None.  FINDINGS: Liver uptake of radiotracer is normal. There is prompt visualization of gallbladder and small bowel, indicating patency of the cystic and common bile ducts. The patient consumed 8 oz of Ensure Plus orally with calculation of the computer generated ejection fraction of radiotracer from the gallbladder. The patient did complain abdominal pain with the oral Ensure Plus consumption. The computer generated ejection fraction of radiotracer from the gallbladder is normal at 76.1%, normal greater than 33% using the oral agent.  IMPRESSION: Normal ejection fraction of radiotracer from the gallbladder. Note that the patient did experience pain with the oral Ensure Plus consumption. Cystic and common bile ducts are patent as is evidenced by visualization of gallbladder and small bowel.   Electronically Signed   By: Lowella Grip M.D.  On: 10/15/2013 11:07    Anti-infectives: Anti-infectives   Start     Dose/Rate Route Frequency Ordered Stop   10/14/13 1000  levofloxacin (LEVAQUIN) tablet 750 mg     750 mg Oral Every 48 hours 10/12/13 1729     10/11/13 1000  levofloxacin (LEVAQUIN) tablet 500 mg  Status:  Discontinued     500 mg Oral Daily 10/11/13 0859 10/12/13 1729   10/11/13 0530  cefTRIAXone (ROCEPHIN) 1 g in dextrose 5 % 50 mL IVPB     1 g 100 mL/hr over 30 Minutes Intravenous  Once 10/11/13 0527 10/11/13 0650      Assessment/Plan Upper abdominal pain   Known gall stones Nausea/vomiting   Gastroesophageal ulcer at the GE junction  Odynophagia  UTI  Multiple medical problems including CAD, CKD III, recent TIA, COPD   Plan:  1. HIDA negative and EF very good.  She did have some epigastric pain with administration of ensure.  She still has pain, but is some what improved.   2.  Could either opt to operate on her while she's here or discharge to continue treatment of GE ulcer and follow up as an outpatient to see if chole could be accomplished at a later time if she continues to hurt despite tx for her ulcer.   3. SCD's and on aspirin, would need this held, on lovenox  4. Cleared from cards standpoint for potential surgery 5. Dr. Lucia Gaskins to talk to son [name]) and decide on surgical interventions or not   LOS: 4 days    [name] 10/15/2013, 1:19 PM Pager: [phone]  Agree with above. With normal HIDA, gallstones are probably not symptomatic.  I spoke to son [name] on the phone [phone])  and explained the plan.  Patient okay to go home (or to [facility]) from our standpoint.  We need to see her back in 4 to 6 weeks for follow up. If she has continued symptoms - epigastric pain, vomiting, and dysphagia - then we will need to reconsider gall bladder surgery.  Alphonsa Overall, MD, [facility] Pager: [phone] Office phone:  [phone]

## 2013-10-15 NOTE — Progress Notes (Addendum)
Clinical Social Work Department CLINICAL SOCIAL WORK PLACEMENT NOTE 10/15/2013  Patient:  [name], [name]  Account Number:  [id number] Admit date:  10/11/2013  Clinical Social Worker:  Sindy Messing, LCSW  Date/time:  10/15/2013 04:00 PM  Clinical Social Work is seeking post-discharge placement for this patient at the following level of care:   [place]   (*CSW will update this form in Epic as items are completed)   10/15/2013  Patient/family provided with [facility] Department of Clinical Social Work's list of facilities offering this level of care within the geographic area requested by the patient (or if unable, by the patient's family).  10/15/2013  Patient/family informed of their freedom to choose among providers that offer the needed level of care, that participate in Medicare, Medicaid or managed care program needed by the patient, have an available bed and are willing to accept the patient.  10/15/2013  Patient/family informed of [facility]' ownership interest in [facility], as well as of the fact that they are under no obligation to receive care at this facility.  PASARR submitted to EDS on existing # PASARR number received from EDS on   FL2 transmitted to all facilities in geographic area requested by pt/family on  10/15/2013 FL2 transmitted to all facilities within larger geographic area on   Patient informed that his/her managed care company has contracts with or will negotiate with  certain facilities, including the following:     Patient/family informed of bed offers received:  10/16/13 Patient chooses bed at [facility] Physician recommends and patient chooses bed at    Patient to be transferred to [facility] on  10/16/13 Patient to be transferred to facility by [facility]  The following physician request were entered in Epic:   Additional Comments:

## 2013-10-15 NOTE — Progress Notes (Signed)
Inpatient Diabetes Program Recommendations  AACE/ADA: New Consensus Statement on Inpatient Glycemic Control (2013)  Target Ranges:  Prepandial:   less than 140 mg/dL      Peak postprandial:   less than 180 mg/dL (1-2 hours)      Critically ill patients:  140 - 180 mg/dL   Reason for Visit: Hyperglycemia  Results for KIMLA, FURTH (MRN [id number]) as of 10/15/2013 13:05  Ref. Range 10/14/2013 21:05 10/15/2013 07:49 10/15/2013 11:36  Glucose-Capillary Latest Range: 70-99 mg/dL 205 (H) 200 (H) 263 (H)  Results for DEIDRA, SPEASE (MRN [id number]) as of 10/15/2013 13:05  Ref. Range 10/08/2013 01:05  Hemoglobin A1C Latest Range: <5.7 % 9.9 (H)    Inpatient Diabetes Program Recommendations Insulin - Basal: Increase Lantus to 30 units QAM Insulin - Meal Coverage: .  Note: Will continue to follow. Thank you. Lorenda Peck, RD, LDN, CDE Inpatient Diabetes Coordinator [phone]

## 2013-10-15 NOTE — Progress Notes (Signed)
[facility] PROGRESS NOTE  [name] EGB:[id number] DOB: 1934-06-30 DOA: 10/11/2013 PCP: No primary provider on file. Interim summary:  [name] is a 79 y.o. female 79 year old female, resident of [facility], past medical history of hypertension, type II DM with renal complications, CAD, PAD, stage III chronic kidney disease, COPD, tobacco abuse, bilateral macular degeneration, hypothyroidism, recently hospitalized between 5/25-5/28 for TIA & enterococcus UTI, was discharged from [facility] on 5/28 and returned on 5/29 with complaints of nausea, vomiting and abdominal pain. Patient states that she reached the nursing facility at about 3:30 PM and within half an hour she started experiencing epigastric abdominal pain associated with nausea and vomiting. She describes the abdominal pain is sharp, intermittent, at times radiating to the right upper quadrant, no clear precipitating or relieving factors. This was associated with 3 episodes of nonbloody emesis which she describes as dark green/brown in color. She also reported odynophagia. She was started on IV protonix and nystatin for possible fungal esophagitis. Called Gi consult and she underwent EGD, was found to have a shallow esophageal ulcer. Since she was complaining about RUQ pain, got CT abd and pelvis ,w as found to have gall stones. Called surgery to see if she needs a cholecystectomy for symptomatic gall stones. Surgery ordere HIDA scan, which was negative.   Assessment/Plan: 1. Nausea, vomiting and odynophagia: DD-possibly secondary to antibiotic use fungal esophagitis vs ulcer. Admitted to medical floor . Start on clear liquid diets and advanced as tolerated. . Supportive/symptomatic treatment with when necessary antiemetics, IV fluids, IV PPI and pain medications. Her nausea and vomiting has almost resolved.  2. Enterococcus UTI: Allergic to penicillin. Has not started levofloxacin-start same. Does not look septic  or toxic enough to treat with IV medications. 3. Uncontrolled hypertension: Resume home medications and monitor. 4. Uncontrolled type II DM with renal complications: continue with Lantus and NovoLog sensitive SSI. Monitor.  5. Stage III chronic kidney disease: Creatinine looks improved compared to recent hospitalization. 6. Recent TIA: No residual deficits. Continue aspirin. 7. Dyslipidemia: Continue statins 8. Hypothyroid: Continue Synthroid 9. Chronic normocytic anemia: at baseline  10. History of CAD: Asymptomatic of chest pain. 11. History of COPD: Stable. Tobacco cessation counseled. 12. Tobacco abuse: Cessation counseled. 13. DVT prophylaxis.    Code Status: Full  Family Communication: none at bedside  Disposition Plan: Return to SNF when medically stable     Consultants:  GI  Surgery  Cardiology.  Procedures: CT abdomen and pelvis.  HIDA Scan pending.   Antibiotics:  levaquin 5/29  HPI/Subjective: Comfortable and cheerful. Does not want to go to [facility].   Objective: Filed Vitals:   10/15/13 1116  BP: 160/53  Pulse:   Temp:   Resp:     Intake/Output Summary (Last 24 hours) at 10/15/13 1151 Last data filed at 10/15/13 0800  Gross per 24 hour  Intake    120 ml  Output      0 ml  Net    120 ml   Filed Weights   10/11/13 1147  Weight: 81.1 kg (178 lb 12.7 oz)    Exam:   General:  Alert afebrile comfortable.   Cardiovascular: s1s2  Respiratory: ctab  Abdomen: soft NT ND BS+  Musculoskeletal: NO pedal edema.   Data Reviewed: Basic Metabolic Panel:  Recent Labs Lab 10/09/13 0428 10/11/13 0400 10/12/13 0532 10/13/13 1030  NA 143 143 143 143  K 4.3 4.9 4.7 4.1  CL 109 105 109 108  CO2 23  24 26 24   GLUCOSE 129* 260* 106* 103*  BUN 34* 20 25* 24*  CREATININE 1.38* 1.14* 1.54* 1.59*  CALCIUM 8.9 9.9 8.8 9.2   Liver Function Tests:  Recent Labs Lab 10/11/13 0400  AST 13  ALT 10  ALKPHOS 73  BILITOT 0.2*  PROT 7.2   ALBUMIN 3.1*    Recent Labs Lab 10/11/13 0400  LIPASE 15   No results found for this basename: AMMONIA,  in the last 168 hours CBC:  Recent Labs Lab 10/09/13 0428 10/10/13 0542 10/11/13 0400 10/12/13 0532 10/13/13 1030  WBC 7.6 11.4* 14.6* 8.8 8.0  NEUTROABS  --   --  11.1*  --   --   HGB 9.2* 10.9* 12.6 9.2* 10.1*  HCT 29.0* 33.9* 39.5 29.7* 31.0*  MCV 92.1 90.6 90.4 92.5 90.6  PLT 183 233 246 188 203   Cardiac Enzymes:  Recent Labs Lab 10/11/13 0400  TROPONINI <0.30   BNP (last 3 results) No results found for this basename: PROBNP,  in the last 8760 hours CBG:  Recent Labs Lab 10/14/13 1223 10/14/13 1715 10/14/13 2105 10/15/13 0749 10/15/13 1136  GLUCAP 130* 187* 205* 200* 263*    No results found for this or any previous visit (from the past 240 hour(s)).   Studies: Dg Chest 2 View  10/15/2013   CLINICAL DATA:  Atelectasis.  Abnormal lung exam.  EXAM: CHEST  2 VIEW  COMPARISON:  10/31/2012  FINDINGS: No cardiomegaly. Unremarkable mediastinal contours when accounting for distortion by rightward rotation. No consolidation, edema, effusion, or pneumothorax. Anterior and posterior cervical spine fixation. Proximal left humerus surgical anchors.  IMPRESSION: No active cardiopulmonary disease.   Electronically Signed   By: Jorje Guild M.D.   On: 10/15/2013 02:59   Nm Hepato W/eject Fract  10/15/2013   CLINICAL DATA:  Upper abdominal pain  EXAM: NUCLEAR MEDICINE HEPATOBILIARY IMAGING WITH GALLBLADDER EF  Views:  Anterior right upper quadrant  Radionuclide:  Technetium 76m Choletec  Dose:  5.5 mCi  Route of administration: Intravenous  COMPARISON:  None.  FINDINGS: Liver uptake of radiotracer is normal. There is prompt visualization of gallbladder and small bowel, indicating patency of the cystic and common bile ducts. The patient consumed 8 oz of Ensure Plus orally with calculation of the computer generated ejection fraction of radiotracer from the gallbladder.  The patient did complain abdominal pain with the oral Ensure Plus consumption. The computer generated ejection fraction of radiotracer from the gallbladder is normal at 76.1%, normal greater than 33% using the oral agent.  IMPRESSION: Normal ejection fraction of radiotracer from the gallbladder. Note that the patient did experience pain with the oral Ensure Plus consumption. Cystic and common bile ducts are patent as is evidenced by visualization of gallbladder and small bowel.   Electronically Signed   By: Lowella Grip M.D.   On: 10/15/2013 11:07    Scheduled Meds: . sodium chloride   Intravenous Once  . amitriptyline  50 mg Oral QHS  . amLODipine  10 mg Oral q morning - 10a  . antiseptic oral rinse  15 mL Mouth Rinse BID  . aspirin  325 mg Oral Daily  . atorvastatin  40 mg Oral q1800  . budesonide-formoterol  2 puff Inhalation BID  . carvedilol  25 mg Oral BID WC  . cloNIDine  0.2 mg Oral BID  . enoxaparin (LOVENOX) injection  40 mg Subcutaneous Daily  . gabapentin  100 mg Oral BID   And  . gabapentin  300 mg Oral QHS  . gi cocktail  30 mL Oral Once  . insulin aspart  0-5 Units Subcutaneous QHS  . insulin aspart  0-9 Units Subcutaneous TID WC  . insulin glargine  25 Units Subcutaneous Daily  . lactose free nutrition  237 mL Oral TID WC  . levofloxacin  750 mg Oral Q48H  . levothyroxine  50 mcg Oral q morning - 10a  . pantoprazole (PROTONIX) IV  40 mg Intravenous Q24H  . senna-docusate  2 tablet Oral QHS  . tiotropium  18 mcg Inhalation Daily   Continuous Infusions: . sodium chloride 20 mL/hr at 10/13/13 2005  . lactated ringers 1,000 mL (10/14/13 1018)    Principal Problem:   Nausea, vomiting and abdominal pain. Active Problems:   HYPOTHYROIDISM   DM   HYPERCHOLESTEROLEMIA   HYPERTENSION   CAD   COPD   CKD (chronic kidney disease) stage 3, GFR 30-59 ml/min   TIA (transient ischemic attack)   UTI (urinary tract infection)   Nausea and vomiting   Protein-calorie  malnutrition, severe    Time spent: 34min    Hosie Poisson  [facility] Pager [phone]  If 7PM-7AM, please contact night-coverage at [email or web address], password [facility] 10/15/2013, 11:51 AM  LOS: 4 days

## 2013-10-15 NOTE — Progress Notes (Signed)
Clinical Social Work  Per Therapist, sports, patient had questions about SNF placement. CSW met with patient at bedside who reports she now wants placement at [facility]. CSW explained CSW would contact SNF and let patient know if they are able to accept. CSW spoke with liaison from SNF who will review information and let CSW know if they can accept. CSW will continue to follow.  [place], [place] [phone]

## 2013-10-15 NOTE — Progress Notes (Signed)
Clinical Social Work   Patient was discussed during progression meeting. CSW spoke with [facility] and updated SNF on patient's DC plans. SNF remains agreeable to accept patient at DC. CSW will continue to follow to assist with DC planning.  [place], [place] [phone]

## 2013-10-16 DIAGNOSIS — N183 Chronic kidney disease, stage 3 unspecified: Secondary | ICD-10-CM

## 2013-10-16 DIAGNOSIS — E039 Hypothyroidism, unspecified: Secondary | ICD-10-CM

## 2013-10-16 DIAGNOSIS — D649 Anemia, unspecified: Secondary | ICD-10-CM

## 2013-10-16 DIAGNOSIS — J449 Chronic obstructive pulmonary disease, unspecified: Secondary | ICD-10-CM

## 2013-10-16 DIAGNOSIS — K259 Gastric ulcer, unspecified as acute or chronic, without hemorrhage or perforation: Secondary | ICD-10-CM | POA: Diagnosis not present

## 2013-10-16 DIAGNOSIS — L97409 Non-pressure chronic ulcer of unspecified heel and midfoot with unspecified severity: Secondary | ICD-10-CM

## 2013-10-16 DIAGNOSIS — R1310 Dysphagia, unspecified: Secondary | ICD-10-CM

## 2013-10-16 DIAGNOSIS — K8020 Calculus of gallbladder without cholecystitis without obstruction: Secondary | ICD-10-CM

## 2013-10-16 DIAGNOSIS — E43 Unspecified severe protein-calorie malnutrition: Secondary | ICD-10-CM

## 2013-10-16 DIAGNOSIS — W19XXXA Unspecified fall, initial encounter: Secondary | ICD-10-CM

## 2013-10-16 LAB — GLUCOSE, CAPILLARY
Glucose-Capillary: 223 mg/dL — ABNORMAL HIGH (ref 70–99)
Glucose-Capillary: 275 mg/dL — ABNORMAL HIGH (ref 70–99)

## 2013-10-16 MED ORDER — SUCRALFATE 1 GM/10ML PO SUSP: 1.0000 g | Freq: Four times a day (QID) | ORAL | Status: DC

## 2013-10-16 MED ORDER — BOOST PLUS PO LIQD: 237.0000 mL | Freq: Three times a day (TID) | ORAL | Status: DC

## 2013-10-16 MED ORDER — PANTOPRAZOLE SODIUM 40 MG PO TBEC: 40.0000 mg | DELAYED_RELEASE_TABLET | Freq: Every day | ORAL | Status: DC

## 2013-10-16 MED ORDER — MINERAL OIL RE ENEM
1.0000 | ENEMA | Freq: Once | RECTAL | Status: DC
  Filled 2013-10-16: qty 1

## 2013-10-16 MED ORDER — INSULIN GLARGINE 100 UNIT/ML ~~LOC~~ SOLN: 30.0000 [IU] | Freq: Every day | SUBCUTANEOUS | Status: DC

## 2013-10-16 MED ORDER — ALPRAZOLAM 0.25 MG PO TABS
0.2500 mg | ORAL_TABLET | Freq: Two times a day (BID) | ORAL | Status: DC | PRN
Start: 2013-10-16 — End: 2014-08-13

## 2013-10-16 MED ORDER — BIOTENE DRY MOUTH MT LIQD: 15.0000 mL | Freq: Two times a day (BID) | OROMUCOSAL | Status: DC

## 2013-10-16 NOTE — Progress Notes (Addendum)
[facility] Progress Note  Subjective: Patient still complaining of some epigastric to right upper quadrant abdominal pain. The odynophagia complaints have seemed to improve. Tolerating some diet. Objective: Vital signs in last 24 hours: Temp:  [97.3 F (36.3 C)-98.4 F (36.9 C)] 98.4 F (36.9 C) (06/03 1009) Pulse Rate:  [59-76] 62 (06/03 1009) Resp:  [17-18] 17 (06/03 1009) BP: (131-154)/(70-79) 147/79 mmHg (06/03 1009) SpO2:  [91 %-99 %] 99 % (06/03 1009) Weight:  [83.144 kg (183 lb 4.8 oz)] 83.144 kg (183 lb 4.8 oz) (06/03 1009) Weight change:    PE: Abdomen soft mildly tender in the epigastrium  Lab Results: Results for orders placed during the hospital encounter of 10/11/13 (from the past 24 hour(s))  BASIC METABOLIC PANEL     Status: Abnormal   Collection Time    10/15/13  1:03 PM      Result Value Ref Range   Sodium 140  137 - 147 mEq/L   Potassium 4.6  3.7 - 5.3 mEq/L   Chloride 107  96 - 112 mEq/L   CO2 24  19 - 32 mEq/L   Glucose, Bld 269 (*) 70 - 99 mg/dL   BUN 21  6 - 23 mg/dL   Creatinine, Ser 1.43 (*) 0.50 - 1.10 mg/dL   Calcium 9.4  8.4 - 10.5 mg/dL   GFR calc non Af Amer 35 (*) >90 mL/min   GFR calc Af Amer 40 (*) >90 mL/min  GLUCOSE, CAPILLARY     Status: Abnormal   Collection Time    10/15/13  5:10 PM      Result Value Ref Range   Glucose-Capillary 317 (*) 70 - 99 mg/dL  GLUCOSE, CAPILLARY     Status: Abnormal   Collection Time    10/15/13 10:16 PM      Result Value Ref Range   Glucose-Capillary 216 (*) 70 - 99 mg/dL   Comment 1 Notify RN    GLUCOSE, CAPILLARY     Status: Abnormal   Collection Time    10/16/13  7:50 AM      Result Value Ref Range   Glucose-Capillary 223 (*) 70 - 99 mg/dL   Comment 1 Notify RN      Studies/Results: Dg Chest 2 View  10/15/2013   CLINICAL DATA:  Atelectasis.  Abnormal lung exam.  EXAM: CHEST  2 VIEW  COMPARISON:  10/31/2012  FINDINGS: No cardiomegaly. Unremarkable mediastinal contours when accounting  for distortion by rightward rotation. No consolidation, edema, effusion, or pneumothorax. Anterior and posterior cervical spine fixation. Proximal left humerus surgical anchors.  IMPRESSION: No active cardiopulmonary disease.   Electronically Signed   By: Jorje Guild M.D.   On: 10/15/2013 02:59   Nm Hepato W/eject Fract  10/15/2013   CLINICAL DATA:  Upper abdominal pain  EXAM: NUCLEAR MEDICINE HEPATOBILIARY IMAGING WITH GALLBLADDER EF  Views:  Anterior right upper quadrant  Radionuclide:  Technetium 61m Choletec  Dose:  5.5 mCi  Route of administration: Intravenous  COMPARISON:  None.  FINDINGS: Liver uptake of radiotracer is normal. There is prompt visualization of gallbladder and small bowel, indicating patency of the cystic and common bile ducts. The patient consumed 8 oz of Ensure Plus orally with calculation of the computer generated ejection fraction of radiotracer from the gallbladder. The patient did complain abdominal pain with the oral Ensure Plus consumption. The computer generated ejection fraction of radiotracer from the gallbladder is normal at 76.1%, normal greater than 33% using the oral agent.  IMPRESSION: Normal  ejection fraction of radiotracer from the gallbladder. Note that the patient did experience pain with the oral Ensure Plus consumption. Cystic and common bile ducts are patent as is evidenced by visualization of gallbladder and small bowel.   Electronically Signed   By: Lowella Grip M.D.   On: 10/15/2013 11:07      Assessment: 1. Initial complaints of odynophagia/dysphagia with small distal esophageal ulcer seen on EGD without any other significant findings 2. Epigastric pain with gallstones but negative HIDA scan  Plan: 1. Continue proton pump inhibitor and advance diet as tolerated. Surgery following for  possible symptomatic biliary colic, cholecystectomy not recommended at present   Missy Sabins 10/16/2013, 12:00 PM

## 2013-10-16 NOTE — Discharge Instructions (Signed)
Cholelithiasis °Cholelithiasis (also called gallstones) is a form of gallbladder disease in which gallstones form in your gallbladder. The gallbladder is an organ that stores bile made in the liver, which helps digest fats. Gallstones begin as small crystals and slowly grow into stones. Gallstone pain occurs when the gallbladder spasms and a gallstone is blocking the duct. Pain can also occur when a stone passes out of the duct.  °RISK FACTORS °· Being female.   °· Having multiple pregnancies. Health care providers sometimes advise removing diseased gallbladders before future pregnancies.   °· Being obese. °· Eating a diet heavy in fried foods and fat.   °· Being older than 60 years and increasing age.   °· Prolonged use of medicines containing female hormones.   °· Having diabetes mellitus.   °· Rapidly losing weight.   °· Having a family history of gallstones (heredity).   °SYMPTOMS °· Nausea.   °· Vomiting. °· Abdominal pain.   °· Yellowing of the skin (jaundice).   °· Sudden pain. It may persist from several minutes to several hours. °· Fever.   °· Tenderness to the touch.  °In some cases, when gallstones do not move into the bile duct, people have no pain or symptoms. These are called "silent" gallstones.  °TREATMENT °Silent gallstones do not need treatment. In severe cases, emergency surgery may be required. Options for treatment include: °· Surgery to remove the gallbladder. This is the most common treatment. °· Medicines. These do not always work and may take 6 12 months or more to work. °· Shock wave treatment (extracorporeal biliary lithotripsy). In this treatment an ultrasound machine sends shock waves to the gallbladder to break gallstones into smaller pieces that can pass into the intestines or be dissolved by medicine. °HOME CARE INSTRUCTIONS  °· Only take over-the-counter or prescription medicines for pain, discomfort, or fever as directed by your health care provider.   °· Follow a low-fat diet until  seen again by your health care provider. Fat causes the gallbladder to contract, which can result in pain.   °· Follow up with your health care provider as directed. Attacks are almost always recurrent and surgery is usually required for permanent treatment.   °SEEK IMMEDIATE MEDICAL CARE IF:  °· Your pain increases and is not controlled by medicines.   °· You have a fever or persistent symptoms for more than 2 3 days.   °· You have a fever and your symptoms suddenly get worse.   °· You have persistent nausea and vomiting.   °MAKE SURE YOU:  °· Understand these instructions. °· Will watch your condition. °· Will get help right away if you are not doing well or get worse. °Document Released: 04/28/2005 Document Revised: 01/02/2013 Document Reviewed: 10/24/2012 °[employer]® Patient Information ©2014 [employer]. ° °

## 2013-10-16 NOTE — Progress Notes (Signed)
Patient discharge to SNF, alert and oriented, discharge instructions given (package) for delivery to Nursing Home, transported by Medical transportation, patient in stable condition at this time

## 2013-10-16 NOTE — Progress Notes (Signed)
Clinical Social Work  CSW spoke with patient who understands [facility] does not accept her insurance and is agreeable to return to [facility]. CSW informed patient and son of DC plans and all parties agreeable. Son reports that he or family will go to SNF around 1:30-2pm to complete paperwork. Patient and family agreeable for [facility] to transport to SNF. CSW faxed DC summary to [facility] who is agreeable to accept after 2 pm. CSW prepared DC packet with FL2, hard scripts, and DC summary included. CSW coordinated transportation via [facility] for 2pm. Request #: [id number].  CSW is signing off but available if needed.  [place], [place] [phone]

## 2013-10-16 NOTE — Progress Notes (Signed)
2 Days Post-Op  Subjective: Pt feels better.  No N/V.  Tolerating soft diet.  Pain improved.  Last BM 5/28.  Objective: Vital signs in last 24 hours: Temp:  [97.3 F (36.3 C)-98.4 F (36.9 C)] 98.4 F (36.9 C) (06/03 0519) Pulse Rate:  [59-76] 60 (06/03 0519) Resp:  [18] 18 (06/03 0519) BP: (131-160)/(53-73) 131/70 mmHg (06/03 0519) SpO2:  [91 %-96 %] 96 % (06/03 0955) Weight:  [183 lb 4.8 oz (83.144 kg)] 183 lb 4.8 oz (83.144 kg) (06/03 1009) Last BM Date: 10/10/13  Intake/Output from previous day: 06/02 0701 - 06/03 0700 In: 560 [P.O.:560] Out: -  Intake/Output this shift: Total I/O In: 240 [P.O.:240] Out: -   PE: Gen: Alert, NAD, pleasant  Abd: Obese, soft, ND, minimal epigastric abdominal pain, +BS, no HSM   Lab Results:  No results found for this basename: WBC, HGB, HCT, PLT,  in the last 72 hours BMET  Recent Labs  10/15/13 1303  NA 140  K 4.6  CL 107  CO2 24  GLUCOSE 269*  BUN 21  CREATININE 1.43*  CALCIUM 9.4   PT/INR No results found for this basename: LABPROT, INR,  in the last 72 hours CMP     Component Value Date/Time   NA 140 10/15/2013 1303   K 4.6 10/15/2013 1303   CL 107 10/15/2013 1303   CO2 24 10/15/2013 1303   GLUCOSE 269* 10/15/2013 1303   BUN 21 10/15/2013 1303   CREATININE 1.43* 10/15/2013 1303   CALCIUM 9.4 10/15/2013 1303   PROT 7.2 10/11/2013 0400   ALBUMIN 3.1* 10/11/2013 0400   AST 13 10/11/2013 0400   ALT 10 10/11/2013 0400   ALKPHOS 73 10/11/2013 0400   BILITOT 0.2* 10/11/2013 0400   GFRNONAA 35* 10/15/2013 1303   GFRAA 40* 10/15/2013 1303   Lipase     Component Value Date/Time   LIPASE 15 10/11/2013 0400       Studies/Results: Dg Chest 2 View  10/15/2013   CLINICAL DATA:  Atelectasis.  Abnormal lung exam.  EXAM: CHEST  2 VIEW  COMPARISON:  10/31/2012  FINDINGS: No cardiomegaly. Unremarkable mediastinal contours when accounting for distortion by rightward rotation. No consolidation, edema, effusion, or pneumothorax. Anterior and  posterior cervical spine fixation. Proximal left humerus surgical anchors.  IMPRESSION: No active cardiopulmonary disease.   Electronically Signed   By: Jorje Guild M.D.   On: 10/15/2013 02:59   Nm Hepato W/eject Fract  10/15/2013   CLINICAL DATA:  Upper abdominal pain  EXAM: NUCLEAR MEDICINE HEPATOBILIARY IMAGING WITH GALLBLADDER EF  Views:  Anterior right upper quadrant  Radionuclide:  Technetium 3m Choletec  Dose:  5.5 mCi  Route of administration: Intravenous  COMPARISON:  None.  FINDINGS: Liver uptake of radiotracer is normal. There is prompt visualization of gallbladder and small bowel, indicating patency of the cystic and common bile ducts. The patient consumed 8 oz of Ensure Plus orally with calculation of the computer generated ejection fraction of radiotracer from the gallbladder. The patient did complain abdominal pain with the oral Ensure Plus consumption. The computer generated ejection fraction of radiotracer from the gallbladder is normal at 76.1%, normal greater than 33% using the oral agent.  IMPRESSION: Normal ejection fraction of radiotracer from the gallbladder. Note that the patient did experience pain with the oral Ensure Plus consumption. Cystic and common bile ducts are patent as is evidenced by visualization of gallbladder and small bowel.   Electronically Signed   By: Lowella Grip M.D.  On: 10/15/2013 11:07    Anti-infectives: Anti-infectives   Start     Dose/Rate Route Frequency Ordered Stop   10/14/13 1000  levofloxacin (LEVAQUIN) tablet 750 mg     750 mg Oral Every 48 hours 10/12/13 1729     10/11/13 1000  levofloxacin (LEVAQUIN) tablet 500 mg  Status:  Discontinued     500 mg Oral Daily 10/11/13 0859 10/12/13 1729   10/11/13 0530  cefTRIAXone (ROCEPHIN) 1 g in dextrose 5 % 50 mL IVPB     1 g 100 mL/hr over 30 Minutes Intravenous  Once 10/11/13 0527 10/11/13 0650       Assessment/Plan Upper abdominal pain  Cholelithiasis on CT and  ultrasound Nausea/vomiting  Gastroesophageal ulcer at the GE junction  Odynophagia  UTI  Multiple medical problems including CAD, CKD III, recent TIA, COPD   Plan:  1. HIDA negative and EF very good. Only has cholelithiasis.  She did have some epigastric pain with administration of ensure. She still has pain, but mostly resolved. 2. After discussion with family and the patient she would like to wait on surgery for now and follow up with [place] in the office in 4-6 weeks.  No antibiotics needed at discharge from our perspective.  Avoid high fat diet, greasy, or fried foods. 3. SCD's and on aspirin, and on lovenox  4. Last BM on 5/28 - may need enema 5. D/c home when medically stable per medicine   LOS: 5 days    Coralie Keens 10/16/2013, 10:34 AM Pager: [phone]  Agree with above. We can see patient back in 4 to 6 weeks to see how she is doing.  Alphonsa Overall, MD, [facility] Pager: [phone] Office phone:  [phone]

## 2013-10-16 NOTE — Discharge Summary (Signed)
Physician Discharge Summary  [name] CXK:[id number] DOB: 07-Aug-1934 DOA: 10/11/2013  PCP: No primary provider on file.  Admit date: 10/11/2013 Discharge date: 10/16/2013  Time spent: 30 minutes  Recommendations for Outpatient Follow-up:  Patient will be discharged to [facility]. Patient is to continue her medications as prescribed. She should follow up with surgery within 4-6 weeks, she should also follow up with her primary care physician within one to 2 weeks of discharge. Patient should follow up with gastroenterology as needed. Patient should follow a soft/bland diet.  Discharge Diagnoses:  Principal Problem:   Nausea, vomiting and abdominal pain. Active Problems:   HYPOTHYROIDISM   DM   HYPERCHOLESTEROLEMIA   HYPERTENSION   CAD   COPD   CKD (chronic kidney disease) stage 3, GFR 30-59 ml/min   TIA (transient ischemic attack)   UTI (urinary tract infection)   Nausea and vomiting   Protein-calorie malnutrition, severe   Discharge Condition: Stable  Diet recommendation: Soft/Bland Diet  Filed Weights   10/11/13 1147 10/16/13 1009  Weight: 81.1 kg (178 lb 12.7 oz) 83.144 kg (183 lb 4.8 oz)    History of present illness:  [name] is a 79 y.o. female resident of [facility], past medical history of hypertension, type II DM with renal complications, CAD, PAD, stage III chronic kidney disease, COPD, tobacco abuse, bilateral macular degeneration, hypothyroidism, recently hospitalized between 5/25-5/28 for TIA & enterococcus UTI, was discharged from [facility] on 5/28 and returned on 5/29 with complaints of nausea, vomiting and abdominal pain. Patient stated that she reached the nursing facility at about 3:30 PM and within half an hour she started experiencing epigastric abdominal pain associated with nausea and vomiting. She described the abdominal pain is sharp, intermittent, at times radiating to the right upper quadrant, no clear precipitating or  relieving factors. This was associated with 3 episodes of nonbloody emesis which she describes as dark green/brown in color. The emesis occurred between 4 and 6 PM and she states that she has not vomited since. She gave a  history of PUD, remotely diagnosed by EGD. She denied history of NSAID use. Denied reflux symptoms. No reported fever or chills. No sickly contacts with similar symptoms or history of eating anything unusual. She continued to complain of dysuria. She was treated in the hospital with 4 days of IV Rocephin but has not started oral levofloxacin. In the ED, afebrile, mild leukocytosis and UA suggestive of UTI. Ultrasound abdomen showed cholelithiasis. Lipase wass normal. Hospitalist admission requested.  Hospital Course:  Upper abdominal pain/Nausea/Vomiting/Odynophagia secondary to Ulcer -GI consulted and EGD revealed shallow ulcer at the GE junction with no obvious stricture or, treat with PPI and Carafate -Nausea and vomiting have resolved -Patient currently tolerating a bland diet -Patient underwent a HIDA scan which was negative and showed good EF -Patient does have cholelithiasis without cholecystitis, patient should follow up with surgery in 4-6 weeks, no antibiotics needed at this time -Continue bland diet, avoid high fat diet greasy or fried food  UTI -Was enterococcus in Feb 2015, no culture done at this admission -Patient has allergy to penicillin -Was noted to have UTI at last hospital admission, had not started levofloxacin as an outpatient -Has been on ceftriaxone and levaquin (received 7 days of antibiotics)  Hypertension -Continue amlodipine, Coreg, clonidine  Type 2 diabetes mellitus with neuropathy -Continue Lantus and -Continue gabapentin for neuropathy  Chronic kidney disease, stage III -Creatinine appears to be at baseline  Recent TIA -No focal  deficits, continue aspirin  Dyslipidemia  -Continue statin  Hypothyroidism -Continue  Synthroid  Chronic normocytic anemia -Appears to be a baseline  History of coronary artery disease -Patient is chest pain-free at this time, continue aspirin  History of COPD -Currently stable, patient was counseled on tobacco cessation -Continue Spiriva, albuterol nebulizer, Symbicort  Tobacco abuse -Counseling given  Anxiety -Continue Xanax  Procedures: HIDA scan  Consultations: Surgery Gastroenterology Cardiology  Discharge Exam: Filed Vitals:   10/16/13 0519  BP: 131/70  Pulse: 60  Temp: 98.4 F (36.9 C)  Resp: 18   Exam  General: Well developed, well nourished, NAD, appears stated age  HEENT: NCAT, PERRLA, EOMI, Anicteic Sclera, mucous membranes moist.   Neck: Supple, no JVD, no masses  Cardiovascular: S1 S2 auscultated, no rubs, murmurs or gallops. Regular rate and rhythm.  Respiratory: Clear to auscultation bilaterally with equal chest rise  Abdomen: Soft, diffusely tender, nondistended, + bowel sounds  Extremities: warm dry without cyanosis clubbing or edema, left heel bandage, right heel eschar  Neuro: AAOx3, cranial nerves grossly intact. Strength equal and bilateral in the upper and lower extremities  Skin: Without rashes exudates or nodules  Psych: Normal affect and demeanor with intact judgement and insight  Discharge Instructions      Discharge Instructions   Discharge instructions    Complete by:  As directed   Patient will be discharged to [facility]. Patient is to continue her medications as prescribed. She should follow up with surgery within 4-6 weeks, she should also follow up with her primary care physician within one to 2 weeks of discharge. Patient should follow up with gastroenterology as needed. Patient should follow a soft/bland diet.            Medication List    STOP taking these medications       guaiFENesin 100 MG/5ML Soln  Commonly known as:  ROBITUSSIN     HYDROcodone-acetaminophen 5-325 MG per tablet   Commonly known as:  NORCO/VICODIN     levofloxacin 500 MG tablet  Commonly known as:  LEVAQUIN      TAKE these medications       acetaminophen 500 MG tablet  Commonly known as:  TYLENOL  Take 500 mg by mouth every 4 (four) hours as needed for mild pain, fever or headache.     albuterol (2.5 MG/3ML) 0.083% nebulizer solution  Commonly known as:  PROVENTIL  Take 2.5 mg by nebulization every 6 (six) hours as needed for wheezing.     ALPRAZolam 0.25 MG tablet  Commonly known as:  XANAX  Take 1 tablet (0.25 mg total) by mouth 2 (two) times daily as needed for anxiety.     amitriptyline 50 MG tablet  Commonly known as:  ELAVIL  Take 50 mg by mouth at bedtime.     amLODipine 10 MG tablet  Commonly known as:  NORVASC  Take 10 mg by mouth every morning.     antiseptic oral rinse Liqd  15 mLs by Mouth Rinse route 2 (two) times daily.     aspirin 325 MG tablet  Take 1 tablet (325 mg total) by mouth daily.     budesonide-formoterol 160-4.5 MCG/ACT inhaler  Commonly known as:  SYMBICORT  Inhale 2 puffs into the lungs 2 (two) times daily.     carvedilol 25 MG tablet  Commonly known as:  COREG  Take 25 mg by mouth 2 (two) times daily with a meal. 0800 and 1700     cloNIDine 0.2  MG tablet  Commonly known as:  CATAPRES  Take 0.2 mg by mouth 2 (two) times daily. Hold if blood pressure less than 100/60     furosemide 40 MG tablet  Commonly known as:  LASIX  Take 40 mg by mouth daily.     gabapentin 100 MG capsule  Commonly known as:  NEURONTIN  Take 100-300 mg by mouth 3 (three) times daily. 1 capsule at 0800, 1 capsule at 1400, and 3 capsules at 2000.     HEALTHY EYES Tabs  Take 1 tablet by mouth every morning.     insulin glargine 100 UNIT/ML injection  Commonly known as:  LANTUS  Inject 0.3 mLs (30 Units total) into the skin at bedtime.     insulin lispro 100 UNIT/ML injection  Commonly known as:  HUMALOG  Inject 0-14 Units into the skin 3 (three) times daily before  meals. CBG greater than 350=14 units     lactose free nutrition Liqd  Take 237 mLs by mouth 3 (three) times daily with meals.     levothyroxine 50 MCG tablet  Commonly known as:  SYNTHROID, LEVOTHROID  Take 50 mcg by mouth every morning.     pantoprazole 40 MG tablet  Commonly known as:  PROTONIX  Take 1 tablet (40 mg total) by mouth daily.     polyethylene glycol packet  Commonly known as:  MIRALAX / GLYCOLAX  Take 17 g by mouth daily as needed for mild constipation, moderate constipation or severe constipation.     promethazine 25 MG tablet  Commonly known as:  PHENERGAN  Take 25 mg by mouth every 6 (six) hours as needed for nausea or vomiting.     rosuvastatin 20 MG tablet  Commonly known as:  CRESTOR  Take 20 mg by mouth every evening.     senna-docusate 8.6-50 MG per tablet  Commonly known as:  Senokot-S  Take 2 tablets by mouth at bedtime.     sucralfate 1 GM/10ML suspension  Commonly known as:  CARAFATE  Take 10 mLs (1 g total) by mouth every 6 (six) hours.     tiotropium 18 MCG inhalation capsule  Commonly known as:  SPIRIVA  Place 18 mcg into inhaler and inhale daily.       Allergies  Allergen Reactions  . Codeine Other (See Comments)    unknown  . Lisinopril Other (See Comments)    unknown  . Penicillins Hives  . Septra [Sulfamethoxazole-Trimethoprim] Hives and Rash  . Sulfur Hives and Rash    Break out    Follow-up Information   Follow up with NEWMAN,DAVID H, MD. Schedule an appointment as soon as possible for a visit in 4 weeks. (For post-hospital follow up in 4-6 weeks)    Specialty:  General Surgery   Contact information:   [place] [address] [phone]       Follow up with Primary Care Physician. Schedule an appointment as soon as possible for a visit in 1 week. [facility] Follow up)       Follow up with HAYES,JOHN C, MD. (As needed)    Specialty:  Gastroenterology   Contact information:   [address]  [place] [address] [phone]        The results of significant diagnostics from this hospitalization (including imaging, microbiology, ancillary and laboratory) are listed below for reference.    Significant Diagnostic Studies: Ct Abdomen Pelvis Wo Contrast  10/12/2013   CLINICAL DATA:  Right upper quadrant pain, weakness  EXAM: CT ABDOMEN AND PELVIS WITHOUT CONTRAST  TECHNIQUE: Multidetector CT imaging of the abdomen and pelvis was performed following the standard protocol without IV contrast.  COMPARISON:  None.  FINDINGS: The lung bases are clear. Multivessel coronary artery atherosclerosis.  No renal, ureteral, or bladder calculi. No obstructive uropathy. No perinephric stranding is seen. The kidneys are symmetric in size without evidence for exophytic mass. The bladder is unremarkable.  The liver demonstrates no focal abnormality. There are multiple cholelithiasis. The spleen demonstrates no focal abnormality. The adrenal glands and pancreas are normal.  The stomach, duodenum, small intestine and large intestine are unremarkable. There is a normal caliber appendix in the right lower quadrant without periappendiceal inflammatory changes. There is no pneumoperitoneum, pneumatosis, or portal venous gas. There is no abdominal or pelvic free fluid. There is no lymphadenopathy.  The abdominal aorta is normal in caliber with atherosclerosis.  Degenerative disc disease and degenerative facet arthropathy throughout the lumbar spine. There is a moderate broad-based disc bulge at L5-S1 with severe bilateral facet arthropathy resulting in bilateral foraminal stenosis.  IMPRESSION: 1. Cholelithiasis. 2. No urolithiasis or obstructive uropathy. 3. Multi vessel coronary artery disease. 4. Lumbar spine spondylosis.   Electronically Signed   By: Kathreen Devoid   On: 10/12/2013 18:58   Dg Chest 2 View  10/15/2013   CLINICAL DATA:  Atelectasis.  Abnormal lung exam.  EXAM: CHEST  2 VIEW  COMPARISON:   10/31/2012  FINDINGS: No cardiomegaly. Unremarkable mediastinal contours when accounting for distortion by rightward rotation. No consolidation, edema, effusion, or pneumothorax. Anterior and posterior cervical spine fixation. Proximal left humerus surgical anchors.  IMPRESSION: No active cardiopulmonary disease.   Electronically Signed   By: Jorje Guild M.D.   On: 10/15/2013 02:59   Ct Head Wo Contrast  10/07/2013   CLINICAL DATA:  Code stroke.  Left-sided weakness.  Slurred speech.  EXAM: CT HEAD WITHOUT CONTRAST  TECHNIQUE: Contiguous axial images were obtained from the base of the skull through the vertex without intravenous contrast.  COMPARISON:  CT scan dated 10/31/2012  FINDINGS: There is no acute intracranial hemorrhage, infarction, or mass lesion. There are subtle old periventricular white matter lucencies consistent with small vessel ischemic disease. There is diffuse slight cerebral cortical atrophy with dilatation of the third and lateral ventricles which is stable. No osseous abnormality.  IMPRESSION: No acute intracranial abnormality.   Electronically Signed   By: Rozetta Nunnery M.D.   On: 10/07/2013 12:46   Mr Jodene Nam Head Wo Contrast  10/07/2013   CLINICAL DATA:  Stroke  EXAM: MRI HEAD WITHOUT CONTRAST  MRA HEAD WITHOUT CONTRAST  TECHNIQUE: Multiplanar, multiecho pulse sequences of the brain and surrounding structures were obtained without intravenous contrast. Angiographic images of the head were obtained using MRA technique without contrast.  COMPARISON:  CT head 10/07/2013  FINDINGS: MRI HEAD FINDINGS  Image quality degraded by mild motion.  Negative for acute infarct.  Generalized atrophy. Moderate to advanced chronic microvascular ischemic change throughout the cerebral white matter and pons.  Negative for hemorrhage or mass. No edema or shift of the midline structures.  Paranasal sinuses are clear.  MRA HEAD FINDINGS  Image quality degraded by motion.  Both vertebral arteries are patent to  the basilar. Mild irregularity of the vertebral arteries. The basilar is widely patent. Fetal origin of the left posterior cerebral artery with hypoplastic left P1 segment. Posterior cerebral arteries are patent bilaterally.  Atherosclerotic irregularity in the cavernous carotid bilaterally  with mild narrowing. Anterior and middle cerebral arteries are patent bilaterally. There is diffuse irregularity of the anterior and middle cerebral arteries. This is most likely due to atherosclerotic disease however these vessels are not evaluated in detail due to motion. There appears to be a moderate stenosis of left MCA bifurcation is severe stenosis of the right MCA bifurcation. .  IMPRESSION: Negative for acute infarct. Moderate to advanced chronic microvascular ischemic change.  MRA is degraded by motion. There appears to be diffuse intracranial atherosclerotic disease, most severe involving the MCA bifurcation, right greater than left.   Electronically Signed   By: Franchot Gallo M.D.   On: 10/07/2013 17:07   Mr Brain Wo Contrast  10/07/2013   CLINICAL DATA:  Stroke  EXAM: MRI HEAD WITHOUT CONTRAST  MRA HEAD WITHOUT CONTRAST  TECHNIQUE: Multiplanar, multiecho pulse sequences of the brain and surrounding structures were obtained without intravenous contrast. Angiographic images of the head were obtained using MRA technique without contrast.  COMPARISON:  CT head 10/07/2013  FINDINGS: MRI HEAD FINDINGS  Image quality degraded by mild motion.  Negative for acute infarct.  Generalized atrophy. Moderate to advanced chronic microvascular ischemic change throughout the cerebral white matter and pons.  Negative for hemorrhage or mass. No edema or shift of the midline structures.  Paranasal sinuses are clear.  MRA HEAD FINDINGS  Image quality degraded by motion.  Both vertebral arteries are patent to the basilar. Mild irregularity of the vertebral arteries. The basilar is widely patent. Fetal origin of the left posterior  cerebral artery with hypoplastic left P1 segment. Posterior cerebral arteries are patent bilaterally.  Atherosclerotic irregularity in the cavernous carotid bilaterally with mild narrowing. Anterior and middle cerebral arteries are patent bilaterally. There is diffuse irregularity of the anterior and middle cerebral arteries. This is most likely due to atherosclerotic disease however these vessels are not evaluated in detail due to motion. There appears to be a moderate stenosis of left MCA bifurcation is severe stenosis of the right MCA bifurcation. .  IMPRESSION: Negative for acute infarct. Moderate to advanced chronic microvascular ischemic change.  MRA is degraded by motion. There appears to be diffuse intracranial atherosclerotic disease, most severe involving the MCA bifurcation, right greater than left.   Electronically Signed   By: Franchot Gallo M.D.   On: 10/07/2013 17:07   US Abdomen Complete  10/11/2013   CLINICAL DATA:  Epigastric abdominal pain.  EXAM: ULTRASOUND ABDOMEN COMPLETE  COMPARISON:  Renal ultrasound performed 06/21/2013  FINDINGS: Gallbladder:  A few tiny stones are seen dependently within the gallbladder; the gallbladder is otherwise unremarkable in appearance. No gallbladder wall thickening or pericholecystic fluid is seen. No ultrasonographic Murphy's sign is elicited.  Common bile duct:  Diameter: 0.6 cm, within normal limits in caliber.  Liver:  No focal lesion identified. Within normal limits in parenchymal echogenicity.  IVC:  No abnormality visualized.  Pancreas:  There is question of areas of mildly decreased pancreatic echogenicity, which could reflect mild edema.  Spleen:  Size and appearance within normal limits.  Right Kidney:  Length: 10.0 cm. Echogenicity within normal limits. No mass or hydronephrosis visualized.  Left Kidney:  Length: 9.6 cm. Echogenicity within normal limits. No mass or hydronephrosis visualized.  Abdominal aorta:  No aneurysm visualized.  Other  findings:  None.  IMPRESSION: 1. Question of areas of mildly decreased pancreatic echogenicity, which could reflect mild edema. Would correlate with lipase. 2. Cholelithiasis; gallbladder otherwise unremarkable in appearance.   Electronically Signed   By:  Garald Balding M.D.   On: 10/11/2013 06:10   Nm Hepato W/eject Fract  10/15/2013   CLINICAL DATA:  Upper abdominal pain  EXAM: NUCLEAR MEDICINE HEPATOBILIARY IMAGING WITH GALLBLADDER EF  Views:  Anterior right upper quadrant  Radionuclide:  Technetium 24m Choletec  Dose:  5.5 mCi  Route of administration: Intravenous  COMPARISON:  None.  FINDINGS: Liver uptake of radiotracer is normal. There is prompt visualization of gallbladder and small bowel, indicating patency of the cystic and common bile ducts. The patient consumed 8 oz of Ensure Plus orally with calculation of the computer generated ejection fraction of radiotracer from the gallbladder. The patient did complain abdominal pain with the oral Ensure Plus consumption. The computer generated ejection fraction of radiotracer from the gallbladder is normal at 76.1%, normal greater than 33% using the oral agent.  IMPRESSION: Normal ejection fraction of radiotracer from the gallbladder. Note that the patient did experience pain with the oral Ensure Plus consumption. Cystic and common bile ducts are patent as is evidenced by visualization of gallbladder and small bowel.   Electronically Signed   By: Lowella Grip M.D.   On: 10/15/2013 11:07   Dg Foot 2 Views Left  10/09/2013   CLINICAL DATA:  Lateral left heel wound that has not healed for 8 months. Diabetes.  EXAM: LEFT FOOT - 2 VIEW  COMPARISON:  03/18/2013  FINDINGS: The bones are diffusely osteopenic. No acute fracture or dislocation is seen. No osseous erosion or dissecting soft tissue emphysema is identified. Soft tissue swelling about the ankle has decreased from the prior study. Diffuse vascular calcification is present.  IMPRESSION: Osteopenia.  No  radiographic evidence active osteomyelitis.   Electronically Signed   By: Logan Bores   On: 10/09/2013 11:19    Microbiology: No results found for this or any previous visit (from the past 240 hour(s)).   Labs: Basic Metabolic Panel:  Recent Labs Lab 10/11/13 0400 10/12/13 0532 10/13/13 1030 10/15/13 1303  NA 143 143 143 140  K 4.9 4.7 4.1 4.6  CL 105 109 108 107  CO2 24 26 24 24   GLUCOSE 260* 106* 103* 269*  BUN 20 25* 24* 21  CREATININE 1.14* 1.54* 1.59* 1.43*  CALCIUM 9.9 8.8 9.2 9.4   Liver Function Tests:  Recent Labs Lab 10/11/13 0400  AST 13  ALT 10  ALKPHOS 73  BILITOT 0.2*  PROT 7.2  ALBUMIN 3.1*    Recent Labs Lab 10/11/13 0400  LIPASE 15   No results found for this basename: AMMONIA,  in the last 168 hours CBC:  Recent Labs Lab 10/10/13 0542 10/11/13 0400 10/12/13 0532 10/13/13 1030  WBC 11.4* 14.6* 8.8 8.0  NEUTROABS  --  11.1*  --   --   HGB 10.9* 12.6 9.2* 10.1*  HCT 33.9* 39.5 29.7* 31.0*  MCV 90.6 90.4 92.5 90.6  PLT 233 246 188 203   Cardiac Enzymes:  Recent Labs Lab 10/11/13 0400  TROPONINI <0.30   BNP: BNP (last 3 results) No results found for this basename: PROBNP,  in the last 8760 hours CBG:  Recent Labs Lab 10/15/13 0749 10/15/13 1136 10/15/13 1710 10/15/13 2216 10/16/13 0750  GLUCAP 200* 263* 317* 216* 223*       Signed:  Elizaveta Mattice  [facility] 10/16/2013, 11:15 AM

## 2013-11-21 ENCOUNTER — Ambulatory Visit (INDEPENDENT_AMBULATORY_CARE_PROVIDER_SITE_OTHER): Payer: Medicare PPO | Admitting: Surgery

## 2013-11-22 ENCOUNTER — Emergency Department (HOSPITAL_COMMUNITY)
Admission: EM | Admit: 2013-11-22 | Discharge: 2013-11-22 | Disposition: A | Discharge status: Home / Self Care | Payer: Medicare HMO | Attending: Emergency Medicine | Admitting: Emergency Medicine

## 2013-11-22 ENCOUNTER — Emergency Department (HOSPITAL_COMMUNITY): Payer: Medicare HMO

## 2013-11-22 ENCOUNTER — Encounter (HOSPITAL_COMMUNITY): Payer: Self-pay | Admitting: Emergency Medicine

## 2013-11-22 DIAGNOSIS — Z7982 Long term (current) use of aspirin: Secondary | ICD-10-CM | POA: Diagnosis not present

## 2013-11-22 DIAGNOSIS — E7800 Pure hypercholesterolemia, unspecified: Secondary | ICD-10-CM | POA: Diagnosis not present

## 2013-11-22 DIAGNOSIS — Z794 Long term (current) use of insulin: Secondary | ICD-10-CM | POA: Diagnosis not present

## 2013-11-22 DIAGNOSIS — E162 Hypoglycemia, unspecified: Secondary | ICD-10-CM

## 2013-11-22 DIAGNOSIS — Z79899 Other long term (current) drug therapy: Secondary | ICD-10-CM | POA: Diagnosis not present

## 2013-11-22 DIAGNOSIS — E1169 Type 2 diabetes mellitus with other specified complication: Secondary | ICD-10-CM | POA: Insufficient documentation

## 2013-11-22 DIAGNOSIS — J441 Chronic obstructive pulmonary disease with (acute) exacerbation: Secondary | ICD-10-CM | POA: Insufficient documentation

## 2013-11-22 DIAGNOSIS — E1149 Type 2 diabetes mellitus with other diabetic neurological complication: Secondary | ICD-10-CM | POA: Diagnosis not present

## 2013-11-22 DIAGNOSIS — I129 Hypertensive chronic kidney disease with stage 1 through stage 4 chronic kidney disease, or unspecified chronic kidney disease: Secondary | ICD-10-CM | POA: Diagnosis not present

## 2013-11-22 DIAGNOSIS — N390 Urinary tract infection, site not specified: Secondary | ICD-10-CM | POA: Diagnosis not present

## 2013-11-22 DIAGNOSIS — R609 Edema, unspecified: Secondary | ICD-10-CM | POA: Insufficient documentation

## 2013-11-22 DIAGNOSIS — Z9889 Other specified postprocedural states: Secondary | ICD-10-CM | POA: Insufficient documentation

## 2013-11-22 DIAGNOSIS — N189 Chronic kidney disease, unspecified: Secondary | ICD-10-CM | POA: Insufficient documentation

## 2013-11-22 DIAGNOSIS — F17200 Nicotine dependence, unspecified, uncomplicated: Secondary | ICD-10-CM | POA: Diagnosis not present

## 2013-11-22 DIAGNOSIS — E039 Hypothyroidism, unspecified: Secondary | ICD-10-CM | POA: Insufficient documentation

## 2013-11-22 DIAGNOSIS — E1142 Type 2 diabetes mellitus with diabetic polyneuropathy: Secondary | ICD-10-CM | POA: Insufficient documentation

## 2013-11-22 DIAGNOSIS — Z880 Allergy status to penicillin: Secondary | ICD-10-CM | POA: Insufficient documentation

## 2013-11-22 DIAGNOSIS — I2510 Atherosclerotic heart disease of native coronary artery without angina pectoris: Secondary | ICD-10-CM | POA: Diagnosis not present

## 2013-11-22 DIAGNOSIS — E669 Obesity, unspecified: Secondary | ICD-10-CM | POA: Diagnosis not present

## 2013-11-22 LAB — CBC
HEMATOCRIT: 32.1 % — AB (ref 36.0–46.0)
Hemoglobin: 10.3 g/dL — ABNORMAL LOW (ref 12.0–15.0)
MCH: 28.7 pg (ref 26.0–34.0)
MCHC: 32.1 g/dL (ref 30.0–36.0)
MCV: 89.4 fL (ref 78.0–100.0)
Platelets: 256 10*3/uL (ref 150–400)
RBC: 3.59 MIL/uL — ABNORMAL LOW (ref 3.87–5.11)
RDW: 13.2 % (ref 11.5–15.5)
WBC: 10.7 10*3/uL — ABNORMAL HIGH (ref 4.0–10.5)

## 2013-11-22 LAB — CBG MONITORING, ED
GLUCOSE-CAPILLARY: 110 mg/dL — AB (ref 70–99)
GLUCOSE-CAPILLARY: 81 mg/dL (ref 70–99)
Glucose-Capillary: 187 mg/dL — ABNORMAL HIGH (ref 70–99)

## 2013-11-22 LAB — URINALYSIS, ROUTINE W REFLEX MICROSCOPIC
Bilirubin Urine: NEGATIVE
GLUCOSE, UA: NEGATIVE mg/dL
Ketones, ur: NEGATIVE mg/dL
Nitrite: NEGATIVE
PH: 5.5 (ref 5.0–8.0)
Protein, ur: 100 mg/dL — AB
SPECIFIC GRAVITY, URINE: 1.012 (ref 1.005–1.030)
Urobilinogen, UA: 0.2 mg/dL (ref 0.0–1.0)

## 2013-11-22 LAB — URINE MICROSCOPIC-ADD ON

## 2013-11-22 LAB — BASIC METABOLIC PANEL
Anion gap: 13 (ref 5–15)
BUN: 37 mg/dL — AB (ref 6–23)
CO2: 24 mEq/L (ref 19–32)
Calcium: 9.4 mg/dL (ref 8.4–10.5)
Chloride: 103 mEq/L (ref 96–112)
Creatinine, Ser: 1.69 mg/dL — ABNORMAL HIGH (ref 0.50–1.10)
GFR calc Af Amer: 33 mL/min — ABNORMAL LOW (ref >90)
GFR calc non Af Amer: 28 mL/min — ABNORMAL LOW (ref >90)
Glucose, Bld: 82 mg/dL (ref 70–99)
Potassium: 4.2 mEq/L (ref 3.7–5.3)
Sodium: 140 mEq/L (ref 137–147)

## 2013-11-22 MED ORDER — CIPROFLOXACIN HCL 500 MG PO TABS: 500.0000 mg | ORAL_TABLET | Freq: Two times a day (BID) | ORAL | Status: DC

## 2013-11-22 MED ORDER — CIPROFLOXACIN IN D5W 400 MG/200ML IV SOLN
400.0000 mg | Freq: Once | INTRAVENOUS | Status: AC
  Administered 2013-11-22: 400 mg via INTRAVENOUS
  Filled 2013-11-22: qty 200

## 2013-11-22 MED ORDER — IPRATROPIUM-ALBUTEROL 0.5-2.5 (3) MG/3ML IN SOLN
3.0000 mL | Freq: Once | RESPIRATORY_TRACT | Status: AC
  Administered 2013-11-22: 3 mL via RESPIRATORY_TRACT
  Filled 2013-11-22: qty 3

## 2013-11-22 NOTE — ED Notes (Signed)
Bed: TM21 Expected date: 11/21/13 Expected time: 11:54 PM Means of arrival: Ambulance Comments: Hypoglycemia

## 2013-11-22 NOTE — ED Notes (Signed)
[facility] called and made aware of need to transport patient back to [facility] 

## 2013-11-22 NOTE — ED Notes (Signed)
[facility] called at [phone] and made aware that patient has been DC from ED and will be returning shortly All questions answered by this nurse

## 2013-11-22 NOTE — ED Notes (Signed)
Patient given grape juice.

## 2013-11-22 NOTE — ED Notes (Signed)
[facility] present on unit to take patient home Patient in NAD upon leaving ED

## 2013-11-22 NOTE — ED Notes (Signed)
Patient back from x-ray 

## 2013-11-22 NOTE — ED Provider Notes (Signed)
CSN: [id number]     Arrival date & time 11/22/13  0017 History   First MD Initiated Contact with Patient 11/22/13 0105     Chief Complaint  Patient presents with  . Hypoglycemia     (Consider location/radiation/quality/duration/timing/severity/associated sxs/prior Treatment) HPI 79 year old female presents to the emergency department via EMS from her nursing facility with report of hypoglycemia.  Patient reports that she had a normal meal.  She was given her insulin as scheduled.  Per EMS, CBGs were 40 and then 20 despite oral glucose.  Patient was given D50 by EMS.  Patient denies any fever chills nausea vomiting or diarrhea.  No urinary symptoms.  Patient has history of hypertension diabetes COPD chronic kidney disease.  Patient recently admitted in May for urinary tract infection and cholelithiasis.  She denies any abdominal pain at this time Past Medical History  Diagnosis Date  . Hypertension   . Macular degeneration, bilateral   . Type II diabetes mellitus   . COPD (chronic obstructive pulmonary disease)   . Diabetic peripheral neuropathy   . Hypercholesteremia   . PAD (peripheral artery disease)   . Chronic kidney disease   . Headache(784.0)   . Hypothyroid   . Coronary artery disease    Past Surgical History  Procedure Laterality Date  . Tonsillectomy  1940's  . Abdominal hysterectomy  1970's  . Dilation and curettage of uterus  1970's    "probably 2" (11/01/2012)  . Cataract extraction w/ intraocular lens  implant, bilateral Bilateral ~ 2010  . Bypass graft Right ~ 1997    RLE by Dr. Gwenlyn Perking 11/29/2005 (11/01/2012)  . Cystostomy w/ bladder biopsy  2005    Archie Endo 09/19/2003 (11/01/2012)  . Anterior cervical decomp/discectomy fusion  01/02/2006    Archie Endo 01/02/2006 (11/01/2012)  . Shoulder open rotator cuff repair Bilateral 1990's-2000's    "3X on the left; twice on the right" (11/01/2012)  . Cardiac catheterization  11/29/2005    Archie Endo 11/29/2005 (11/01/2012)  .  Esophagogastroduodenoscopy N/A 10/14/2013    Procedure: ESOPHAGOGASTRODUODENOSCOPY (EGD);  Surgeon: Missy Sabins, MD;  Location: [facility];  Service: Endoscopy;  Laterality: N/A;   History reviewed. No pertinent family history. History  Substance Use Topics  . Smoking status: Current Every Day Smoker -- 0.12 packs/day for 60 years    Types: Cigarettes  . Smokeless tobacco: Never Used  . Alcohol Use: No   OB History   Grav Para Term Preterm Abortions TAB SAB Ect Mult Living                 Review of Systems  See History of Present Illness; otherwise all other systems are reviewed and negative   Allergies  Codeine; Lisinopril; Penicillins; Septra; and Sulfur  Home Medications   Prior to Admission medications   Medication Sig Start Date End Date Taking? Authorizing Provider  acetaminophen (TYLENOL) 500 MG tablet Take 500 mg by mouth every 4 (four) hours as needed for mild pain, fever or headache.   Yes Historical Provider, MD  albuterol (PROVENTIL) (2.5 MG/3ML) 0.083% nebulizer solution Take 2.5 mg by nebulization every 6 (six) hours as needed for wheezing.   Yes Historical Provider, MD  ALPRAZolam (XANAX) 0.25 MG tablet Take 1 tablet (0.25 mg total) by mouth 2 (two) times daily as needed for anxiety. 10/16/13  Yes Maryann Mikhail, DO  alum & mag hydroxide-simeth (MAALOX/MYLANTA) 200-200-20 MG/5ML suspension Take 30 mLs by mouth every 6 (six) hours as needed for indigestion or heartburn. Do not exceed 4  doses in 24 hours   Yes Historical Provider, MD  amitriptyline (ELAVIL) 50 MG tablet Take 50 mg by mouth at bedtime.   Yes Historical Provider, MD  amLODipine (NORVASC) 10 MG tablet Take 10 mg by mouth daily.    Yes Historical Provider, MD  aspirin 81 MG chewable tablet Chew 81 mg by mouth daily.   Yes Historical Provider, MD  budesonide-formoterol (SYMBICORT) 160-4.5 MCG/ACT inhaler Inhale 2 puffs into the lungs 2 (two) times daily.   Yes Historical Provider, MD  carvedilol (COREG) 25  MG tablet Take 25 mg by mouth 2 (two) times daily with a meal. 0800 and 1700   Yes Historical Provider, MD  cloNIDine (CATAPRES) 0.2 MG tablet Take 0.2 mg by mouth 2 (two) times daily. Hold if blood pressure less than 100/60   Yes Historical Provider, MD  collagenase (SANTYL) ointment Apply 1 application topically daily. Apply for heel changes 3 times per week.   Yes Historical Provider, MD  furosemide (LASIX) 40 MG tablet Take 40 mg by mouth daily.   Yes Historical Provider, MD  gabapentin (NEURONTIN) 100 MG capsule Take 200 mg by mouth 3 (three) times daily.   Yes Historical Provider, MD  guaiFENesin (ROBITUSSIN) 100 MG/5ML liquid Take 100 mg by mouth every 6 (six) hours as needed for cough. Not to exceed 4 doses in 24 hours   Yes Historical Provider, MD  HYDROcodone-acetaminophen (NORCO/VICODIN) 5-325 MG per tablet Take 1 tablet by mouth every 8 (eight) hours as needed for moderate pain.   Yes Historical Provider, MD  insulin glargine (LANTUS) 100 UNIT/ML injection Inject 41 Units into the skin at bedtime.   Yes Historical Provider, MD  insulin lispro (HUMALOG) 100 UNIT/ML injection Inject 7 Units into the skin once. CBG greater than 350=14 units   Yes Historical Provider, MD  lactose free nutrition (BOOST PLUS) LIQD Take 237 mLs by mouth 3 (three) times daily with meals. 10/16/13  Yes Maryann Mikhail, DO  levothyroxine (SYNTHROID, LEVOTHROID) 50 MCG tablet Take 50 mcg by mouth every morning.    Yes Historical Provider, MD  loperamide (IMODIUM) 2 MG capsule Take 2 mg by mouth as needed for diarrhea or loose stools. Do not exceed 8 doses in 24 hours   Yes Historical Provider, MD  magnesium hydroxide (MILK OF MAGNESIA) 400 MG/5ML suspension Take 30 mLs by mouth daily as needed for mild constipation.   Yes Historical Provider, MD  Multiple Vitamins-Minerals (HEALTHY EYES) TABS Take 1 tablet by mouth every morning.    Yes Historical Provider, MD  pantoprazole (PROTONIX) 40 MG tablet Take 1 tablet (40 mg  total) by mouth daily. 10/16/13  Yes Maryann Mikhail, DO  polyethylene glycol (MIRALAX / GLYCOLAX) packet Take 17 g by mouth daily as needed for mild constipation, moderate constipation or severe constipation.    Yes Historical Provider, MD  promethazine (PHENERGAN) 25 MG tablet Take 25 mg by mouth every 6 (six) hours as needed for nausea or vomiting.   Yes Historical Provider, MD  rosuvastatin (CRESTOR) 20 MG tablet Take 20 mg by mouth every evening.    Yes Historical Provider, MD  senna-docusate (SENOKOT-S) 8.6-50 MG per tablet Take 2 tablets by mouth at bedtime.    Yes Historical Provider, MD  tiotropium (SPIRIVA) 18 MCG inhalation capsule Place 18 mcg into inhaler and inhale daily.   Yes Historical Provider, MD  traMADol (ULTRAM) 50 MG tablet Take 50 mg by mouth every 6 (six) hours as needed.   Yes Historical Provider, MD  sucralfate (CARAFATE) 1 GM/10ML suspension Take 10 mLs (1 g total) by mouth every 6 (six) hours. 10/16/13   Maryann Mikhail, DO   BP 158/62  Pulse 56  Temp(Src) 97.5 F (36.4 C) (Oral)  Resp 12  SpO2 99% Physical Exam  Nursing note and vitals reviewed. Constitutional: She is oriented to person, place, and time. She appears well-developed and well-nourished.  Obese elderly female, chronically ill-appearing in no acute distress  HENT:  Head: Normocephalic and atraumatic.  Nose: Nose normal.  Mouth/Throat: Oropharynx is clear and moist.  Eyes: Conjunctivae and EOM are normal. Pupils are equal, round, and reactive to light.  Neck: Normal range of motion. Neck supple. No JVD present. No tracheal deviation present. No thyromegaly present.  Cardiovascular: Normal rate, regular rhythm, normal heart sounds and intact distal pulses.  Exam reveals no gallop and no friction rub.   No murmur heard. Pulmonary/Chest: Effort normal. No stridor. No respiratory distress. She has wheezes. She has no rales. She exhibits no tenderness.  Coarse breath sounds bilaterally  Abdominal: Soft.  Bowel sounds are normal. She exhibits no distension and no mass. There is no tenderness. There is no rebound and no guarding.  Musculoskeletal: Normal range of motion. She exhibits edema (trace bilaterally). She exhibits no tenderness.  Lymphadenopathy:    She has no cervical adenopathy.  Neurological: She is alert and oriented to person, place, and time. She exhibits normal muscle tone. Coordination normal.  Skin: Skin is warm and dry. No rash noted. No erythema. No pallor.  Psychiatric: She has a normal mood and affect. Her behavior is normal. Judgment and thought content normal.    ED Course  Procedures (including critical care time) Labs Review Labs Reviewed  CBC - Abnormal; Notable for the following:    WBC 10.7 (*)    RBC 3.59 (*)    Hemoglobin 10.3 (*)    HCT 32.1 (*)    All other components within normal limits  BASIC METABOLIC PANEL - Abnormal; Notable for the following:    BUN 37 (*)    Creatinine, Ser 1.69 (*)    GFR calc non Af Amer 28 (*)    GFR calc Af Amer 33 (*)    All other components within normal limits  URINALYSIS, ROUTINE W REFLEX MICROSCOPIC - Abnormal; Notable for the following:    APPearance TURBID (*)    Hgb urine dipstick MODERATE (*)    Protein, ur 100 (*)    Leukocytes, UA LARGE (*)    All other components within normal limits  URINE MICROSCOPIC-ADD ON - Abnormal; Notable for the following:    Bacteria, UA MANY (*)    All other components within normal limits  CBG MONITORING, ED - Abnormal; Notable for the following:    Glucose-Capillary 110 (*)    All other components within normal limits  CBG MONITORING, ED - Abnormal; Notable for the following:    Glucose-Capillary 187 (*)    All other components within normal limits  URINE CULTURE  CBG MONITORING, ED    Imaging Review Dg Chest 2 View  11/22/2013   CLINICAL DATA:  Hypoglycemia.  EXAM: CHEST  2 VIEW  COMPARISON:  Chest radiograph October 14, 2013  FINDINGS: Cardiac silhouette is mildly enlarged,  possibly increased from prior examination. Mediastinal silhouette is nonsuspicious, moderately calcified aortic knob. No pleural effusions or focal consolidations. No pneumothorax.  Large body habitus. Bilateral humeral head suture anchor. Cervical instrumentation.  IMPRESSION: Mild cardiomegaly, no acute pulmonary process.   Electronically  Signed   By: Elon Alas   On: 11/22/2013 03:22     EKG Interpretation None      MDM   Final diagnoses:  Hypoglycemia  Urinary tract infection without hematuria, site unspecified    79 year old female with hypoglycemia.  She is maintaining her blood sugars while here in the emergency department.  She is noted to have a significant urinary tract infection.  Chest x-ray without pneumonia.  She is wheezing or sees a DuoNeb.  Prior cultures reviewed, sensitive to Levaquin.  Will start on Cipro.  Patient has history of allergies to penicillin, and Septra.    Kalman Drape, MD 11/22/13 [phone]

## 2013-11-22 NOTE — ED Notes (Signed)
Notified RN, Raquel Sarna pt. CBG 81.

## 2013-11-22 NOTE — ED Notes (Signed)
MD at bedside. 

## 2013-11-22 NOTE — ED Notes (Signed)
Patient arrives via [facility] from nursing home due to c/o hypoglycemia Nursing home checked patient's CBG: CBG 40s--1st tube of oral glucose given by nursing home staff FD arrives at nursing home: FD checks CBG in 20s--2nd oral glucose given When GC arrives at nursing home, EMS gives D50 via PIV and CBG improved Patient did not want to come to ED Patient arrives alert and oriented x 4 and denies complaints at this time

## 2013-11-22 NOTE — Discharge Instructions (Signed)
Make sure that you are eating appropriate meals.  Take antibiotic as prescribed.  Follow up with your doctor for recheck in 2-3 days.  Return to the ER for worsening condition or new concerning symptoms.   Hypoglycemia Hypoglycemia occurs when the glucose in your blood is too low. Glucose is a type of sugar that is your body's main energy source. Hormones, such as insulin and glucagon, control the level of glucose in the blood. Insulin lowers blood glucose and glucagon increases blood glucose. Having too much insulin in your blood stream, or not eating enough food containing sugar, can result in hypoglycemia. Hypoglycemia can happen to people with or without diabetes. It can develop quickly and can be a medical emergency.  CAUSES   Missing or delaying meals.  Not eating enough carbohydrates at meals.  Taking too much diabetes medicine.  Not timing your oral diabetes medicine or insulin doses with meals, snacks, and exercise.  Nausea and vomiting.  Certain medicines.  Severe illnesses, such as hepatitis, kidney disorders, and certain eating disorders.  Increased activity or exercise without eating something extra or adjusting medicines.  Drinking too much alcohol.  A nerve disorder that affects body functions like your heart rate, blood pressure, and digestion (autonomic neuropathy).  A condition where the stomach muscles do not function properly (gastroparesis). Therefore, medicines and food may not absorb properly.  Rarely, a tumor of the pancreas can produce too much insulin. SYMPTOMS   Hunger.  Sweating (diaphoresis).  Change in body temperature.  Shakiness.  Headache.  Anxiety.  Lightheadedness.  Irritability.  Difficulty concentrating.  Dry mouth.  Tingling or numbness in the hands or feet.  Restless sleep or sleep disturbances.  Altered speech and coordination.  Change in mental status.  Seizures or prolonged  convulsions.  Combativeness.  Drowsiness (lethargic).  Weakness.  Increased heart rate or palpitations.  Confusion.  Pale, gray skin color.  Blurred or double vision.  Fainting. DIAGNOSIS  A physical exam and medical history will be performed. Your caregiver may make a diagnosis based on your symptoms. Blood tests and other lab tests may be performed to confirm a diagnosis. Once the diagnosis is made, your caregiver will see if your signs and symptoms go away once your blood glucose is raised.  TREATMENT  Usually, you can easily treat your hypoglycemia when you notice symptoms.  Check your blood glucose. If it is less than 70 mg/dl, take one of the following:   3-4 glucose tablets.    cup juice.    cup regular soda.   1 cup skim milk.   -1 tube of glucose gel.   5-6 hard candies.   Avoid high-fat drinks or food that may delay a rise in blood glucose levels.  Do not take more than the recommended amount of sugary foods, drinks, gel, or tablets. Doing so will cause your blood glucose to go too high.   Wait 10-15 minutes and recheck your blood glucose. If it is still less than 70 mg/dl or below your target range, repeat treatment.   Eat a snack if it is more than 1 hour until your next meal.  There may be a time when your blood glucose may go so low that you are unable to treat yourself at home when you start to notice symptoms. You may need someone to help you. You may even faint or be unable to swallow. If you cannot treat yourself, someone will need to bring you to the hospital.  [place]  If you have diabetes, follow your diabetes management plan by:  Taking your medicines as directed.  Following your exercise plan.  Following your meal plan. Do not skip meals. Eat on time.  Testing your blood glucose regularly. Check your blood glucose before and after exercise. If you exercise longer or different than usual, be sure to check blood  glucose more frequently.  Wearing your medical alert jewelry that says you have diabetes.  Identify the cause of your hypoglycemia. Then, develop ways to prevent the recurrence of hypoglycemia.  Do not take a hot bath or shower right after an insulin shot.  Always carry treatment with you. Glucose tablets are the easiest to carry.  If you are going to drink alcohol, drink it only with meals.  Tell friends or family members ways to keep you safe during a seizure. This may include removing hard or sharp objects from the area or turning you on your side.  Maintain a healthy weight. SEEK MEDICAL CARE IF:   You are having problems keeping your blood glucose in your target range.  You are having frequent episodes of hypoglycemia.  You feel you might be having side effects from your medicines.  You are not sure why your blood glucose is dropping so low.  You notice a change in vision or a new problem with your vision. SEEK IMMEDIATE MEDICAL CARE IF:   Confusion develops.  A change in mental status occurs.  The inability to swallow develops.  Fainting occurs. Document Released: 05/02/2005 Document Revised: 05/07/2013 Document Reviewed: 08/29/2011 [facility] Patient Information 2015 [place], [place]. This information is not intended to replace advice given to you by your health care provider. Make sure you discuss any questions you have with your health care provider.  Urinary Tract Infection Urinary tract infections (UTIs) can develop anywhere along your urinary tract. Your urinary tract is your body's drainage system for removing wastes and extra water. Your urinary tract includes two kidneys, two ureters, a bladder, and a urethra. Your kidneys are a pair of bean-shaped organs. Each kidney is about the size of your fist. They are located below your ribs, one on each side of your spine. CAUSES Infections are caused by microbes, which are microscopic organisms, including fungi, viruses,  and bacteria. These organisms are so small that they can only be seen through a microscope. Bacteria are the microbes that most commonly cause UTIs. SYMPTOMS  Symptoms of UTIs may vary by age and gender of the patient and by the location of the infection. Symptoms in young women typically include a frequent and intense urge to urinate and a painful, burning feeling in the bladder or urethra during urination. Older women and men are more likely to be tired, shaky, and weak and have muscle aches and abdominal pain. A fever may mean the infection is in your kidneys. Other symptoms of a kidney infection include pain in your back or sides below the ribs, nausea, and vomiting. DIAGNOSIS To diagnose a UTI, your caregiver will ask you about your symptoms. Your caregiver also will ask to provide a urine sample. The urine sample will be tested for bacteria and white blood cells. White blood cells are made by your body to help fight infection. TREATMENT  Typically, UTIs can be treated with medication. Because most UTIs are caused by a bacterial infection, they usually can be treated with the use of antibiotics. The choice of antibiotic and length of treatment depend on your symptoms and the type of bacteria causing  your infection. HOME CARE INSTRUCTIONS  If you were prescribed antibiotics, take them exactly as your caregiver instructs you. Finish the medication even if you feel better after you have only taken some of the medication.  Drink enough water and fluids to keep your urine clear or pale yellow.  Avoid caffeine, tea, and carbonated beverages. They tend to irritate your bladder.  Empty your bladder often. Avoid holding urine for long periods of time.  Empty your bladder before and after sexual intercourse.  After a bowel movement, women should cleanse from front to back. Use each tissue only once. SEEK MEDICAL CARE IF:   You have back pain.  You develop a fever.  Your symptoms do not begin to  resolve within 3 days. SEEK IMMEDIATE MEDICAL CARE IF:   You have severe back pain or lower abdominal pain.  You develop chills.  You have nausea or vomiting.  You have continued burning or discomfort with urination. MAKE SURE YOU:   Understand these instructions.  Will watch your condition.  Will get help right away if you are not doing well or get worse. Document Released: 02/09/2005 Document Revised: 11/01/2011 Document Reviewed: 06/10/2011 [facility] Patient Information 2015 [place], [place]. This information is not intended to replace advice given to you by your health care provider. Make sure you discuss any questions you have with your health care provider.

## 2013-11-22 NOTE — ED Notes (Signed)
Patient transported to X-ray 

## 2013-11-23 LAB — URINE CULTURE: Colony Count: 65000

## 2013-12-18 ENCOUNTER — Ambulatory Visit (INDEPENDENT_AMBULATORY_CARE_PROVIDER_SITE_OTHER): Payer: Medicare HMO | Admitting: Ophthalmology

## 2013-12-18 DIAGNOSIS — E1139 Type 2 diabetes mellitus with other diabetic ophthalmic complication: Secondary | ICD-10-CM

## 2013-12-18 DIAGNOSIS — H43819 Vitreous degeneration, unspecified eye: Secondary | ICD-10-CM

## 2013-12-18 DIAGNOSIS — E1165 Type 2 diabetes mellitus with hyperglycemia: Secondary | ICD-10-CM

## 2013-12-18 DIAGNOSIS — H35039 Hypertensive retinopathy, unspecified eye: Secondary | ICD-10-CM

## 2013-12-18 DIAGNOSIS — E11319 Type 2 diabetes mellitus with unspecified diabetic retinopathy without macular edema: Secondary | ICD-10-CM

## 2013-12-18 DIAGNOSIS — H3530 Unspecified macular degeneration: Secondary | ICD-10-CM

## 2013-12-18 DIAGNOSIS — I10 Essential (primary) hypertension: Secondary | ICD-10-CM

## 2013-12-18 DIAGNOSIS — E11359 Type 2 diabetes mellitus with proliferative diabetic retinopathy without macular edema: Secondary | ICD-10-CM

## 2014-01-16 ENCOUNTER — Ambulatory Visit (INDEPENDENT_AMBULATORY_CARE_PROVIDER_SITE_OTHER): Payer: Medicare HMO | Admitting: Nurse Practitioner

## 2014-01-16 ENCOUNTER — Encounter: Payer: Self-pay | Admitting: Nurse Practitioner

## 2014-01-16 VITALS — BP 137/69 | HR 70

## 2014-01-16 DIAGNOSIS — R29898 Other symptoms and signs involving the musculoskeletal system: Secondary | ICD-10-CM

## 2014-01-16 DIAGNOSIS — G459 Transient cerebral ischemic attack, unspecified: Secondary | ICD-10-CM

## 2014-01-16 DIAGNOSIS — I2510 Atherosclerotic heart disease of native coronary artery without angina pectoris: Secondary | ICD-10-CM

## 2014-01-16 MED ORDER — ASPIRIN EC 325 MG PO TBEC: 325.0000 mg | DELAYED_RELEASE_TABLET | Freq: Every day | ORAL | Status: DC

## 2014-01-16 NOTE — Progress Notes (Signed)
PATIENT: [name] DOB: 03/18/1935  REASON FOR VISIT: hospital follow up for TIA HISTORY FROM: patient  HISTORY OF PRESENT ILLNESS: [name] is an 79 y.o. Female who comes to the office for first hospital follow up post hospital discharge for stroke. She has a past medical history significant for HTN, DM type II, CAD, PAD, CKD, COPD, bilateral macular degeneration, and hypothyroidism, brought in by ambulance on 10/07/13 as a code stroke due to  Left hemiparesis, left face weakness, dysarthria. She is a resident at [facility].  She denies ever having similar symptoms before, but the morning of 10/07/2013 around 10 am she was noted to have slurred speech and left face and left sided weakness. She said that she has been feeling weak for the past 2-3 weeks and said that the only thing different on the 25th was "my slurred speech". Denied HA, vertigo, double vision, difficulty swallowing, but complains of numbness left side. No chest pain or palpitations. Patient was not administered TPA secondary to low NIHSS. She was admitted for further evaluation and treatment. MRI of the brain was negative for acute infarct. Moderate to advanced chronic microvascular ischemic changes. MRA of the brain showed diffuse intracranial atherosclerotic disease, most severe involving the MCA bifurcation, right greater than left. 2D Echocardiogram with EF 55-60% with no source of embolus. Carotid Doppler showed bilateral 40-59% internal carotid artery stenosis. HgbA1c 9.8, LDL 28.  She has poor functional status at baseline. She is an every day smoker.  REVIEW OF SYSTEMS: Full 14 system review of systems performed and notable only for: Loss of vision, restless legs, insomnia, muscle cramps, bruise easily, moles   ALLERGIES: Allergies  Allergen Reactions  . Codeine Other (See Comments)    unknown  . Lisinopril Other (See Comments)    unknown  . Penicillins Hives  . Septra  [Sulfamethoxazole-Trimethoprim] Hives and Rash  . Sulfur Hives and Rash    Break out     HOME MEDICATIONS: Outpatient Prescriptions Prior to Visit  Medication Sig Dispense Refill  . acetaminophen (TYLENOL) 500 MG tablet Take 500 mg by mouth every 4 (four) hours as needed for mild pain, fever or headache.      . albuterol (PROVENTIL) (2.5 MG/3ML) 0.083% nebulizer solution Take 2.5 mg by nebulization every 6 (six) hours as needed for wheezing.      Marland Kitchen ALPRAZolam (XANAX) 0.25 MG tablet Take 1 tablet (0.25 mg total) by mouth 2 (two) times daily as needed for anxiety.  30 tablet  0  . alum & mag hydroxide-simeth (MAALOX/MYLANTA) 200-200-20 MG/5ML suspension Take 30 mLs by mouth every 6 (six) hours as needed for indigestion or heartburn. Do not exceed 4 doses in 24 hours      . amitriptyline (ELAVIL) 50 MG tablet Take 50 mg by mouth at bedtime.      Marland Kitchen amLODipine (NORVASC) 10 MG tablet Take 10 mg by mouth daily.       . budesonide-formoterol (SYMBICORT) 160-4.5 MCG/ACT inhaler Inhale 2 puffs into the lungs 2 (two) times daily.      . carvedilol (COREG) 25 MG tablet Take 25 mg by mouth 2 (two) times daily with a meal. 0800 and 1700      . ciprofloxacin (CIPRO) 500 MG tablet Take 1 tablet (500 mg total) by mouth 2 (two) times daily.  20 tablet  0  . cloNIDine (CATAPRES) 0.2 MG tablet Take 0.2 mg by mouth 2 (two) times daily. Hold if blood pressure  less than 100/60      . collagenase (SANTYL) ointment Apply 1 application topically daily. Apply for heel changes 3 times per week.      . furosemide (LASIX) 40 MG tablet Take 40 mg by mouth daily.      Marland Kitchen gabapentin (NEURONTIN) 100 MG capsule Take 200 mg by mouth 3 (three) times daily.      Marland Kitchen guaiFENesin (ROBITUSSIN) 100 MG/5ML liquid Take 100 mg by mouth every 6 (six) hours as needed for cough. Not to exceed 4 doses in 24 hours      . HYDROcodone-acetaminophen (NORCO/VICODIN) 5-325 MG per tablet Take 1 tablet by mouth every 8 (eight) hours as needed for  moderate pain.      Marland Kitchen insulin glargine (LANTUS) 100 UNIT/ML injection Inject 41 Units into the skin at bedtime.      . insulin lispro (HUMALOG) 100 UNIT/ML injection Inject 7 Units into the skin once. CBG greater than 350=14 units      . lactose free nutrition (BOOST PLUS) LIQD Take 237 mLs by mouth 3 (three) times daily with meals.    0  . levothyroxine (SYNTHROID, LEVOTHROID) 50 MCG tablet Take 50 mcg by mouth every morning.       . loperamide (IMODIUM) 2 MG capsule Take 2 mg by mouth as needed for diarrhea or loose stools. Do not exceed 8 doses in 24 hours      . magnesium hydroxide (MILK OF MAGNESIA) 400 MG/5ML suspension Take 30 mLs by mouth daily as needed for mild constipation.      . Multiple Vitamins-Minerals (HEALTHY EYES) TABS Take 1 tablet by mouth every morning.       . pantoprazole (PROTONIX) 40 MG tablet Take 1 tablet (40 mg total) by mouth daily.  30 tablet  0  . polyethylene glycol (MIRALAX / GLYCOLAX) packet Take 17 g by mouth daily as needed for mild constipation, moderate constipation or severe constipation.       . promethazine (PHENERGAN) 25 MG tablet Take 25 mg by mouth every 6 (six) hours as needed for nausea or vomiting.      . rosuvastatin (CRESTOR) 20 MG tablet Take 20 mg by mouth every evening.       . senna-docusate (SENOKOT-S) 8.6-50 MG per tablet Take 2 tablets by mouth at bedtime.       . sucralfate (CARAFATE) 1 GM/10ML suspension Take 10 mLs (1 g total) by mouth every 6 (six) hours.  420 mL  0  . tiotropium (SPIRIVA) 18 MCG inhalation capsule Place 18 mcg into inhaler and inhale daily.      . traMADol (ULTRAM) 50 MG tablet Take 50 mg by mouth every 6 (six) hours as needed.      Marland Kitchen aspirin 81 MG chewable tablet Chew 81 mg by mouth daily.       No facility-administered medications prior to visit.    PHYSICAL EXAM Filed Vitals:   01/16/14 1409  BP: 137/69  Pulse: 70   Generalized: Well developed, in no acute distress, on EMS stretcher. Head: normocephalic and  atraumatic. Oropharynx benign  Neck: Supple, no carotid bruits  Cardiac: Regular rate rhythm, no murmur  Musculoskeletal: No deformity   Neurological examination  Mentation: Alert oriented to time, place, history taking. Follows all commands speech and language fluent Cranial nerve II-XII: Fundoscopic exam not done. Pupils were equal round reactive to light extraocular movements were full, visual field were full on confrontational test.Mild left lower face asymmetry. Hearing was intact to finger  rubbing bilaterally. Uvula tongue midline. Head turning and shoulder shrug and were normal and symmetric.Tongue protrusion into cheek strength was normal. Motor: Bilateral UE 5/5 strength, BLE 3/5 Mild diminished fine finger movements on left. Orbits right over left upper extremity. Mild left grip weak.  Sensory: Sensory testing is intact to soft touch on all 4 extremities. No evidence of extinction is noted.  Coordination: Cerebellar testing reveals good finger-nose-finger and heel-to-shin bilaterally.  Gait and station: Gait is not tested, she is on stretcher. Reflexes: Deep tendon reflexes are symmetric and normal bilaterally.   NIHSS: 0 MRs: 4  ASSESSMENT: [name] is a 79 y.o. female presenting with Left Hemiparesis, Left Face Weakness, Dysarthria. Imaging negative for acute infarct. Dx: Right brain TIA. Vascular risk factors of hypertension, Diabetes, uncontrolled, Hyperlipidemia, PAD, CKD, stage III, Smoker, Obesity, and poor functional status.  PLAN: I had a long discussion with the patient regarding her recent TIA, discussed results of evaluation in the hospital and answered questions. Switch from Aspirin 81 mg orally daily to  aspirin 325 mg orally every day  for secondary stroke prevention and maintain strict control of hypertension with blood pressure goal below 140/90, diabetes with hemoglobin A1c goal below 7% and lipids with LDL cholesterol goal below 70 mg/dL. Followup in the  future in 4 months with Dr. Erlinda Hong.  Orders Placed This Encounter  Procedures  . Ambulatory referral to [facility] for PT at [facility] ordered this encounter  Medications  . aspirin EC 325 MG tablet    Sig: Take 1 tablet (325 mg total) by mouth daily.    Dispense:  30 tablet    Refill:  6    Order Specific Question:  Supervising Provider    Answer:  Penni Bombard [[id number]]   Return in about 4 months (around 05/18/2014) for TIA follow up.  Rudi Rummage Natalin Bible, MSN, FNP-BC, A/GNP-C 01/18/2014, 12:51 PM [facility] [address] [place], [place] [address] [phone]  Note: This document was prepared with digital dictation and possible smart phrase technology. Any transcriptional errors that result from this process are unintentional.

## 2014-01-16 NOTE — Patient Instructions (Addendum)
Switch from Aspirin 81 mg orally daily to aspirin 325 mg orally every day  for secondary stroke prevention and maintain strict control of hypertension with blood pressure goal below 140/90, diabetes with hemoglobin A1c goal below 7% and lipids with LDL cholesterol goal below 70 mg/dL. Followup in the future in 4 months with Dr. Erlinda Hong.   Stroke Prevention Some medical conditions and behaviors are associated with an increased chance of having a stroke. You may prevent a stroke by making healthy choices and managing medical conditions. HOW CAN I REDUCE MY RISK OF HAVING A STROKE?   Stay physically active. Get at least 30 minutes of activity on most or all days.  Do not smoke. It may also be helpful to avoid exposure to secondhand smoke.  Limit alcohol use. Moderate alcohol use is considered to be:  No more than 2 drinks per day for men.  No more than 1 drink per day for nonpregnant women.  Eat healthy foods. This involves:  Eating 5 or more servings of fruits and vegetables a day.  Making dietary changes that address high blood pressure (hypertension), high cholesterol, diabetes, or obesity.  Manage your cholesterol levels.  Making food choices that are high in fiber and low in saturated fat, trans fat, and cholesterol may control cholesterol levels.  Take any prescribed medicines to control cholesterol as directed by your health care provider.  Manage your diabetes.  Controlling your carbohydrate and sugar intake is recommended to manage diabetes.  Take any prescribed medicines to control diabetes as directed by your health care provider.  Control your hypertension.  Making food choices that are low in salt (sodium), saturated fat, trans fat, and cholesterol is recommended to manage hypertension.  Take any prescribed medicines to control hypertension as directed by your health care provider.  Maintain a healthy weight.  Reducing calorie intake and making food choices that are  low in sodium, saturated fat, trans fat, and cholesterol are recommended to manage weight.  Stop drug abuse.  Avoid taking birth control pills.  Talk to your health care provider about the risks of taking birth control pills if you are over 70 years old, smoke, get migraines, or have ever had a blood clot.  Get evaluated for sleep disorders (sleep apnea).  Talk to your health care provider about getting a sleep evaluation if you snore a lot or have excessive sleepiness.  Take medicines only as directed by your health care provider.  For some people, aspirin or blood thinners (anticoagulants) are helpful in reducing the risk of forming abnormal blood clots that can lead to stroke. If you have the irregular heart rhythm of atrial fibrillation, you should be on a blood thinner unless there is a good reason you cannot take them.  Understand all your medicine instructions.  Make sure that other conditions (such as anemia or atherosclerosis) are addressed. SEEK IMMEDIATE MEDICAL CARE IF:   You have sudden weakness or numbness of the face, arm, or leg, especially on one side of the body.  Your face or eyelid droops to one side.  You have sudden confusion.  You have trouble speaking (aphasia) or understanding.  You have sudden trouble seeing in one or both eyes.  You have sudden trouble walking.  You have dizziness.  You have a loss of balance or coordination.  You have a sudden, severe headache with no known cause.  You have new chest pain or an irregular heartbeat. Any of these symptoms may represent a serious problem  that is an emergency. Do not wait to see if the symptoms will go away. Get medical help at once. Call your local emergency services (911 in [place] Do not drive yourself to the hospital. Document Released: 06/09/2004 Document Revised: 09/16/2013 Document Reviewed: 11/02/2012 [facility] Patient Information 2015 [place], [place]. This information is not intended to replace  advice given to you by your health care provider. Make sure you discuss any questions you have with your health care provider.

## 2014-01-19 NOTE — Progress Notes (Signed)
I agree with the above plan 

## 2014-03-16 ENCOUNTER — Encounter (HOSPITAL_COMMUNITY): Payer: Self-pay | Admitting: Emergency Medicine

## 2014-03-16 ENCOUNTER — Emergency Department (HOSPITAL_COMMUNITY): Payer: Medicare HMO

## 2014-03-16 ENCOUNTER — Emergency Department (HOSPITAL_COMMUNITY)
Admission: EM | Admit: 2014-03-16 | Discharge: 2014-03-17 | Disposition: A | Discharge status: Home / Self Care | Payer: Medicare HMO | Attending: Emergency Medicine | Admitting: Emergency Medicine

## 2014-03-16 DIAGNOSIS — Z7982 Long term (current) use of aspirin: Secondary | ICD-10-CM | POA: Insufficient documentation

## 2014-03-16 DIAGNOSIS — E1342 Other specified diabetes mellitus with diabetic polyneuropathy: Secondary | ICD-10-CM | POA: Diagnosis not present

## 2014-03-16 DIAGNOSIS — J449 Chronic obstructive pulmonary disease, unspecified: Secondary | ICD-10-CM | POA: Diagnosis not present

## 2014-03-16 DIAGNOSIS — I129 Hypertensive chronic kidney disease with stage 1 through stage 4 chronic kidney disease, or unspecified chronic kidney disease: Secondary | ICD-10-CM | POA: Diagnosis not present

## 2014-03-16 DIAGNOSIS — S0181XA Laceration without foreign body of other part of head, initial encounter: Secondary | ICD-10-CM

## 2014-03-16 DIAGNOSIS — I2510 Atherosclerotic heart disease of native coronary artery without angina pectoris: Secondary | ICD-10-CM | POA: Insufficient documentation

## 2014-03-16 DIAGNOSIS — Y9289 Other specified places as the place of occurrence of the external cause: Secondary | ICD-10-CM | POA: Insufficient documentation

## 2014-03-16 DIAGNOSIS — Z880 Allergy status to penicillin: Secondary | ICD-10-CM | POA: Insufficient documentation

## 2014-03-16 DIAGNOSIS — N189 Chronic kidney disease, unspecified: Secondary | ICD-10-CM | POA: Insufficient documentation

## 2014-03-16 DIAGNOSIS — E039 Hypothyroidism, unspecified: Secondary | ICD-10-CM | POA: Diagnosis not present

## 2014-03-16 DIAGNOSIS — Z720 Tobacco use: Secondary | ICD-10-CM | POA: Diagnosis not present

## 2014-03-16 DIAGNOSIS — Z79899 Other long term (current) drug therapy: Secondary | ICD-10-CM | POA: Diagnosis not present

## 2014-03-16 DIAGNOSIS — G629 Polyneuropathy, unspecified: Secondary | ICD-10-CM | POA: Diagnosis not present

## 2014-03-16 DIAGNOSIS — S022XXA Fracture of nasal bones, initial encounter for closed fracture: Secondary | ICD-10-CM | POA: Insufficient documentation

## 2014-03-16 DIAGNOSIS — E780 Pure hypercholesterolemia: Secondary | ICD-10-CM | POA: Insufficient documentation

## 2014-03-16 DIAGNOSIS — W19XXXA Unspecified fall, initial encounter: Secondary | ICD-10-CM

## 2014-03-16 DIAGNOSIS — Y9389 Activity, other specified: Secondary | ICD-10-CM | POA: Insufficient documentation

## 2014-03-16 DIAGNOSIS — Z794 Long term (current) use of insulin: Secondary | ICD-10-CM | POA: Diagnosis not present

## 2014-03-16 DIAGNOSIS — S01511A Laceration without foreign body of lip, initial encounter: Secondary | ICD-10-CM | POA: Insufficient documentation

## 2014-03-16 DIAGNOSIS — W01198A Fall on same level from slipping, tripping and stumbling with subsequent striking against other object, initial encounter: Secondary | ICD-10-CM | POA: Insufficient documentation

## 2014-03-16 MED ORDER — MORPHINE SULFATE 4 MG/ML IJ SOLN
4.0000 mg | Freq: Once | INTRAMUSCULAR | Status: DC
  Filled 2014-03-16: qty 1

## 2014-03-16 MED ORDER — MORPHINE SULFATE 4 MG/ML IJ SOLN
4.0000 mg | Freq: Once | INTRAMUSCULAR | Status: AC
Start: 2014-03-16 — End: 2014-03-16
  Administered 2014-03-16: 4 mg via INTRAMUSCULAR

## 2014-03-16 NOTE — ED Notes (Signed)
Per EMS: Pt from assisted living.  Went out to smoke.  Witnessed fall to face.  No thinners.  Through and through laceration to lip.  No LOC.

## 2014-03-16 NOTE — ED Notes (Addendum)
ER MD at bedside to suture lac to lower lip.

## 2014-03-16 NOTE — ED Notes (Signed)
Pt reported falling out of wc at ALF hitting face on floor. Pt reported top dentures broke into 2 pieces. Noted tooth from dentures embedded in bottom lip insides lac. No bleeding noted at this time. Family at bedside.

## 2014-03-17 ENCOUNTER — Encounter (HOSPITAL_COMMUNITY): Payer: Self-pay

## 2014-03-17 MED ORDER — OXYCODONE-ACETAMINOPHEN 5-325 MG PO TABS: 1.0000 | ORAL_TABLET | ORAL | Status: DC | PRN

## 2014-03-17 NOTE — Discharge Instructions (Signed)
Facial Fracture A facial fracture is a break in one of the bones of your face. HOME CARE INSTRUCTIONS   Protect the injured part of your face until it is healed.  Do not participate in activities which give chance for re-injury until your doctor approves.  Gently wash and dry your face.  Wear head and facial protection while riding a bicycle, motorcycle, or snowmobile. SEEK MEDICAL CARE IF:   An oral temperature above 102 F (38.9 C) develops.  You have severe headaches or notice changes in your vision.  You have new numbness or tingling in your face.  You develop nausea (feeling sick to your stomach), vomiting or a stiff neck. SEEK IMMEDIATE MEDICAL CARE IF:   You develop difficulty seeing or experience double vision.  You become dizzy, lightheaded, or faint.  You develop trouble speaking, breathing, or swallowing.  You have a watery discharge from your nose or ear. MAKE SURE YOU:   Understand these instructions.  Will watch your condition.  Will get help right away if you are not doing well or get worse. Document Released: 05/02/2005 Document Revised: 07/25/2011 Document Reviewed: 12/20/2007 [facility] Patient Information 2015 [place], [place]. This information is not intended to replace advice given to you by your health care provider. Make sure you discuss any questions you have with your health care provider.  Nasal Fracture A fracture is a break in the bone. A nasal fracture is a broken nose. Minor breaks do not need treatment. Serious breaks may need surgery.  HOME CARE  Put ice on the injured area.  Put ice in a plastic bag.  Place a towel between your skin and the bag.  Leave the ice on for 15-20 minutes, 03-04 times a day.  Only take medicine as told by your doctor.  If your nose bleeds, squeeze your nose shut gently. Sit upright for 10 minutes.  Do not play contact sports for 3 to 4 weeks or as told by your doctor. GET HELP RIGHT AWAY IF:   You have  more pain or severe pain.  You keep having nosebleeds.  The shape of your nose does not return to normal after 5 days.  You have yellowish white fluid (pus) coming from your nose.  Your nose bleeds for over 20 minutes.  Clear fluid drains from your nose.  You have a grape-like puffiness (swelling) on the inside of your nose.  You have trouble moving your eyes.  You keep throwing up (vomiting). MAKE SURE YOU:   Understand these instructions.  Will watch this condition.  Will get help right away if you are not doing well or get worse. Document Released: 02/09/2008 Document Revised: 07/25/2011 Document Reviewed: 08/16/2010 [facility] Patient Information 2015 [place], [place]. This information is not intended to replace advice given to you by your health care provider. Make sure you discuss any questions you have with your health care provider.

## 2014-03-17 NOTE — ED Provider Notes (Signed)
CSN: [id number]     Arrival date & time 03/16/14  1846 History   First MD Initiated Contact with Patient 03/16/14 2147     Chief Complaint  Patient presents with  . Fall     (Consider location/radiation/quality/duration/timing/severity/associated sxs/prior Treatment) HPI Comments: Pt took wheelchair outside to smoke, was leaning forward to to pick something off the ground and fell face first OO wheelchair onto cement, breaking her dentures, lacerating lip. No LOC. No current h/a.   Patient is a 79 y.o. female presenting with fall. The history is provided by the patient and a relative. No language interpreter was used.  Fall This is a new problem. The current episode started 1 to 2 hours ago. The problem occurs rarely. The problem has not changed since onset.Pertinent negatives include no chest pain, no abdominal pain, no headaches and no shortness of breath. Associated symptoms comments: Facial pain. Nothing aggravates the symptoms. Nothing relieves the symptoms. She has tried nothing for the symptoms. The treatment provided no relief.    Past Medical History  Diagnosis Date  . Hypertension   . Macular degeneration, bilateral   . Type II diabetes mellitus   . COPD (chronic obstructive pulmonary disease)   . Diabetic peripheral neuropathy   . Hypercholesteremia   . PAD (peripheral artery disease)   . Chronic kidney disease   . Headache(784.0)   . Hypothyroid   . Coronary artery disease    Past Surgical History  Procedure Laterality Date  . Tonsillectomy  1940's  . Abdominal hysterectomy  1970's  . Dilation and curettage of uterus  1970's    "probably 2" (11/01/2012)  . Cataract extraction w/ intraocular lens  implant, bilateral Bilateral ~ 2010  . Bypass graft Right ~ 1997    RLE by Dr. Gwenlyn Perking 11/29/2005 (11/01/2012)  . Cystostomy w/ bladder biopsy  2005    Archie Endo 09/19/2003 (11/01/2012)  . Anterior cervical decomp/discectomy fusion  01/02/2006    Archie Endo 01/02/2006 (11/01/2012)    . Shoulder open rotator cuff repair Bilateral 1990's-2000's    "3X on the left; twice on the right" (11/01/2012)  . Cardiac catheterization  11/29/2005    Archie Endo 11/29/2005 (11/01/2012)  . Esophagogastroduodenoscopy N/A 10/14/2013    Procedure: ESOPHAGOGASTRODUODENOSCOPY (EGD);  Surgeon: Missy Sabins, MD;  Location: [facility];  Service: Endoscopy;  Laterality: N/A;   No family history on file. History  Substance Use Topics  . Smoking status: Current Every Day Smoker -- 0.12 packs/day for 60 years    Types: Cigarettes  . Smokeless tobacco: Never Used  . Alcohol Use: No   OB History    No data available     Review of Systems  Constitutional: Negative for fever, chills, diaphoresis, activity change, appetite change and fatigue.  HENT: Negative for congestion, facial swelling, rhinorrhea and sore throat.   Eyes: Negative for photophobia and discharge.  Respiratory: Negative for cough, chest tightness and shortness of breath.   Cardiovascular: Negative for chest pain, palpitations and leg swelling.  Gastrointestinal: Negative for nausea, vomiting, abdominal pain and diarrhea.  Endocrine: Negative for polydipsia and polyuria.  Genitourinary: Negative for dysuria, frequency, difficulty urinating and pelvic pain.  Musculoskeletal: Negative for back pain, arthralgias, neck pain and neck stiffness.  Skin: Positive for wound. Negative for color change.  Allergic/Immunologic: Negative for immunocompromised state.  Neurological: Negative for facial asymmetry, weakness, numbness and headaches.  Hematological: Does not bruise/bleed easily.  Psychiatric/Behavioral: Negative for confusion and agitation.      Allergies  Codeine; Lisinopril; Penicillins;  Septra; and Sulfur  Home Medications   Prior to Admission medications   Medication Sig Start Date End Date Taking? Authorizing Provider  amitriptyline (ELAVIL) 50 MG tablet Take 50 mg by mouth at bedtime.   Yes Historical Provider, MD   amLODipine (NORVASC) 10 MG tablet Take 10 mg by mouth daily.    Yes Historical Provider, MD  aspirin 81 MG chewable tablet Chew 81 mg by mouth daily.   Yes Historical Provider, MD  budesonide-formoterol (SYMBICORT) 160-4.5 MCG/ACT inhaler Inhale 2 puffs into the lungs 2 (two) times daily.   Yes Historical Provider, MD  carvedilol (COREG) 25 MG tablet Take 25 mg by mouth 2 (two) times daily with a meal. 0800 and 1700   Yes Historical Provider, MD  cloNIDine (CATAPRES) 0.2 MG tablet Take 0.2 mg by mouth 2 (two) times daily. Hold if blood pressure less than 100/60   Yes Historical Provider, MD  furosemide (LASIX) 40 MG tablet Take 40 mg by mouth daily.   Yes Historical Provider, MD  gabapentin (NEURONTIN) 300 MG capsule Take 300 mg by mouth 3 (three) times daily.   Yes Historical Provider, MD  insulin glargine (LANTUS) 100 UNIT/ML injection Inject 43 Units into the skin at bedtime.    Yes Historical Provider, MD  insulin lispro (HUMALOG) 100 UNIT/ML injection Inject 6 Units into the skin 3 (three) times daily with meals. CBG greater than 350=14 units   Yes Historical Provider, MD  lactose free nutrition (BOOST PLUS) LIQD Take 237 mLs by mouth 3 (three) times daily with meals. 10/16/13  Yes Maryann Mikhail, DO  levothyroxine (SYNTHROID, LEVOTHROID) 50 MCG tablet Take 50 mcg by mouth every morning.    Yes Historical Provider, MD  Menthol-Zinc Oxide (RISAMINE) 0.44-20.625 % OINT Apply 1 application topically 3 (three) times daily.   Yes Historical Provider, MD  Multiple Vitamins-Minerals (HEALTHY EYES) TABS Take 1 tablet by mouth every morning.    Yes Historical Provider, MD  pantoprazole (PROTONIX) 40 MG tablet Take 1 tablet (40 mg total) by mouth daily. 10/16/13  Yes Maryann Mikhail, DO  polyethylene glycol (MIRALAX / GLYCOLAX) packet Take 17 g by mouth daily as needed for mild constipation, moderate constipation or severe constipation.    Yes Historical Provider, MD  rosuvastatin (CRESTOR) 20 MG tablet  Take 20 mg by mouth every evening.    Yes Historical Provider, MD  senna-docusate (SENOKOT-S) 8.6-50 MG per tablet Take 2 tablets by mouth at bedtime.    Yes Historical Provider, MD  tiotropium (SPIRIVA) 18 MCG inhalation capsule Place 18 mcg into inhaler and inhale daily.   Yes Historical Provider, MD  acetaminophen (TYLENOL) 500 MG tablet Take 500 mg by mouth every 4 (four) hours as needed for mild pain, fever or headache.    Historical Provider, MD  albuterol (PROVENTIL) (2.5 MG/3ML) 0.083% nebulizer solution Take 2.5 mg by nebulization every 6 (six) hours as needed for wheezing.    Historical Provider, MD  ALPRAZolam Duanne Moron) 0.25 MG tablet Take 1 tablet (0.25 mg total) by mouth 2 (two) times daily as needed for anxiety. 10/16/13   Maryann Mikhail, DO  alum & mag hydroxide-simeth (MAALOX/MYLANTA) 200-200-20 MG/5ML suspension Take 30 mLs by mouth every 6 (six) hours as needed for indigestion or heartburn. Do not exceed 4 doses in 24 hours    Historical Provider, MD  aspirin EC 325 MG tablet Take 1 tablet (325 mg total) by mouth daily. 01/16/14   Philmore Pali, NP  ciprofloxacin (CIPRO) 500 MG tablet Take 1  tablet (500 mg total) by mouth 2 (two) times daily. 11/22/13   Kalman Drape, MD  collagenase (SANTYL) ointment Apply 1 application topically daily. Apply for heel changes 3 times per week.    Historical Provider, MD  gabapentin (NEURONTIN) 100 MG capsule Take 200 mg by mouth 3 (three) times daily.    Historical Provider, MD  guaiFENesin (ROBITUSSIN) 100 MG/5ML liquid Take 100 mg by mouth every 6 (six) hours as needed for cough. Not to exceed 4 doses in 24 hours    Historical Provider, MD  HYDROcodone-acetaminophen (NORCO/VICODIN) 5-325 MG per tablet Take 1 tablet by mouth every 8 (eight) hours as needed for moderate pain.    Historical Provider, MD  loperamide (IMODIUM) 2 MG capsule Take 2 mg by mouth as needed for diarrhea or loose stools. Do not exceed 8 doses in 24 hours    Historical Provider, MD   magnesium hydroxide (MILK OF MAGNESIA) 400 MG/5ML suspension Take 30 mLs by mouth daily as needed for mild constipation.    Historical Provider, MD  promethazine (PHENERGAN) 25 MG tablet Take 25 mg by mouth every 6 (six) hours as needed for nausea or vomiting.    Historical Provider, MD  sucralfate (CARAFATE) 1 GM/10ML suspension Take 10 mLs (1 g total) by mouth every 6 (six) hours. 10/16/13   Maryann Mikhail, DO  traMADol (ULTRAM) 50 MG tablet Take 50 mg by mouth every 6 (six) hours as needed.    Historical Provider, MD  trimethoprim (TRIMPEX) 100 MG tablet  12/06/13   Historical Provider, MD   BP 159/51 mmHg  Pulse 67  Temp(Src) 98.8 F (37.1 C) (Oral)  Resp 18  SpO2 98% Physical Exam  Constitutional: She is oriented to person, place, and time. She appears well-developed and well-nourished. No distress.  HENT:  Head: Normocephalic and atraumatic.    Mouth/Throat: Lacerations present. No oropharyngeal exudate.    Edentulous   Eyes: Pupils are equal, round, and reactive to light.  Neck: Normal range of motion. Neck supple.  Cardiovascular: Normal rate, regular rhythm and normal heart sounds.  Exam reveals no gallop and no friction rub.   No murmur heard. Pulmonary/Chest: Effort normal and breath sounds normal. No respiratory distress. She has no wheezes. She has no rales.  Abdominal: Soft. Bowel sounds are normal. She exhibits no distension and no mass. There is no tenderness. There is no rebound and no guarding.  Musculoskeletal: Normal range of motion. She exhibits no edema.       Left knee: Tenderness found. Medial joint line and patellar tendon tenderness noted.       Left ankle: Tenderness. Head of 5th metatarsal tenderness found.  Neurological: She is alert and oriented to person, place, and time. She has normal strength. No cranial nerve deficit or sensory deficit. Coordination normal. GCS eye subscore is 4. GCS verbal subscore is 5. GCS motor subscore is 6.  Skin: Skin is warm  and dry.  Psychiatric: She has a normal mood and affect.    ED Course  LACERATION REPAIR Date/Time: 03/17/2014 12:01 AM Performed by: Ernestina Patches Authorized by: Ernestina Patches Consent: Verbal consent obtained. Written consent not obtained. Risks and benefits: risks, benefits and alternatives were discussed Consent given by: patient Patient understanding: patient states understanding of the procedure being performed Patient consent: the patient's understanding of the procedure matches consent given Procedure consent: procedure consent matches procedure scheduled Relevant documents: relevant documents present and verified Test results: test results available and properly labeled Site marked: the  operative site was marked Imaging studies: imaging studies available Required items: required blood products, implants, devices, and special equipment available Patient identity confirmed: verbally with patient Time out: Immediately prior to procedure a "time out" was called to verify the correct patient, procedure, equipment, support staff and site/side marked as required. Body area: head/neck Location details: lower lip Full thickness lip laceration: no Vermillion border involved: yes Lip laceration height: vermillion only Laceration length: 2 cm Tendon involvement: none Nerve involvement: none Vascular damage: no Anesthesia: local infiltration Local anesthetic: lidocaine 1% without epinephrine Anesthetic total: 1 ml Patient sedated: no Preparation: Patient was prepped and draped in the usual sterile fashion. Irrigation solution: saline Irrigation method: syringe Amount of cleaning: standard Debridement: none Degree of undermining: none Mucous membrane closure: 6-0 fast-absorbing plain gut   (including critical care time) Labs Review Labs Reviewed - No data to display  Imaging Review Dg Knee Complete 4 Views Left  03/16/2014   CLINICAL DATA:  Patient fell out of wheelchair  on 03/16/2014. Pain entire knee with swelling at the patellar region.  EXAM: LEFT KNEE - COMPLETE 4+ VIEW  COMPARISON:  None.  FINDINGS: Diffuse bone demineralization. Degenerative changes in the left knee with medial compartment narrowing and chondrocalcinosis. Small effusion. No evidence of acute fracture or dislocation. No focal bone lesion or bone destruction. Bone cortex and trabecular architecture appear intact. Vascular calcifications.  IMPRESSION: Diffuse bone demineralization. Small left knee effusion. No displaced fractures identified. Degenerative changes in the left knee.   Electronically Signed   By: Lucienne Capers M.D.   On: 03/16/2014 23:46   Dg Foot Complete Left  03/16/2014   CLINICAL DATA:  Patient fell out of wheelchair a nursing home 03/16/2014. Pain anterior surface of foot across the metatarsals.  EXAM: LEFT FOOT - COMPLETE 3+ VIEW  COMPARISON:  10/09/2013  FINDINGS: Diffuse bone demineralization. No acute fracture or dislocation is suggested. Diffuse vascular calcifications.  IMPRESSION: Diffuse bone demineralization without evidence of displaced fracture.   Electronically Signed   By: Lucienne Capers M.D.   On: 03/16/2014 23:44     EKG Interpretation None      MDM   Final diagnoses:  Fall from standing, initial encounter    Pt is a 79 y.o. female with Pmhx as above who presents with facial contusions, lip laceration after fall OO wheelchair onto cement just PTA. NO anticoagulation. No LOC, no AMS. No focal neuro findings on PE. She has nasal contusion, lower lip lac involving vermilion border. Lac repeaired as above. XR L knee & ankle negative. Dr. Wilson Singer will f/u on CT head, c-spine and face. If neg, can d/c home.         Ernestina Patches, MD 03/17/14 1850

## 2014-03-17 NOTE — ED Notes (Signed)
EMS arrived to transport pt back to facility. Attempted to call [facility] but no answer.

## 2014-03-17 NOTE — ED Notes (Addendum)
error 

## 2014-03-17 NOTE — ED Notes (Signed)
Pt resting quietly with eyes closed. Easily arousable. Verbally responsive. Resp even and unlabored. ABC's intact. Noted 3 stitches to lower lip. No bleeding noted. NAD

## 2014-03-17 NOTE — ED Notes (Signed)
Awake. Verbally responsive. Resp even and unlabored. ABC's intact.

## 2014-05-18 ENCOUNTER — Emergency Department (INDEPENDENT_AMBULATORY_CARE_PROVIDER_SITE_OTHER)
Admission: EM | Admit: 2014-05-18 | Discharge: 2014-05-18 | Disposition: A | Discharge status: Home / Self Care | Payer: Medicare HMO | Source: Home / Self Care | Attending: Family Medicine | Admitting: Family Medicine

## 2014-05-18 ENCOUNTER — Encounter (HOSPITAL_COMMUNITY): Payer: Self-pay | Admitting: Emergency Medicine

## 2014-05-18 DIAGNOSIS — Z23 Encounter for immunization: Secondary | ICD-10-CM

## 2014-05-18 DIAGNOSIS — S51012A Laceration without foreign body of left elbow, initial encounter: Secondary | ICD-10-CM

## 2014-05-18 MED ORDER — TETANUS-DIPHTH-ACELL PERTUSSIS 5-2.5-18.5 LF-MCG/0.5 IM SUSP
0.5000 mL | Freq: Once | INTRAMUSCULAR | Status: AC
  Administered 2014-05-18: 0.5 mL via INTRAMUSCULAR

## 2014-05-18 MED ORDER — TETANUS-DIPHTH-ACELL PERTUSSIS 5-2.5-18.5 LF-MCG/0.5 IM SUSP
INTRAMUSCULAR | Status: AC
  Filled 2014-05-18: qty 0.5

## 2014-05-18 MED ORDER — MUPIROCIN 2 % EX OINT: 1.0000 "application " | TOPICAL_OINTMENT | Freq: Two times a day (BID) | CUTANEOUS | Status: DC

## 2014-05-18 NOTE — ED Provider Notes (Signed)
[name] is a 80 y.o. female who presents to Urgent Care today for skin tear.  Patient suffered a skin tear on her left dorsal hand this morning. It was her daughter's dog. The dog is up-to-date with vaccines. Patient cannot recall her last tetanus vaccination. She denies any significant fevers chills radiating pain weakness or numbness. She notes mild pain.   Past Medical History  Diagnosis Date  . Hypertension   . Macular degeneration, bilateral   . Type II diabetes mellitus   . COPD (chronic obstructive pulmonary disease)   . Diabetic peripheral neuropathy   . Hypercholesteremia   . PAD (peripheral artery disease)   . Chronic kidney disease   . Headache(784.0)   . Hypothyroid   . Coronary artery disease    Past Surgical History  Procedure Laterality Date  . Tonsillectomy  1940's  . Abdominal hysterectomy  1970's  . Dilation and curettage of uterus  1970's    "probably 2" (11/01/2012)  . Cataract extraction w/ intraocular lens  implant, bilateral Bilateral ~ 2010  . Bypass graft Right ~ 1997    RLE by Dr. Gwenlyn Perking 11/29/2005 (11/01/2012)  . Cystostomy w/ bladder biopsy  2005    Archie Endo 09/19/2003 (11/01/2012)  . Anterior cervical decomp/discectomy fusion  01/02/2006    Archie Endo 01/02/2006 (11/01/2012)  . Shoulder open rotator cuff repair Bilateral 1990's-2000's    "3X on the left; twice on the right" (11/01/2012)  . Cardiac catheterization  11/29/2005    Archie Endo 11/29/2005 (11/01/2012)  . Esophagogastroduodenoscopy N/A 10/14/2013    Procedure: ESOPHAGOGASTRODUODENOSCOPY (EGD);  Surgeon: Missy Sabins, MD;  Location: [facility];  Service: Endoscopy;  Laterality: N/A;   History  Substance Use Topics  . Smoking status: Current Every Day Smoker -- 0.12 packs/day for 60 years    Types: Cigarettes  . Smokeless tobacco: Never Used  . Alcohol Use: No   ROS as above Medications: Current Facility-Administered Medications  Medication Dose Route Frequency Provider Last Rate Last Dose  .  Tdap (BOOSTRIX) injection 0.5 mL  0.5 mL Intramuscular Once Gregor Hams, MD       Current Outpatient Prescriptions  Medication Sig Dispense Refill  . acetaminophen (TYLENOL) 500 MG tablet Take 500 mg by mouth every 4 (four) hours as needed for mild pain, fever or headache.    . albuterol (PROVENTIL) (2.5 MG/3ML) 0.083% nebulizer solution Take 2.5 mg by nebulization every 6 (six) hours as needed for wheezing.    Marland Kitchen ALPRAZolam (XANAX) 0.25 MG tablet Take 1 tablet (0.25 mg total) by mouth 2 (two) times daily as needed for anxiety. 30 tablet 0  . alum & mag hydroxide-simeth (MAALOX/MYLANTA) 200-200-20 MG/5ML suspension Take 30 mLs by mouth every 6 (six) hours as needed for indigestion or heartburn. Do not exceed 4 doses in 24 hours    . amitriptyline (ELAVIL) 50 MG tablet Take 50 mg by mouth at bedtime.    Marland Kitchen amLODipine (NORVASC) 10 MG tablet Take 10 mg by mouth daily.     Marland Kitchen aspirin 81 MG chewable tablet Chew 81 mg by mouth daily.    Marland Kitchen aspirin EC 325 MG tablet Take 1 tablet (325 mg total) by mouth daily. 30 tablet 6  . budesonide-formoterol (SYMBICORT) 160-4.5 MCG/ACT inhaler Inhale 2 puffs into the lungs 2 (two) times daily.    . carvedilol (COREG) 25 MG tablet Take 25 mg by mouth 2 (two) times daily with a meal. 0800 and 1700    . ciprofloxacin (CIPRO) 500 MG tablet Take  1 tablet (500 mg total) by mouth 2 (two) times daily. 20 tablet 0  . cloNIDine (CATAPRES) 0.2 MG tablet Take 0.2 mg by mouth 2 (two) times daily. Hold if blood pressure less than 100/60    . collagenase (SANTYL) ointment Apply 1 application topically daily. Apply for heel changes 3 times per week.    . furosemide (LASIX) 40 MG tablet Take 40 mg by mouth daily.    Marland Kitchen gabapentin (NEURONTIN) 100 MG capsule Take 200 mg by mouth 3 (three) times daily.    Marland Kitchen gabapentin (NEURONTIN) 300 MG capsule Take 300 mg by mouth 3 (three) times daily.    Marland Kitchen guaiFENesin (ROBITUSSIN) 100 MG/5ML liquid Take 100 mg by mouth every 6 (six) hours as needed for  cough. Not to exceed 4 doses in 24 hours    . HYDROcodone-acetaminophen (NORCO/VICODIN) 5-325 MG per tablet Take 1 tablet by mouth every 8 (eight) hours as needed for moderate pain.    Marland Kitchen insulin glargine (LANTUS) 100 UNIT/ML injection Inject 43 Units into the skin at bedtime.     . insulin lispro (HUMALOG) 100 UNIT/ML injection Inject 6 Units into the skin 3 (three) times daily with meals. CBG greater than 350=14 units    . lactose free nutrition (BOOST PLUS) LIQD Take 237 mLs by mouth 3 (three) times daily with meals.  0  . levothyroxine (SYNTHROID, LEVOTHROID) 50 MCG tablet Take 50 mcg by mouth every morning.     . loperamide (IMODIUM) 2 MG capsule Take 2 mg by mouth as needed for diarrhea or loose stools. Do not exceed 8 doses in 24 hours    . magnesium hydroxide (MILK OF MAGNESIA) 400 MG/5ML suspension Take 30 mLs by mouth daily as needed for mild constipation.    . Menthol-Zinc Oxide (RISAMINE) 0.44-20.625 % OINT Apply 1 application topically 3 (three) times daily.    . Multiple Vitamins-Minerals (HEALTHY EYES) TABS Take 1 tablet by mouth every morning.     . mupirocin ointment (BACTROBAN) 2 % Apply 1 application topically 2 (two) times daily. X 1 week 30 g 0  . oxyCODONE-acetaminophen (PERCOCET/ROXICET) 5-325 MG per tablet Take 1-2 tablets by mouth every 4 (four) hours as needed for severe pain. 20 tablet 0  . pantoprazole (PROTONIX) 40 MG tablet Take 1 tablet (40 mg total) by mouth daily. 30 tablet 0  . polyethylene glycol (MIRALAX / GLYCOLAX) packet Take 17 g by mouth daily as needed for mild constipation, moderate constipation or severe constipation.     . promethazine (PHENERGAN) 25 MG tablet Take 25 mg by mouth every 6 (six) hours as needed for nausea or vomiting.    . rosuvastatin (CRESTOR) 20 MG tablet Take 20 mg by mouth every evening.     . senna-docusate (SENOKOT-S) 8.6-50 MG per tablet Take 2 tablets by mouth at bedtime.     . sucralfate (CARAFATE) 1 GM/10ML suspension Take 10 mLs  (1 g total) by mouth every 6 (six) hours. 420 mL 0  . tiotropium (SPIRIVA) 18 MCG inhalation capsule Place 18 mcg into inhaler and inhale daily.    . traMADol (ULTRAM) 50 MG tablet Take 50 mg by mouth every 6 (six) hours as needed.    . trimethoprim (TRIMPEX) 100 MG tablet      Allergies  Allergen Reactions  . Codeine Other (See Comments)    unknown  . Lisinopril Other (See Comments)    unknown  . Penicillins Hives  . Septra [Sulfamethoxazole-Trimethoprim] Hives and Rash  . Sulfur Hives  and Rash    Break out      Exam:  BP 95/63 mmHg  Pulse 62  Temp(Src) 97.9 F (36.6 C) (Oral)  Resp 12  SpO2 97% Gen: Well NAD Left dorsal hand superficial skin tear. Strength motion sensation and pulses are intact distally.   No results found for this or any previous visit (from the past 24 hour(s)). No results found.  Assessment and Plan: 80 y.o. female with skin tear left hand. Wound irrigated and treated with antibiotic dressing. Prescribe mupirocin and follow-up with PCP. Tetanus vaccination provided today prior to discharge.  Discussed warning signs or symptoms. Please see discharge instructions. Patient expresses understanding.     Gregor Hams, MD 05/18/14 [id number]

## 2014-05-18 NOTE — Discharge Instructions (Signed)
Thank you for coming in today. Keep the wound clean and covered appointment for one week Return as needed Follow-up with primary care provider Tetanus vaccine administered today  Skin Tear Care A skin tear is a wound in which the top layer of skin has peeled off. This is a common problem with aging because the skin becomes thinner and more fragile as a person gets older. In addition, some medicines, such as oral corticosteroids, can lead to skin thinning if taken for long periods of time.  A skin tear is often repaired with tape or skin adhesive strips. This keeps the skin that has been peeled off in contact with the healthier skin beneath. Depending on the location of the wound, a bandage (dressing) may be applied over the tape or skin adhesive strips. Sometimes, during the healing process, the skin turns black and dies. Even when this happens, the torn skin acts as a good dressing until the skin underneath gets healthier and repairs itself. HOME CARE INSTRUCTIONS   Change dressings once per day or as directed by your caregiver.  Gently clean the skin tear and the area around the tear using saline solution or mild soap and water.  Do not rub the injured skin dry. Let the area air dry.  Apply petroleum jelly or an antibiotic cream or ointment to keep the tear moist. This will help the wound heal. Do not allow a scab to form.  If the dressing sticks before the next dressing change, moisten it with warm soapy water and gently remove it.  Protect the injured skin until it has healed.  Only take over-the-counter or prescription medicines as directed by your caregiver.  Take showers or baths using warm soapy water. Apply a new dressing after the shower or bath.  Keep all follow-up appointments as directed by your caregiver.  SEEK IMMEDIATE MEDICAL CARE IF:   You have redness, swelling, or increasing pain in the skin tear.  You havepus coming from the skin tear.  You have chills.  You  have a red streak that goes away from the skin tear.  You have a bad smell coming from the tear or dressing.  You have a fever or persistent symptoms for more than 2-3 days.  You have a fever and your symptoms suddenly get worse. MAKE SURE YOU:  Understand these instructions.  Will watch this condition.  Will get help right away if your child is not doing well or gets worse. Document Released: 01/25/2001 Document Revised: 01/25/2012 Document Reviewed: 11/14/2011 [facility] Patient Information 2015 [place], [place]. This information is not intended to replace advice given to you by your health care provider. Make sure you discuss any questions you have with your health care provider.

## 2014-05-18 NOTE — ED Notes (Signed)
Patient c/o dog scratch to her right hand today. Patient reports she did not know the dog but it was not a stray. She has a tear on her skin. Bleeding has been controlled. Patient is in NAD.

## 2014-05-21 ENCOUNTER — Telehealth: Payer: Self-pay

## 2014-05-21 DIAGNOSIS — I739 Peripheral vascular disease, unspecified: Secondary | ICD-10-CM

## 2014-05-21 DIAGNOSIS — Z959 Presence of cardiac and vascular implant and graft, unspecified: Secondary | ICD-10-CM

## 2014-05-21 DIAGNOSIS — Z95828 Presence of other vascular implants and grafts: Secondary | ICD-10-CM

## 2014-05-21 NOTE — Telephone Encounter (Signed)
Pt's relative called to report that pt. has a lot of fluid in her legs, from feet up to thigh.  Stated she resides at [facility], and sits in a w/c most of the time, with her legs hanging down.  Reported she doesn't walk or stand.  Verb. dissatisfaction about the medical care she has received at [facility].   Stated "I think she needs to see a heart specialist."  Relative stated "the swelling has been going on for quite awhile."  Unsure about any SOB, when questioned.  Advised that pt. should make appt. with her cardiologist.  Also, advised that pt. hasn't had her PVD and BPG evaluated for several years; will call back with appt. for vascular studies and office appt. with Dr. Donnetta Hutching.  Verb. Understanding.  Stated she will contact Dr. Kyla Balzarine office to schedule appt.

## 2014-05-26 ENCOUNTER — Ambulatory Visit: Payer: Medicare Other | Admitting: Neurology

## 2014-06-12 ENCOUNTER — Encounter: Payer: Self-pay | Admitting: Nurse Practitioner

## 2014-06-12 ENCOUNTER — Ambulatory Visit (INDEPENDENT_AMBULATORY_CARE_PROVIDER_SITE_OTHER): Payer: Medicare HMO | Admitting: Nurse Practitioner

## 2014-06-12 ENCOUNTER — Encounter: Payer: Medicare HMO | Admitting: Cardiovascular Disease

## 2014-06-12 VITALS — BP 100/60 | HR 61 | Ht 63.0 in | Wt 196.0 lb

## 2014-06-12 DIAGNOSIS — R600 Localized edema: Secondary | ICD-10-CM

## 2014-06-12 DIAGNOSIS — I739 Peripheral vascular disease, unspecified: Secondary | ICD-10-CM

## 2014-06-12 DIAGNOSIS — I10 Essential (primary) hypertension: Secondary | ICD-10-CM

## 2014-06-12 DIAGNOSIS — Z720 Tobacco use: Secondary | ICD-10-CM

## 2014-06-12 DIAGNOSIS — E785 Hyperlipidemia, unspecified: Secondary | ICD-10-CM

## 2014-06-12 DIAGNOSIS — I519 Heart disease, unspecified: Secondary | ICD-10-CM

## 2014-06-12 NOTE — Progress Notes (Signed)
Patient Name: [name] Date of Encounter: 06/12/2014  Primary Care Provider:  [facility] staff @ [facility] Primary Cardiologist:  P. Johnsie Cancel, MD   Chief Complaint  80 year old female presents today from [facility] assisted living facility due to complaints of bilateral lower extremity edema.    Past Medical History   Past Medical History  Diagnosis Date  . Hypertension   . Macular degeneration, bilateral   . Type II diabetes mellitus   . COPD (chronic obstructive pulmonary disease)   . Diabetic peripheral neuropathy   . Hypercholesteremia   . PAD (peripheral artery disease)     a. 1997 s/p R fem-pop bypass;  b. 03/1999 R CIA stenting.  . CKD (chronic kidney disease), stage III   . Headache(784.0)   . Hypothyroid   . Non-obstructive CAD     a. 11/2005 Cath: LM nl, LAD 30 diffuse, LCX small, 20p, 76m/d, 75d into OM, RCA dominant, 40p, 30-40 diffuse, EF 55%.  Marland Kitchen TIA (transient ischemic attack)     a. 09/2013.  . H/O echocardiogram     a. 09/2013 Echo: nl LV fxn, no rwma, mildly to mod dil LA, mildly dil RA.  . Carotid arterial disease     a. 09/2013 Carotid U/S: 40-59% bilat ICA stenosis.  . Enterococcus UTI     a. 09/2013.  . Tobacco abuse   . Cholelithiasis    Past Surgical History  Procedure Laterality Date  . Tonsillectomy  1940's  . Abdominal hysterectomy  1970's  . Dilation and curettage of uterus  1970's    "probably 2" (11/01/2012)  . Cataract extraction w/ intraocular lens  implant, bilateral Bilateral ~ 2010  . Bypass graft Right ~ 1997    RLE by Dr. Gwenlyn Perking 11/29/2005 (11/01/2012)  . Cystostomy w/ bladder biopsy  2005    Archie Endo 09/19/2003 (11/01/2012)  . Anterior cervical decomp/discectomy fusion  01/02/2006    Archie Endo 01/02/2006 (11/01/2012)  . Shoulder open rotator cuff repair Bilateral 1990's-2000's    "3X on the left; twice on the right" (11/01/2012)  . Cardiac catheterization  11/29/2005    Archie Endo 11/29/2005 (11/01/2012)  . Esophagogastroduodenoscopy  N/A 10/14/2013    Procedure: ESOPHAGOGASTRODUODENOSCOPY (EGD);  Surgeon: Missy Sabins, MD;  Location: [facility];  Service: Endoscopy;  Laterality: N/A;    Allergies  Allergies  Allergen Reactions  . Codeine Other (See Comments)    unknown  . Lisinopril Other (See Comments)    unknown  . Penicillins Hives  . Septra [Sulfamethoxazole-Trimethoprim] Hives and Rash  . Sulfur Hives and Rash    Break out     HPI  80 year old female with the above complex problem list.  presents today, due to a call into the office from family concerned about the swelling in her bilateral lower extremities. Cardiac history is positive for hypertension, non-obstructive CAD, DMII, CKD III, tobacco abuse, obesity, and PAD. In may of last year, she was admitted to [facility] with a TIA. Echo showed nl LV fxn and MRI was nl, while MRA showed diffuse nonobstructive cerebrovascular dzs.  She was readmitted in late May/June for abd pain and was found to have cholelithiasis.  She was seen by our team who felt that surgical risk was high given her multiple comorbidities.  She was conservatively managed and subsequently d/c'd back to SNF, where she has been staying for > 2 yrs.  She is wheelchair bound and has not walked in over 8 mos though SNF staff is planning on  getting her up again and reactivating physical therapy.  She sits just about all day long.  She will sometimes raise her legs when it is possible/depedning on where she is sitting.  In that setting, she has chronic lower ext edema despite taking lasix 40 mg daily.  She does not have c/p, dyspnea, pnd, orthopnea, n, v, dizziness, syncope, or edema.  She does report chronic bilat lower leg pain, which she attributes to diabetic neuropathy and arthritis.  She denies claudication but again, does not exert.  She was recently ordered TED hose and since these were placed, her lower ext edema has significantly improved.  Her dtr is with her today and notes that SNF staff does not  always put her TED hose on b/c it's difficult for them.  Home Medications  Prior to Admission medications   Medication Sig Start Date End Date Taking? Authorizing Provider  amitriptyline (ELAVIL) 50 MG tablet Take 50 mg by mouth at bedtime.   Yes Historical Provider, MD  amLODipine (NORVASC) 10 MG tablet Take 10 mg by mouth daily.    Yes Historical Provider, MD  aspirin 81 MG chewable tablet Chew 81 mg by mouth daily.   Yes Historical Provider, MD  carvedilol (COREG) 25 MG tablet Take 25 mg by mouth 2 (two) times daily with a meal. 0800 and 1700   Yes Historical Provider, MD  ciprofloxacin (CIPRO) 500 MG tablet Take 1 tablet (500 mg total) by mouth 2 (two) times daily. 11/22/13  Yes Kalman Drape, MD  cloNIDine (CATAPRES) 0.2 MG tablet Take 0.2 mg by mouth 2 (two) times daily. Hold if blood pressure less than 100/60   Yes Historical Provider, MD  escitalopram (LEXAPRO) 10 MG tablet Take 10 mg by mouth daily.   Yes Historical Provider, MD  furosemide (LASIX) 40 MG tablet Take 40 mg by mouth daily.   Yes Historical Provider, MD  HYDROcodone-acetaminophen (NORCO/VICODIN) 5-325 MG per tablet Take 1 tablet by mouth every 8 (eight) hours as needed for moderate pain.   Yes Historical Provider, MD  insulin glargine (LANTUS) 100 UNIT/ML injection Inject 43 Units into the skin at bedtime.    Yes Historical Provider, MD  insulin lispro (HUMALOG) 100 UNIT/ML injection Inject 6 Units into the skin 3 (three) times daily with meals. CBG greater than 350=14 units   Yes Historical Provider, MD  lactose free nutrition (BOOST PLUS) LIQD Take 237 mLs by mouth 3 (three) times daily with meals. 10/16/13  Yes Maryann Mikhail, DO  levothyroxine (SYNTHROID, LEVOTHROID) 50 MCG tablet Take 50 mcg by mouth every morning.    Yes Historical Provider, MD  loperamide (IMODIUM) 2 MG capsule Take 2 mg by mouth as needed for diarrhea or loose stools. Do not exceed 8 doses in 24 hours   Yes Historical Provider, MD  loratadine  (CLARITIN) 10 MG tablet Take 10 mg by mouth daily.   Yes Historical Provider, MD  magnesium hydroxide (MILK OF MAGNESIA) 400 MG/5ML suspension Take 30 mLs by mouth daily as needed for mild constipation.   Yes Historical Provider, MD  Menthol-Zinc Oxide (RISAMINE) 0.44-20.625 % OINT Apply 1 application topically 3 (three) times daily.   Yes Historical Provider, MD  Multiple Vitamins-Minerals (HEALTHY EYES) TABS Take 1 tablet by mouth every morning.    Yes Historical Provider, MD  mupirocin ointment (BACTROBAN) 2 % Apply 1 application topically 2 (two) times daily. X 1 week 05/18/14  Yes Gregor Hams, MD  ondansetron (ZOFRAN) 8 MG tablet Take by mouth  every 8 (eight) hours as needed for nausea or vomiting.   Yes Historical Provider, MD  oxyCODONE-acetaminophen (PERCOCET/ROXICET) 5-325 MG per tablet Take 1-2 tablets by mouth every 4 (four) hours as needed for severe pain. 03/17/14  Yes Virgel Manifold, MD  pantoprazole (PROTONIX) 40 MG tablet Take 1 tablet (40 mg total) by mouth daily. 10/16/13  Yes Maryann Mikhail, DO  polyethylene glycol (MIRALAX / GLYCOLAX) packet Take 17 g by mouth daily as needed for mild constipation, moderate constipation or severe constipation.    Yes Historical Provider, MD  promethazine (PHENERGAN) 25 MG tablet Take 25 mg by mouth every 6 (six) hours as needed for nausea or vomiting.   Yes Historical Provider, MD  rosuvastatin (CRESTOR) 20 MG tablet Take 20 mg by mouth every evening.    Yes Historical Provider, MD  senna-docusate (SENOKOT-S) 8.6-50 MG per tablet Take 2 tablets by mouth at bedtime.    Yes Historical Provider, MD  sucralfate (CARAFATE) 1 GM/10ML suspension Take 10 mLs (1 g total) by mouth every 6 (six) hours. 10/16/13  Yes Maryann Mikhail, DO  tiotropium (SPIRIVA) 18 MCG inhalation capsule Place 18 mcg into inhaler and inhale daily.   Yes Historical Provider, MD  trimethoprim (TRIMPEX) 100 MG tablet  12/06/13  Yes Historical Provider, MD  acetaminophen (TYLENOL) 500 MG  tablet Take 500 mg by mouth every 4 (four) hours as needed for mild pain, fever or headache.    Historical Provider, MD  albuterol (PROVENTIL) (2.5 MG/3ML) 0.083% nebulizer solution Take 2.5 mg by nebulization every 6 (six) hours as needed for wheezing.    Historical Provider, MD  ALPRAZolam Duanne Moron) 0.25 MG tablet Take 1 tablet (0.25 mg total) by mouth 2 (two) times daily as needed for anxiety. 10/16/13   Maryann Mikhail, DO  alum & mag hydroxide-simeth (MAALOX/MYLANTA) 200-200-20 MG/5ML suspension Take 30 mLs by mouth every 6 (six) hours as needed for indigestion or heartburn. Do not exceed 4 doses in 24 hours    Historical Provider, MD  aspirin EC 325 MG tablet Take 1 tablet (325 mg total) by mouth daily. 01/16/14   Philmore Pali, NP  budesonide-formoterol (SYMBICORT) 160-4.5 MCG/ACT inhaler Inhale 2 puffs into the lungs 2 (two) times daily.    Historical Provider, MD  collagenase (SANTYL) ointment Apply 1 application topically daily. Apply for heel changes 3 times per week.    Historical Provider, MD  gabapentin (NEURONTIN) 300 MG capsule Take 300 mg by mouth 3 (three) times daily.    Historical Provider, MD  guaiFENesin (ROBITUSSIN) 100 MG/5ML liquid Take 100 mg by mouth every 6 (six) hours as needed for cough. Not to exceed 4 doses in 24 hours    Historical Provider, MD   Review of Systems  She states she does have swelling to her BLE. She denies chest pain, palpitations, dyspnea, orthopnea, n, v, dizziness, syncope, or early satiety.  She has had wt gain, which seems to fluctuate with lower ext edema. All other systems reviewed and are otherwise negative except as noted above.  Physical Exam  VS:  BP 100/60 mmHg  Pulse 61  Ht 5\' 3"  (1.6 m)  Wt 196 lb (88.905 kg)  BMI 34.73 kg/m2 , BMI Body mass index is 34.73 kg/(m^2). GEN: Well nourished, well developed, in no acute distress. HEENT: normal. Neck: Supple, no JVD, carotid bruits, or masses. Cardiac: Trace edema to BLE. RRR, no murmurs, rubs, or  gallops. No clubbing, cyanosis.  Radials/DP/PT 1+ and equal bilaterally.  Respiratory:  Respirations regular  and unlabored, diminished breath sounds bilat. GI: Soft, nontender, nondistended, BS + x 4. MS: no deformity or atrophy. Skin: warm and dry.  Asymmetric growth upper left cheek. Neuro:  Strength and sensation are intact. Psych: Flat affect.  Accessory Clinical Findings  ECG - RSR, 61, 1st deg avb, LAD, RBBB and Left anterior fascicular block. Q's in I/aVL.   Assessment & Plan  1. Chronic lower extremity edema: Wheelchair bound at present with legs remaining dependent for the majority of the day for the past 6-8 months. Edema improves with the use of TED hose but pt only wears them on and off when the staff will put them on for her. Trace edema today to BLE with TEDS in place.  She had nl LV fxn by echo in May 2015 and does not have any CHF Ss.  Overall, volume looks pretty good today.  I suspect that edema is predominantly dependent in nature.  Continue TEDS.  Encouraged to elevate legs while sitting.  Continue Lasix 40 mg daily.  No further w/u @ this time.  2.  HTN:  On multiple meds including max dose coreg and amlodipine along with moderate dose clonidine.  BP is soft today.  Rec continued following @ SNF with consideration being given to backing off of clonidine some if BP's continue to trend in the low 100's.  3.  HL:  LDL 28 in May with nl LFT's.  Cont statin therapy.  4.  PAD:  H/O remote R fem-pop bypass and subsequent R CIA stenting in 2000. No claudication but doesn't exert.  She continues to smoke.  Smoking cessation advised.  Cont asa, statin.  She has f/u with Dr. Donnetta Hutching in February.  5.  Tob Abuse:  Still smoking 4 cigarettes/day.  She is not motivated to quit and says that she's been doing it for 60 yrs with some degree of pride.  Complete cessation advised.   6.  CKD III:  Her dtr says that she has had labs drawn since her last admission and that she has stable  CKD.  7.  Dispo:  F/U Dr. Johnsie Cancel in 1 yr or sooner if necessary.     Murray Hodgkins, NP 06/12/2014, 2:49 PM

## 2014-06-12 NOTE — Progress Notes (Signed)
Patient ID: [name], female   DOB: 28-Nov-1934, 80 y.o.   MRN: [id number]    79 y.o. patient of Dr Ellyn Hack Just seen by him 6/15 to clear for GB surgery  She is a  resident of [facility], past medical history of hypertension, type II DM with renal complications, CAD, PAD, stage III chronic kidney disease, COPD, tobacco abuse, bilateral macular degeneration, hypothyroidism, Distant history of cath I believe in 2007 with moderate circumflex disease Rx medically Cath reviewed from 2007 done by Dr Domenic Polite   DIAGNOSES: 1. Coronary disease as outlined. The patient has relatively small caliber  coronary vessels likely due to her longstanding diabetes mellitus.  There are 75% stenoses in the mid to distal circumflex/obtuse marginal  system, although these vessels are relatively small in caliber. The  right coronary artery is also small in caliber and diffusely diseased  although not obstructive in nature. 2. Left ventricular ejection fraction approximately 55% with mid posterior  hypokinesis. No significant mitral regurgitation and left ventricular  end-diastolic pressure of 15 mmHg.  Echo done 5/15 with normal EF   Study Conclusions  - Left ventricle: The cavity size was normal. Systolic function was normal. Wall motion was normal; there were no regional wall motion abnormalities. - Left atrium: The atrium was mildly to moderately dilated. - Right atrium: The atrium was mildly dilated.   ROS: Denies fever, malais, weight loss, blurry vision, decreased visual acuity, cough, sputum, SOB, hemoptysis, pleuritic pain, palpitaitons, heartburn, abdominal pain, melena, lower extremity edema, claudication, or rash.  All other systems reviewed and negative   General: Affect appropriate Healthy:  appears stated age 39: normal Neck supple with no adenopathy JVP normal no bruits no thyromegaly Lungs clear with no wheezing and good diaphragmatic  motion Heart:  S1/S2 no murmur,rub, gallop or click PMI normal Abdomen: benighn, BS positve, no tenderness, no AAA no bruit.  No HSM or HJR Distal pulses intact with no bruits No edema Neuro non-focal Skin warm and dry No muscular weakness  Medications Current Outpatient Prescriptions  Medication Sig Dispense Refill  . acetaminophen (TYLENOL) 500 MG tablet Take 500 mg by mouth every 4 (four) hours as needed for mild pain, fever or headache.    . albuterol (PROVENTIL) (2.5 MG/3ML) 0.083% nebulizer solution Take 2.5 mg by nebulization every 6 (six) hours as needed for wheezing.    Marland Kitchen ALPRAZolam (XANAX) 0.25 MG tablet Take 1 tablet (0.25 mg total) by mouth 2 (two) times daily as needed for anxiety. 30 tablet 0  . alum & mag hydroxide-simeth (MAALOX/MYLANTA) 200-200-20 MG/5ML suspension Take 30 mLs by mouth every 6 (six) hours as needed for indigestion or heartburn. Do not exceed 4 doses in 24 hours    . amitriptyline (ELAVIL) 50 MG tablet Take 50 mg by mouth at bedtime.    Marland Kitchen amLODipine (NORVASC) 10 MG tablet Take 10 mg by mouth daily.     Marland Kitchen aspirin 81 MG chewable tablet Chew 81 mg by mouth daily.    Marland Kitchen aspirin EC 325 MG tablet Take 1 tablet (325 mg total) by mouth daily. 30 tablet 6  . budesonide-formoterol (SYMBICORT) 160-4.5 MCG/ACT inhaler Inhale 2 puffs into the lungs 2 (two) times daily.    . carvedilol (COREG) 25 MG tablet Take 25 mg by mouth 2 (two) times daily with a meal. 0800 and 1700    . ciprofloxacin (CIPRO) 500 MG tablet Take 1 tablet (500 mg total) by mouth 2 (two) times daily. 20 tablet 0  .  cloNIDine (CATAPRES) 0.2 MG tablet Take 0.2 mg by mouth 2 (two) times daily. Hold if blood pressure less than 100/60    . collagenase (SANTYL) ointment Apply 1 application topically daily. Apply for heel changes 3 times per week.    . furosemide (LASIX) 40 MG tablet Take 40 mg by mouth daily.    Marland Kitchen gabapentin (NEURONTIN) 100 MG capsule Take 200 mg by mouth 3 (three) times daily.    Marland Kitchen  gabapentin (NEURONTIN) 300 MG capsule Take 300 mg by mouth 3 (three) times daily.    Marland Kitchen guaiFENesin (ROBITUSSIN) 100 MG/5ML liquid Take 100 mg by mouth every 6 (six) hours as needed for cough. Not to exceed 4 doses in 24 hours    . HYDROcodone-acetaminophen (NORCO/VICODIN) 5-325 MG per tablet Take 1 tablet by mouth every 8 (eight) hours as needed for moderate pain.    Marland Kitchen insulin glargine (LANTUS) 100 UNIT/ML injection Inject 43 Units into the skin at bedtime.     . insulin lispro (HUMALOG) 100 UNIT/ML injection Inject 6 Units into the skin 3 (three) times daily with meals. CBG greater than 350=14 units    . lactose free nutrition (BOOST PLUS) LIQD Take 237 mLs by mouth 3 (three) times daily with meals.  0  . levothyroxine (SYNTHROID, LEVOTHROID) 50 MCG tablet Take 50 mcg by mouth every morning.     . loperamide (IMODIUM) 2 MG capsule Take 2 mg by mouth as needed for diarrhea or loose stools. Do not exceed 8 doses in 24 hours    . magnesium hydroxide (MILK OF MAGNESIA) 400 MG/5ML suspension Take 30 mLs by mouth daily as needed for mild constipation.    . Menthol-Zinc Oxide (RISAMINE) 0.44-20.625 % OINT Apply 1 application topically 3 (three) times daily.    . Multiple Vitamins-Minerals (HEALTHY EYES) TABS Take 1 tablet by mouth every morning.     . mupirocin ointment (BACTROBAN) 2 % Apply 1 application topically 2 (two) times daily. X 1 week 30 g 0  . oxyCODONE-acetaminophen (PERCOCET/ROXICET) 5-325 MG per tablet Take 1-2 tablets by mouth every 4 (four) hours as needed for severe pain. 20 tablet 0  . pantoprazole (PROTONIX) 40 MG tablet Take 1 tablet (40 mg total) by mouth daily. 30 tablet 0  . polyethylene glycol (MIRALAX / GLYCOLAX) packet Take 17 g by mouth daily as needed for mild constipation, moderate constipation or severe constipation.     . promethazine (PHENERGAN) 25 MG tablet Take 25 mg by mouth every 6 (six) hours as needed for nausea or vomiting.    . rosuvastatin (CRESTOR) 20 MG tablet  Take 20 mg by mouth every evening.     . senna-docusate (SENOKOT-S) 8.6-50 MG per tablet Take 2 tablets by mouth at bedtime.     . sucralfate (CARAFATE) 1 GM/10ML suspension Take 10 mLs (1 g total) by mouth every 6 (six) hours. 420 mL 0  . tiotropium (SPIRIVA) 18 MCG inhalation capsule Place 18 mcg into inhaler and inhale daily.    . traMADol (ULTRAM) 50 MG tablet Take 50 mg by mouth every 6 (six) hours as needed.    . trimethoprim (TRIMPEX) 100 MG tablet      No current facility-administered medications for this visit.    Allergies Codeine; Lisinopril; Penicillins; Septra; and Sulfur  Family History: No family history on file.  Social History: History   Social History  . Marital Status: Divorced    Spouse Name: N/A    Number of Children: 4  . Years of  Education: 9   Occupational History  . retired    Social History Main Topics  . Smoking status: Current Every Day Smoker -- 0.12 packs/day for 60 years    Types: Cigarettes  . Smokeless tobacco: Never Used  . Alcohol Use: No  . Drug Use: No  . Sexual Activity: No   Other Topics Concern  . Not on file   Social History Narrative   Lives with her Yolanda Bonine     Past Surgical History  Procedure Laterality Date  . Tonsillectomy  1940's  . Abdominal hysterectomy  1970's  . Dilation and curettage of uterus  1970's    "probably 2" (11/01/2012)  . Cataract extraction w/ intraocular lens  implant, bilateral Bilateral ~ 2010  . Bypass graft Right ~ 1997    RLE by Dr. Gwenlyn Perking 11/29/2005 (11/01/2012)  . Cystostomy w/ bladder biopsy  2005    Archie Endo 09/19/2003 (11/01/2012)  . Anterior cervical decomp/discectomy fusion  01/02/2006    Archie Endo 01/02/2006 (11/01/2012)  . Shoulder open rotator cuff repair Bilateral 1990's-2000's    "3X on the left; twice on the right" (11/01/2012)  . Cardiac catheterization  11/29/2005    Archie Endo 11/29/2005 (11/01/2012)  . Esophagogastroduodenoscopy N/A 10/14/2013    Procedure: ESOPHAGOGASTRODUODENOSCOPY  (EGD);  Surgeon: Missy Sabins, MD;  Location: [facility];  Service: Endoscopy;  Laterality: N/A;    Past Medical History  Diagnosis Date  . Hypertension   . Macular degeneration, bilateral   . Type II diabetes mellitus   . COPD (chronic obstructive pulmonary disease)   . Diabetic peripheral neuropathy   . Hypercholesteremia   . PAD (peripheral artery disease)   . Chronic kidney disease   . Headache(784.0)   . Hypothyroid   . Coronary artery disease     Electrocardiogram: 5/15  SR RBBB lateral infarct   Assessment and Plan

## 2014-06-12 NOTE — Patient Instructions (Signed)
Your physician recommends that you continue on your current medications as directed. Please refer to the Current Medication list given to you today.    Your physician wants you to follow-up in: in 1 year with dr Blima Singer will receive a reminder letter in the mail two months in advance. If you don't receive a letter, please call our office to schedule the follow-up appointment.

## 2014-06-23 ENCOUNTER — Encounter: Payer: Self-pay | Admitting: Vascular Surgery

## 2014-06-24 ENCOUNTER — Ambulatory Visit (INDEPENDENT_AMBULATORY_CARE_PROVIDER_SITE_OTHER)
Admission: RE | Admit: 2014-06-24 | Discharge: 2014-06-24 | Disposition: A | Discharge status: Home / Self Care | Payer: Medicare HMO | Source: Ambulatory Visit | Attending: Vascular Surgery | Admitting: Vascular Surgery

## 2014-06-24 ENCOUNTER — Encounter: Payer: Self-pay | Admitting: Vascular Surgery

## 2014-06-24 ENCOUNTER — Ambulatory Visit (INDEPENDENT_AMBULATORY_CARE_PROVIDER_SITE_OTHER): Payer: Medicare HMO | Admitting: Vascular Surgery

## 2014-06-24 ENCOUNTER — Ambulatory Visit (HOSPITAL_COMMUNITY)
Admission: RE | Admit: 2014-06-24 | Discharge: 2014-06-24 | Disposition: A | Discharge status: Home / Self Care | Payer: Medicare HMO | Source: Ambulatory Visit | Attending: Vascular Surgery | Admitting: Vascular Surgery

## 2014-06-24 VITALS — BP 173/42 | HR 61 | Ht 63.0 in | Wt 196.0 lb

## 2014-06-24 DIAGNOSIS — Z9889 Other specified postprocedural states: Secondary | ICD-10-CM

## 2014-06-24 DIAGNOSIS — I739 Peripheral vascular disease, unspecified: Secondary | ICD-10-CM | POA: Insufficient documentation

## 2014-06-24 DIAGNOSIS — Z959 Presence of cardiac and vascular implant and graft, unspecified: Secondary | ICD-10-CM

## 2014-06-24 DIAGNOSIS — Z95828 Presence of other vascular implants and grafts: Secondary | ICD-10-CM

## 2014-06-24 NOTE — Progress Notes (Signed)
Patient name: [name] MRN: [id number] DOB: July 05, 1934 Sex: female   Referred by: Brigitte Pulse  Reason for referral:  Chief Complaint  Patient presents with  . Re-evaluation    bilateral LE swelling    HISTORY OF PRESENT ILLNESS: Patient resents today for follow-up of her peripheral vascular occlusive disease. She is well-known to me from a right femoral to above-knee popliteal bypass with Gore-Tex graft in 1997. She had iliac stenoses with angioplasty prior to this. She has always had difficulty with her right leg. Despite having normal arterial flow for nearly 20 years after surgery continues to have difficulty with walking. She reports this persists. She is now in assisted living facility with some failing health. Some leg discomfort with walking and right is worse than left. This is total leg discomfort. She does have some mild swelling but no evidence of venous hypertension. She reports that she does remain stable from a cardiac standpoint.  Past Medical History  Diagnosis Date  . Hypertension   . Macular degeneration, bilateral   . Type II diabetes mellitus   . COPD (chronic obstructive pulmonary disease)   . Diabetic peripheral neuropathy   . Hypercholesteremia   . PAD (peripheral artery disease)     a. 1997 s/p R fem-pop bypass;  b. 03/1999 R CIA stenting.  . CKD (chronic kidney disease), stage III   . Headache(784.0)   . Hypothyroid   . Non-obstructive CAD     a. 11/2005 Cath: LM nl, LAD 30 diffuse, LCX small, 20p, 36m/d, 75d into OM, RCA dominant, 40p, 30-40 diffuse, EF 55%.  Marland Kitchen TIA (transient ischemic attack)     a. 09/2013.  . H/O echocardiogram     a. 09/2013 Echo: nl LV fxn, no rwma, mildly to mod dil LA, mildly dil RA.  . Carotid arterial disease     a. 09/2013 Carotid U/S: 40-59% bilat ICA stenosis.  . Enterococcus UTI     a. 09/2013.  . Tobacco abuse   . Cholelithiasis     Past Surgical History  Procedure Laterality Date  . Tonsillectomy  1940's  .  Abdominal hysterectomy  1970's  . Dilation and curettage of uterus  1970's    "probably 2" (11/01/2012)  . Cataract extraction w/ intraocular lens  implant, bilateral Bilateral ~ 2010  . Bypass graft Right ~ 1997    RLE by Dr. Gwenlyn Perking 11/29/2005 (11/01/2012)  . Cystostomy w/ bladder biopsy  2005    Archie Endo 09/19/2003 (11/01/2012)  . Anterior cervical decomp/discectomy fusion  01/02/2006    Archie Endo 01/02/2006 (11/01/2012)  . Shoulder open rotator cuff repair Bilateral 1990's-2000's    "3X on the left; twice on the right" (11/01/2012)  . Cardiac catheterization  11/29/2005    Ar[facility] Endo 11/29/2005 (11/01/2012)  . Esophagogastroduodenoscopy N/A 10/14/2013    Procedure: ESOPHAGOGASTRODUODENOSCOPY (EGD);  Surgeon: Missy Sabins, MD;  Location: [facility];  Service: Endoscopy;  Laterality: N/A;    History   Social History  . Marital Status: Divorced    Spouse Name: N/A    Number of Children: 4  . Years of Education: 9   Occupational History  . retired    Social History Main Topics  . Smoking status: Current Every Day Smoker -- 0.12 packs/day for 60 years    Types: Cigarettes  . Smokeless tobacco: Never Used     Comment: states she smokes 4 cigs per day  . Alcohol Use: No  . Drug Use: No  . Sexual Activity: No  Other Topics Concern  . Not on file   Social History Narrative   Lives with her Grandson     History reviewed. No pertinent family history.  Allergies as of 06/24/2014 - Review Complete 06/24/2014  Allergen Reaction Noted  . Codeine Other (See Comments) 03/02/2013  . Lisinopril Other (See Comments) 03/02/2013  . Penicillins Hives 01/23/2012  . Septra [sulfamethoxazole-trimethoprim] Hives and Rash 10/07/2013  . Sulfur Hives and Rash 10/31/2012    Current Outpatient Prescriptions on File Prior to Visit  Medication Sig Dispense Refill  . acetaminophen (TYLENOL) 500 MG tablet Take 500 mg by mouth every 4 (four) hours as needed for mild pain, fever or headache.    . albuterol  (PROVENTIL) (2.5 MG/3ML) 0.083% nebulizer solution Take 2.5 mg by nebulization every 6 (six) hours as needed for wheezing.    Marland Kitchen ALPRAZolam (XANAX) 0.25 MG tablet Take 1 tablet (0.25 mg total) by mouth 2 (two) times daily as needed for anxiety. 30 tablet 0  . alum & mag hydroxide-simeth (MAALOX/MYLANTA) 200-200-20 MG/5ML suspension Take 30 mLs by mouth every 6 (six) hours as needed for indigestion or heartburn. Do not exceed 4 doses in 24 hours    . amitriptyline (ELAVIL) 50 MG tablet Take 50 mg by mouth at bedtime.    Marland Kitchen amLODipine (NORVASC) 10 MG tablet Take 10 mg by mouth daily.     Marland Kitchen aspirin 81 MG chewable tablet Chew 81 mg by mouth daily.    Marland Kitchen aspirin EC 325 MG tablet Take 1 tablet (325 mg total) by mouth daily. 30 tablet 6  . budesonide-formoterol (SYMBICORT) 160-4.5 MCG/ACT inhaler Inhale 2 puffs into the lungs 2 (two) times daily.    . carvedilol (COREG) 25 MG tablet Take 25 mg by mouth 2 (two) times daily with a meal. 0800 and 1700    . ciprofloxacin (CIPRO) 500 MG tablet Take 1 tablet (500 mg total) by mouth 2 (two) times daily. 20 tablet 0  . cloNIDine (CATAPRES) 0.2 MG tablet Take 0.2 mg by mouth 2 (two) times daily. Hold if blood pressure less than 100/60    . collagenase (SANTYL) ointment Apply 1 application topically daily. Apply for heel changes 3 times per week.    . escitalopram (LEXAPRO) 10 MG tablet Take 10 mg by mouth daily.    . furosemide (LASIX) 40 MG tablet Take 40 mg by mouth daily.    Marland Kitchen gabapentin (NEURONTIN) 300 MG capsule Take 300 mg by mouth 3 (three) times daily.    Marland Kitchen guaiFENesin (ROBITUSSIN) 100 MG/5ML liquid Take 100 mg by mouth every 6 (six) hours as needed for cough. Not to exceed 4 doses in 24 hours    . HYDROcodone-acetaminophen (NORCO/VICODIN) 5-325 MG per tablet Take 1 tablet by mouth every 8 (eight) hours as needed for moderate pain.    Marland Kitchen insulin glargine (LANTUS) 100 UNIT/ML injection Inject 43 Units into the skin at bedtime.     . insulin lispro (HUMALOG) 100  UNIT/ML injection Inject 6 Units into the skin 3 (three) times daily with meals. CBG greater than 350=14 units    . lactose free nutrition (BOOST PLUS) LIQD Take 237 mLs by mouth 3 (three) times daily with meals.  0  . levothyroxine (SYNTHROID, LEVOTHROID) 50 MCG tablet Take 50 mcg by mouth every morning.     . loperamide (IMODIUM) 2 MG capsule Take 2 mg by mouth as needed for diarrhea or loose stools. Do not exceed 8 doses in 24 hours    . loratadine (  CLARITIN) 10 MG tablet Take 10 mg by mouth daily.    . magnesium hydroxide (MILK OF MAGNESIA) 400 MG/5ML suspension Take 30 mLs by mouth daily as needed for mild constipation.    . Menthol-Zinc Oxide (RISAMINE) 0.44-20.625 % OINT Apply 1 application topically 3 (three) times daily.    . Multiple Vitamins-Minerals (HEALTHY EYES) TABS Take 1 tablet by mouth every morning.     . mupirocin ointment (BACTROBAN) 2 % Apply 1 application topically 2 (two) times daily. X 1 week 30 g 0  . ondansetron (ZOFRAN) 8 MG tablet Take by mouth every 8 (eight) hours as needed for nausea or vomiting.    Marland Kitchen oxyCODONE-acetaminophen (PERCOCET/ROXICET) 5-325 MG per tablet Take 1-2 tablets by mouth every 4 (four) hours as needed for severe pain. 20 tablet 0  . pantoprazole (PROTONIX) 40 MG tablet Take 1 tablet (40 mg total) by mouth daily. 30 tablet 0  . polyethylene glycol (MIRALAX / GLYCOLAX) packet Take 17 g by mouth daily as needed for mild constipation, moderate constipation or severe constipation.     . promethazine (PHENERGAN) 25 MG tablet Take 25 mg by mouth every 6 (six) hours as needed for nausea or vomiting.    . rosuvastatin (CRESTOR) 20 MG tablet Take 20 mg by mouth every evening.     . senna-docusate (SENOKOT-S) 8.6-50 MG per tablet Take 2 tablets by mouth at bedtime.     . sucralfate (CARAFATE) 1 GM/10ML suspension Take 10 mLs (1 g total) by mouth every 6 (six) hours. 420 mL 0  . tiotropium (SPIRIVA) 18 MCG inhalation capsule Place 18 mcg into inhaler and inhale  daily.    Marland Kitchen trimethoprim (TRIMPEX) 100 MG tablet      No current facility-administered medications on file prior to visit.     REVIEW OF SYSTEMS:  Positives indicated with an "X"  CARDIOVASCULAR:  [ ]  chest pain   [ ]  chest pressure   [ ]  palpitations   [ ]  orthopnea   [ ]  dyspnea on exertion   [ ]  claudication   [ ]  rest pain   [ ]  DVT   [ ]  phlebitis PULMONARY:   [ ]  productive cough   [ ]  asthma   [ ]  wheezing NEUROLOGIC:   [ ]  weakness  [ ]  paresthesias  [ ]  aphasia  [ ]  amaurosis  [ ]  dizziness HEMATOLOGIC:   [ ]  bleeding problems   [ ]  clotting disorders MUSCULOSKELETAL:  [ ]  joint pain   [ ]  joint swelling GASTROINTESTINAL: [ ]   blood in stool  [ ]   hematemesis GENITOURINARY:  [ ]   dysuria  [ ]   hematuria PSYCHIATRIC:  [ ]  history of major depression INTEGUMENTARY:  [ ]  rashes  [ ]  ulcers CONSTITUTIONAL:  [ ]  fever   [ ]  chills  PHYSICAL EXAMINATION:  General: The patient is a well-nourished female, in no acute distress. Vital signs are BP 173/42 mmHg  Pulse 61  Ht 5\' 3"  (1.6 m)  Wt 196 lb (88.905 kg)  BMI 34.73 kg/m2  SpO2 97% Pulmonary: There is a good air exchange bilaterally without wheezing or rales. Abdomen: Soft and non-tender  Moderate obesity Musculoskeletal: There are no major deformities.  There is no significant extremity pain. Mild lower extremity swelling Neurologic: No focal weakness or paresthesias are detected, Skin: There are no ulcer or rashes noted. Psychiatric: The patient has normal affect. Cardiovascular: There is a regular rate and rhythm without significant murmur appreciated. She does have a  2+ right dorsalis pedis pulse and absent pedal pulses on the left.  [facility] Vascular Lab Studies:  Ordered and Independently Reviewed triphasic flow with slightly diminished ankle arm index on the right at 0.87. On the left her ankle arm indexes 0.5 with a high-grade stenosis in the mid superficial femoral artery by duplex. She does have evidence of iliac  stenoses bilaterally  Impression and Plan:  Stable lower extremity arterial insufficiency with 20 year history of prior intervention. I discussed this with the patient. She does have some mild arterial insufficiency but certainly do not feel this is causing her symptoms. She is able to get around with a walker and will continue to do so. I would not recommend ongoing vascular follow-up unless she should develop new symptoms with rest pain or tissue loss. She is comfortable with this decision will see [place] in as-needed basis    Brandalyn Harting [facility] Office: [phone]

## 2014-08-07 ENCOUNTER — Emergency Department (HOSPITAL_COMMUNITY): Payer: Medicare HMO

## 2014-08-07 ENCOUNTER — Emergency Department (HOSPITAL_COMMUNITY)
Admission: EM | Admit: 2014-08-07 | Discharge: 2014-08-07 | Disposition: A | Discharge status: Home / Self Care | Payer: Medicare HMO | Source: Home / Self Care | Attending: Emergency Medicine | Admitting: Emergency Medicine

## 2014-08-07 ENCOUNTER — Encounter (HOSPITAL_COMMUNITY): Payer: Self-pay | Admitting: Emergency Medicine

## 2014-08-07 DIAGNOSIS — Z8673 Personal history of transient ischemic attack (TIA), and cerebral infarction without residual deficits: Secondary | ICD-10-CM | POA: Insufficient documentation

## 2014-08-07 DIAGNOSIS — Y998 Other external cause status: Secondary | ICD-10-CM | POA: Insufficient documentation

## 2014-08-07 DIAGNOSIS — E119 Type 2 diabetes mellitus without complications: Secondary | ICD-10-CM

## 2014-08-07 DIAGNOSIS — J449 Chronic obstructive pulmonary disease, unspecified: Secondary | ICD-10-CM

## 2014-08-07 DIAGNOSIS — N183 Chronic kidney disease, stage 3 (moderate): Secondary | ICD-10-CM

## 2014-08-07 DIAGNOSIS — Z79899 Other long term (current) drug therapy: Secondary | ICD-10-CM

## 2014-08-07 DIAGNOSIS — I129 Hypertensive chronic kidney disease with stage 1 through stage 4 chronic kidney disease, or unspecified chronic kidney disease: Secondary | ICD-10-CM | POA: Insufficient documentation

## 2014-08-07 DIAGNOSIS — Z720 Tobacco use: Secondary | ICD-10-CM | POA: Insufficient documentation

## 2014-08-07 DIAGNOSIS — I2510 Atherosclerotic heart disease of native coronary artery without angina pectoris: Secondary | ICD-10-CM | POA: Insufficient documentation

## 2014-08-07 DIAGNOSIS — Y9289 Other specified places as the place of occurrence of the external cause: Secondary | ICD-10-CM | POA: Insufficient documentation

## 2014-08-07 DIAGNOSIS — Z880 Allergy status to penicillin: Secondary | ICD-10-CM

## 2014-08-07 DIAGNOSIS — Y9389 Activity, other specified: Secondary | ICD-10-CM | POA: Insufficient documentation

## 2014-08-07 DIAGNOSIS — Z8669 Personal history of other diseases of the nervous system and sense organs: Secondary | ICD-10-CM

## 2014-08-07 DIAGNOSIS — Z794 Long term (current) use of insulin: Secondary | ICD-10-CM | POA: Insufficient documentation

## 2014-08-07 DIAGNOSIS — E162 Hypoglycemia, unspecified: Secondary | ICD-10-CM | POA: Diagnosis not present

## 2014-08-07 DIAGNOSIS — S82832A Other fracture of upper and lower end of left fibula, initial encounter for closed fracture: Secondary | ICD-10-CM | POA: Insufficient documentation

## 2014-08-07 DIAGNOSIS — N179 Acute kidney failure, unspecified: Secondary | ICD-10-CM | POA: Diagnosis not present

## 2014-08-07 DIAGNOSIS — S82892A Other fracture of left lower leg, initial encounter for closed fracture: Secondary | ICD-10-CM

## 2014-08-07 HISTORY — DX: Tubulo-interstitial nephritis, not specified as acute or chronic: N12

## 2014-08-07 LAB — CBG MONITORING, ED: Glucose-Capillary: 53 mg/dL — ABNORMAL LOW (ref 70–99)

## 2014-08-07 MED ORDER — ONDANSETRON 4 MG PO TBDP: 4.0000 mg | ORAL_TABLET | Freq: Three times a day (TID) | ORAL | Status: DC | PRN

## 2014-08-07 MED ORDER — ONDANSETRON 4 MG PO TBDP
4.0000 mg | ORAL_TABLET | Freq: Once | ORAL | Status: AC
  Administered 2014-08-07: 4 mg via ORAL
  Filled 2014-08-07: qty 1

## 2014-08-07 MED ORDER — HYDROCODONE-ACETAMINOPHEN 5-325 MG PO TABS: 1.0000 | ORAL_TABLET | Freq: Four times a day (QID) | ORAL | Status: DC | PRN

## 2014-08-07 MED ORDER — DOCUSATE SODIUM 100 MG PO CAPS: 100.0000 mg | ORAL_CAPSULE | Freq: Two times a day (BID) | ORAL | Status: AC

## 2014-08-07 MED ORDER — HYDROCODONE-ACETAMINOPHEN 5-325 MG PO TABS
2.0000 | ORAL_TABLET | Freq: Once | ORAL | Status: AC
  Administered 2014-08-07: 2 via ORAL
  Filled 2014-08-07: qty 2

## 2014-08-07 NOTE — ED Notes (Signed)
MD (Ward) at bedside. 

## 2014-08-07 NOTE — ED Notes (Signed)
Per EMS pt fell about 12 hrs ago ambulance went out at that time and pt refused transport  Pt states since 7pm last night pt has been saying her left ankle has been hurting and pt has swelling noted to the inside of the ankle  EMS splinted the extremity and transported here  Denies neck or back pain

## 2014-08-07 NOTE — ED Notes (Signed)
Pt dc'd with [facility]. 

## 2014-08-07 NOTE — Discharge Instructions (Signed)
Ankle Fracture A fracture is a break in a bone. The ankle joint is made up of three bones. These include the lower (distal)sections of your lower leg bones, called the tibia and fibula, along with a bone in your foot, called the talus. Depending on how bad the break is and if more than one ankle joint bone is broken, a cast or splint is used to protect and keep your injured bone from moving while it heals. Sometimes, surgery is required to help the fracture heal properly.  There are two general types of fractures:  Stable fracture. This includes a single fracture line through one bone, with no injury to ankle ligaments. A fracture of the talus that does not have any displacement (movement of the bone on either side of the fracture line) is also stable.  Unstable fracture. This includes more than one fracture line through one or more bones in the ankle joint. It also includes fractures that have displacement of the bone on either side of the fracture line. CAUSES  A direct blow to the ankle.   Quickly and severely twisting your ankle.  Trauma, such as a car accident or falling from a significant height. RISK FACTORS You may be at a higher risk of ankle fracture if:  You have certain medical conditions.  You are involved in high-impact sports.  You are involved in a high-impact car accident. SIGNS AND SYMPTOMS   Tender and swollen ankle.  Bruising around the injured ankle.  Pain on movement of the ankle.  Difficulty walking or putting weight on the ankle.  A cold foot below the site of the ankle injury. This can occur if the blood vessels passing through your injured ankle were also damaged.  Numbness in the foot below the site of the ankle injury. DIAGNOSIS  An ankle fracture is usually diagnosed with a physical exam and X-rays. A CT scan may also be required for complex fractures. TREATMENT  Stable fractures are treated with a cast or splint and using crutches to avoid putting  weight on your injured ankle. This is followed by an ankle strengthening program. Some patients require a special type of cast, depending on other medical problems they may have. Unstable fractures require surgery to ensure the bones heal properly. Your health care provider will tell you what type of fracture you have and the best treatment for your condition. HOME CARE INSTRUCTIONS   Review correct crutch use with your health care provider and use your crutches as directed. Safe use of crutches is extremely important. Misuse of crutches can cause you to fall or cause injury to nerves in your hands or armpits.  Do not put weight or pressure on the injured ankle until directed by your health care provider.  To lessen the swelling, keep the injured leg elevated while sitting or lying down.  Apply ice to the injured area:  Put ice in a plastic bag.  Place a towel between your cast and the bag.  Leave the ice on for 20 minutes, 2-3 times a day.  If you have a plaster or fiberglass cast:  Do not try to scratch the skin under the cast with any objects. This can increase your risk of skin infection.  Check the skin around the cast every day. You may put lotion on any red or sore areas.  Keep your cast dry and clean.  If you have a plaster splint:  Wear the splint as directed.  You may loosen the elastic  around the splint if your toes become numb, tingle, or turn cold or blue.  Do not put pressure on any part of your cast or splint; it may break. Rest your cast only on a pillow the first 24 hours until it is fully hardened.  Your cast or splint can be protected during bathing with a plastic bag sealed to your skin with medical tape. Do not lower the cast or splint into water.  Take medicines as directed by your health care provider. Only take over-the-counter or prescription medicines for pain, discomfort, or fever as directed by your health care provider.  Do not drive a vehicle until  your health care provider specifically tells you it is safe to do so.  If your health care provider has given you a follow-up appointment, it is very important to keep that appointment. Not keeping the appointment could result in a chronic or permanent injury, pain, and disability. If you have any problem keeping the appointment, call the facility for assistance. SEEK MEDICAL CARE IF: You develop increased swelling or discomfort. SEEK IMMEDIATE MEDICAL CARE IF:   Your cast gets damaged or breaks.  You have continued severe pain.  You develop new pain or swelling after the cast was put on.  Your skin or toenails below the injury turn blue or gray.  Your skin or toenails below the injury feel cold, numb, or have loss of sensitivity to touch.  There is a bad smell or pus draining from under the cast. MAKE SURE YOU:   Understand these instructions.  Will watch your condition.  Will get help right away if you are not doing well or get worse. Document Released: 04/29/2000 Document Revised: 05/07/2013 Document Reviewed: 11/29/2012 [facility] Patient Information 2015 [place], [place]. This information is not intended to replace advice given to you by your health care provider. Make sure you discuss any questions you have with your health care provider.  Cast or Splint Care Casts and splints support injured limbs and keep bones from moving while they heal. It is important to care for your cast or splint at home.  HOME CARE INSTRUCTIONS  Keep the cast or splint uncovered during the drying period. It can take 24 to 48 hours to dry if it is made of plaster. A fiberglass cast will dry in less than 1 hour.  Do not rest the cast on anything harder than a pillow for the first 24 hours.  Do not put weight on your injured limb or apply pressure to the cast until your health care provider gives you permission.  Keep the cast or splint dry. Wet casts or splints can lose their shape and may not  support the limb as well. A wet cast that has lost its shape can also create harmful pressure on your skin when it dries. Also, wet skin can become infected.  Cover the cast or splint with a plastic bag when bathing or when out in the rain or snow. If the cast is on the trunk of the body, take sponge baths until the cast is removed.  If your cast does become wet, dry it with a towel or a blow dryer on the cool setting only.  Keep your cast or splint clean. Soiled casts may be wiped with a moistened cloth.  Do not place any hard or soft foreign objects under your cast or splint, such as cotton, toilet paper, lotion, or powder.  Do not try to scratch the skin under the cast with  any object. The object could get stuck inside the cast. Also, scratching could lead to an infection. If itching is a problem, use a blow dryer on a cool setting to relieve discomfort.  Do not trim or cut your cast or remove padding from inside of it.  Exercise all joints next to the injury that are not immobilized by the cast or splint. For example, if you have a long leg cast, exercise the hip joint and toes. If you have an arm cast or splint, exercise the shoulder, elbow, thumb, and fingers.  Elevate your injured arm or leg on 1 or 2 pillows for the first 1 to 3 days to decrease swelling and pain.It is best if you can comfortably elevate your cast so it is higher than your heart. SEEK MEDICAL CARE IF:   Your cast or splint cracks.  Your cast or splint is too tight or too loose.  You have unbearable itching inside the cast.  Your cast becomes wet or develops a soft spot or area.  You have a bad smell coming from inside your cast.  You get an object stuck under your cast.  Your skin around the cast becomes red or raw.  You have new pain or worsening pain after the cast has been applied. SEEK IMMEDIATE MEDICAL CARE IF:   You have fluid leaking through the cast.  You are unable to move your fingers or  toes.  You have discolored (blue or white), cool, painful, or very swollen fingers or toes beyond the cast.  You have tingling or numbness around the injured area.  You have severe pain or pressure under the cast.  You have any difficulty with your breathing or have shortness of breath.  You have chest pain. Document Released: 04/29/2000 Document Revised: 02/20/2013 Document Reviewed: 11/08/2012 [facility] Patient Information 2015 [place], [place]. This information is not intended to replace advice given to you by your health care provider. Make sure you discuss any questions you have with your health care provider.   RICE: Routine Care for Injuries The routine care of many injuries includes Rest, Ice, Compression, and Elevation (RICE). HOME CARE INSTRUCTIONS  Rest is needed to allow your body to heal. Routine activities can usually be resumed when comfortable. Injured tendons and bones can take up to 6 weeks to heal. Tendons are the cord-like structures that attach muscle to bone.  Ice following an injury helps keep the swelling down and reduces pain.  Put ice in a plastic bag.  Place a towel between your skin and the bag.  Leave the ice on for 15-20 minutes, 3-4 times a day, or as directed by your health care provider. Do this while awake, for the first 24 to 48 hours. After that, continue as directed by your caregiver.  Compression helps keep swelling down. It also gives support and helps with discomfort. If an elastic bandage has been applied, it should be removed and reapplied every 3 to 4 hours. It should not be applied tightly, but firmly enough to keep swelling down. Watch fingers or toes for swelling, bluish discoloration, coldness, numbness, or excessive pain. If any of these problems occur, remove the bandage and reapply loosely. Contact your caregiver if these problems continue.  Elevation helps reduce swelling and decreases pain. With extremities, such as the arms, hands,  legs, and feet, the injured area should be placed near or above the level of the heart, if possible. SEEK IMMEDIATE MEDICAL CARE IF:  You have persistent pain  and swelling.  You develop redness, numbness, or unexpected weakness.  Your symptoms are getting worse rather than improving after several days. These symptoms may indicate that further evaluation or further X-rays are needed. Sometimes, X-rays may not show a small broken bone (fracture) until 1 week or 10 days later. Make a follow-up appointment with your caregiver. Ask when your X-ray results will be ready. Make sure you get your X-ray results. Document Released: 08/14/2000 Document Revised: 05/07/2013 Document Reviewed: 10/01/2010 [facility] Patient Information 2015 [place], [place]. This information is not intended to replace advice given to you by your health care provider. Make sure you discuss any questions you have with your health care provider.

## 2014-08-07 NOTE — ED Provider Notes (Signed)
TIME SEEN: 5:45 AM  CHIEF COMPLAINT: Fall, left leg pain  HPI: Pt is a 80 y.o. female with history of hypertension, hyperlipidemia, hypothyroidism, COPD, diabetes who presents to the emergency department with left ankle and leg pain after she slipped out of her wheelchair approximately 12 hours ago and landed on her left side. Denies hitting her head or losing consciousness. Denies being on anticoagulation. No neck or back pain. No chest pain or shortness of breath. No abdominal pain. No numbness, tingling or focal weakness.  ROS: See HPI Constitutional: no fever  Eyes: no drainage  ENT: no runny nose   Cardiovascular:  no chest pain  Resp: no SOB  GI: no vomiting GU: no dysuria Integumentary: no rash  Allergy: no hives  Musculoskeletal: no leg swelling  Neurological: no slurred speech ROS otherwise negative  PAST MEDICAL HISTORY/PAST SURGICAL HISTORY:  Past Medical History  Diagnosis Date  . Hypertension   . Macular degeneration, bilateral   . Type II diabetes mellitus   . COPD (chronic obstructive pulmonary disease)   . Diabetic peripheral neuropathy   . Hypercholesteremia   . PAD (peripheral artery disease)     a. 1997 s/p R fem-pop bypass;  b. 03/1999 R CIA stenting.  . CKD (chronic kidney disease), stage III   . Headache(784.0)   . Hypothyroid   . Non-obstructive CAD     a. 11/2005 Cath: LM nl, LAD 30 diffuse, LCX small, 20p, 9m/d, 75d into OM, RCA dominant, 40p, 30-40 diffuse, EF 55%.  Marland Kitchen TIA (transient ischemic attack)     a. 09/2013.  . H/O echocardiogram     a. 09/2013 Echo: nl LV fxn, no rwma, mildly to mod dil LA, mildly dil RA.  . Carotid arterial disease     a. 09/2013 Carotid U/S: 40-59% bilat ICA stenosis.  . Enterococcus UTI     a. 09/2013.  . Tobacco abuse   . Cholelithiasis   . Pyelonephritis     MEDICATIONS:  Prior to Admission medications   Medication Sig Start Date End Date Taking? Authorizing Provider  acetaminophen (TYLENOL) 500 MG tablet Take 500  mg by mouth every 4 (four) hours as needed for mild pain, fever or headache.    Historical Provider, MD  albuterol (PROVENTIL) (2.5 MG/3ML) 0.083% nebulizer solution Take 2.5 mg by nebulization every 6 (six) hours as needed for wheezing.    Historical Provider, MD  ALPRAZolam Duanne Moron) 0.25 MG tablet Take 1 tablet (0.25 mg total) by mouth 2 (two) times daily as needed for anxiety. 10/16/13   Maryann Mikhail, DO  alum & mag hydroxide-simeth (MAALOX/MYLANTA) 200-200-20 MG/5ML suspension Take 30 mLs by mouth every 6 (six) hours as needed for indigestion or heartburn. Do not exceed 4 doses in 24 hours    Historical Provider, MD  amitriptyline (ELAVIL) 50 MG tablet Take 50 mg by mouth at bedtime.    Historical Provider, MD  amLODipine (NORVASC) 10 MG tablet Take 10 mg by mouth daily.     Historical Provider, MD  aspirin 81 MG chewable tablet Chew 81 mg by mouth daily.    Historical Provider, MD  aspirin EC 325 MG tablet Take 1 tablet (325 mg total) by mouth daily. 01/16/14   Philmore Pali, NP  budesonide-formoterol (SYMBICORT) 160-4.5 MCG/ACT inhaler Inhale 2 puffs into the lungs 2 (two) times daily.    Historical Provider, MD  carvedilol (COREG) 25 MG tablet Take 25 mg by mouth 2 (two) times daily with a meal. 0800 and 1700  Historical Provider, MD  ciprofloxacin (CIPRO) 500 MG tablet Take 1 tablet (500 mg total) by mouth 2 (two) times daily. 11/22/13   Linton Flemings, MD  cloNIDine (CATAPRES) 0.2 MG tablet Take 0.2 mg by mouth 2 (two) times daily. Hold if blood pressure less than 100/60    Historical Provider, MD  collagenase (SANTYL) ointment Apply 1 application topically daily. Apply for heel changes 3 times per week.    Historical Provider, MD  Cranberry 475 MG CAPS Take 2 capsules by mouth 2 (two) times daily.    Historical Provider, MD  escitalopram (LEXAPRO) 10 MG tablet Take 10 mg by mouth daily.    Historical Provider, MD  furosemide (LASIX) 40 MG tablet Take 40 mg by mouth daily.    Historical Provider, MD   gabapentin (NEURONTIN) 300 MG capsule Take 300 mg by mouth 3 (three) times daily.    Historical Provider, MD  guaiFENesin (ROBITUSSIN) 100 MG/5ML liquid Take 100 mg by mouth every 6 (six) hours as needed for cough. Not to exceed 4 doses in 24 hours    Historical Provider, MD  HYDROcodone-acetaminophen (NORCO/VICODIN) 5-325 MG per tablet Take 1 tablet by mouth every 8 (eight) hours as needed for moderate pain.    Historical Provider, MD  insulin glargine (LANTUS) 100 UNIT/ML injection Inject 43 Units into the skin at bedtime.     Historical Provider, MD  insulin lispro (HUMALOG) 100 UNIT/ML injection Inject 6 Units into the skin 3 (three) times daily with meals. CBG greater than 350=14 units    Historical Provider, MD  lactose free nutrition (BOOST PLUS) LIQD Take 237 mLs by mouth 3 (three) times daily with meals. 10/16/13   Maryann Mikhail, DO  levothyroxine (SYNTHROID, LEVOTHROID) 50 MCG tablet Take 50 mcg by mouth every morning.     Historical Provider, MD  loperamide (IMODIUM) 2 MG capsule Take 2 mg by mouth as needed for diarrhea or loose stools. Do not exceed 8 doses in 24 hours    Historical Provider, MD  loratadine (CLARITIN) 10 MG tablet Take 10 mg by mouth daily.    Historical Provider, MD  magnesium hydroxide (MILK OF MAGNESIA) 400 MG/5ML suspension Take 30 mLs by mouth daily as needed for mild constipation.    Historical Provider, MD  Menthol-Zinc Oxide (RISAMINE) 0.44-20.625 % OINT Apply 1 application topically 3 (three) times daily.    Historical Provider, MD  Multiple Vitamins-Minerals (HEALTHY EYES) TABS Take 1 tablet by mouth every morning.     Historical Provider, MD  mupirocin ointment (BACTROBAN) 2 % Apply 1 application topically 2 (two) times daily. X 1 week 05/18/14   Gregor Hams, MD  ondansetron (ZOFRAN) 8 MG tablet Take by mouth every 8 (eight) hours as needed for nausea or vomiting.    Historical Provider, MD  oxyCODONE-acetaminophen (PERCOCET/ROXICET) 5-325 MG per tablet Take  1-2 tablets by mouth every 4 (four) hours as needed for severe pain. 03/17/14   Virgel Manifold, MD  pantoprazole (PROTONIX) 40 MG tablet Take 1 tablet (40 mg total) by mouth daily. 10/16/13   Maryann Mikhail, DO  polyethylene glycol (MIRALAX / GLYCOLAX) packet Take 17 g by mouth daily as needed for mild constipation, moderate constipation or severe constipation.     Historical Provider, MD  promethazine (PHENERGAN) 25 MG tablet Take 25 mg by mouth every 6 (six) hours as needed for nausea or vomiting.    Historical Provider, MD  rosuvastatin (CRESTOR) 20 MG tablet Take 20 mg by mouth every evening.  Historical Provider, MD  senna-docusate (SENOKOT-S) 8.6-50 MG per tablet Take 2 tablets by mouth at bedtime.     Historical Provider, MD  sucralfate (CARAFATE) 1 GM/10ML suspension Take 10 mLs (1 g total) by mouth every 6 (six) hours. 10/16/13   Maryann Mikhail, DO  tiotropium (SPIRIVA) 18 MCG inhalation capsule Place 18 mcg into inhaler and inhale daily.    Historical Provider, MD  trimethoprim (TRIMPEX) 100 MG tablet  12/06/13   Historical Provider, MD    ALLERGIES:  Allergies  Allergen Reactions  . Codeine Other (See Comments)    unknown  . Lisinopril Other (See Comments)    unknown  . Penicillins Hives  . Trimethoprim   . Septra [Sulfamethoxazole-Trimethoprim] Hives and Rash  . Sulfur Hives and Rash    Break out     SOCIAL HISTORY:  History  Substance Use Topics  . Smoking status: Current Every Day Smoker -- 0.12 packs/day for 60 years    Types: Cigarettes  . Smokeless tobacco: Never Used     Comment: states she smokes 4 cigs per day  . Alcohol Use: No    FAMILY HISTORY: No family history on file.  EXAM: BP 164/60 mmHg  Pulse 68  Temp(Src) 98.9 F (37.2 C) (Oral)  Resp 18  SpO2 94% CONSTITUTIONAL: Alert and oriented and responds appropriately to questions. Well-appearing; well-nourished; GCS 83, elderly, chronically ill-appearing HEAD: Normocephalic; atraumatic EYES:  Conjunctivae clear, PERRL, EOMI ENT: normal nose; no rhinorrhea; moist mucous membranes; pharynx without lesions noted; no dental injury; no septal hematoma NECK: Supple, no meningismus, no LAD; no midline spinal tenderness, step-off or deformity CARD: RRR; S1 and S2 appreciated; no murmurs, no clicks, no rubs, no gallops RESP: Normal chest excursion without splinting or tachypnea; breath sounds clear and equal bilaterally; no wheezes, no rhonchi, no rales; chest wall stable, nontender to palpation ABD/GI: Normal bowel sounds; non-distended; soft, non-tender, no rebound, no guarding PELVIS:  stable, nontender to palpation BACK:  The back appears normal and is non-tender to palpation, there is no CVA tenderness; no midline spinal tenderness, step-off or deformity EXT: Tender to palpation over the left foot and left ankle and diffusely over the left tib/fib but noobvious bony deformity, she does have some swelling over the lateral malleolus on the left side, 2+ DP pulses bilaterally, sensation to light touch intact diffusely, decreased range of motion in the left ankle secondary to pain but otherwise Normal ROM in all joints; otherwise extremities are non-tender to palpation; no edema; normal capillary refill; no cyanosis    SKIN: Normal color for age and race; warm NEURO: Moves all extremities equally PSYCH: The patient's mood and manner are appropriate. Grooming and personal hygiene are appropriate.  MEDICAL DECISION MAKING: Patient here with fall out of her wheelchair 12 hours ago. We'll obtain x-rays of the left foot, ankle and tib/fib. Denies hitting her head. No other sign of trauma on exam. Hemodynamically stable. Neurologically intact. We'll give Vicodin for pain.  ED PROGRESS: Patient has nondisplaced medial and lateral malleoli fractures. We'll place an posterior short-leg splint with stirrups. She has a wheelchair that she uses at baseline. We'll give Vicodin for pain. We'll give orthopedic  follow-up information. Patient verbalizes understanding and is comfortable with plan. Discussed return precautions.      SPLINT APPLICATION Date/Time: 8:29 AM Authorized by: Nyra Jabs Consent: Verbal consent obtained. Risks and benefits: risks, benefits and alternatives were discussed Consent given by: patient Splint applied by: orthopedic technician Location details: Left ankle  Splint type: Short leg posterior splint with stirrups  Supplies used: Fiberglass Post-procedure: The splinted body part was neurovascularly unchanged following the procedure. Patient tolerance: Patient tolerated the procedure well with no immediate complications.     [place], DO 08/07/14 [phone]

## 2014-08-09 ENCOUNTER — Encounter (HOSPITAL_COMMUNITY): Payer: Self-pay

## 2014-08-09 ENCOUNTER — Emergency Department (HOSPITAL_COMMUNITY): Payer: Medicare HMO

## 2014-08-09 ENCOUNTER — Inpatient Hospital Stay (HOSPITAL_COMMUNITY)
Admission: EM | Admit: 2014-08-09 | Discharge: 2014-08-13 | DRG: 682 | Disposition: A | Discharge status: Skilled Nursing Facility | Payer: Medicare HMO | Attending: Internal Medicine | Admitting: Internal Medicine

## 2014-08-09 DIAGNOSIS — E785 Hyperlipidemia, unspecified: Secondary | ICD-10-CM | POA: Diagnosis present

## 2014-08-09 DIAGNOSIS — E876 Hypokalemia: Secondary | ICD-10-CM | POA: Diagnosis present

## 2014-08-09 DIAGNOSIS — W19XXXA Unspecified fall, initial encounter: Secondary | ICD-10-CM | POA: Diagnosis present

## 2014-08-09 DIAGNOSIS — K219 Gastro-esophageal reflux disease without esophagitis: Secondary | ICD-10-CM | POA: Diagnosis present

## 2014-08-09 DIAGNOSIS — Z7982 Long term (current) use of aspirin: Secondary | ICD-10-CM

## 2014-08-09 DIAGNOSIS — E11649 Type 2 diabetes mellitus with hypoglycemia without coma: Secondary | ICD-10-CM | POA: Diagnosis present

## 2014-08-09 DIAGNOSIS — Z90710 Acquired absence of both cervix and uterus: Secondary | ICD-10-CM

## 2014-08-09 DIAGNOSIS — N390 Urinary tract infection, site not specified: Secondary | ICD-10-CM | POA: Diagnosis present

## 2014-08-09 DIAGNOSIS — R1310 Dysphagia, unspecified: Secondary | ICD-10-CM | POA: Diagnosis present

## 2014-08-09 DIAGNOSIS — I129 Hypertensive chronic kidney disease with stage 1 through stage 4 chronic kidney disease, or unspecified chronic kidney disease: Secondary | ICD-10-CM | POA: Diagnosis present

## 2014-08-09 DIAGNOSIS — Z9841 Cataract extraction status, right eye: Secondary | ICD-10-CM

## 2014-08-09 DIAGNOSIS — E7800 Pure hypercholesterolemia, unspecified: Secondary | ICD-10-CM | POA: Diagnosis present

## 2014-08-09 DIAGNOSIS — N183 Chronic kidney disease, stage 3 unspecified: Secondary | ICD-10-CM | POA: Diagnosis present

## 2014-08-09 DIAGNOSIS — I2510 Atherosclerotic heart disease of native coronary artery without angina pectoris: Secondary | ICD-10-CM | POA: Diagnosis present

## 2014-08-09 DIAGNOSIS — R0989 Other specified symptoms and signs involving the circulatory and respiratory systems: Secondary | ICD-10-CM

## 2014-08-09 DIAGNOSIS — G9340 Encephalopathy, unspecified: Secondary | ICD-10-CM | POA: Diagnosis present

## 2014-08-09 DIAGNOSIS — Z886 Allergy status to analgesic agent status: Secondary | ICD-10-CM

## 2014-08-09 DIAGNOSIS — E162 Hypoglycemia, unspecified: Secondary | ICD-10-CM | POA: Diagnosis present

## 2014-08-09 DIAGNOSIS — I5189 Other ill-defined heart diseases: Secondary | ICD-10-CM | POA: Diagnosis present

## 2014-08-09 DIAGNOSIS — S96912A Strain of unspecified muscle and tendon at ankle and foot level, left foot, initial encounter: Secondary | ICD-10-CM | POA: Diagnosis present

## 2014-08-09 DIAGNOSIS — Z961 Presence of intraocular lens: Secondary | ICD-10-CM | POA: Diagnosis present

## 2014-08-09 DIAGNOSIS — I5032 Chronic diastolic (congestive) heart failure: Secondary | ICD-10-CM | POA: Diagnosis present

## 2014-08-09 DIAGNOSIS — S82892A Other fracture of left lower leg, initial encounter for closed fracture: Secondary | ICD-10-CM | POA: Diagnosis present

## 2014-08-09 DIAGNOSIS — E039 Hypothyroidism, unspecified: Secondary | ICD-10-CM | POA: Diagnosis present

## 2014-08-09 DIAGNOSIS — Z880 Allergy status to penicillin: Secondary | ICD-10-CM

## 2014-08-09 DIAGNOSIS — Z888 Allergy status to other drugs, medicaments and biological substances status: Secondary | ICD-10-CM

## 2014-08-09 DIAGNOSIS — Z8673 Personal history of transient ischemic attack (TIA), and cerebral infarction without residual deficits: Secondary | ICD-10-CM | POA: Diagnosis not present

## 2014-08-09 DIAGNOSIS — J449 Chronic obstructive pulmonary disease, unspecified: Secondary | ICD-10-CM | POA: Diagnosis present

## 2014-08-09 DIAGNOSIS — I519 Heart disease, unspecified: Secondary | ICD-10-CM

## 2014-08-09 DIAGNOSIS — S92902A Unspecified fracture of left foot, initial encounter for closed fracture: Secondary | ICD-10-CM

## 2014-08-09 DIAGNOSIS — I6529 Occlusion and stenosis of unspecified carotid artery: Secondary | ICD-10-CM | POA: Diagnosis present

## 2014-08-09 DIAGNOSIS — Z9842 Cataract extraction status, left eye: Secondary | ICD-10-CM

## 2014-08-09 DIAGNOSIS — Z882 Allergy status to sulfonamides status: Secondary | ICD-10-CM

## 2014-08-09 DIAGNOSIS — E1142 Type 2 diabetes mellitus with diabetic polyneuropathy: Secondary | ICD-10-CM | POA: Diagnosis present

## 2014-08-09 DIAGNOSIS — F17210 Nicotine dependence, cigarettes, uncomplicated: Secondary | ICD-10-CM | POA: Diagnosis present

## 2014-08-09 DIAGNOSIS — I739 Peripheral vascular disease, unspecified: Secondary | ICD-10-CM | POA: Diagnosis present

## 2014-08-09 DIAGNOSIS — E119 Type 2 diabetes mellitus without complications: Secondary | ICD-10-CM | POA: Diagnosis not present

## 2014-08-09 DIAGNOSIS — I10 Essential (primary) hypertension: Secondary | ICD-10-CM | POA: Diagnosis present

## 2014-08-09 DIAGNOSIS — N179 Acute kidney failure, unspecified: Principal | ICD-10-CM | POA: Diagnosis present

## 2014-08-09 DIAGNOSIS — G459 Transient cerebral ischemic attack, unspecified: Secondary | ICD-10-CM | POA: Diagnosis present

## 2014-08-09 DIAGNOSIS — E1165 Type 2 diabetes mellitus with hyperglycemia: Secondary | ICD-10-CM | POA: Diagnosis present

## 2014-08-09 LAB — URINALYSIS, ROUTINE W REFLEX MICROSCOPIC
Bilirubin Urine: NEGATIVE
Glucose, UA: NEGATIVE mg/dL
Ketones, ur: NEGATIVE mg/dL
NITRITE: NEGATIVE
PH: 5.5 (ref 5.0–8.0)
Protein, ur: 30 mg/dL — AB
SPECIFIC GRAVITY, URINE: 1.012 (ref 1.005–1.030)
Urobilinogen, UA: 0.2 mg/dL (ref 0.0–1.0)

## 2014-08-09 LAB — CBC WITH DIFFERENTIAL/PLATELET
BASOS ABS: 0 10*3/uL (ref 0.0–0.1)
BASOS PCT: 0 % (ref 0–1)
EOS ABS: 0.1 10*3/uL (ref 0.0–0.7)
EOS PCT: 0 % (ref 0–5)
HEMATOCRIT: 35.9 % — AB (ref 36.0–46.0)
Hemoglobin: 10.8 g/dL — ABNORMAL LOW (ref 12.0–15.0)
Lymphocytes Relative: 14 % (ref 12–46)
Lymphs Abs: 2 10*3/uL (ref 0.7–4.0)
MCH: 28.1 pg (ref 26.0–34.0)
MCHC: 30.1 g/dL (ref 30.0–36.0)
MCV: 93.2 fL (ref 78.0–100.0)
MONO ABS: 1.1 10*3/uL — AB (ref 0.1–1.0)
MONOS PCT: 8 % (ref 3–12)
Neutro Abs: 11.6 10*3/uL — ABNORMAL HIGH (ref 1.7–7.7)
Neutrophils Relative %: 78 % — ABNORMAL HIGH (ref 43–77)
Platelets: 225 10*3/uL (ref 150–400)
RBC: 3.85 MIL/uL — ABNORMAL LOW (ref 3.87–5.11)
RDW: 14.1 % (ref 11.5–15.5)
WBC: 14.8 10*3/uL — AB (ref 4.0–10.5)

## 2014-08-09 LAB — CBG MONITORING, ED
GLUCOSE-CAPILLARY: 83 mg/dL (ref 70–99)
GLUCOSE-CAPILLARY: 163 mg/dL — AB (ref 70–99)
GLUCOSE-CAPILLARY: 153 mg/dL — AB (ref 70–99)
Glucose-Capillary: 100 mg/dL — ABNORMAL HIGH (ref 70–99)
Glucose-Capillary: 148 mg/dL — ABNORMAL HIGH (ref 70–99)
Glucose-Capillary: 179 mg/dL — ABNORMAL HIGH (ref 70–99)
Glucose-Capillary: 74 mg/dL (ref 70–99)

## 2014-08-09 LAB — I-STAT VENOUS BLOOD GAS, ED
Acid-Base Excess: 1 mmol/L (ref 0.0–2.0)
Bicarbonate: 28.6 mEq/L — ABNORMAL HIGH (ref 20.0–24.0)
O2 Saturation: 94 %
PCO2 VEN: 55.2 mmHg — AB (ref 45.0–50.0)
PH VEN: 7.322 — AB (ref 7.250–7.300)
PO2 VEN: 80 mmHg — AB (ref 30.0–45.0)
TCO2: 30 mmol/L (ref 0–100)

## 2014-08-09 LAB — COMPREHENSIVE METABOLIC PANEL
ALK PHOS: 62 U/L (ref 39–117)
ALT: 10 U/L (ref 0–35)
AST: 19 U/L (ref 0–37)
Albumin: 2.9 g/dL — ABNORMAL LOW (ref 3.5–5.2)
Anion gap: 8 (ref 5–15)
BILIRUBIN TOTAL: 0.6 mg/dL (ref 0.3–1.2)
BUN: 50 mg/dL — AB (ref 6–23)
CHLORIDE: 103 mmol/L (ref 96–112)
CO2: 30 mmol/L (ref 19–32)
CREATININE: 2.5 mg/dL — AB (ref 0.50–1.10)
Calcium: 9.1 mg/dL (ref 8.4–10.5)
GFR calc Af Amer: 20 mL/min — ABNORMAL LOW (ref >90)
GFR calc non Af Amer: 17 mL/min — ABNORMAL LOW (ref >90)
Glucose, Bld: 86 mg/dL (ref 70–99)
POTASSIUM: 4.2 mmol/L (ref 3.5–5.1)
SODIUM: 141 mmol/L (ref 135–145)
TOTAL PROTEIN: 7 g/dL (ref 6.0–8.3)

## 2014-08-09 LAB — URINE MICROSCOPIC-ADD ON

## 2014-08-09 MED ORDER — DEXTROSE 50 % IV SOLN
1.0000 | Freq: Once | INTRAVENOUS | Status: AC
  Administered 2014-08-09: 50 mL via INTRAVENOUS
  Filled 2014-08-09: qty 50

## 2014-08-09 MED ORDER — IPRATROPIUM-ALBUTEROL 0.5-2.5 (3) MG/3ML IN SOLN
3.0000 mL | Freq: Once | RESPIRATORY_TRACT | Status: AC
  Administered 2014-08-09: 3 mL via RESPIRATORY_TRACT
  Filled 2014-08-09: qty 3

## 2014-08-09 MED ORDER — HYDROCODONE-ACETAMINOPHEN 5-325 MG PO TABS
1.0000 | ORAL_TABLET | Freq: Once | ORAL | Status: AC
  Administered 2014-08-09: 1 via ORAL
  Filled 2014-08-09: qty 1

## 2014-08-09 MED ORDER — DEXTROSE 50 % IV SOLN: 1.0000 | INTRAVENOUS | Status: DC | PRN

## 2014-08-09 MED ORDER — DEXTROSE 5 % IV SOLN
1.0000 g | Freq: Once | INTRAVENOUS | Status: AC
  Administered 2014-08-09: 1 g via INTRAVENOUS
  Filled 2014-08-09: qty 10

## 2014-08-09 MED ORDER — FUROSEMIDE 10 MG/ML IJ SOLN
40.0000 mg | Freq: Once | INTRAMUSCULAR | Status: AC
  Administered 2014-08-09: 40 mg via INTRAVENOUS
  Filled 2014-08-09: qty 4

## 2014-08-09 NOTE — ED Notes (Signed)
CBG Taken = 163

## 2014-08-09 NOTE — ED Notes (Signed)
Pt from [facility].  Pt brought in by EMS for hypoglycemia.  EMS were called out for unresponsiveness.  PT had CBG of 35 upon EMS arrival.  1 amp of D50 given PTA and pt last CBG was 141.  Pt is A&Ox4 at this time.  Pt is also having SOB with some wheezing and rales.

## 2014-08-09 NOTE — ED Notes (Signed)
CBG Taken = 179

## 2014-08-09 NOTE — ED Notes (Signed)
Pt difficult to arouse, but will squeeze hands, equal grip noted.

## 2014-08-09 NOTE — ED Provider Notes (Signed)
CSN: [id number]     Arrival date & time 08/09/14  1830 History   First MD Initiated Contact with Patient 08/09/14 [place]     Chief Complaint  Patient presents with  . Hypoglycemia     (Consider location/radiation/quality/duration/timing/severity/associated sxs/prior Treatment) HPI [name] is a 80 year old female with past medical history of hypertension, COPD, diabetes, CKD, CAD, TIA who presents the ER by EMS after an episode of hypoglycemia. EMS reports they were called for patient being unresponsive in her nursing home/rehabilitation facility. They report patient was initially cool, pale, diaphoretic with a CBG of 35. They initiated IV and gave D50 25 g on scene. They report patient's appearance and mental status improved almost immediately. On exam patient can answer questions to person only, and can answer yes/no questions appropriately.  Patient denies having any specific complaints on exam.  Past Medical History  Diagnosis Date  . Hypertension   . Macular degeneration, bilateral   . Type II diabetes mellitus   . COPD (chronic obstructive pulmonary disease)   . Diabetic peripheral neuropathy   . Hypercholesteremia   . PAD (peripheral artery disease)     a. 1997 s/p R fem-pop bypass;  b. 03/1999 R CIA stenting.  . CKD (chronic kidney disease), stage III   . Headache(784.0)   . Hypothyroid   . Non-obstructive CAD     a. 11/2005 Cath: LM nl, LAD 30 diffuse, LCX small, 20p, 73m/d, 75d into OM, RCA dominant, 40p, 30-40 diffuse, EF 55%.  Marland Kitchen TIA (transient ischemic attack)     a. 09/2013.  . H/O echocardiogram     a. 09/2013 Echo: nl LV fxn, no rwma, mildly to mod dil LA, mildly dil RA.  . Carotid arterial disease     a. 09/2013 Carotid U/S: 40-59% bilat ICA stenosis.  . Enterococcus UTI     a. 09/2013.  . Tobacco abuse   . Cholelithiasis   . Pyelonephritis    Past Surgical History  Procedure Laterality Date  . Tonsillectomy  1940's  . Abdominal hysterectomy  1970's  .  Dilation and curettage of uterus  1970's    "probably 2" (11/01/2012)  . Cataract extraction w/ intraocular lens  implant, bilateral Bilateral ~ 2010  . Bypass graft Right ~ 1997    RLE by Dr. Gwenlyn Perking 11/29/2005 (11/01/2012)  . Cystostomy w/ bladder biopsy  2005    Archie Endo 09/19/2003 (11/01/2012)  . Anterior cervical decomp/discectomy fusion  01/02/2006    Archie Endo 01/02/2006 (11/01/2012)  . Shoulder open rotator cuff repair Bilateral 1990's-2000's    "3X on the left; twice on the right" (11/01/2012)  . Cardiac catheterization  11/29/2005    Archie Endo 11/29/2005 (11/01/2012)  . Esophagogastroduodenoscopy N/A 10/14/2013    Procedure: ESOPHAGOGASTRODUODENOSCOPY (EGD);  Surgeon: Missy Sabins, MD;  Location: [facility];  Service: Endoscopy;  Laterality: N/A;   History reviewed. No pertinent family history. History  Substance Use Topics  . Smoking status: Current Every Day Smoker -- 0.12 packs/day for 60 years    Types: Cigarettes  . Smokeless tobacco: Never Used     Comment: states she smokes 4 cigs per day  . Alcohol Use: No   OB History    No data available     Review of Systems  Unable to perform ROS: Acuity of condition  Endocrine:       Hypoglycemia      Allergies  Ace inhibitors; Codeine; Lisinopril; Penicillins; Trimethoprim; Septra; and Sulfur  Home Medications   Prior to Admission medications  Medication Sig Start Date End Date Taking? Authorizing Provider  acetaminophen (TYLENOL) 500 MG tablet Take 500 mg by mouth 3 (three) times daily. 8am 12pm, 4pm   Yes Historical Provider, MD  albuterol (PROVENTIL) (2.5 MG/3ML) 0.083% nebulizer solution Take 2.5 mg by nebulization every 6 (six) hours as needed for wheezing.   Yes Historical Provider, MD  alum & mag hydroxide-simeth (MAALOX/MYLANTA) 200-200-20 MG/5ML suspension Take 30 mLs by mouth every 6 (six) hours as needed for indigestion or heartburn. Do not exceed 4 doses in 24 hours   Yes Historical Provider, MD  amitriptyline  (ELAVIL) 50 MG tablet Take 50 mg by mouth at bedtime.   Yes Historical Provider, MD  amLODipine (NORVASC) 10 MG tablet Take 10 mg by mouth at bedtime.    Yes Historical Provider, MD  aspirin 81 MG chewable tablet Chew 81 mg by mouth daily.   Yes Historical Provider, MD  B Complex-C (B-COMPLEX WITH VITAMIN C) tablet Take 1 tablet by mouth daily.   Yes Historical Provider, MD  bisacodyl (DULCOLAX) 10 MG suppository Place 10 mg rectally as needed for moderate constipation.   Yes Historical Provider, MD  carvedilol (COREG) 25 MG tablet Take 25 mg by mouth 2 (two) times daily with a meal. 0800 and 1700   Yes Historical Provider, MD  cephALEXin (KEFLEX) 500 MG capsule Take 500 mg by mouth 3 (three) times daily. 7 day course started 08/06/14   Yes Historical Provider, MD  cloNIDine (CATAPRES) 0.2 MG tablet Take 0.2 mg by mouth 2 (two) times daily. Hold if blood pressure less than 100/60   Yes Historical Provider, MD  Cranberry 500 MG CAPS Take 1,000 mg by mouth 2 (two) times daily.   Yes Historical Provider, MD  escitalopram (LEXAPRO) 20 MG tablet Take 20 mg by mouth daily.   Yes Historical Provider, MD  furosemide (LASIX) 40 MG tablet Take 40 mg by mouth 2 (two) times daily. 8am 2pm   Yes Historical Provider, MD  gabapentin (NEURONTIN) 300 MG capsule Take 300 mg by mouth 3 (three) times daily.   Yes Historical Provider, MD  guaiFENesin (ROBITUSSIN) 100 MG/5ML liquid Take 100 mg by mouth every 6 (six) hours as needed for cough. Not to exceed 4 doses in 24 hours   Yes Historical Provider, MD  HYDROcodone-acetaminophen (NORCO/VICODIN) 5-325 MG per tablet Take 1-2 tablets by mouth every 6 (six) hours as needed. Patient taking differently: Take 1-2 tablets by mouth every 6 (six) hours as needed (pain).  08/07/14  Yes Kristen N Ward, DO  insulin glargine (LANTUS) 100 UNIT/ML injection Inject 60 Units into the skin at bedtime.    Yes Historical Provider, MD  insulin lispro (HUMALOG) 100 UNIT/ML injection Inject  0-22 Units into the skin 3 (three) times daily with meals. CBG 0-90 0 units, 91-150 6 units, 151-200 10 units, 201-250 12 units, 251-300 14 units, 301-350 16 units, 351-400 18 units, 401-450 22 units, 451 or more 22 units and call md   Yes Historical Provider, MD  levothyroxine (SYNTHROID, LEVOTHROID) 50 MCG tablet Take 50 mcg by mouth daily before breakfast.    Yes Historical Provider, MD  loperamide (IMODIUM) 2 MG capsule Take 2 mg by mouth as needed for diarrhea or loose stools. Do not exceed 8 doses in 24 hours   Yes Historical Provider, MD  loratadine (CLARITIN) 10 MG tablet Take 10 mg by mouth daily.   Yes Historical Provider, MD  magnesium hydroxide (MILK OF MAGNESIA) 400 MG/5ML suspension Take 30 mLs by  mouth daily as needed for mild constipation.   Yes Historical Provider, MD  Menthol-Zinc Oxide (RISAMINE) 0.44-20.625 % OINT Apply 1 application topically as needed (apply with each incontinence change).    Yes Historical Provider, MD  Multiple Vitamins-Minerals (PRESERVISION AREDS) CAPS Take 1 capsule by mouth daily.   Yes Historical Provider, MD  ondansetron (ZOFRAN ODT) 4 MG disintegrating tablet Take 1 tablet (4 mg total) by mouth every 8 (eight) hours as needed for nausea or vomiting. 08/07/14  Yes Kristen N Ward, DO  ondansetron (ZOFRAN-ODT) 8 MG disintegrating tablet Take 8 mg by mouth every 8 (eight) hours as needed for nausea or vomiting.   Yes Historical Provider, MD  pantoprazole (PROTONIX) 40 MG tablet Take 1 tablet (40 mg total) by mouth daily. 10/16/13  Yes Maryann Mikhail, DO  Polyethyl Glycol-Propyl Glycol 0.4-0.3 % SOLN Place 1 drop into both eyes 3 (three) times daily. Systane PF   Yes Historical Provider, MD  polyethylene glycol (MIRALAX / GLYCOLAX) packet Take 17 g by mouth daily. Mix in 8 oz liquid and drink   Yes Historical Provider, MD  rosuvastatin (CRESTOR) 20 MG tablet Take 20 mg by mouth every evening. 4pm   Yes Historical Provider, MD  senna-docusate (SENOKOT-S) 8.6-50 MG  per tablet Take 2 tablets by mouth 2 (two) times daily.    Yes Historical Provider, MD  Skin Protectants, Misc. (MINERIN) CREA Apply 1 application topically 2 (two) times daily. Apply to both lower extremities and feet twice daily   Yes Historical Provider, MD  Vitamin D, Ergocalciferol, (DRISDOL) 50000 UNITS CAPS capsule Take 50,000 Units by mouth every 7 (seven) days. On Saturdays   Yes Historical Provider, MD  ALPRAZolam (XANAX) 0.25 MG tablet Take 1 tablet (0.25 mg total) by mouth 2 (two) times daily as needed for anxiety. Patient not taking: Reported on 08/07/2014 10/16/13   Cristal Ford, DO  aspirin EC 325 MG tablet Take 1 tablet (325 mg total) by mouth daily. Patient not taking: Reported on 08/07/2014 01/16/14   Philmore Pali, NP  ciprofloxacin (CIPRO) 500 MG tablet Take 1 tablet (500 mg total) by mouth 2 (two) times daily. Patient not taking: Reported on 08/07/2014 11/22/13   Linton Flemings, MD  docusate sodium (COLACE) 100 MG capsule Take 1 capsule (100 mg total) by mouth every 12 (twelve) hours. Patient not taking: Reported on 08/09/2014 08/07/14   Delice Bison Ward, DO  lactose free nutrition (BOOST PLUS) LIQD Take 237 mLs by mouth 3 (three) times daily with meals. Patient not taking: Reported on 08/09/2014 10/16/13   Maryann Mikhail, DO   BP 156/39 mmHg  Pulse 59  Temp(Src) 97.8 F (36.6 C) (Oral)  Resp 15  Ht 5\' 5"  (1.651 m)  Wt 196 lb (88.905 kg)  BMI 32.62 kg/m2  SpO2 99% Physical Exam  Constitutional: She appears well-developed and well-nourished. No distress.  HENT:  Head: Normocephalic and atraumatic.  Mouth/Throat: Oropharynx is clear and moist. No oropharyngeal exudate.  Eyes: Right eye exhibits no discharge. Left eye exhibits no discharge. No scleral icterus.  Neck: Normal range of motion.  Cardiovascular: Normal rate, regular rhythm and normal heart sounds.   No murmur heard. Pulmonary/Chest: Effort normal. No accessory muscle usage. No tachypnea. No respiratory distress. She has  rales in the right middle field, the right lower field, the left middle field and the left lower field.  Abdominal: Soft. There is no tenderness.  Musculoskeletal: Normal range of motion. She exhibits no edema or tenderness.  Neurological: She is  alert. No cranial nerve deficit. Coordination normal. GCS eye subscore is 4. GCS verbal subscore is 4. GCS motor subscore is 6.  Patient mildly somnolent, easily arousable. Patient will answer yes/no questions appropriately cancer per questions pertaining to person only. Patient making eye contact well, obeying verbal commands.  Skin: Skin is warm and dry. No rash noted. She is not diaphoretic.  Psychiatric: She has a normal mood and affect.  Nursing note and vitals reviewed.   ED Course  Procedures (including critical care time) Labs Review Labs Reviewed  CBC WITH DIFFERENTIAL/PLATELET - Abnormal; Notable for the following:    WBC 14.8 (*)    RBC 3.85 (*)    Hemoglobin 10.8 (*)    HCT 35.9 (*)    Neutrophils Relative % 78 (*)    Neutro Abs 11.6 (*)    Monocytes Absolute 1.1 (*)    All other components within normal limits  COMPREHENSIVE METABOLIC PANEL - Abnormal; Notable for the following:    BUN 50 (*)    Creatinine, Ser 2.50 (*)    Albumin 2.9 (*)    GFR calc non Af Amer 17 (*)    GFR calc Af Amer 20 (*)    All other components within normal limits  URINALYSIS, ROUTINE W REFLEX MICROSCOPIC - Abnormal; Notable for the following:    APPearance CLOUDY (*)    Hgb urine dipstick SMALL (*)    Protein, ur 30 (*)    Leukocytes, UA LARGE (*)    All other components within normal limits  URINE MICROSCOPIC-ADD ON - Abnormal; Notable for the following:    Bacteria, UA FEW (*)    All other components within normal limits  CBG MONITORING, ED - Abnormal; Notable for the following:    Glucose-Capillary 100 (*)    All other components within normal limits  CBG MONITORING, ED - Abnormal; Notable for the following:    Glucose-Capillary 163 (*)     All other components within normal limits  CBG MONITORING, ED - Abnormal; Notable for the following:    Glucose-Capillary 153 (*)    All other components within normal limits  CBG MONITORING, ED - Abnormal; Notable for the following:    Glucose-Capillary 148 (*)    All other components within normal limits  CBG MONITORING, ED - Abnormal; Notable for the following:    Glucose-Capillary 179 (*)    All other components within normal limits  I-STAT VENOUS BLOOD GAS, ED - Abnormal; Notable for the following:    pH, Ven 7.322 (*)    pCO2, Ven 55.2 (*)    pO2, Ven 80.0 (*)    Bicarbonate 28.6 (*)    All other components within normal limits  URINE CULTURE  CULTURE, BLOOD (ROUTINE X 2)  CULTURE, BLOOD (ROUTINE X 2)  MRSA PCR SCREENING  BRAIN NATRIURETIC PEPTIDE  BLOOD GAS, ARTERIAL  CREATININE, URINE, RANDOM  UREA NITROGEN, URINE  TROPONIN I  TROPONIN I  TROPONIN I  PROTIME-INR  COMPREHENSIVE METABOLIC PANEL  CBC  CBG MONITORING, ED  CBG MONITORING, ED  CBG MONITORING, ED  CBG MONITORING, ED  CBG MONITORING, ED  CBG MONITORING, ED  CBG MONITORING, ED  CBG MONITORING, ED  CBG MONITORING, ED  CBG MONITORING, ED  CBG MONITORING, ED  CBG MONITORING, ED  CBG MONITORING, ED  CBG MONITORING, ED  CBG MONITORING, ED  CBG MONITORING, ED  CBG MONITORING, ED  CBG MONITORING, ED  CBG MONITORING, ED  CBG MONITORING, ED  CBG MONITORING, ED  CBG MONITORING, ED  CBG MONITORING, ED  CBG MONITORING, ED  CBG MONITORING, ED  CBG MONITORING, ED    Imaging Review Dg Chest Port 1 View  08/09/2014   CLINICAL DATA:  Abnormal lungs sounds. Found unresponsive. Shortness of breath, wheezing.  EXAM: PORTABLE CHEST - 1 VIEW  COMPARISON:  11/22/2013  FINDINGS: Heart is upper limits normal in size. No confluent airspace opacities or effusions. No acute bony abnormality.  IMPRESSION: No active disease.   Electronically Signed   By: Rolm Baptise M.D.   On: 08/09/2014 20:47     EKG  Interpretation   Date/Time:  Saturday August 09 2014 18:31:25 EDT Ventricular Rate:  57 PR Interval:  224 QRS Duration: 145 QT Interval:  494 QTC Calculation: 481 R Axis:   -17 Text Interpretation:  Sinus rhythm Prolonged PR interval Right bundle  branch block Probable lateral infarct, old Confirmed by COOK  MD, BRIAN  [phone]) on 08/09/2014 7:55:17 PM Also confirmed by Lacinda Axon  MD, [place] ([id number])   on 08/09/2014 7:57:58 PM      MDM   Final diagnoses:  Abnormal lung sounds  UTI (lower urinary tract infection)  Hypoglycemia    Patient here after episode of hypoglycemia at nursing home. Patient noted to be somewhat somnolent on exam, will answer yes/no questions appropriately, however has to be frequently aroused. Patient's lung exam is remarkable for what seems to be fluid overload, will follow up with Lasix 40 mg. Patient's chest x-ray unremarkable for acute pathology. After Lasix, breathing treatment, second and D50 with patient CBG down into the 80s, patient's mental status improved and patient was able to answer questions more appropriately. Patient was noted to have UTI, mild leukocytosis of 14. Patient also had elevation of BUN and creatinine. Creatinine. Creatinine increased from 1.697/2015-2.50 tonight, 3/16. Patient to be admitted for hypoglycemia as well as UTI, elevated creatinine, new oxygen requirement tonight with possible CHF exacerbation. I spoke with Dr. Blaine Hamper who agreed to admit patient to stepdown. The patient appears reasonably stabilized for admission considering the current resources, flow, and capabilities available in the ED at this time, and I doubt any other [facility] requiring further screening and/or treatment in the ED prior to admission.  BP 156/39 mmHg  Pulse 59  Temp(Src) 97.8 F (36.6 C) (Oral)  Resp 15  Ht 5\' 5"  (1.651 m)  Wt 196 lb (88.905 kg)  BMI 32.62 kg/m2  SpO2 99%  Signed,  Dahlia Bailiff, PA-C 12:24 AM  Patient seen and discussed with Dr. Nat Christen,  MD    Dahlia Bailiff, PA-C 08/10/14 [id number]  Nat Christen, MD 08/13/14 1225

## 2014-08-09 NOTE — ED Notes (Signed)
Placed elastic foley leg strap on patient.

## 2014-08-09 NOTE — ED Notes (Signed)
Dr Cook at bedside

## 2014-08-09 NOTE — ED Notes (Signed)
Joe PA at bedside 

## 2014-08-09 NOTE — ED Notes (Signed)
Pt on 3L [place] 

## 2014-08-09 NOTE — ED Notes (Signed)
Pt is responding to questions and alert

## 2014-08-09 NOTE — ED Notes (Signed)
Bladder scan 655 ml

## 2014-08-09 NOTE — ED Notes (Addendum)
Pt had period of unresponsiveness that lasted aprox. 2 minutes.  Pt did not respond to sternal rub but was able to squeeze RN hands when asked.  Pt became responsive and reported that she could hear RN speaking to her but was just unable to respond.  CBG rechecked and was WNL.  Truitt Leep PA made aware of situation.

## 2014-08-09 NOTE — H&P (Signed)
[facility] History and Physical  [name] ZDG:[id number] DOB: 23-Jun-1934 DOA: 08/09/2014  Referring physician: ED physician PCP: Mayra Neer, MD  Specialists:   Chief Complaint: AMS and hypoglycemia  HPI: [name] is a 80 y.o. female with past medical history of hypertension, hyperlipidemia, GERD, hypothyroidism, diabetes mellitus, COPD, peripheral vascular disease, chronic kidney disease-stage III, history of TIA, carotid artery stenosis, history of tobacco abuse, congestive heart failure, who presents with altered mental status and hypokalemia.  Patient has altered mental status, history is obtained from EMS and ED's report. It seems that patient was noticed being unresponsive by staff in nursing home. EMS was called. They report patient was initially cool, pale, diaphoretic. Patient was found to have CBG of 35. EMS initiated IV and gave D50 25 g on scene. They report patient's appearance and mental status improved almost immediately. Per family, patient's body weight has increased a lot recently, not know exact number. When I evaluated pt in ED, she could answer some questions. She denies fever, chills, chest pain, cough, shortness of breath. She denies symptoms for UTI, but urinalysis positive for UTI. Patient denies abdominal pain, diarrhea, dysuria, urgency, frequency, hematuria, skin rashes. No unilateral weakness, numbness or tingling sensations. No vision change or hearing loss.  In ED, patient was found to have leukocytosis with WBC 14.8, positive urinalysis for UTI, AoCKD-III, chest x-ray is negative for acute abnormalities. Temperature normal. ABG showed a pH of 7.322, PCO2 55.2, PaO2 80%. Patient is admitted to inpatient for further evaluation and treatment.  Review of Systems: As presented in the history of presenting illness, rest negative.  Where does patient live?  SNF Can patient participate in ADLs? none  Allergy:  Allergies  Allergen Reactions  . Ace  Inhibitors Other (See Comments)    Listed on MAR  . Codeine Other (See Comments)    Listed on MAR  . Lisinopril Other (See Comments)    unknown  . Penicillins Hives  . Trimethoprim   . Septra [Sulfamethoxazole-Trimethoprim] Hives and Rash  . Sulfur Hives and Rash    Past Medical History  Diagnosis Date  . Hypertension   . Macular degeneration, bilateral   . Type II diabetes mellitus   . COPD (chronic obstructive pulmonary disease)   . Diabetic peripheral neuropathy   . Hypercholesteremia   . PAD (peripheral artery disease)     a. 1997 s/p R fem-pop bypass;  b. 03/1999 R CIA stenting.  . CKD (chronic kidney disease), stage III   . Headache(784.0)   . Hypothyroid   . Non-obstructive CAD     a. 11/2005 Cath: LM nl, LAD 30 diffuse, LCX small, 20p, 79m/d, 75d into OM, RCA dominant, 40p, 30-40 diffuse, EF 55%.  Marland Kitchen TIA (transient ischemic attack)     a. 09/2013.  . H/O echocardiogram     a. 09/2013 Echo: nl LV fxn, no rwma, mildly to mod dil LA, mildly dil RA.  . Carotid arterial disease     a. 09/2013 Carotid U/S: 40-59% bilat ICA stenosis.  . Enterococcus UTI     a. 09/2013.  . Tobacco abuse   . Cholelithiasis   . Pyelonephritis     Past Surgical History  Procedure Laterality Date  . Tonsillectomy  1940's  . Abdominal hysterectomy  1970's  . Dilation and curettage of uterus  1970's    "probably 2" (11/01/2012)  . Cataract extraction w/ intraocular lens  implant, bilateral Bilateral ~ 2010  . Bypass graft Right ~ 1997  RLE by Dr. Gwenlyn Perking 11/29/2005 (11/01/2012)  . Cystostomy w/ bladder biopsy  2005    Archie Endo 09/19/2003 (11/01/2012)  . Anterior cervical decomp/discectomy fusion  01/02/2006    Archie Endo 01/02/2006 (11/01/2012)  . Shoulder open rotator cuff repair Bilateral 1990's-2000's    "3X on the left; twice on the right" (11/01/2012)  . Cardiac catheterization  11/29/2005    Archie Endo 11/29/2005 (11/01/2012)  . Esophagogastroduodenoscopy N/A 10/14/2013    Procedure:  ESOPHAGOGASTRODUODENOSCOPY (EGD);  Surgeon: Missy Sabins, MD;  Location: [facility];  Service: Endoscopy;  Laterality: N/A;    Social History:  reports that she has been smoking Cigarettes.  She has a 7.2 pack-year smoking history. She has never used smokeless tobacco. She reports that she does not drink alcohol or use illicit drugs.  Family History: Asked, but pt could not tell any thing about her family medical history.  Prior to Admission medications   Medication Sig Start Date End Date Taking? Authorizing Provider  acetaminophen (TYLENOL) 500 MG tablet Take 500 mg by mouth every 4 (four) hours as needed for mild pain, fever or headache.    Historical Provider, MD  albuterol (PROVENTIL) (2.5 MG/3ML) 0.083% nebulizer solution Take 2.5 mg by nebulization every 6 (six) hours as needed for wheezing.    Historical Provider, MD  ALPRAZolam Duanne Moron) 0.25 MG tablet Take 1 tablet (0.25 mg total) by mouth 2 (two) times daily as needed for anxiety. Patient not taking: Reported on 08/07/2014 10/16/13   Cristal Ford, DO  alum & mag hydroxide-simeth (MAALOX/MYLANTA) 200-200-20 MG/5ML suspension Take 30 mLs by mouth every 6 (six) hours as needed for indigestion or heartburn. Do not exceed 4 doses in 24 hours    Historical Provider, MD  amitriptyline (ELAVIL) 50 MG tablet Take 50 mg by mouth at bedtime.    Historical Provider, MD  amLODipine (NORVASC) 10 MG tablet Take 10 mg by mouth daily.     Historical Provider, MD  aspirin 81 MG chewable tablet Chew 81 mg by mouth daily.    Historical Provider, MD  aspirin EC 325 MG tablet Take 1 tablet (325 mg total) by mouth daily. Patient not taking: Reported on 08/07/2014 01/16/14   Philmore Pali, NP  B Complex-C (B-COMPLEX WITH VITAMIN C) tablet Take 1 tablet by mouth daily.    Historical Provider, MD  bisacodyl (DULCOLAX) 10 MG suppository Place 10 mg rectally as needed for moderate constipation.    Historical Provider, MD  carvedilol (COREG) 25 MG tablet Take 25 mg by  mouth 2 (two) times daily with a meal. 0800 and 1700    Historical Provider, MD  cephALEXin (KEFLEX) 500 MG capsule Take 500 mg by mouth 3 (three) times daily.    Historical Provider, MD  ciprofloxacin (CIPRO) 500 MG tablet Take 1 tablet (500 mg total) by mouth 2 (two) times daily. Patient not taking: Reported on 08/07/2014 11/22/13   Linton Flemings, MD  cloNIDine (CATAPRES) 0.2 MG tablet Take 0.2 mg by mouth 2 (two) times daily. Hold if blood pressure less than 100/60    Historical Provider, MD  Cranberry 475 MG CAPS Take 2 capsules by mouth 2 (two) times daily.    Historical Provider, MD  docusate sodium (COLACE) 100 MG capsule Take 1 capsule (100 mg total) by mouth every 12 (twelve) hours. 08/07/14   Kristen N Ward, DO  escitalopram (LEXAPRO) 20 MG tablet Take 20 mg by mouth daily.    Historical Provider, MD  furosemide (LASIX) 40 MG tablet Take 40 mg by  mouth 2 (two) times daily.     Historical Provider, MD  gabapentin (NEURONTIN) 300 MG capsule Take 300 mg by mouth 3 (three) times daily.    Historical Provider, MD  guaiFENesin (ROBITUSSIN) 100 MG/5ML liquid Take 100 mg by mouth every 6 (six) hours as needed for cough. Not to exceed 4 doses in 24 hours    Historical Provider, MD  HYDROcodone-acetaminophen (NORCO/VICODIN) 5-325 MG per tablet Take 1-2 tablets by mouth every 6 (six) hours as needed. 08/07/14   Kristen N Ward, DO  insulin glargine (LANTUS) 100 UNIT/ML injection Inject 60 Units into the skin at bedtime.     Historical Provider, MD  insulin lispro (HUMALOG) 100 UNIT/ML injection Inject 6 Units into the skin 3 (three) times daily with meals. CBG greater than 350=14 units    Historical Provider, MD  lactose free nutrition (BOOST PLUS) LIQD Take 237 mLs by mouth 3 (three) times daily with meals. Patient taking differently: Take 237 mLs by mouth 2 (two) times daily between meals.  10/16/13   Maryann Mikhail, DO  levothyroxine (SYNTHROID, LEVOTHROID) 50 MCG tablet Take 50 mcg by mouth every morning.      Historical Provider, MD  loperamide (IMODIUM) 2 MG capsule Take 2 mg by mouth as needed for diarrhea or loose stools. Do not exceed 8 doses in 24 hours    Historical Provider, MD  loratadine (CLARITIN) 10 MG tablet Take 10 mg by mouth daily.    Historical Provider, MD  magnesium hydroxide (MILK OF MAGNESIA) 400 MG/5ML suspension Take 30 mLs by mouth daily as needed for mild constipation.    Historical Provider, MD  Menthol-Zinc Oxide (RISAMINE) 0.44-20.625 % OINT Apply 1 application topically 3 (three) times daily.    Historical Provider, MD  Multiple Vitamins-Minerals (HEALTHY EYES) TABS Take 1 tablet by mouth every morning.     Historical Provider, MD  ondansetron (ZOFRAN ODT) 4 MG disintegrating tablet Take 1 tablet (4 mg total) by mouth every 8 (eight) hours as needed for nausea or vomiting. 08/07/14   Kristen N Ward, DO  ondansetron (ZOFRAN) 8 MG tablet Take by mouth every 8 (eight) hours as needed for nausea or vomiting.    Historical Provider, MD  pantoprazole (PROTONIX) 40 MG tablet Take 1 tablet (40 mg total) by mouth daily. 10/16/13   Maryann Mikhail, DO  Polyethyl Glycol-Propyl Glycol 0.4-0.3 % SOLN Apply 1 drop to eye 3 (three) times daily.    Historical Provider, MD  polyethylene glycol (MIRALAX / GLYCOLAX) packet Take 17 g by mouth daily as needed for mild constipation, moderate constipation or severe constipation.     Historical Provider, MD  rosuvastatin (CRESTOR) 20 MG tablet Take 20 mg by mouth every evening.     Historical Provider, MD  senna-docusate (SENOKOT-S) 8.6-50 MG per tablet Take 2 tablets by mouth at bedtime.     Historical Provider, MD  Skin Protectants, Misc. (MINERIN) CREA Apply 1 each topically 2 (two) times daily.    Historical Provider, MD  Vitamin D, Ergocalciferol, (DRISDOL) 50000 UNITS CAPS capsule Take 50,000 Units by mouth every 7 (seven) days.    Historical Provider, MD    Physical Exam: Filed Vitals:   08/09/14 2215 08/09/14 2230 08/10/14 0019 08/10/14  0025  BP: 133/94 150/50 156/39   Pulse: 66 64 59   Temp:   97.8 F (36.6 C)   TempSrc:   Oral   Resp: 14 13 15    Height:    5\' 3"  (1.6 m)  Weight:  88.2 kg (194 lb 7.1 oz)  SpO2: 98% 98% 99%    General: Not in acute distress HEENT:       Eyes: PERRL, EOMI, no scleral icterus       ENT: No discharge from the ears and nose, no pharynx injection, no tonsillar enlargement.        Neck: No JVD, no bruit, no mass felt. Cardiac: S1/S2, RRR, No murmurs, No gallops or rubs Pulm: Good air movement bilaterally. Clear to auscultation bilaterally. No rales, wheezing, rhonchi or rubs. Abd: Soft, nondistended, nontender, no rebound pain, no organomegaly, BS present Ext: trace leg edema bilaterally. 2+DP/PT pulse bilaterally Musculoskeletal: left foot is wrapped up with tenderness Skin: No rashes.  Neuro: Alert and not oriented X3, cranial nerves II-XII grossly intact, moves all extremities. Psych: Unable to assess due to AMS  Labs on Admission:  Basic Metabolic Panel:  Recent Labs Lab 08/09/14 1908  NA 141  K 4.2  CL 103  CO2 30  GLUCOSE 86  BUN 50*  CREATININE 2.50*  CALCIUM 9.1   Liver Function Tests:  Recent Labs Lab 08/09/14 1908  AST 19  ALT 10  ALKPHOS 62  BILITOT 0.6  PROT 7.0  ALBUMIN 2.9*   No results for input(s): LIPASE, AMYLASE in the last 168 hours. No results for input(s): AMMONIA in the last 168 hours. CBC:  Recent Labs Lab 08/09/14 1908  WBC 14.8*  NEUTROABS 11.6*  HGB 10.8*  HCT 35.9*  MCV 93.2  PLT 225   Cardiac Enzymes:  Recent Labs Lab 08/10/14 0100  TROPONINI <0.03    BNP (last 3 results) No results for input(s): BNP in the last 8760 hours.  ProBNP (last 3 results) No results for input(s): PROBNP in the last 8760 hours.  CBG:  Recent Labs Lab 08/09/14 1934 08/09/14 2022 08/09/14 2107 08/09/14 2155 08/09/14 2335  GLUCAP 74 163* 153* 148* 179*    Radiological Exams on Admission: Dg Chest Port 1 View  08/09/2014    CLINICAL DATA:  Abnormal lungs sounds. Found unresponsive. Shortness of breath, wheezing.  EXAM: PORTABLE CHEST - 1 VIEW  COMPARISON:  11/22/2013  FINDINGS: Heart is upper limits normal in size. No confluent airspace opacities or effusions. No acute bony abnormality.  IMPRESSION: No active disease.   Electronically Signed   By: Rolm Baptise M.D.   On: 08/09/2014 20:47    EKG: Independently reviewed.   Assessment/Plan Principal Problem:   Hypoglycemia Active Problems:   Hypothyroidism   Diabetes mellitus without complication   HYPERCHOLESTEROLEMIA   Essential hypertension   CAD (coronary artery disease)   COPD (chronic obstructive pulmonary disease)   Diastolic dysfunction, grade II   CKD (chronic kidney disease) stage 3, GFR 30-59 ml/min   Acute encephalopathy   UTI (lower urinary tract infection)   TIA (transient ischemic attack)   Left ankle strain  Hypoglycemia: Etiology is not complete clear, but It is likely due to decreased oral intake secondary to UTI and continuation of Lantus. Patient responded to D50 quickly. Current sugar level>100, will not pursue hypoglycemia -will admit to SDU -hold Lantus and start sliding scale insulin -CBG every hour, and When necessary D50 -will no  Acute encephalopathy: Likely due to combination of hypoglycemia and UTI. Now already improved. -treat UTI as low -Treat hypoglycemia as above -Neuro check every 4 hours  UTI: Patient denies symptoms of UTI, but she has altered mental status. Urinalysis is positive for UTI.  -Aztreonam IV -Follow-up blood culture and urine culture  Diabetes mellitus: A1c 9.9 on 10/08/13.  -hold Lantus  -sliding scale insulin -Neurontin for neuropathy  Hypertension:  -continue amlodipine, clonidine  GERD:  -continue Protonix  Hypothyroidism: TSH was 1.155 on 06/20/13. -Continue Synthroid  Hyperlipidemia: LDL was at 2805/26/15 -Continue Crestor  Diastolic Congestive heart failure: 2-D echo on 01/27/12  showed EF 55-60% with grade 2 diastolic dysfunction. Recent 2-D echo on 10/08/13 showed normal EF without comment on diastolic dysfunction. Family reports that patient's body weight has increased a lot, not sure exactly remember. On my examination, patient volume status seems to be okay. She has only trace amount of leg edema. Patient received 1 dose of Lasix 40 mg 1 in emergency room. -will hold lasix due to AoCKD -check BNP -Continue aspirin, Coreg  Chronic kidney disease-III: Baseline creatinine 1.5-2. Her creatinine is a 2.50 on admission, likely due to prerenal secondary to decreased oral intake. -check FeUrea and Us-renal -hold lasix -follow renal fx by BMP  COPD: No acute exacerbation. -Continue home breathing treatment: Albuterol when necessary  Left ankle strain: Family reports patient has left foot fracture, but X-ray on 08/07/14 did not show any bony fracture. -pain control   DVT ppx: SQ Heparin         Code Status: Full code Family Communication: None at bed side.         Disposition Plan: Admit to inpatient   Date of Service 08/10/2014    Ivor Costa [facility] Pager [phone]  If 7PM-7AM, please contact night-coverage [email or web address] Password [email or web address] 08/10/2014, 3:52 AM

## 2014-08-09 NOTE — ED Notes (Signed)
Pt on 2.5 L [place]

## 2014-08-10 ENCOUNTER — Encounter (HOSPITAL_COMMUNITY): Payer: Self-pay | Admitting: *Deleted

## 2014-08-10 ENCOUNTER — Inpatient Hospital Stay (HOSPITAL_COMMUNITY): Payer: Medicare HMO

## 2014-08-10 DIAGNOSIS — J42 Unspecified chronic bronchitis: Secondary | ICD-10-CM

## 2014-08-10 LAB — TROPONIN I
Troponin I: <0.03 ng/mL (ref <0.031)
Troponin I: 0.03 ng/mL (ref <0.031)
Troponin I: <0.03 ng/mL (ref <0.031)

## 2014-08-10 LAB — COMPREHENSIVE METABOLIC PANEL
ALT: 12 U/L (ref 0–35)
AST: 22 U/L (ref 0–37)
Albumin: 2.9 g/dL — ABNORMAL LOW (ref 3.5–5.2)
Alkaline Phosphatase: 64 U/L (ref 39–117)
Anion gap: 13 (ref 5–15)
BUN: 50 mg/dL — ABNORMAL HIGH (ref 6–23)
CO2: 29 mmol/L (ref 19–32)
CREATININE: 2.24 mg/dL — AB (ref 0.50–1.10)
Calcium: 9.5 mg/dL (ref 8.4–10.5)
Chloride: 100 mmol/L (ref 96–112)
GFR calc Af Amer: 23 mL/min — ABNORMAL LOW (ref >90)
GFR calc non Af Amer: 20 mL/min — ABNORMAL LOW (ref >90)
GLUCOSE: 135 mg/dL — AB (ref 70–99)
POTASSIUM: 4.9 mmol/L (ref 3.5–5.1)
Sodium: 142 mmol/L (ref 135–145)
Total Bilirubin: 0.3 mg/dL (ref 0.3–1.2)
Total Protein: 7.3 g/dL (ref 6.0–8.3)

## 2014-08-10 LAB — BRAIN NATRIURETIC PEPTIDE: B Natriuretic Peptide: 170.7 pg/mL — ABNORMAL HIGH (ref 0.0–100.0)

## 2014-08-10 LAB — GLUCOSE, CAPILLARY
GLUCOSE-CAPILLARY: 104 mg/dL — AB (ref 70–99)
GLUCOSE-CAPILLARY: 264 mg/dL — AB (ref 70–99)
Glucose-Capillary: 137 mg/dL — ABNORMAL HIGH (ref 70–99)
Glucose-Capillary: 284 mg/dL — ABNORMAL HIGH (ref 70–99)

## 2014-08-10 LAB — CBC
HCT: 34.5 % — ABNORMAL LOW (ref 36.0–46.0)
Hemoglobin: 10.7 g/dL — ABNORMAL LOW (ref 12.0–15.0)
MCH: 28.6 pg (ref 26.0–34.0)
MCHC: 31 g/dL (ref 30.0–36.0)
MCV: 92.2 fL (ref 78.0–100.0)
PLATELETS: 228 10*3/uL (ref 150–400)
RBC: 3.74 MIL/uL — AB (ref 3.87–5.11)
RDW: 14 % (ref 11.5–15.5)
WBC: 13.3 10*3/uL — ABNORMAL HIGH (ref 4.0–10.5)

## 2014-08-10 LAB — CREATININE, URINE, RANDOM: Creatinine, Urine: 50.23 mg/dL

## 2014-08-10 LAB — PROTIME-INR
INR: 1.13 (ref 0.00–1.49)
PROTHROMBIN TIME: 14.6 s (ref 11.6–15.2)

## 2014-08-10 LAB — MRSA PCR SCREENING: MRSA by PCR: NEGATIVE

## 2014-08-10 MED ORDER — CLONIDINE HCL 0.2 MG PO TABS
0.2000 mg | ORAL_TABLET | Freq: Two times a day (BID) | ORAL | Status: DC
  Administered 2014-08-10 – 2014-08-13 (×7): 0.2 mg via ORAL
  Filled 2014-08-10 (×9): qty 1

## 2014-08-10 MED ORDER — POLYVINYL ALCOHOL 1.4 % OP SOLN
1.0000 [drp] | Freq: Three times a day (TID) | OPHTHALMIC | Status: DC
  Administered 2014-08-10 – 2014-08-12 (×9): 1 [drp] via OPHTHALMIC
  Filled 2014-08-10: qty 15

## 2014-08-10 MED ORDER — GABAPENTIN 300 MG PO CAPS
300.0000 mg | ORAL_CAPSULE | Freq: Three times a day (TID) | ORAL | Status: DC
  Administered 2014-08-10 – 2014-08-12 (×9): 300 mg via ORAL
  Filled 2014-08-10 (×12): qty 1

## 2014-08-10 MED ORDER — ROSUVASTATIN CALCIUM 20 MG PO TABS
20.0000 mg | ORAL_TABLET | Freq: Every evening | ORAL | Status: DC
  Administered 2014-08-10 – 2014-08-12 (×3): 20 mg via ORAL
  Filled 2014-08-10 (×4): qty 1

## 2014-08-10 MED ORDER — SENNOSIDES-DOCUSATE SODIUM 8.6-50 MG PO TABS
2.0000 | ORAL_TABLET | Freq: Every day | ORAL | Status: DC
  Administered 2014-08-10 (×2): 2 via ORAL
  Filled 2014-08-10 (×2): qty 2

## 2014-08-10 MED ORDER — ZINC OXIDE 20 % EX OINT
TOPICAL_OINTMENT | Freq: Three times a day (TID) | CUTANEOUS | Status: DC
  Administered 2014-08-10 – 2014-08-11 (×3): via TOPICAL
  Administered 2014-08-11 (×2): 1 via TOPICAL
  Administered 2014-08-12 (×3): via TOPICAL
  Administered 2014-08-13: 1 via TOPICAL
  Filled 2014-08-10: qty 28.35

## 2014-08-10 MED ORDER — PANTOPRAZOLE SODIUM 40 MG PO TBEC
40.0000 mg | DELAYED_RELEASE_TABLET | Freq: Every day | ORAL | Status: DC
  Administered 2014-08-10 – 2014-08-13 (×4): 40 mg via ORAL
  Filled 2014-08-10 (×3): qty 1

## 2014-08-10 MED ORDER — ASPIRIN 81 MG PO CHEW
81.0000 mg | CHEWABLE_TABLET | Freq: Every day | ORAL | Status: DC
  Administered 2014-08-10 – 2014-08-13 (×4): 81 mg via ORAL
  Filled 2014-08-10 (×5): qty 1

## 2014-08-10 MED ORDER — FUROSEMIDE 40 MG PO TABS
40.0000 mg | ORAL_TABLET | Freq: Two times a day (BID) | ORAL | Status: DC
  Filled 2014-08-10 (×3): qty 1

## 2014-08-10 MED ORDER — GUAIFENESIN 100 MG/5ML PO LIQD: 100.0000 mg | Freq: Four times a day (QID) | ORAL | Status: DC | PRN

## 2014-08-10 MED ORDER — ALUM & MAG HYDROXIDE-SIMETH 200-200-20 MG/5ML PO SUSP: 30.0000 mL | Freq: Four times a day (QID) | ORAL | Status: DC | PRN

## 2014-08-10 MED ORDER — HEALTHY EYES PO TABS: 1.0000 | ORAL_TABLET | Freq: Every morning | ORAL | Status: DC

## 2014-08-10 MED ORDER — AMLODIPINE BESYLATE 10 MG PO TABS
10.0000 mg | ORAL_TABLET | Freq: Every day | ORAL | Status: DC
  Administered 2014-08-10 – 2014-08-13 (×4): 10 mg via ORAL
  Filled 2014-08-10 (×4): qty 1

## 2014-08-10 MED ORDER — ESCITALOPRAM OXALATE 20 MG PO TABS
20.0000 mg | ORAL_TABLET | Freq: Every day | ORAL | Status: DC
  Administered 2014-08-10 – 2014-08-13 (×4): 20 mg via ORAL
  Filled 2014-08-10 (×4): qty 1

## 2014-08-10 MED ORDER — ALBUTEROL SULFATE (2.5 MG/3ML) 0.083% IN NEBU: 2.5000 mg | INHALATION_SOLUTION | Freq: Four times a day (QID) | RESPIRATORY_TRACT | Status: DC | PRN

## 2014-08-10 MED ORDER — HEPARIN SODIUM (PORCINE) 5000 UNIT/ML IJ SOLN
5000.0000 [IU] | Freq: Three times a day (TID) | INTRAMUSCULAR | Status: DC
  Administered 2014-08-10 – 2014-08-13 (×11): 5000 [IU] via SUBCUTANEOUS
  Filled 2014-08-10 (×13): qty 1

## 2014-08-10 MED ORDER — BOOST PLUS PO LIQD
237.0000 mL | Freq: Two times a day (BID) | ORAL | Status: DC
  Administered 2014-08-10 – 2014-08-11 (×3): 237 mL via ORAL
  Filled 2014-08-10 (×10): qty 237

## 2014-08-10 MED ORDER — MAGNESIUM HYDROXIDE 400 MG/5ML PO SUSP: 30.0000 mL | Freq: Every day | ORAL | Status: DC | PRN

## 2014-08-10 MED ORDER — CRANBERRY 475 MG PO CAPS: 2.0000 | ORAL_CAPSULE | Freq: Two times a day (BID) | ORAL | Status: DC

## 2014-08-10 MED ORDER — SODIUM CHLORIDE 0.9 % IJ SOLN
3.0000 mL | Freq: Two times a day (BID) | INTRAMUSCULAR | Status: DC
  Administered 2014-08-10 – 2014-08-13 (×6): 3 mL via INTRAVENOUS

## 2014-08-10 MED ORDER — ACETAMINOPHEN 500 MG PO TABS: 500.0000 mg | ORAL_TABLET | ORAL | Status: DC | PRN

## 2014-08-10 MED ORDER — LEVOTHYROXINE SODIUM 50 MCG PO TABS
50.0000 ug | ORAL_TABLET | Freq: Every day | ORAL | Status: DC
  Administered 2014-08-10 – 2014-08-13 (×4): 50 ug via ORAL
  Filled 2014-08-10 (×5): qty 1

## 2014-08-10 MED ORDER — HYDROCODONE-ACETAMINOPHEN 5-325 MG PO TABS
1.0000 | ORAL_TABLET | Freq: Four times a day (QID) | ORAL | Status: DC | PRN
  Administered 2014-08-11 – 2014-08-13 (×2): 1 via ORAL
  Filled 2014-08-10 (×2): qty 1

## 2014-08-10 MED ORDER — MINERIN CREME EX CREA: 1.0000 | TOPICAL_CREAM | Freq: Two times a day (BID) | CUTANEOUS | Status: DC

## 2014-08-10 MED ORDER — POLYETHYLENE GLYCOL 3350 17 G PO PACK
17.0000 g | PACK | Freq: Every day | ORAL | Status: DC | PRN
  Administered 2014-08-12: 17 g via ORAL
  Filled 2014-08-10 (×2): qty 1

## 2014-08-10 MED ORDER — POLYETHYL GLYCOL-PROPYL GLYCOL 0.4-0.3 % OP SOLN: 1.0000 [drp] | Freq: Three times a day (TID) | OPHTHALMIC | Status: DC

## 2014-08-10 MED ORDER — DOCUSATE SODIUM 100 MG PO CAPS
100.0000 mg | ORAL_CAPSULE | Freq: Two times a day (BID) | ORAL | Status: DC
  Administered 2014-08-10 – 2014-08-13 (×7): 100 mg via ORAL
  Filled 2014-08-10 (×8): qty 1

## 2014-08-10 MED ORDER — CETYLPYRIDINIUM CHLORIDE 0.05 % MT LIQD
7.0000 mL | Freq: Two times a day (BID) | OROMUCOSAL | Status: DC
  Administered 2014-08-10 – 2014-08-12 (×5): 7 mL via OROMUCOSAL

## 2014-08-10 MED ORDER — GUAIFENESIN 100 MG/5ML PO SOLN
5.0000 mL | Freq: Four times a day (QID) | ORAL | Status: DC | PRN
  Filled 2014-08-10: qty 5

## 2014-08-10 MED ORDER — HYDROCERIN EX CREA
TOPICAL_CREAM | Freq: Two times a day (BID) | CUTANEOUS | Status: DC
  Administered 2014-08-10 (×2): via TOPICAL
  Administered 2014-08-11: 1 via TOPICAL
  Administered 2014-08-11 – 2014-08-12 (×3): via TOPICAL
  Administered 2014-08-13: 1 via TOPICAL
  Filled 2014-08-10: qty 113

## 2014-08-10 MED ORDER — AMITRIPTYLINE HCL 50 MG PO TABS
50.0000 mg | ORAL_TABLET | Freq: Every day | ORAL | Status: DC
  Administered 2014-08-10 – 2014-08-12 (×4): 50 mg via ORAL
  Filled 2014-08-10 (×5): qty 1

## 2014-08-10 MED ORDER — DEXTROSE 5 % IV SOLN
500.0000 mg | Freq: Two times a day (BID) | INTRAVENOUS | Status: DC
  Administered 2014-08-10 (×3): 500 mg via INTRAVENOUS
  Filled 2014-08-10 (×5): qty 0.5

## 2014-08-10 MED ORDER — INSULIN ASPART 100 UNIT/ML ~~LOC~~ SOLN
0.0000 [IU] | Freq: Three times a day (TID) | SUBCUTANEOUS | Status: DC
  Administered 2014-08-10: 1 [IU] via SUBCUTANEOUS
  Administered 2014-08-10: 5 [IU] via SUBCUTANEOUS
  Administered 2014-08-11: 3 [IU] via SUBCUTANEOUS
  Administered 2014-08-11: 7 [IU] via SUBCUTANEOUS
  Administered 2014-08-11: 9 [IU] via SUBCUTANEOUS
  Administered 2014-08-12: 3 [IU] via SUBCUTANEOUS
  Administered 2014-08-12: 5 [IU] via SUBCUTANEOUS
  Administered 2014-08-12 – 2014-08-13 (×2): 3 [IU] via SUBCUTANEOUS
  Administered 2014-08-13: 2 [IU] via SUBCUTANEOUS

## 2014-08-10 MED ORDER — PROSIGHT PO TABS
1.0000 | ORAL_TABLET | Freq: Every day | ORAL | Status: DC
  Administered 2014-08-10 – 2014-08-13 (×4): 1 via ORAL
  Filled 2014-08-10 (×4): qty 1

## 2014-08-10 MED ORDER — B COMPLEX-C PO TABS
1.0000 | ORAL_TABLET | Freq: Every day | ORAL | Status: DC
  Administered 2014-08-10 – 2014-08-13 (×4): 1 via ORAL
  Filled 2014-08-10 (×4): qty 1

## 2014-08-10 MED ORDER — LOPERAMIDE HCL 2 MG PO CAPS: 2.0000 mg | ORAL_CAPSULE | ORAL | Status: DC | PRN

## 2014-08-10 MED ORDER — LORATADINE 10 MG PO TABS
10.0000 mg | ORAL_TABLET | Freq: Every day | ORAL | Status: DC
  Administered 2014-08-10 – 2014-08-13 (×4): 10 mg via ORAL
  Filled 2014-08-10 (×4): qty 1

## 2014-08-10 MED ORDER — CARVEDILOL 25 MG PO TABS
25.0000 mg | ORAL_TABLET | Freq: Two times a day (BID) | ORAL | Status: DC
  Administered 2014-08-10 – 2014-08-13 (×6): 25 mg via ORAL
  Filled 2014-08-10 (×9): qty 1

## 2014-08-10 MED ORDER — MENTHOL-ZINC OXIDE 0.44-20.625 % EX OINT: 1.0000 "application " | TOPICAL_OINTMENT | Freq: Three times a day (TID) | CUTANEOUS | Status: DC

## 2014-08-10 NOTE — Evaluation (Signed)
Physical Therapy Evaluation Patient Details Name: [name] MRN: [id number] DOB: 01/20/1935 Today's Date: 08/10/2014   History of Present Illness  Patient admitted with hypoglycemia; PMH includes COPD, TIA, CHF and recent ankle sprain/fx? (xray 08/09/14 negative for fx)  Clinical Impression  Per patient she was at w/c level before admission.  Currently patient requiring assistance to sit edge of bed.  Patient will benefit from PT to progress balance and mobility back to baseline.      Follow Up Recommendations SNF    Equipment Recommendations  None recommended by PT    Recommendations for Other Services       Precautions / Restrictions Precautions Precautions: Fall Restrictions Weight Bearing Restrictions: Yes LLE Weight Bearing: Non weight bearing      Mobility  Bed Mobility Overal bed mobility: Needs Assistance Bed Mobility: Supine to Sit;Sit to Supine     Supine to sit: Mod assist Sit to supine: Mod assist      Transfers                    Ambulation/Gait                Stairs            Wheelchair Mobility    Modified Rankin (Stroke Patients Only)       Balance Overall balance assessment: Needs assistance Sitting-balance support: Bilateral upper extremity supported Sitting balance-Leahy Scale: Zero Sitting balance - Comments: patient unable to maintain midline even with tacile facilitation and/or verbal cues.   Patient leaning to right throughout sitting EOB                                     Pertinent Vitals/Pain Pain Assessment: No/denies pain    Home Living Family/patient expects to be discharged to:: Skilled nursing facility                 Additional Comments: [facility]    Prior Function Level of Independence: Needs assistance   Gait / Transfers Assistance Needed: pt reports she is w/c bound           Hand Dominance        Extremity/Trunk Assessment   Upper Extremity  Assessment: Generalized weakness           Lower Extremity Assessment: Generalized weakness         Communication   Communication: No difficulties  Cognition Arousal/Alertness: Awake/alert Behavior During Therapy: WFL for tasks assessed/performed Overall Cognitive Status: No family/caregiver present to determine baseline cognitive functioning (no apparent cognitive deficits)                      General Comments      Exercises        Assessment/Plan    PT Assessment Patient needs continued PT services  PT Diagnosis Generalized weakness   PT Problem List Decreased strength;Decreased activity tolerance;Decreased balance;Decreased mobility;Obesity  PT Treatment Interventions Functional mobility training;Therapeutic activities;Therapeutic exercise;Balance training   PT Goals (Current goals can be found in the Care Plan section) Acute Rehab PT Goals Patient Stated Goal: be able to use my leg (referring to splint on left foot) PT Goal Formulation: With patient Time For Goal Achievement: 08/24/14 Potential to Achieve Goals: Fair    Frequency Min 2X/week   Barriers to discharge        Co-evaluation  End of Session   Activity Tolerance: Patient limited by fatigue Patient left: in bed;with call bell/phone within reach;with bed alarm set           Time: 9147-8295 PT Time Calculation (min) (ACUTE ONLY): 18 min   Charges:   PT Evaluation $Initial PT Evaluation Tier I: 1 Procedure     PT G CodesShanna Cisco 08/10/2014, 12:01 PM  08/10/2014 [name], [place]

## 2014-08-10 NOTE — Progress Notes (Signed)
PATIENT DETAILS Name: [name] Age: 80 y.o. Sex: female Date of Birth: 1935/01/19 Admit Date: 08/09/2014 Admitting Physician Ivor Costa, MD [id number],[email or web address], MD  Subjective: Awake and alert this afternoon. Denies any major complaints. Feels better than on admission  Assessment/Plan: Principal Problem:   Hypoglycemia: Probably secondary to worsening renal function. Suspect we need to decrease or even stop long-acting insulin. CBGs stable, continue SSI for now. Check A1c  Active Problems:   Acute encephalopathy: Resolved. Secondary to hypoglycemia and UTI.    UTI: Continue aztreonam, afebrile, leukocytosis decreasing. Appears nontoxic. Follow blood and urine cultures.    Acute on chronic renal failure stage III: Renal function improving with supportive care. Continue to monitor and follow electrolytes    Type 2 diabetes: Lantus remains on hold because of hypoglycemia, SSI and follow CBGs. Await A1c    Hypothyroidism: Continue Synthroid    Dyslipidemia: Continue Crestor    Hypertension: Controlled, continue Coreg, amlodipine and clonidine     Chronic diastolic heart failure: Clinically compensated. EF around 55-60% by echo on 01/27/12. Lasix on hold because of worsening renal function. Continue Coreg, resume Lasix when able.    CAD (coronary artery disease): Without shortness of breath or chest pain. Continue aspirin, Coreg, and statin.     History of COPD: Lungs are clear. Continue with as needed bronchodilators     Gastroesophageal reflux disease: Continue PPI     Left ankle fracture: Secondary to a recent fall on 3/24. She was seen in the emergency room on 3/24, placed on a short leg splint, and asked to follow-up with orthopedics-Dr. Lorin Mercy.  Disposition: Remain inpatient  Antibiotics:  See below   Anti-infectives    Start     Dose/Rate Route Frequency Ordered Stop   08/10/14 0015  aztreonam (AZACTAM) 500 mg in dextrose 5 % 50 mL IVPB     500 mg 100  mL/hr over 30 Minutes Intravenous Every 12 hours 08/10/14 0014     08/09/14 2245  cefTRIAXone (ROCEPHIN) 1 g in dextrose 5 % 50 mL IVPB     1 g 100 mL/hr over 30 Minutes Intravenous  Once 08/09/14 2238 08/09/14 2342      DVT Prophylaxis: Prophylactic Heparin   Code Status: Full code   Family Communication None at bedside  Procedures:  None  CONSULTS:  None  Time spent 40 minutes-which includes 50% of the time with face-to-face with patient/ family and coordinating care related to the above assessment and plan.  MEDICATIONS: Scheduled Meds: . amitriptyline  50 mg Oral QHS  . amLODipine  10 mg Oral Daily  . antiseptic oral rinse  7 mL Mouth Rinse BID  . aspirin  81 mg Oral Daily  . aztreonam  500 mg Intravenous Q12H  . B-complex with vitamin C  1 tablet Oral Daily  . carvedilol  25 mg Oral BID WC  . cloNIDine  0.2 mg Oral BID  . docusate sodium  100 mg Oral Q12H  . escitalopram  20 mg Oral Daily  . gabapentin  300 mg Oral TID  . heparin  5,000 Units Subcutaneous 3 times per day  . hydrocerin   Topical BID  . insulin aspart  0-9 Units Subcutaneous TID WC  . lactose free nutrition  237 mL Oral BID BM  . levothyroxine  50 mcg Oral QAC breakfast  . loratadine  10 mg Oral Daily  . multivitamin  1 tablet Oral Daily  . pantoprazole  40 mg  Oral Daily  . polyvinyl alcohol  1 drop Both Eyes TID  . rosuvastatin  20 mg Oral QPM  . senna-docusate  2 tablet Oral QHS  . sodium chloride  3 mL Intravenous Q12H  . zinc oxide   Topical TID   Continuous Infusions:  PRN Meds:.acetaminophen, albuterol, alum & mag hydroxide-simeth, dextrose, guaiFENesin, HYDROcodone-acetaminophen, loperamide, polyethylene glycol    PHYSICAL EXAM: Vital signs in last 24 hours: Filed Vitals:   08/10/14 0019 08/10/14 0025 08/10/14 0457 08/10/14 0835  BP: 156/39  155/38 116/96  Pulse: 59     Temp: 97.8 F (36.6 C)  98 F (36.7 C) 98.2 F (36.8 C)  TempSrc: Oral  Oral Oral  Resp: 15  13     Height:  5\' 3"  (1.6 m)    Weight:  88.2 kg (194 lb 7.1 oz)    SpO2: 99%  98% 95%    Weight change:  Filed Weights   08/09/14 1836 08/10/14 0025  Weight: 88.905 kg (196 lb) 88.2 kg (194 lb 7.1 oz)   Body mass index is 34.45 kg/(m^2).   Gen Exam: Awake and alert with clear speech.   Neck: Supple, No JVD.   Chest: B/L Clear.   CVS: S1 S2 Regular, no murmurs.  Abdomen: soft, BS +, non tender, non distended.  Extremities: no edema, lower extremities warm to touch. Left leg in splint-did not open. Left toes were warm, with Good capillary refill Neurologic: Non Focal.   Skin: No Rash.   Wounds: N/A.    Intake/Output from previous day:  Intake/Output Summary (Last 24 hours) at 08/10/14 1415 Last data filed at 08/10/14 0930  Gross per 24 hour  Intake      0 ml  Output   1540 ml  Net  -1540 ml     LAB RESULTS: CBC  Recent Labs Lab 08/09/14 1908 08/10/14 0608  WBC 14.8* 13.3*  HGB 10.8* 10.7*  HCT 35.9* 34.5*  PLT 225 228  MCV 93.2 92.2  MCH 28.1 28.6  MCHC 30.1 31.0  RDW 14.1 14.0  LYMPHSABS 2.0  --   MONOABS 1.1*  --   EOSABS 0.1  --   BASOSABS 0.0  --     Chemistries   Recent Labs Lab 08/09/14 1908 08/10/14 0608  NA 141 142  K 4.2 4.9  CL 103 100  CO2 30 29  GLUCOSE 86 135*  BUN 50* 50*  CREATININE 2.50* 2.24*  CALCIUM 9.1 9.5    CBG:  Recent Labs Lab 08/09/14 2107 08/09/14 2155 08/09/14 2335 08/10/14 0751 08/10/14 1136  GLUCAP 153* 148* 179* 137* 104*    GFR Estimated Creatinine Clearance: 22.1 mL/min (by C-G formula based on Cr of 2.24).  Coagulation profile  Recent Labs Lab 08/10/14 0100  INR 1.13    Cardiac Enzymes  Recent Labs Lab 08/10/14 0100 08/10/14 0608 08/10/14 1152  TROPONINI <0.03 0.03 <0.03    Invalid input(s): POCBNP No results for input(s): DDIMER in the last 72 hours. No results for input(s): HGBA1C in the last 72 hours. No results for input(s): CHOL, HDL, LDLCALC, TRIG, CHOLHDL, LDLDIRECT in the last  72 hours. No results for input(s): TSH, T4TOTAL, T3FREE, THYROIDAB in the last 72 hours.  Invalid input(s): FREET3 No results for input(s): VITAMINB12, FOLATE, FERRITIN, TIBC, IRON, RETICCTPCT in the last 72 hours. No results for input(s): LIPASE, AMYLASE in the last 72 hours.  Urine Studies No results for input(s): UHGB, CRYS in the last 72 hours.  Invalid input(s): UACOL,  UAPR, USPG, UPH, UTP, UGL, UKET, UBIL, UNIT, UROB, ULEU, UEPI, UWBC, URBC, UBAC, CAST, UCOM, BILUA  MICROBIOLOGY: Recent Results (from the past 240 hour(s))  MRSA PCR Screening     Status: None   Collection Time: 08/10/14 12:11 AM  Result Value Ref Range Status   MRSA by PCR NEGATIVE NEGATIVE Final    Comment:        The GeneXpert MRSA Assay (FDA approved for NASAL specimens only), is one component of a comprehensive MRSA colonization surveillance program. It is not intended to diagnose MRSA infection nor to guide or monitor treatment for MRSA infections.     RADIOLOGY STUDIES/RESULTS: Dg Tibia/fibula Left  08/07/2014   CLINICAL DATA:  Left leg pain after fall. Fall getting out of wheelchair with twisting injury one evening prior. Pain in lateral ankle and foot.  EXAM: LEFT TIBIA AND FIBULA - 2 VIEW  COMPARISON:  Concurrently performed ankle radiographs.  FINDINGS: Proximal tibia and fibula are intact. No fracture. The distal tibia and fibula were imaged on concurrently performed ankle radiographs there is mild soft tissue edema, vascular calcifications, and scattered phleboliths.  IMPRESSION: No fracture of the proximal tibia/fibula.   Electronically Signed   By: Jeb Levering M.D.   On: 08/07/2014 06:17   Dg Ankle Complete Left  08/07/2014   CLINICAL DATA:  Left ankle pain after fall. Fall getting out of wheelchair with twisting injury one evening prior. Pain in lateral ankle and foot.  EXAM: LEFT ANKLE COMPLETE - 3+ VIEW  COMPARISON:  None.  FINDINGS: There is a nondisplaced fracture of the distal fibula,  distal to the ankle mortise. Questionable nondisplaced fracture of the distal medial malleolus. The ankle mortise is preserved. There is mild diffuse soft tissue edema. Vascular calcifications are noted. Soft tissue calcifications consistent with phleboliths the lower leg.  IMPRESSION: 1. Nondisplaced fracture of the distal fibula distal to the ankle mortise. 2. Question nondisplaced distal medial malleolar fracture.   Electronically Signed   By: Jeb Levering M.D.   On: 08/07/2014 06:14   US Renal  08/10/2014   CLINICAL DATA:  Acute renal insufficiency, UTI a  EXAM: RENAL/URINARY TRACT ULTRASOUND COMPLETE  COMPARISON:  CT scan 10/12/2013  FINDINGS: Right Kidney:  Length: 9.3 cm. Echogenicity within normal limits. No mass or hydronephrosis visualized. Mild cortical thinning probable due to atrophy  Left Kidney:  Length: 9.1 cm. Echogenicity within normal limits. No mass or hydronephrosis visualized. Mild cortical thinning probable due to atrophy  Bladder:  Foley catheter in decompressed urinary bladder.  IMPRESSION: 1. No hydronephrosis. No renal calculi. Mild bilateral cortical thinning probable due to renal atrophy. Foley catheter in decompressed urinary bladder.   Electronically Signed   By: Lahoma Crocker M.D.   On: 08/10/2014 10:35   Dg Chest Port 1 View  08/09/2014   CLINICAL DATA:  Abnormal lungs sounds. Found unresponsive. Shortness of breath, wheezing.  EXAM: PORTABLE CHEST - 1 VIEW  COMPARISON:  11/22/2013  FINDINGS: Heart is upper limits normal in size. No confluent airspace opacities or effusions. No acute bony abnormality.  IMPRESSION: No active disease.   Electronically Signed   By: Rolm Baptise M.D.   On: 08/09/2014 20:47   Dg Foot Complete Left  08/07/2014   CLINICAL DATA:  Left foot and ankle pain after fall. Fall getting out of wheelchair with twisting injury one evening prior. Pain in lateral ankle and foot.  EXAM: LEFT FOOT - COMPLETE 3+ VIEW  COMPARISON:  Left foot series 03/16/14  FINDINGS: The bones are under mineralized. There are hammertoe deformity of the digits. The nondisplaced distal fibular fracture is better characterized on concurrently performed ankle series. No additional fracture of the foot. Mild soft tissue edema is seen.  IMPRESSION: Bony under mineralization, no acute left foot fracture.   Electronically Signed   By: Jeb Levering M.D.   On: 08/07/2014 06:16    Oren Binet, MD  [facility] Pager:[phone]  If 7PM-7AM, please contact night-coverage [email or web address] Password [email or web address] 08/10/2014, 2:15 PM   LOS: 1 day

## 2014-08-10 NOTE — Progress Notes (Signed)
80yo female presents to ED via EMS who were called for unresponsiveness, found to be hypoglycemic w/ SOB and wheezing/rales, UA abnormal, to begin IV ABX for UTI.  Will start aztreonam 500mg  IV Q12H for CrCl ~20 ml/min and monitor CBC and Cx.  Wynona Neat, PharmD, BCPS 08/10/2014 12:13 AM

## 2014-08-11 LAB — URINE CULTURE

## 2014-08-11 LAB — GLUCOSE, CAPILLARY
GLUCOSE-CAPILLARY: 323 mg/dL — AB (ref 70–99)
Glucose-Capillary: 219 mg/dL — ABNORMAL HIGH (ref 70–99)
Glucose-Capillary: 266 mg/dL — ABNORMAL HIGH (ref 70–99)
Glucose-Capillary: 352 mg/dL — ABNORMAL HIGH (ref 70–99)

## 2014-08-11 LAB — BASIC METABOLIC PANEL
Anion gap: 12 (ref 5–15)
BUN: 53 mg/dL — ABNORMAL HIGH (ref 6–23)
CO2: 29 mmol/L (ref 19–32)
CREATININE: 2.41 mg/dL — AB (ref 0.50–1.10)
Calcium: 9.3 mg/dL (ref 8.4–10.5)
Chloride: 102 mmol/L (ref 96–112)
GFR calc Af Amer: 21 mL/min — ABNORMAL LOW (ref >90)
GFR, EST NON AFRICAN AMERICAN: 18 mL/min — AB (ref >90)
GLUCOSE: 296 mg/dL — AB (ref 70–99)
Potassium: 4.4 mmol/L (ref 3.5–5.1)
SODIUM: 143 mmol/L (ref 135–145)

## 2014-08-11 LAB — UREA NITROGEN, URINE: Urea Nitrogen, Ur: 265 mg/dL

## 2014-08-11 MED ORDER — DEXTROSE 5 % IV SOLN
500.0000 mg | Freq: Three times a day (TID) | INTRAVENOUS | Status: DC
  Administered 2014-08-11 – 2014-08-13 (×7): 500 mg via INTRAVENOUS
  Filled 2014-08-11 (×11): qty 0.5

## 2014-08-11 MED ORDER — SODIUM CHLORIDE 0.9 % IV SOLN
INTRAVENOUS | Status: AC
  Administered 2014-08-11: 75 mL/h via INTRAVENOUS
  Administered 2014-08-12: 08:00:00 via INTRAVENOUS

## 2014-08-11 MED ORDER — INSULIN GLARGINE 100 UNIT/ML ~~LOC~~ SOLN
15.0000 [IU] | Freq: Every day | SUBCUTANEOUS | Status: DC
  Administered 2014-08-11 – 2014-08-12 (×2): 15 [IU] via SUBCUTANEOUS
  Filled 2014-08-11 (×2): qty 0.15

## 2014-08-11 NOTE — Progress Notes (Signed)
Orthopedic Tech Progress Note Patient Details:  [name] Nov 17, 1934 [id number]  Ortho Devices Type of Ortho Device: CAM walker Ortho Device/Splint Location: lle Ortho Device/Splint Interventions: Application   Anala Whisenant 08/11/2014, 11:04 AM

## 2014-08-11 NOTE — Progress Notes (Signed)
PATIENT DETAILS Name: [name] Age: 80 y.o. Sex: female Date of Birth: [birth year] Admit Date: 08/09/2014 Admitting Physician Ivor Costa, MD [id number], MD  Subjective: Awake and alert this afternoon. Denies any major complaints.   Assessment/Plan: Principal Problem:   Hypoglycemia: Probably secondary to worsening renal function. Lantus held on admission. CBGs stable-infact now increasing, continue SSI for now. Await A1c  Active Problems:   Acute encephalopathy: Resolved. Secondary to hypoglycemia and UTI.    UTI: Continue aztreonam, afebrile, leukocytosis decreasing. Appears nontoxic.Urine culture shows insignificant growth, will continue IV Azactam till 3/29 to complete a 3 day course and then discontinue. Blood cultures negative so far.     Acute on chronic renal failure stage III: Renal function seems to have slightly worsened today, will start IVF and recheck in am.      Type 2 diabetes: Lantus was held on admission because of hypoglycemia. CBG's have now increased, will restart Lantus-but at 15 units, continue SSI.Follow CBGs. Await A1c    Hypothyroidism: Continue Synthroid    Dyslipidemia: Continue Crestor    Hypertension: Controlled, continue Coreg, amlodipine and clonidine     Chronic diastolic heart failure: Clinically compensated. EF around 55-60% by echo on 01/27/12. Lasix on hold because of worsening renal function. Continue Coreg, resume Lasix when able.    CAD (coronary artery disease): Without shortness of breath or chest pain. Continue aspirin, Coreg, and statin.     History of COPD: Lungs are clear. Continue with as needed bronchodilators     Gastroesophageal reflux disease: Continue PPI     Left ankle fracture: Secondary to a recent fall on 3/24. She was seen in the emergency room on 3/24, placed on a short leg splint, and asked to follow-up with orthopedics-Dr. Lorin Mercy.This MD spoke with Dr Erlinda Hong on 3/28-who reviewed XRay and recommended  walking boot and WBAT. To follow with Dr Erlinda Hong on discharge at [facility].  Disposition: Remain inpatient-SNF in 1-2 days  Antibiotics:  See below   Anti-infectives    Start     Dose/Rate Route Frequency Ordered Stop   08/11/14 0815  aztreonam (AZACTAM) 500 mg in dextrose 5 % 50 mL IVPB     500 mg 100 mL/hr over 30 Minutes Intravenous 3 times per day 08/11/14 0811     08/10/14 0015  aztreonam (AZACTAM) 500 mg in dextrose 5 % 50 mL IVPB  Status:  Discontinued     500 mg 100 mL/hr over 30 Minutes Intravenous Every 12 hours 08/10/14 0014 08/11/14 0811   08/09/14 2245  cefTRIAXone (ROCEPHIN) 1 g in dextrose 5 % 50 mL IVPB     1 g 100 mL/hr over 30 Minutes Intravenous  Once 08/09/14 2238 08/09/14 2342      DVT Prophylaxis: Prophylactic Heparin   Code Status: Full code   Family Communication Brother at bedside  Procedures:  None  CONSULTS:  None   MEDICATIONS: Scheduled Meds: . amitriptyline  50 mg Oral QHS  . amLODipine  10 mg Oral Daily  . antiseptic oral rinse  7 mL Mouth Rinse BID  . aspirin  81 mg Oral Daily  . aztreonam  500 mg Intravenous 3 times per day  . B-complex with vitamin C  1 tablet Oral Daily  . carvedilol  25 mg Oral BID WC  . cloNIDine  0.2 mg Oral BID  . docusate sodium  100 mg Oral Q12H  . escitalopram  20 mg Oral Daily  . gabapentin  300 mg Oral TID  . heparin  5,000 Units Subcutaneous 3 times per day  . hydrocerin   Topical BID  . insulin aspart  0-9 Units Subcutaneous TID WC  . lactose free nutrition  237 mL Oral BID BM  . levothyroxine  50 mcg Oral QAC breakfast  . loratadine  10 mg Oral Daily  . multivitamin  1 tablet Oral Daily  . pantoprazole  40 mg Oral Daily  . polyvinyl alcohol  1 drop Both Eyes TID  . rosuvastatin  20 mg Oral QPM  . senna-docusate  2 tablet Oral QHS  . sodium chloride  3 mL Intravenous Q12H  . zinc oxide   Topical TID   Continuous Infusions: . sodium chloride     PRN Meds:.acetaminophen,  albuterol, alum & mag hydroxide-simeth, dextrose, guaiFENesin, HYDROcodone-acetaminophen, loperamide, polyethylene glycol    PHYSICAL EXAM: Vital signs in last 24 hours: Filed Vitals:   08/11/14 0603 08/11/14 0607 08/11/14 1041 08/11/14 1320  BP: 142/39 158/49 153/61 151/35  Pulse: 71  68   Temp: 98.9 F (37.2 C)   98.6 F (37 C)  TempSrc: Oral   Oral  Resp: 18   16  Height:      Weight:      SpO2: 96%   90%    Weight change: -0.705 kg (-1 lb 8.9 oz) Filed Weights   08/09/14 1836 08/10/14 0025 08/11/14 0500  Weight: 88.905 kg (196 lb) 88.2 kg (194 lb 7.1 oz) 88.2 kg (194 lb 7.1 oz)   Body mass index is 34.45 kg/(m^2).   Gen Exam: Awake and alert with clear speech.   Neck: Supple, No JVD.   Chest: B/L Clear.   CVS: S1 S2 Regular, no murmurs.  Abdomen: soft, BS +, non tender, non distended.  Extremities: no edema, lower extremities warm to touch. Left leg in splint-did not open. Left toes were warm, with Good capillary refill Neurologic: Non Focal.   Skin: No Rash.   Wounds: N/A.    Intake/Output from previous day:  Intake/Output Summary (Last 24 hours) at 08/11/14 1424 Last data filed at 08/10/14 1853  Gross per 24 hour  Intake    100 ml  Output    500 ml  Net   -400 ml     LAB RESULTS: CBC  Recent Labs Lab 08/09/14 1908 08/10/14 0608  WBC 14.8* 13.3*  HGB 10.8* 10.7*  HCT 35.9* 34.5*  PLT 225 228  MCV 93.2 92.2  MCH 28.1 28.6  MCHC 30.1 31.0  RDW 14.1 14.0  LYMPHSABS 2.0  --   MONOABS 1.1*  --   EOSABS 0.1  --   BASOSABS 0.0  --     Chemistries   Recent Labs Lab 08/09/14 1908 08/10/14 0608 08/11/14 1000  NA 141 142 143  K 4.2 4.9 4.4  CL 103 100 102  CO2 30 29 29   GLUCOSE 86 135* 296*  BUN 50* 50* 53*  CREATININE 2.50* 2.24* 2.41*  CALCIUM 9.1 9.5 9.3    CBG:  Recent Labs Lab 08/10/14 1136 08/10/14 1642 08/10/14 2215 08/11/14 0803 08/11/14 1202  GLUCAP 104* 264* 284* 219* 352*    GFR Estimated Creatinine Clearance:  20.6 mL/min (by C-G formula based on Cr of 2.41).  Coagulation profile  Recent Labs Lab 08/10/14 0100  INR 1.13    Cardiac Enzymes  Recent Labs Lab 08/10/14 0100 08/10/14 0608 08/10/14 1152  TROPONINI <0.03 0.03 <0.03    Invalid input(s): POCBNP No results for input(s): DDIMER  in the last 72 hours. No results for input(s): HGBA1C in the last 72 hours. No results for input(s): CHOL, HDL, LDLCALC, TRIG, CHOLHDL, LDLDIRECT in the last 72 hours. No results for input(s): TSH, T4TOTAL, T3FREE, THYROIDAB in the last 72 hours.  Invalid input(s): FREET3 No results for input(s): VITAMINB12, FOLATE, FERRITIN, TIBC, IRON, RETICCTPCT in the last 72 hours. No results for input(s): LIPASE, AMYLASE in the last 72 hours.  Urine Studies No results for input(s): UHGB, CRYS in the last 72 hours.  Invalid input(s): UACOL, UAPR, USPG, UPH, UTP, UGL, UKET, UBIL, UNIT, UROB, ULEU, UEPI, UWBC, URBC, UBAC, CAST, UCOM, BILUA  MICROBIOLOGY: Recent Results (from the past 240 hour(s))  Urine culture     Status: None   Collection Time: 08/09/14  8:30 PM  Result Value Ref Range Status   Specimen Description URINE, RANDOM  Final   Special Requests NONE  Final   Colony Count   Final    2,000 COLONIES/ML Performed at [facility]    Culture   Final    INSIGNIFICANT GROWTH Performed at [facility]    Report Status 08/11/2014 FINAL  Final  Culture, blood (routine x 2)     Status: None (Preliminary result)   Collection Time: 08/09/14 11:04 PM  Result Value Ref Range Status   Specimen Description BLOOD FOREARM RIGHT  Final   Special Requests BOTTLES DRAWN AEROBIC AND ANAEROBIC 5ML  Final   Culture   Final           BLOOD CULTURE RECEIVED NO GROWTH TO DATE CULTURE WILL BE HELD FOR 5 DAYS BEFORE ISSUING A FINAL NEGATIVE REPORT Performed at [facility]    Report Status PENDING  Incomplete  Culture, blood (routine x 2)     Status: None (Preliminary result)   Collection  Time: 08/09/14 11:05 PM  Result Value Ref Range Status   Specimen Description BLOOD LEFT ANTECUBITAL  Final   Special Requests BOTTLES DRAWN AEROBIC AND ANAEROBIC 10CC  Final   Culture   Final           BLOOD CULTURE RECEIVED NO GROWTH TO DATE CULTURE WILL BE HELD FOR 5 DAYS BEFORE ISSUING A FINAL NEGATIVE REPORT Performed at [facility]    Report Status PENDING  Incomplete  MRSA PCR Screening     Status: None   Collection Time: 08/10/14 12:11 AM  Result Value Ref Range Status   MRSA by PCR NEGATIVE NEGATIVE Final    Comment:        The GeneXpert MRSA Assay (FDA approved for NASAL specimens only), is one component of a comprehensive MRSA colonization surveillance program. It is not intended to diagnose MRSA infection nor to guide or monitor treatment for MRSA infections.     RADIOLOGY STUDIES/RESULTS: Dg Tibia/fibula Left  08/07/2014   CLINICAL DATA:  Left leg pain after fall. Fall getting out of wheelchair with twisting injury one evening prior. Pain in lateral ankle and foot.  EXAM: LEFT TIBIA AND FIBULA - 2 VIEW  COMPARISON:  Concurrently performed ankle radiographs.  FINDINGS: Proximal tibia and fibula are intact. No fracture. The distal tibia and fibula were imaged on concurrently performed ankle radiographs there is mild soft tissue edema, vascular calcifications, and scattered phleboliths.  IMPRESSION: No fracture of the proximal tibia/fibula.   Electronically Signed   By: Jeb Levering M.D.   On: 08/07/2014 06:17   Dg Ankle Complete Left  08/07/2014   CLINICAL DATA:  Left ankle pain after  fall. Fall getting out of wheelchair with twisting injury one evening prior. Pain in lateral ankle and foot.  EXAM: LEFT ANKLE COMPLETE - 3+ VIEW  COMPARISON:  None.  FINDINGS: There is a nondisplaced fracture of the distal fibula, distal to the ankle mortise. Questionable nondisplaced fracture of the distal medial malleolus. The ankle mortise is preserved. There is mild diffuse  soft tissue edema. Vascular calcifications are noted. Soft tissue calcifications consistent with phleboliths the lower leg.  IMPRESSION: 1. Nondisplaced fracture of the distal fibula distal to the ankle mortise. 2. Question nondisplaced distal medial malleolar fracture.   Electronically Signed   By: Jeb Levering M.D.   On: 08/07/2014 06:14   US Renal  08/10/2014   CLINICAL DATA:  Acute renal insufficiency, UTI a  EXAM: RENAL/URINARY TRACT ULTRASOUND COMPLETE  COMPARISON:  CT scan 10/12/2013  FINDINGS: Right Kidney:  Length: 9.3 cm. Echogenicity within normal limits. No mass or hydronephrosis visualized. Mild cortical thinning probable due to atrophy  Left Kidney:  Length: 9.1 cm. Echogenicity within normal limits. No mass or hydronephrosis visualized. Mild cortical thinning probable due to atrophy  Bladder:  Foley catheter in decompressed urinary bladder.  IMPRESSION: 1. No hydronephrosis. No renal calculi. Mild bilateral cortical thinning probable due to renal atrophy. Foley catheter in decompressed urinary bladder.   Electronically Signed   By: Lahoma Crocker M.D.   On: 08/10/2014 10:35   Dg Chest Port 1 View  08/09/2014   CLINICAL DATA:  Abnormal lungs sounds. Found unresponsive. Shortness of breath, wheezing.  EXAM: PORTABLE CHEST - 1 VIEW  COMPARISON:  11/22/2013  FINDINGS: Heart is upper limits normal in size. No confluent airspace opacities or effusions. No acute bony abnormality.  IMPRESSION: No active disease.   Electronically Signed   By: Rolm Baptise M.D.   On: 08/09/2014 20:47   Dg Foot Complete Left  08/07/2014   CLINICAL DATA:  Left foot and ankle pain after fall. Fall getting out of wheelchair with twisting injury one evening prior. Pain in lateral ankle and foot.  EXAM: LEFT FOOT - COMPLETE 3+ VIEW  COMPARISON:  Left foot series 03/16/14  FINDINGS: The bones are under mineralized. There are hammertoe deformity of the digits. The nondisplaced distal fibular fracture is better characterized on  concurrently performed ankle series. No additional fracture of the foot. Mild soft tissue edema is seen.  IMPRESSION: Bony under mineralization, no acute left foot fracture.   Electronically Signed   By: Jeb Levering M.D.   On: 08/07/2014 06:16    Oren Binet, MD  [facility] Pager:[phone]  If 7PM-7AM, please contact night-coverage [email or web address] Password [email or web address] 08/11/2014, 2:24 PM   LOS: 2 days

## 2014-08-11 NOTE — Progress Notes (Signed)
Inpatient Diabetes Program Recommendations  AACE/ADA: New Consensus Statement on Inpatient Glycemic Control (2013)  Target Ranges:  Prepandial:   less than 140 mg/dL      Peak postprandial:   less than 180 mg/dL (1-2 hours)      Critically ill patients:  140 - 180 mg/dL   Results for BRIGGITTE, BOLINE (MRN [id number]) as of 08/11/2014 12:16  Ref. Range 08/10/2014 07:51 08/10/2014 11:36 08/10/2014 16:42 08/10/2014 22:15 08/11/2014 08:03 08/11/2014 12:02  Glucose-Capillary Latest Range: 70-99 mg/dL 137 (H) 104 (H) 264 (H) 284 (H) 219 (H) 352 (H)   Results for ANISHKA, BUSHARD (MRN [id number]) as of 08/11/2014 12:16  Ref. Range 10/08/2013 01:05  Hemoglobin A1C Latest Range: <5.7 % 9.9 (H)   Diabetes history: DM2 Outpatient Diabetes medications: Lantus 60 units QHS, Humalog 0-22 units TID with meals Current orders for Inpatient glycemic control: Novolog 0-9 units TID with meals  Inpatient Diabetes Program Recommendations Insulin - Basal: Note patient was initially admitted with hypoglycemia which has resolved and CBG now up to 352 mg/dl. Please consider ordering low dose basal insulin; recommend ordering Lantus 17 units Q24H (based on 88 kg x 0.2 units). Correction (SSI): Please consider ordering Novolog bedtime correction sclae. HgbA1C: Please consider ordering an A1C to evaluate glycemic control over to the past 2-3 months.  Thanks, Barnie Alderman, RN, MSN, CCRN, CDE Diabetes Coordinator Inpatient Diabetes Program [phone] (Team Pager from [place] to [place]) [phone] ([facility] office) [phone] [facility] office)

## 2014-08-11 NOTE — Progress Notes (Signed)
Physical Therapy Treatment Patient Details Name: [name] MRN: [id number] DOB: 01-15-1935 Today's Date: 08/11/2014    History of Present Illness Patient admitted with hypoglycemia; PMH includes COPD, TIA, CHF and recent ankle sprain/fx? (xray 08/09/14 negative for fx)    PT Comments    Pt was seen for mobility at bedside as a cam boot application is planned but not done yet for L ankle.  Her tolerance for sitting is reduced but is working with PT to recover control.  Planning to go to SNF today and will work on her transfers and gait there.  Follow Up Recommendations  SNF     Equipment Recommendations  None recommended by PT    Recommendations for Other Services       Precautions / Restrictions Precautions Precautions: Fall Precaution Comments: Cast on left ankle Required Braces or Orthoses: Other Brace/Splint Other Brace/Splint: Cam boot application today Restrictions Weight Bearing Restrictions: Yes LLE Weight Bearing: Non weight bearing    Mobility  Bed Mobility Overal bed mobility: Needs Assistance Bed Mobility: Supine to Sit;Sit to Supine     Supine to sit: Mod assist Sit to supine: Mod assist   General bed mobility comments: Pt is starting to initiate more to get into supine after session  Transfers                 General transfer comment: not attempted  Ambulation/Gait             General Gait Details: not attempted   Stairs            Wheelchair Mobility    Modified Rankin (Stroke Patients Only)       Balance Overall balance assessment: Needs assistance Sitting-balance support: Feet supported;Bilateral upper extremity supported Sitting balance-Leahy Scale: Poor Sitting balance - Comments: Leaning to L and back with PT continually prompting correction Postural control: Posterior lean;Left lateral lean   Standing balance-Leahy Scale: Zero                      Cognition Arousal/Alertness:  Awake/alert Behavior During Therapy: WFL for tasks assessed/performed Overall Cognitive Status: Impaired/Different from baseline Area of Impairment: Safety/judgement;Following commands;Problem solving Orientation Level: Place;Time;Situation     Following Commands: Follows one step commands inconsistently Safety/Judgement: Decreased awareness of safety   Problem Solving: Slow processing;Decreased initiation;Difficulty sequencing;Requires verbal cues;Requires tactile cues      Exercises General Exercises - Lower Extremity Ankle Circles/Pumps: AROM;Right;10 reps Long Arc Quad: AROM;Strengthening;Both;10 reps Heel Slides: AROM;Strengthening;Both;10 reps Hip ABduction/ADduction: AROM;Both;10 reps    General Comments General comments (skin integrity, edema, etc.): Pt is up bedside and working on LE strengthening and is plannign to go to SNF today      Pertinent Vitals/Pain Pain Assessment: Faces Pain Score: 2  Faces Pain Scale: Hurts a little bit Pain Location: left ankle Pain Intervention(s): Limited activity within patient's tolerance;Monitored during session;Repositioned;Premedicated before session    Home Living Family/patient expects to be discharged to:: Skilled nursing facility               Additional Comments: [facility]    Prior Function Level of Independence: Needs assistance  Gait / Transfers Assistance Needed: pt reports she is w/c bound ADL's / Homemaking Assistance Needed: Pt stated the could do her bathing but needed assistance with dressing.  unsure of reliability of information as no family present.     PT Goals (current goals can now be found in the care plan section) Acute Rehab  PT Goals Patient Stated Goal: Ready to get boot Progress towards PT goals: Progressing toward goals    Frequency  Min 2X/week    PT Plan Current plan remains appropriate    Co-evaluation             End of Session   Activity Tolerance: Patient limited by  fatigue Patient left: in bed;with call bell/phone within reach;with bed alarm set     Time: 1130-1200 PT Time Calculation (min) (ACUTE ONLY): 30 min  Charges:  $Therapeutic Exercise: 8-22 mins $Therapeutic Activity: 8-22 mins                    G Codes:      Ramond Dial August 15, 2014, 1:01 PM   Mee Hives, PT MS Acute Rehab Dept. Number: [phone]

## 2014-08-11 NOTE — Clinical Social Work Psychosocial (Signed)
Clinical Social Work Department BRIEF PSYCHOSOCIAL ASSESSMENT 08/11/2014  Patient:  [name], [name]     Account Number:  [id number]     Admit date:  08/09/2014  Clinical Social Worker:  Lovey Newcomer  Date/Time:  08/11/2014 11:45 AM  Referred by:  Physician  Date Referred:  08/11/2014 Referred for  SNF Placement   Other Referral:   NA   Interview type:  Patient Other interview type:   Patient and brother interviewed at bedside to complete assessment.    PSYCHOSOCIAL DATA Living Status:  FACILITY Admitted from facility:  Other Level of care:  Assisted Living Primary support name:  [name] and [name] Primary support relationship to patient:  CHILD, ADULT Degree of support available:   Support is strong.    CURRENT CONCERNS Current Concerns  Post-Acute Placement   Other Concerns:   NA    SOCIAL WORK ASSESSMENT / PLAN CSW met with patient and patient's brother at bedside to complete assessment. Patient presents with flat affect and is limited in engagement with CSW. Patient's brother helps provide some information. Per patient and brother, the patient was admitted from [facility]. Patient and family are planning for a discharge to [facility] once medically stable. CSW explained SNF search/placement process and answered patient's questions. Per brother, patient's son [name] is HPOA. CSW contact John to confirm current plan. John confirms that plan is for SNF at discharge and preference for [facility]. [name] asks that the daughter [name] be able to assist with making the decisions and completing any paper work as he is currently in [place].    Patient was admitted from [place], but family does not feel the ALF can manage her rehab needs at this time. Patient's family appears to have good insight into the reason for the patient's admission and her post discharge needs. Patient's family appears very supportive and very concerned about the patient's  wellbeing. CSW will follow up with available bed offers once available.   Assessment/plan status:  Psychosocial Support/Ongoing Assessment of Needs Other assessment/ plan:   Complete Fl2, Fax, PASRR   Information/referral to community resources:   CSW contact information and SNF list given.    PATIENT'S/FAMILY'S RESPONSE TO PLAN OF CARE: Patient and patient's family plans for DC to [facility] (preferred) once medically stable for discharge. CSW will assist as appropriate.     Liz Beach MSW, [place], [place], [phone]    Bryant Delrick Dehart MSW, [place], [place], [phone]

## 2014-08-11 NOTE — Evaluation (Signed)
Occupational Therapy Evaluation Patient Details Name: [name] MRN: [id number] DOB: March 03, 1935 Today's Date: 08/11/2014    History of Present Illness Patient admitted with hypoglycemia; PMH includes COPD, TIA, CHF and recent ankle sprain/fx? (xray 08/09/14 negative for fx)   Clinical Impression   Pt currently with confusion, not oriented to time or reason for hospitalization.  Currently needs total assist +2 for functional transfers, sit to stand, and LB selfcare tasks. Feel pt will need acute care OT based on current limitations as well as follow-up 24 hour assist and SNF level therapy.  No weightbearing status noted in the chart at this time.  MD please update if pt is NWBing.    Follow Up Recommendations  SNF;Supervision/Assistance - 24 hour    Equipment Recommendations  Other (comment) (To be recommended next venue of care)       Precautions / Restrictions Precautions Precautions: Fall Precaution Comments: Cast on left ankle Required Braces or Orthoses: Other Brace/Splint Restrictions Weight Bearing Restrictions:  (Unsure no WB limitations noted in chart orders)      Mobility Bed Mobility                  Transfers                      Balance   Sitting-balance support: No upper extremity supported Sitting balance-Leahy Scale: Zero Sitting balance - Comments: patient unable to maintain midline orientation sitting EOC.  Continual LOB to the left side when attempting to sit unsupported     Standing balance-Leahy Scale: Zero                              ADL   Eating/Feeding: Supervision/ safety;Sitting   Grooming: Wash/dry hands;Wash/dry face;Sitting;Set up   Upper Body Bathing: Supervision/ safety;Sitting   Lower Body Bathing: +2 for physical assistance;Moderate assistance;Sit to/from stand   Upper Body Dressing : Moderate assistance;Sitting   Lower Body Dressing: Total assistance;+2 for physical assistance;Sit to/from  stand   Toilet Transfer: +2 for physical assistance;Total assistance;Stand-pivot;BSC   Toileting- Clothing Manipulation and Hygiene: +2 for physical assistance;Moderate assistance;Sit to/from stand         General ADL Comments: Pt with unclear weightbearing status over the LLE.  She has a ankle splint/cast on but x-rays on 3/24 were negative for fracture.  Attempted standing but unable to keep weight off of the LLE and could only maintain 2-3 seconds.      Vision Vision Assessment?: Vision impaired- to be further tested in functional context   Perception Perception Perception Tested?: No   Praxis Praxis Praxis tested?: Not tested    Pertinent Vitals/Pain Pain Assessment: Faces Faces Pain Scale: Hurts a little bit Pain Location: left ankle Pain Intervention(s): Limited activity within patient's tolerance;Repositioned     Hand Dominance Right   Extremity/Trunk Assessment Upper Extremity Assessment Upper Extremity Assessment: Generalized weakness (3+/5 in bilateral UEs )   Lower Extremity Assessment Lower Extremity Assessment: Defer to PT evaluation   Cervical / Trunk Assessment Cervical / Trunk Assessment: Kyphotic   Communication Communication Communication: No difficulties   Cognition Arousal/Alertness: Awake/alert Behavior During Therapy: WFL for tasks assessed/performed Overall Cognitive Status: Impaired/Different from baseline (Pt stated she was in the hospital because she brought her daughter over here) Area of Impairment: Memory;Orientation;Safety/judgement;Awareness Orientation Level: Place;Time;Situation       Safety/Judgement: Decreased awareness of safety  Home Living Family/patient expects to be discharged to:: Skilled nursing facility                                 Additional Comments: [facility]      Prior Functioning/Environment Level of Independence: Needs assistance  Gait / Transfers Assistance  Needed: pt reports she is w/c bound ADL's / Homemaking Assistance Needed: Pt stated the could do her bathing but needed assistance with dressing.  unsure of reliability of information as no family present.        OT Diagnosis: Generalized weakness;Cognitive deficits;Disturbance of vision;Altered mental status;Acute pain   OT Problem List: Decreased strength;Impaired balance (sitting and/or standing);Pain;Decreased cognition;Decreased knowledge of use of DME or AE   OT Treatment/Interventions: Self-care/ADL training;Patient/family education;Balance training;Therapeutic activities;DME and/or AE instruction;Neuromuscular education;Therapeutic exercise;Cognitive remediation/compensation    OT Goals(Current goals can be found in the care plan section) Acute Rehab OT Goals Patient Stated Goal: Pt wants to get the cast/splint off of her left foot. OT Goal Formulation: With patient Time For Goal Achievement: 08/25/14 Potential to Achieve Goals: Fair  OT Frequency: Min 2X/week   Barriers to D/C:               End of Session Nurse Communication: Mobility status (Need for setup help for feeding secondary to vision)  Activity Tolerance: Patient tolerated treatment well Patient left: in chair   Time: 0822-0852 OT Time Calculation (min): 30 min Charges:  OT General Charges $OT Visit: 1 Procedure OT Evaluation $Initial OT Evaluation Tier I: 1 Procedure OT Treatments $Self Care/Home Management : 23-37 mins  Zaynah Chawla OTR/L 08/11/2014, 9:15 AM

## 2014-08-11 NOTE — Clinical Social Work Placement (Signed)
Clinical Social Work Department CLINICAL SOCIAL WORK PLACEMENT NOTE 08/11/2014  Patient:  TAIJAH, MACRAE  Account Number:  [id number] [place] date:  08/09/2014  Clinical Social Worker:  Kemper Durie, [place]  Date/time:  08/11/2014 12:05 PM  Clinical Social Work is seeking post-discharge placement for this patient at the following level of care:   [place]   (*CSW will update this form in Epic as items are completed)   08/11/2014  Patient/family provided with [place] Department of Clinical Social Work's list of facilities offering this level of care within the geographic area requested by the patient (or if unable, by the patient's family).  08/11/2014  Patient/family informed of their freedom to choose among providers that offer the needed level of care, that participate in Medicare, Medicaid or managed care program needed by the patient, have an available bed and are willing to accept the patient.  08/11/2014  Patient/family informed of [facility]' ownership interest in [facility], as well as of the fact that they are under no obligation to receive care at this facility.  PASARR submitted to EDS on 08/11/2014 PASARR number received on 08/11/2014  FL2 transmitted to all facilities in geographic area requested by pt/family on  08/11/2014 FL2 transmitted to all facilities within larger geographic area on 08/11/2014  Patient informed that his/her managed care company has contracts with or will negotiate with  certain facilities, including the following:     Patient/family informed of bed offers received:   Patient chooses bed at  Physician recommends and patient chooses bed at    Patient to be transferred to  on   Patient to be transferred to facility by  Patient and family notified of transfer on  Name of family member notified:    The following physician request were entered in Epic:   Additional Comments:    Liz Beach MSW, [place], [place],  [phone]

## 2014-08-12 LAB — GLUCOSE, CAPILLARY
GLUCOSE-CAPILLARY: 225 mg/dL — AB (ref 70–99)
Glucose-Capillary: 277 mg/dL — ABNORMAL HIGH (ref 70–99)
Glucose-Capillary: 238 mg/dL — ABNORMAL HIGH (ref 70–99)
Glucose-Capillary: 222 mg/dL — ABNORMAL HIGH (ref 70–99)

## 2014-08-12 LAB — HEMOGLOBIN A1C
HEMOGLOBIN A1C: 7.1 % — AB (ref 4.8–5.6)
Mean Plasma Glucose: 157 mg/dL

## 2014-08-12 MED ORDER — INSULIN GLARGINE 100 UNIT/ML ~~LOC~~ SOLN
10.0000 [IU] | Freq: Once | SUBCUTANEOUS | Status: AC
  Administered 2014-08-12: 10 [IU] via SUBCUTANEOUS
  Filled 2014-08-12: qty 0.1

## 2014-08-12 MED ORDER — INSULIN GLARGINE 100 UNIT/ML ~~LOC~~ SOLN
22.0000 [IU] | Freq: Every day | SUBCUTANEOUS | Status: DC
  Administered 2014-08-13: 22 [IU] via SUBCUTANEOUS
  Filled 2014-08-12: qty 0.22

## 2014-08-12 NOTE — Progress Notes (Addendum)
PATIENT DETAILS Name: [name] Age: 80 y.o. Sex: female Date of Birth: [birth year] Admit Date: 08/09/2014 Admitting Physician Ivor Costa, MD HYI:FOYD,XAJOINOMV, MD  Subjective: Elevated CBG this am . Per RN difficulty swallowing   Assessment/Plan:     Hypoglycemia resolved now hyperglycemic : Probably secondary to worsening renal function. Lantus held on admission. CBGs stable-infact now increasing, continue SSI for now. A1c 7.1. Restarted lantus 3/28 , increase dose today to 22 U ,and give and extra 10 units     Acute encephalopathy: Resolved. Secondary to hypoglycemia and UTI.    UTI: Continue aztreonam, afebrile, leukocytosis decreasing. Appears nontoxic.Urine culture shows insignificant growth, will continue IV Azactam till 3/29 to complete a 3 day course and then discontinue. Blood cultures negative so far.     Acute on chronic renal failure stage III: Renal function seems to have slightly worsened today, cont IVF and recheck in am.      Type 2 diabetes: Lantus was held on admission because of hypoglycemia. CBG's have now increased, now on lantus 22 units , (dose increased)    Hypothyroidism: Continue Synthroid    Dyslipidemia: Continue Crestor    Hypertension: Controlled, continue Coreg, amlodipine and clonidine     Chronic diastolic heart failure: Clinically compensated. EF around 55-60% by echo on 01/27/12. Lasix on hold because of worsening renal function. Continue Coreg, resume Lasix when able.    CAD (coronary artery disease): Without shortness of breath or chest pain. Continue aspirin, Coreg, and statin.     History of COPD: Lungs are clear. Continue with as needed bronchodilators     Gastroesophageal reflux disease: Continue PPI, SLP eval     Left ankle fracture: Secondary to a recent fall on 3/24. She was seen in the emergency room on 3/24, placed on a short leg splint, and asked to follow-up with orthopedics-Dr. Lorin Mercy.This MD spoke with Dr Erlinda Hong on  3/28-who reviewed XRay and recommended walking boot and WBAT. To follow with Dr Erlinda Hong on discharge at [facility].  Disposition: Remain inpatient-SNF 3/30  Antibiotics:  See below   Anti-infectives    Start     Dose/Rate Route Frequency Ordered Stop   08/11/14 0815  aztreonam (AZACTAM) 500 mg in dextrose 5 % 50 mL IVPB     500 mg 100 mL/hr over 30 Minutes Intravenous 3 times per day 08/11/14 0811     08/10/14 0015  aztreonam (AZACTAM) 500 mg in dextrose 5 % 50 mL IVPB  Status:  Discontinued     500 mg 100 mL/hr over 30 Minutes Intravenous Every 12 hours 08/10/14 0014 08/11/14 0811   08/09/14 2245  cefTRIAXone (ROCEPHIN) 1 g in dextrose 5 % 50 mL IVPB     1 g 100 mL/hr over 30 Minutes Intravenous  Once 08/09/14 2238 08/09/14 2342      DVT Prophylaxis: Prophylactic Heparin   Code Status: Full code   Family Communication Brother at bedside  Procedures:  None  CONSULTS:  None   MEDICATIONS: Scheduled Meds: . amitriptyline  50 mg Oral QHS  . amLODipine  10 mg Oral Daily  . antiseptic oral rinse  7 mL Mouth Rinse BID  . aspirin  81 mg Oral Daily  . aztreonam  500 mg Intravenous 3 times per day  . B-complex with vitamin C  1 tablet Oral Daily  . carvedilol  25 mg Oral BID WC  . cloNIDine  0.2 mg Oral BID  . docusate sodium  100  mg Oral Q12H  . escitalopram  20 mg Oral Daily  . gabapentin  300 mg Oral TID  . heparin  5,000 Units Subcutaneous 3 times per day  . hydrocerin   Topical BID  . insulin aspart  0-9 Units Subcutaneous TID WC  . insulin glargine  10 Units Subcutaneous Once  . [START ON 08/13/2014] insulin glargine  22 Units Subcutaneous Daily  . lactose free nutrition  237 mL Oral BID BM  . levothyroxine  50 mcg Oral QAC breakfast  . loratadine  10 mg Oral Daily  . multivitamin  1 tablet Oral Daily  . pantoprazole  40 mg Oral Daily  . polyvinyl alcohol  1 drop Both Eyes TID  . rosuvastatin  20 mg Oral QPM  . senna-docusate  2 tablet Oral QHS  .  sodium chloride  3 mL Intravenous Q12H  . zinc oxide   Topical TID   Continuous Infusions: . sodium chloride 75 mL/hr at 08/12/14 0823   PRN Meds:.acetaminophen, albuterol, alum & mag hydroxide-simeth, dextrose, guaiFENesin, HYDROcodone-acetaminophen, loperamide, polyethylene glycol    PHYSICAL EXAM: Vital signs in last 24 hours: Filed Vitals:   08/11/14 1320 08/11/14 2111 08/12/14 0555 08/12/14 0822  BP: 151/35 154/39 151/41 154/45  Pulse:  61 65 73  Temp: 98.6 F (37 C) 98.3 F (36.8 C) 98.2 F (36.8 C)   TempSrc: Oral Oral Oral   Resp: 16 18 18    Height:      Weight:   88.2 kg (194 lb 7.1 oz)   SpO2: 90% 91% 94% 100%    Weight change: 0 kg (0 lb) Filed Weights   08/10/14 0025 08/11/14 0500 08/12/14 0555  Weight: 88.2 kg (194 lb 7.1 oz) 88.2 kg (194 lb 7.1 oz) 88.2 kg (194 lb 7.1 oz)   Body mass index is 34.45 kg/(m^2).   Gen Exam: Awake and alert with clear speech.   Neck: Supple, No JVD.   Chest: B/L Clear.   CVS: S1 S2 Regular, no murmurs.  Abdomen: soft, BS +, non tender, non distended.  Extremities: no edema, lower extremities warm to touch. Left leg in splint-did not open. Left toes were warm, with Good capillary refill Neurologic: Non Focal.   Skin: No Rash.   Wounds: N/A.    Intake/Output from previous day:  Intake/Output Summary (Last 24 hours) at 08/12/14 1230 Last data filed at 08/12/14 0956  Gross per 24 hour  Intake 1226.25 ml  Output      0 ml  Net 1226.25 ml     LAB RESULTS: CBC  Recent Labs Lab 08/09/14 1908 08/10/14 0608  WBC 14.8* 13.3*  HGB 10.8* 10.7*  HCT 35.9* 34.5*  PLT 225 228  MCV 93.2 92.2  MCH 28.1 28.6  MCHC 30.1 31.0  RDW 14.1 14.0  LYMPHSABS 2.0  --   MONOABS 1.1*  --   EOSABS 0.1  --   BASOSABS 0.0  --     Chemistries   Recent Labs Lab 08/09/14 1908 08/10/14 0608 08/11/14 1000  NA 141 142 143  K 4.2 4.9 4.4  CL 103 100 102  CO2 30 29 29   GLUCOSE 86 135* 296*  BUN 50* 50* 53*  CREATININE 2.50*  2.24* 2.41*  CALCIUM 9.1 9.5 9.3    CBG:  Recent Labs Lab 08/11/14 1202 08/11/14 1658 08/11/14 2330 08/12/14 0757 08/12/14 1157  GLUCAP 352* 323* 266* 277* 225*    GFR Estimated Creatinine Clearance: 20.6 mL/min (by C-G formula based on Cr of  2.41).  Coagulation profile  Recent Labs Lab 08/10/14 0100  INR 1.13    Cardiac Enzymes  Recent Labs Lab 08/10/14 0100 08/10/14 0608 08/10/14 1152  TROPONINI <0.03 0.03 <0.03    Invalid input(s): POCBNP No results for input(s): DDIMER in the last 72 hours.  Recent Labs  08/10/14 0608  HGBA1C 7.1*   No results for input(s): CHOL, HDL, LDLCALC, TRIG, CHOLHDL, LDLDIRECT in the last 72 hours. No results for input(s): TSH, T4TOTAL, T3FREE, THYROIDAB in the last 72 hours.  Invalid input(s): FREET3 No results for input(s): VITAMINB12, FOLATE, FERRITIN, TIBC, IRON, RETICCTPCT in the last 72 hours. No results for input(s): LIPASE, AMYLASE in the last 72 hours.  Urine Studies No results for input(s): UHGB, CRYS in the last 72 hours.  Invalid input(s): UACOL, UAPR, USPG, UPH, UTP, UGL, UKET, UBIL, UNIT, UROB, ULEU, UEPI, UWBC, URBC, UBAC, CAST, UCOM, BILUA  MICROBIOLOGY: Recent Results (from the past 240 hour(s))  Urine culture     Status: None   Collection Time: 08/09/14  8:30 PM  Result Value Ref Range Status   Specimen Description URINE, RANDOM  Final   Special Requests NONE  Final   Colony Count   Final    2,000 COLONIES/ML Performed at [facility]    Culture   Final    INSIGNIFICANT GROWTH Performed at [facility]    Report Status 08/11/2014 FINAL  Final  Culture, blood (routine x 2)     Status: None (Preliminary result)   Collection Time: 08/09/14 11:04 PM  Result Value Ref Range Status   Specimen Description BLOOD FOREARM RIGHT  Final   Special Requests BOTTLES DRAWN AEROBIC AND ANAEROBIC 5ML  Final   Culture   Final           BLOOD CULTURE RECEIVED NO GROWTH TO DATE CULTURE WILL BE  HELD FOR 5 DAYS BEFORE ISSUING A FINAL NEGATIVE REPORT Performed at [facility]    Report Status PENDING  Incomplete  Culture, blood (routine x 2)     Status: None (Preliminary result)   Collection Time: 08/09/14 11:05 PM  Result Value Ref Range Status   Specimen Description BLOOD LEFT ANTECUBITAL  Final   Special Requests BOTTLES DRAWN AEROBIC AND ANAEROBIC 10CC  Final   Culture   Final           BLOOD CULTURE RECEIVED NO GROWTH TO DATE CULTURE WILL BE HELD FOR 5 DAYS BEFORE ISSUING A FINAL NEGATIVE REPORT Performed at [facility]    Report Status PENDING  Incomplete  MRSA PCR Screening     Status: None   Collection Time: 08/10/14 12:11 AM  Result Value Ref Range Status   MRSA by PCR NEGATIVE NEGATIVE Final    Comment:        The GeneXpert MRSA Assay (FDA approved for NASAL specimens only), is one component of a comprehensive MRSA colonization surveillance program. It is not intended to diagnose MRSA infection nor to guide or monitor treatment for MRSA infections.     RADIOLOGY STUDIES/RESULTS: Dg Tibia/fibula Left  08/07/2014   CLINICAL DATA:  Left leg pain after fall. Fall getting out of wheelchair with twisting injury one evening prior. Pain in lateral ankle and foot.  EXAM: LEFT TIBIA AND FIBULA - 2 VIEW  COMPARISON:  Concurrently performed ankle radiographs.  FINDINGS: Proximal tibia and fibula are intact. No fracture. The distal tibia and fibula were imaged on concurrently performed ankle radiographs there is mild soft tissue edema, vascular  calcifications, and scattered phleboliths.  IMPRESSION: No fracture of the proximal tibia/fibula.   Electronically Signed   By: Jeb Levering M.D.   On: 08/07/2014 06:17   Dg Ankle Complete Left  08/07/2014   CLINICAL DATA:  Left ankle pain after fall. Fall getting out of wheelchair with twisting injury one evening prior. Pain in lateral ankle and foot.  EXAM: LEFT ANKLE COMPLETE - 3+ VIEW  COMPARISON:  None.   FINDINGS: There is a nondisplaced fracture of the distal fibula, distal to the ankle mortise. Questionable nondisplaced fracture of the distal medial malleolus. The ankle mortise is preserved. There is mild diffuse soft tissue edema. Vascular calcifications are noted. Soft tissue calcifications consistent with phleboliths the lower leg.  IMPRESSION: 1. Nondisplaced fracture of the distal fibula distal to the ankle mortise. 2. Question nondisplaced distal medial malleolar fracture.   Electronically Signed   By: Jeb Levering M.D.   On: 08/07/2014 06:14   US Renal  08/10/2014   CLINICAL DATA:  Acute renal insufficiency, UTI a  EXAM: RENAL/URINARY TRACT ULTRASOUND COMPLETE  COMPARISON:  CT scan 10/12/2013  FINDINGS: Right Kidney:  Length: 9.3 cm. Echogenicity within normal limits. No mass or hydronephrosis visualized. Mild cortical thinning probable due to atrophy  Left Kidney:  Length: 9.1 cm. Echogenicity within normal limits. No mass or hydronephrosis visualized. Mild cortical thinning probable due to atrophy  Bladder:  Foley catheter in decompressed urinary bladder.  IMPRESSION: 1. No hydronephrosis. No renal calculi. Mild bilateral cortical thinning probable due to renal atrophy. Foley catheter in decompressed urinary bladder.   Electronically Signed   By: Lahoma Crocker M.D.   On: 08/10/2014 10:35   Dg Chest Port 1 View  08/09/2014   CLINICAL DATA:  Abnormal lungs sounds. Found unresponsive. Shortness of breath, wheezing.  EXAM: PORTABLE CHEST - 1 VIEW  COMPARISON:  11/22/2013  FINDINGS: Heart is upper limits normal in size. No confluent airspace opacities or effusions. No acute bony abnormality.  IMPRESSION: No active disease.   Electronically Signed   By: Rolm Baptise M.D.   On: 08/09/2014 20:47   Dg Foot Complete Left  08/07/2014   CLINICAL DATA:  Left foot and ankle pain after fall. Fall getting out of wheelchair with twisting injury one evening prior. Pain in lateral ankle and foot.  EXAM: LEFT FOOT  - COMPLETE 3+ VIEW  COMPARISON:  Left foot series 03/16/14  FINDINGS: The bones are under mineralized. There are hammertoe deformity of the digits. The nondisplaced distal fibular fracture is better characterized on concurrently performed ankle series. No additional fracture of the foot. Mild soft tissue edema is seen.  IMPRESSION: Bony under mineralization, no acute left foot fracture.   Electronically Signed   By: Jeb Levering M.D.   On: 08/07/2014 06:16    Reyne Dumas, MD  [facility] Pager:[phone]  If 7PM-7AM, please contact night-coverage [email or web address] Password [email or web address] 08/12/2014, 12:30 PM   LOS: 3 days

## 2014-08-12 NOTE — Clinical Social Work Note (Signed)
Authorization received by [employer] Patient can DC to SNF once MD determines patient is ready.   Liz Beach MSW, [place], [place], [phone]

## 2014-08-12 NOTE — Clinical Social Work Note (Cosign Needed)
Patient's son asked social worker to contact patient's daughter to discuss discharge plans due to him living in [place]. BSW Wilcox tried contacting patient's daughter Butch Penny but was unable. The number on chart is currently disconnected and the son gave CSW wrong number.   [name], [name], [phone]

## 2014-08-12 NOTE — Evaluation (Signed)
Clinical/Bedside Swallow Evaluation Patient Details  Name: [name] MRN: [id number] Date of Birth: 18-Jan-1935  Today's Date: 08/12/2014 Time: SLP Start Time (ACUTE ONLY): 1310 SLP Stop Time (ACUTE ONLY): 1327 SLP Time Calculation (min) (ACUTE ONLY): 17 min  Past Medical History:  Past Medical History  Diagnosis Date  . Hypertension   . Macular degeneration, bilateral   . Type II diabetes mellitus   . COPD (chronic obstructive pulmonary disease)   . Diabetic peripheral neuropathy   . Hypercholesteremia   . PAD (peripheral artery disease)     a. 1997 s/p R fem-pop bypass;  b. 03/1999 R CIA stenting.  . CKD (chronic kidney disease), stage III   . Headache(784.0)   . Hypothyroid   . Non-obstructive CAD     a. 11/2005 Cath: LM nl, LAD 30 diffuse, LCX small, 20p, 61m/d, 75d into OM, RCA dominant, 40p, 30-40 diffuse, EF 55%.  Marland Kitchen TIA (transient ischemic attack)     a. 09/2013.  . H/O echocardiogram     a. 09/2013 Echo: nl LV fxn, no rwma, mildly to mod dil LA, mildly dil RA.  . Carotid arterial disease     a. 09/2013 Carotid U/S: 40-59% bilat ICA stenosis.  . Enterococcus UTI     a. 09/2013.  . Tobacco abuse   . Cholelithiasis   . Pyelonephritis    Past Surgical History:  Past Surgical History  Procedure Laterality Date  . Tonsillectomy  1940's  . Abdominal hysterectomy  1970's  . Dilation and curettage of uterus  1970's    "probably 2" (11/01/2012)  . Cataract extraction w/ intraocular lens  implant, bilateral Bilateral ~ 2010  . Bypass graft Right ~ 1997    RLE by Dr. Gwenlyn Perking 11/29/2005 (11/01/2012)  . Cystostomy w/ bladder biopsy  2005    Archie Endo 09/19/2003 (11/01/2012)  . Anterior cervical decomp/discectomy fusion  01/02/2006    Archie Endo 01/02/2006 (11/01/2012)  . Shoulder open rotator cuff repair Bilateral 1990's-2000's    "3X on the left; twice on the right" (11/01/2012)  . Cardiac catheterization  11/29/2005    Archie Endo 11/29/2005 (11/01/2012)  . Esophagogastroduodenoscopy N/A  10/14/2013    Procedure: ESOPHAGOGASTRODUODENOSCOPY (EGD);  Surgeon: Missy Sabins, MD;  Location: [facility];  Service: Endoscopy;  Laterality: N/A;   HPI:  Pt is a 80 y.o. female with PMH of hypertension, hyperlipidemia, GERD, hypothyroidism, diabetes mellitus, COPD, peripheral vascular disease, chronic kidney disease-stage III, history of TIA, carotid artery stenosis, history of tobacco abuse, congestive heart failure, who presents with altered mental status, hypokalemia, UTI, acute encephalopathy. Pt resides in SNF. CXR negative. RN reported difficulty swallowing this a.m.- bedside swallow eval ordered.   Assessment / Plan / Recommendation Clinical Impression  Pt demonstrated one instance of throat clear following large straw sip of thin liquid; no other overt s/s of aspiration. Pt c/o globus sensation with foods such as biscuits and things that are crunchy- described occasionally needing to cough up the food. No difficulties reported with meds. Pt had regular barium swallow done 04/2011 which revealed mod-severe tertiary contractions with diminished primary peristaltic wave, along with a small hiatal hernia. Pt's swallowing difficulties are likely esophageal in nature. Recommend downgrading to dysphagia 3 diet, continue thin liquids, meds whole with liquid, reflux precautions- sit upright 30-60 minutes after meal, alternate foods with liquids, small bites/ sips. Provide intermittent supervision to cue for these strategies. Educated pt on recommendations and compensatory strategies- pt in agreement with plan. SLP will f/u at least x1 for diet tolerance.  Aspiration Risk  Mild    Diet Recommendation Dysphagia 3 (Mechanical Soft);Thin liquid   Liquid Administration via: Cup;Straw Medication Administration: Whole meds with liquid Supervision: Patient able to self feed;Intermittent supervision to cue for compensatory strategies Compensations: Slow rate;Small sips/bites;Follow solids with  liquid Postural Changes and/or Swallow Maneuvers: Seated upright 90 degrees;Upright 30-60 min after meal    Other  Recommendations Oral Care Recommendations: Oral care BID Other Recommendations: Clarify dietary restrictions   Follow Up Recommendations  Skilled Nursing facility    Frequency and Duration min 1 x/week  1 week   Pertinent Vitals/Pain n/a    SLP Swallow Goals     Swallow Study Prior Functional Status       General HPI: Pt is a 81 y.o. female with PMH of hypertension, hyperlipidemia, GERD, hypothyroidism, diabetes mellitus, COPD, peripheral vascular disease, chronic kidney disease-stage III, history of TIA, carotid artery stenosis, history of tobacco abuse, congestive heart failure, who presents with altered mental status, hypokalemia, UTI, acute encephalopathy. Pt resides in SNF. CXR negative. RN reported difficulty swallowing this a.m.- bedside swallow eval ordered. Type of Study: Bedside swallow evaluation Diet Prior to this Study: Regular;Thin liquids Temperature Spikes Noted: No Respiratory Status: Room air History of Recent Intubation: No Behavior/Cognition: Alert;Cooperative Oral Cavity - Dentition: Dentures, not available;Edentulous Self-Feeding Abilities: Able to feed self Patient Positioning: Upright in bed Baseline Vocal Quality: Low vocal intensity Volitional Cough: Strong Volitional Swallow: Able to elicit    Oral/Motor/Sensory Function Overall Oral Motor/Sensory Function: Appears within functional limits for tasks assessed Labial ROM: Within Functional Limits;Other (Comment) (tongue tie- mildly limited tongue protrusion) Labial Symmetry: Within Functional Limits Labial Strength: Within Functional Limits Labial Sensation: Within Functional Limits Lingual ROM: Within Functional Limits Lingual Symmetry: Within Functional Limits Lingual Strength: Within Functional Limits Lingual Sensation: Within Functional Limits   Ice Chips Ice chips: Not tested    Thin Liquid Thin Liquid: Impaired Presentation: Cup;Straw Pharyngeal  Phase Impairments: Throat Clearing - Immediate    Nectar Thick Nectar Thick Liquid: Not tested   Honey Thick Honey Thick Liquid: Not tested   Puree Puree: Within functional limits Presentation: Self Fed;Spoon   Solid   GO    Solid: Impaired Presentation: Self Fed Oral Phase Impairments: Impaired mastication       O[place]ak, Amy K, MA, CCC-SLP 08/12/2014,1:35 PM

## 2014-08-13 ENCOUNTER — Other Ambulatory Visit: Payer: Self-pay

## 2014-08-13 LAB — COMPREHENSIVE METABOLIC PANEL
ALK PHOS: 58 U/L (ref 39–117)
ALT: 12 U/L (ref 0–35)
AST: 17 U/L (ref 0–37)
Albumin: 2.5 g/dL — ABNORMAL LOW (ref 3.5–5.2)
Anion gap: 12 (ref 5–15)
BILIRUBIN TOTAL: 0.2 mg/dL — AB (ref 0.3–1.2)
BUN: 39 mg/dL — AB (ref 6–23)
CALCIUM: 9.3 mg/dL (ref 8.4–10.5)
CO2: 25 mmol/L (ref 19–32)
CREATININE: 1.79 mg/dL — AB (ref 0.50–1.10)
Chloride: 107 mmol/L (ref 96–112)
GFR calc non Af Amer: 26 mL/min — ABNORMAL LOW (ref >90)
GFR, EST AFRICAN AMERICAN: 30 mL/min — AB (ref >90)
GLUCOSE: 226 mg/dL — AB (ref 70–99)
Potassium: 4.4 mmol/L (ref 3.5–5.1)
Sodium: 144 mmol/L (ref 135–145)
Total Protein: 6.3 g/dL (ref 6.0–8.3)

## 2014-08-13 LAB — CBC
HCT: 33.1 % — ABNORMAL LOW (ref 36.0–46.0)
Hemoglobin: 10.2 g/dL — ABNORMAL LOW (ref 12.0–15.0)
MCH: 28.6 pg (ref 26.0–34.0)
MCHC: 30.8 g/dL (ref 30.0–36.0)
MCV: 92.7 fL (ref 78.0–100.0)
Platelets: 269 10*3/uL (ref 150–400)
RBC: 3.57 MIL/uL — ABNORMAL LOW (ref 3.87–5.11)
RDW: 13.6 % (ref 11.5–15.5)
WBC: 8.7 10*3/uL (ref 4.0–10.5)

## 2014-08-13 LAB — GLUCOSE, CAPILLARY
Glucose-Capillary: 185 mg/dL — ABNORMAL HIGH (ref 70–99)
Glucose-Capillary: 245 mg/dL — ABNORMAL HIGH (ref 70–99)

## 2014-08-13 MED ORDER — INSULIN GLARGINE 100 UNIT/ML ~~LOC~~ SOLN: 40.0000 [IU] | Freq: Every day | SUBCUTANEOUS | Status: DC

## 2014-08-13 MED ORDER — HYDROCODONE-ACETAMINOPHEN 5-325 MG PO TABS: 1.0000 | ORAL_TABLET | Freq: Four times a day (QID) | ORAL | Status: DC | PRN

## 2014-08-13 MED ORDER — GLUCERNA SHAKE PO LIQD
237.0000 mL | Freq: Two times a day (BID) | ORAL | Status: DC
  Administered 2014-08-13: 237 mL via ORAL

## 2014-08-13 MED ORDER — GABAPENTIN 300 MG PO CAPS
300.0000 mg | ORAL_CAPSULE | Freq: Two times a day (BID) | ORAL | Status: DC
  Administered 2014-08-13: 300 mg via ORAL
  Filled 2014-08-13 (×2): qty 1

## 2014-08-13 MED ORDER — ALPRAZOLAM 0.25 MG PO TABS: 0.2500 mg | ORAL_TABLET | Freq: Two times a day (BID) | ORAL | Status: DC | PRN

## 2014-08-13 MED ORDER — HYDROCODONE-ACETAMINOPHEN 5-325 MG PO TABS: ORAL_TABLET | ORAL | Status: DC

## 2014-08-13 MED ORDER — PANTOPRAZOLE SODIUM 40 MG PO TBEC: 40.0000 mg | DELAYED_RELEASE_TABLET | Freq: Every day | ORAL | Status: AC

## 2014-08-13 MED ORDER — GABAPENTIN 300 MG PO CAPS: 300.0000 mg | ORAL_CAPSULE | Freq: Two times a day (BID) | ORAL | Status: DC

## 2014-08-13 MED ORDER — FUROSEMIDE 40 MG PO TABS: 40.0000 mg | ORAL_TABLET | Freq: Every day | ORAL | Status: DC

## 2014-08-13 MED ORDER — GLUCERNA SHAKE PO LIQD: 237.0000 mL | Freq: Two times a day (BID) | ORAL | Status: DC

## 2014-08-13 NOTE — Clinical Social Work Placement (Signed)
Clinical Social Work Department CLINICAL SOCIAL WORK PLACEMENT NOTE 08/11/2014  Patient: [name], [name] Account Number: [id number] [place] date: 08/09/2014  Clinical Social Worker: Kemper Durie, [place] Date/time: 08/11/2014 12:05 PM  Clinical Social Work is seeking post-discharge placement for this patient at the following level of care: [place] (*CSW will update this form in Epic as items are completed)   08/11/2014 Patient/family provided with [facility] Department of Clinical Social Work's list of facilities offering this level of care within the geographic area requested by the patient (or if unable, by the patient's family).  08/11/2014 Patient/family informed of their freedom to choose among providers that offer the needed level of care, that participate in Medicare, Medicaid or managed care program needed by the patient, have an available bed and are willing to accept the patient.  08/11/2014 Patient/family informed of [facility]' ownership interest in [facility], as well as of the fact that they are under no obligation to receive care at this facility.  PASARR submitted to EDS on 08/11/2014 PASARR number received on 08/11/2014  FL2 transmitted to all facilities in geographic area requested by pt/family on 08/11/2014 FL2 transmitted to all facilities within larger geographic area on 08/11/2014  Patient informed that his/her managed care company has contracts with or will negotiate with certain facilities, including the following:    Patient/family informed of bed offers received: 08/11/14 Patient chooses bed at [facility] Physician recommends and patient chooses bed at   Patient to be transferred to [facility] on 08/11/14 Patient to be transferred to facility by Ambulance  Patient and family notified of transfer on 08/11/14  Name of family member notified: Butch Penny The following physician request were entered in  Epic:   Additional Comments: Per MD patient ready for DC to [facility]. RN, patient, patient's family, and facility notified of DC. RN given number for report. DC packet on chart. [facility] transport requested for patient. CSW signing off.    Liz Beach MSW, [place], [place], [phone]

## 2014-08-13 NOTE — Progress Notes (Signed)
Physical Therapy Treatment Patient Details Name: [name] MRN: [id number] DOB: 1934-12-10 Today's Date: 08/13/2014    History of Present Illness Patient admitted with hypoglycemia; PMH includes COPD, TIA, CHF and recent ankle sprain/fx? (xray 08/09/14 negative for fx)    PT Comments    Pt's progress limited by difficulty maintaining NWB on L LE  Follow Up Recommendations  SNF     Equipment Recommendations  None recommended by PT    Recommendations for Other Services       Precautions / Restrictions Precautions Precautions: Fall Required Braces or Orthoses: Other Brace/Splint Other Brace/Splint: Camwalker boot Restrictions LLE Weight Bearing: Non weight bearing    Mobility  Bed Mobility Overal bed mobility: Needs Assistance Bed Mobility: Supine to Sit;Sit to Supine     Supine to sit: Min assist Sit to supine: Mod assist      Transfers Overall transfer level: Needs assistance   Transfers: Sit to/from Stand Sit to Stand: Mod assist;+2 physical assistance (3 trials)         General transfer comment: pt not doing well keeping weight off left foot  Ambulation/Gait             General Gait Details: not able   Stairs            Wheelchair Mobility    Modified Rankin (Stroke Patients Only)       Balance Overall balance assessment: Needs assistance Sitting-balance support: No upper extremity supported Sitting balance-Leahy Scale: Fair Sitting balance - Comments: maintained upright sitting without assist     Standing balance-Leahy Scale: Poor                      Cognition Arousal/Alertness: Awake/alert Behavior During Therapy: WFL for tasks assessed/performed Overall Cognitive Status: Impaired/Different from baseline Area of Impairment: Safety/judgement;Following commands;Problem solving       Following Commands: Follows one step commands inconsistently     Problem Solving: Slow processing      Exercises       General Comments        Pertinent Vitals/Pain Pain Assessment: Faces Faces Pain Scale: Hurts little more Pain Location: left ankle Pain Descriptors / Indicators: Aching Pain Intervention(s): Limited activity within patient's tolerance    Home Living                      Prior Function            PT Goals (current goals can now be found in the care plan section) Acute Rehab PT Goals Patient Stated Goal: Ready to get boot PT Goal Formulation: With patient Time For Goal Achievement: 08/24/14 Potential to Achieve Goals: Fair Progress towards PT goals: Progressing toward goals    Frequency  Min 2X/week    PT Plan Current plan remains appropriate    Co-evaluation             End of Session     Patient left: in bed;with call bell/phone within reach     Time: 1403-1420 PT Time Calculation (min) (ACUTE ONLY): 17 min  Charges:  $Therapeutic Activity: 8-22 mins                    G Codes:      Sohil Timko, Tessie Fass 08/13/2014, 3:48 PM 08/13/2014  Donnella Sham, PT [phone]  (pager)

## 2014-08-13 NOTE — Discharge Summary (Signed)
Physician Discharge Summary  [name] MRN: [id number] DOB/AGE: [birth year] 80 y.o.  PCP: Mayra Neer, MD   Admit date: 08/09/2014 Discharge date: 08/13/2014  Discharge Diagnoses:  *   Principal Problem:   Hypoglycemia Active Problems:   Hypothyroidism   Diabetes mellitus without complication   HYPERCHOLESTEROLEMIA   Essential hypertension   CAD (coronary artery disease)   COPD (chronic obstructive pulmonary disease)   Diastolic dysfunction, grade II   CKD (chronic kidney disease) stage 3, GFR 30-59 ml/min   Acute encephalopathy   UTI (lower urinary tract infection)   TIA (transient ischemic attack)   Left ankle strain  Follow-up recommendations Follow-up CBC, CMP in 1 week Follow-up with PCP in 5-7 days Dietary instructions Diet recommendations: Dysphagia 3 (mechanical soft);Thin liquid Liquids provided via: Straw;Cup Medication Administration: Whole meds with liquid Supervision: Patient able to self feed;Intermittent supervision to cue for compensatory strategies Compensations: Slow rate;Small sips/bites;Follow solids with liquid Postural Changes and/or Swallow Maneuvers: Seated upright 90 degrees;Upright 30-60 min after meal     Medication List    STOP taking these medications        cephALEXin 500 MG capsule  Commonly known as:  KEFLEX     ciprofloxacin 500 MG tablet  Commonly known as:  CIPRO     magnesium hydroxide 400 MG/5ML suspension  Commonly known as:  MILK OF MAGNESIA      TAKE these medications        acetaminophen 500 MG tablet  Commonly known as:  TYLENOL  Take 500 mg by mouth 3 (three) times daily. 8am 12pm, 4pm     albuterol (2.5 MG/3ML) 0.083% nebulizer solution  Commonly known as:  PROVENTIL  Take 2.5 mg by nebulization every 6 (six) hours as needed for wheezing.     ALPRAZolam 0.25 MG tablet  Commonly known as:  XANAX  Take 1 tablet (0.25 mg total) by mouth 2 (two) times daily as needed for anxiety.     alum & mag  hydroxide-simeth 200-200-20 MG/5ML suspension  Commonly known as:  MAALOX/MYLANTA  Take 30 mLs by mouth every 6 (six) hours as needed for indigestion or heartburn. Do not exceed 4 doses in 24 hours     amitriptyline 50 MG tablet  Commonly known as:  ELAVIL  Take 50 mg by mouth at bedtime.     amLODipine 10 MG tablet  Commonly known as:  NORVASC  Take 10 mg by mouth at bedtime.     aspirin 81 MG chewable tablet  Chew 81 mg by mouth daily.     B-complex with vitamin C tablet  Take 1 tablet by mouth daily.     bisacodyl 10 MG suppository  Commonly known as:  DULCOLAX  Place 10 mg rectally as needed for moderate constipation.     carvedilol 25 MG tablet  Commonly known as:  COREG  Take 25 mg by mouth 2 (two) times daily with a meal. 0800 and 1700     cloNIDine 0.2 MG tablet  Commonly known as:  CATAPRES  Take 0.2 mg by mouth 2 (two) times daily. Hold if blood pressure less than 100/60     Cranberry 500 MG Caps  Take 1,000 mg by mouth 2 (two) times daily.     docusate sodium 100 MG capsule  Commonly known as:  COLACE  Take 1 capsule (100 mg total) by mouth every 12 (twelve) hours.     escitalopram 20 MG tablet  Commonly known as:  LEXAPRO  Take  20 mg by mouth daily.     feeding supplement (GLUCERNA SHAKE) Liqd  Take 237 mLs by mouth 2 (two) times daily between meals.     furosemide 40 MG tablet  Commonly known as:  LASIX  Take 1 tablet (40 mg total) by mouth daily. 8am 2pm     gabapentin 300 MG capsule  Commonly known as:  NEURONTIN  Take 1 capsule (300 mg total) by mouth 2 (two) times daily.     guaiFENesin 100 MG/5ML liquid  Commonly known as:  ROBITUSSIN  Take 100 mg by mouth every 6 (six) hours as needed for cough. Not to exceed 4 doses in 24 hours     HYDROcodone-acetaminophen 5-325 MG per tablet  Commonly known as:  NORCO/VICODIN  Take 1-2 tablets by mouth every 6 (six) hours as needed.     insulin glargine 100 UNIT/ML injection  Commonly known as:   LANTUS  Inject 0.4 mLs (40 Units total) into the skin daily.     insulin lispro 100 UNIT/ML injection  Commonly known as:  HUMALOG  Inject 0-22 Units into the skin 3 (three) times daily with meals. CBG 0-90 0 units, 91-150 6 units, 151-200 10 units, 201-250 12 units, 251-300 14 units, 301-350 16 units, 351-400 18 units, 401-450 22 units, 451 or more 22 units and call md     levothyroxine 50 MCG tablet  Commonly known as:  SYNTHROID, LEVOTHROID  Take 50 mcg by mouth daily before breakfast.     loperamide 2 MG capsule  Commonly known as:  IMODIUM  Take 2 mg by mouth as needed for diarrhea or loose stools. Do not exceed 8 doses in 24 hours     loratadine 10 MG tablet  Commonly known as:  CLARITIN  Take 10 mg by mouth daily.     MINERIN Crea  Apply 1 application topically 2 (two) times daily. Apply to both lower extremities and feet twice daily     ondansetron 8 MG disintegrating tablet  Commonly known as:  ZOFRAN-ODT  Take 8 mg by mouth every 8 (eight) hours as needed for nausea or vomiting.     ondansetron 4 MG disintegrating tablet  Commonly known as:  ZOFRAN ODT  Take 1 tablet (4 mg total) by mouth every 8 (eight) hours as needed for nausea or vomiting.     pantoprazole 40 MG tablet  Commonly known as:  PROTONIX  Take 1 tablet (40 mg total) by mouth daily.     Polyethyl Glycol-Propyl Glycol 0.4-0.3 % Soln  Place 1 drop into both eyes 3 (three) times daily. Systane PF     polyethylene glycol packet  Commonly known as:  MIRALAX / GLYCOLAX  Take 17 g by mouth daily. Mix in 8 oz liquid and drink     PRESERVISION AREDS Caps  Take 1 capsule by mouth daily.     RISAMINE 0.44-20.625 % Oint  Generic drug:  Menthol-Zinc Oxide  Apply 1 application topically as needed (apply with each incontinence change).     rosuvastatin 20 MG tablet  Commonly known as:  CRESTOR  Take 20 mg by mouth every evening. 4pm     senna-docusate 8.6-50 MG per tablet  Commonly known as:  Senokot-S   Take 2 tablets by mouth 2 (two) times daily.     Vitamin D (Ergocalciferol) 50000 UNITS Caps capsule  Commonly known as:  DRISDOL  Take 50,000 Units by mouth every 7 (seven) days. On Saturdays  Discharge Condition: Stable   Disposition: SNF   Consults: None  Significant Diagnostic Studies: Dg Tibia/fibula Left  08/07/2014   CLINICAL DATA:  Left leg pain after fall. Fall getting out of wheelchair with twisting injury one evening prior. Pain in lateral ankle and foot.  EXAM: LEFT TIBIA AND FIBULA - 2 VIEW  COMPARISON:  Concurrently performed ankle radiographs.  FINDINGS: Proximal tibia and fibula are intact. No fracture. The distal tibia and fibula were imaged on concurrently performed ankle radiographs there is mild soft tissue edema, vascular calcifications, and scattered phleboliths.  IMPRESSION: No fracture of the proximal tibia/fibula.   Electronically Signed   By: Jeb Levering M.D.   On: 08/07/2014 06:17   Dg Ankle Complete Left  08/07/2014   CLINICAL DATA:  Left ankle pain after fall. Fall getting out of wheelchair with twisting injury one evening prior. Pain in lateral ankle and foot.  EXAM: LEFT ANKLE COMPLETE - 3+ VIEW  COMPARISON:  None.  FINDINGS: There is a nondisplaced fracture of the distal fibula, distal to the ankle mortise. Questionable nondisplaced fracture of the distal medial malleolus. The ankle mortise is preserved. There is mild diffuse soft tissue edema. Vascular calcifications are noted. Soft tissue calcifications consistent with phleboliths the lower leg.  IMPRESSION: 1. Nondisplaced fracture of the distal fibula distal to the ankle mortise. 2. Question nondisplaced distal medial malleolar fracture.   Electronically Signed   By: Jeb Levering M.D.   On: 08/07/2014 06:14   US Renal  08/10/2014   CLINICAL DATA:  Acute renal insufficiency, UTI a  EXAM: RENAL/URINARY TRACT ULTRASOUND COMPLETE  COMPARISON:  CT scan 10/12/2013  FINDINGS: Right Kidney:   Length: 9.3 cm. Echogenicity within normal limits. No mass or hydronephrosis visualized. Mild cortical thinning probable due to atrophy  Left Kidney:  Length: 9.1 cm. Echogenicity within normal limits. No mass or hydronephrosis visualized. Mild cortical thinning probable due to atrophy  Bladder:  Foley catheter in decompressed urinary bladder.  IMPRESSION: 1. No hydronephrosis. No renal calculi. Mild bilateral cortical thinning probable due to renal atrophy. Foley catheter in decompressed urinary bladder.   Electronically Signed   By: Lahoma Crocker M.D.   On: 08/10/2014 10:35   Dg Chest Port 1 View  08/09/2014   CLINICAL DATA:  Abnormal lungs sounds. Found unresponsive. Shortness of breath, wheezing.  EXAM: PORTABLE CHEST - 1 VIEW  COMPARISON:  11/22/2013  FINDINGS: Heart is upper limits normal in size. No confluent airspace opacities or effusions. No acute bony abnormality.  IMPRESSION: No active disease.   Electronically Signed   By: Rolm Baptise M.D.   On: 08/09/2014 20:47   Dg Foot Complete Left  08/07/2014   CLINICAL DATA:  Left foot and ankle pain after fall. Fall getting out of wheelchair with twisting injury one evening prior. Pain in lateral ankle and foot.  EXAM: LEFT FOOT - COMPLETE 3+ VIEW  COMPARISON:  Left foot series 03/16/14  FINDINGS: The bones are under mineralized. There are hammertoe deformity of the digits. The nondisplaced distal fibular fracture is better characterized on concurrently performed ankle series. No additional fracture of the foot. Mild soft tissue edema is seen.  IMPRESSION: Bony under mineralization, no acute left foot fracture.   Electronically Signed   By: Jeb Levering M.D.   On: 08/07/2014 06:16      Microbiology: Recent Results (from the past 240 hour(s))  Urine culture     Status: None   Collection Time: 08/09/14  8:30 PM  Result  Value Ref Range Status   Specimen Description URINE, RANDOM  Final   Special Requests NONE  Final   Colony Count   Final    2,000  COLONIES/ML Performed at [facility]    Culture   Final    INSIGNIFICANT GROWTH Performed at [facility]    Report Status 08/11/2014 FINAL  Final  Culture, blood (routine x 2)     Status: None (Preliminary result)   Collection Time: 08/09/14 11:04 PM  Result Value Ref Range Status   Specimen Description BLOOD FOREARM RIGHT  Final   Special Requests BOTTLES DRAWN AEROBIC AND ANAEROBIC 5ML  Final   Culture   Final           BLOOD CULTURE RECEIVED NO GROWTH TO DATE CULTURE WILL BE HELD FOR 5 DAYS BEFORE ISSUING A FINAL NEGATIVE REPORT Performed at [facility]    Report Status PENDING  Incomplete  Culture, blood (routine x 2)     Status: None (Preliminary result)   Collection Time: 08/09/14 11:05 PM  Result Value Ref Range Status   Specimen Description BLOOD LEFT ANTECUBITAL  Final   Special Requests BOTTLES DRAWN AEROBIC AND ANAEROBIC 10CC  Final   Culture   Final           BLOOD CULTURE RECEIVED NO GROWTH TO DATE CULTURE WILL BE HELD FOR 5 DAYS BEFORE ISSUING A FINAL NEGATIVE REPORT Performed at [facility]    Report Status PENDING  Incomplete  MRSA PCR Screening     Status: None   Collection Time: 08/10/14 12:11 AM  Result Value Ref Range Status   MRSA by PCR NEGATIVE NEGATIVE Final    Comment:        The GeneXpert MRSA Assay (FDA approved for NASAL specimens only), is one component of a comprehensive MRSA colonization surveillance program. It is not intended to diagnose MRSA infection nor to guide or monitor treatment for MRSA infections.      Labs: Results for orders placed or performed during the hospital encounter of 08/09/14 (from the past 48 hour(s))  Glucose, capillary     Status: Abnormal   Collection Time: 08/11/14 12:02 PM  Result Value Ref Range   Glucose-Capillary 352 (H) 70 - 99 mg/dL  Glucose, capillary     Status: Abnormal   Collection Time: 08/11/14  4:58 PM  Result Value Ref Range   Glucose-Capillary 323 (H) 70  - 99 mg/dL  Glucose, capillary     Status: Abnormal   Collection Time: 08/11/14 11:30 PM  Result Value Ref Range   Glucose-Capillary 266 (H) 70 - 99 mg/dL   Comment 1 Notify RN    Comment 2 Document in Chart   Glucose, capillary     Status: Abnormal   Collection Time: 08/12/14  7:57 AM  Result Value Ref Range   Glucose-Capillary 277 (H) 70 - 99 mg/dL  Glucose, capillary     Status: Abnormal   Collection Time: 08/12/14 11:57 AM  Result Value Ref Range   Glucose-Capillary 225 (H) 70 - 99 mg/dL  Glucose, capillary     Status: Abnormal   Collection Time: 08/12/14  5:27 PM  Result Value Ref Range   Glucose-Capillary 238 (H) 70 - 99 mg/dL  Glucose, capillary     Status: Abnormal   Collection Time: 08/12/14  8:57 PM  Result Value Ref Range   Glucose-Capillary 222 (H) 70 - 99 mg/dL   Comment 1 Notify RN    Comment 2  Document in Chart   CBC     Status: Abnormal   Collection Time: 08/13/14  7:15 AM  Result Value Ref Range   WBC 8.7 4.0 - 10.5 K/uL   RBC 3.57 (L) 3.87 - 5.11 MIL/uL   Hemoglobin 10.2 (L) 12.0 - 15.0 g/dL   HCT 33.1 (L) 36.0 - 46.0 %   MCV 92.7 78.0 - 100.0 fL   MCH 28.6 26.0 - 34.0 pg   MCHC 30.8 30.0 - 36.0 g/dL   RDW 13.6 11.5 - 15.5 %   Platelets 269 150 - 400 K/uL  Comprehensive metabolic panel     Status: Abnormal   Collection Time: 08/13/14  7:15 AM  Result Value Ref Range   Sodium 144 135 - 145 mmol/L   Potassium 4.4 3.5 - 5.1 mmol/L   Chloride 107 96 - 112 mmol/L   CO2 25 19 - 32 mmol/L   Glucose, Bld 226 (H) 70 - 99 mg/dL   BUN 39 (H) 6 - 23 mg/dL   Creatinine, Ser 1.79 (H) 0.50 - 1.10 mg/dL   Calcium 9.3 8.4 - 10.5 mg/dL   Total Protein 6.3 6.0 - 8.3 g/dL   Albumin 2.5 (L) 3.5 - 5.2 g/dL   AST 17 0 - 37 U/L   ALT 12 0 - 35 U/L   Alkaline Phosphatase 58 39 - 117 U/L   Total Bilirubin 0.2 (L) 0.3 - 1.2 mg/dL   GFR calc non Af Amer 26 (L) >90 mL/min   GFR calc Af Amer 30 (L) >90 mL/min    Comment: (NOTE) The eGFR has been calculated using the CKD  EPI equation. This calculation has not been validated in all clinical situations. eGFR's persistently <90 mL/min signify possible Chronic Kidney Disease.    Anion gap 12 5 - 15  Glucose, capillary     Status: Abnormal   Collection Time: 08/13/14  7:42 AM  Result Value Ref Range   Glucose-Capillary 185 (H) 70 - 99 mg/dL     HPI :BIRIDIANA TWARDOWSKI is a 80 y.o. female with past medical history of hypertension, hyperlipidemia, GERD, hypothyroidism, diabetes mellitus, COPD, peripheral vascular disease, chronic kidney disease-stage III, history of TIA, carotid artery stenosis, history of tobacco abuse, congestive heart failure, who presents with altered mental status and hypokalemia.  Patient has altered mental status, history is obtained from EMS and ED's report. It seems that patient was noticed being unresponsive by staff in nursing home. EMS was called. They report patient was initially cool, pale, diaphoretic. Patient was found to have CBG of 35. EMS initiated IV and gave D50 25 g on scene. They report patient's appearance and mental status improved almost immediately. Per family, patient's body weight has increased a lot recently, not know exact number. When I evaluated pt in ED, she could answer some questions. She denies fever, chills, chest pain, cough, shortness of breath. She denies symptoms for UTI, but urinalysis positive for UTI. Patient denies abdominal pain, diarrhea, dysuria, urgency, frequency, hematuria, skin rashes. No unilateral weakness, numbness or tingling sensations. No vision change or hearing loss.  In ED, patient was found to have leukocytosis with WBC 14.8, positive urinalysis for UTI, AoCKD-III, chest x-ray is negative for acute abnormalities. Temperature normal. ABG showed a pH of 7.322, PCO2 55.2, PaO2 80%. Patient is admitted to inpatient for further evaluation and treatment  HOSPITAL COURSE:    Hypoglycemia resolved now hyperglycemic : Probably secondary to worsening  renal function. Lantus held on admission. CBGs will were  stable-infact now increasing, continue SSI for now. A1c 7.1. Restarted lantus 3/28 , slowly increased dose to 40 units, patient takes 60 units at home Recommend Accu-Cheks 3 times a day before meals   Acute encephalopathy: Likely secondary to hypoglycemia in the setting of renal insufficiency. Patient was also treated for UTI, no focal neurologic deficits Patient was also on a higher dose of gabapentin then she should, this has been adjusted Encephalopathy has resolved   UTI: Status post-treatment with aztreonam 3 days, afebrile, leukocytosis decreasing. Appears nontoxic.Urine culture shows insignificant growth,   Blood cultures negative so far.    Acute on chronic renal failure stage III: Baseline creatinine around 1.7-1.8, peaked at 2.5 Trending down with IV hydration, creatinine 1.79 prior to discharge Follow-up CMP on a weekly basis Lasix is being resumed at half the home dose   Type 2 diabetes: Lantus was held on admission because of hypoglycemia. CBG's have now increased, dose increased to 40 units   Hypothyroidism: Continue Synthroid   Dyslipidemia: Continue Crestor   Hypertension: Controlled, continue Coreg, amlodipine and clonidine    Chronic diastolic heart failure: Clinically compensated. EF around 55-60% by echo on 01/27/12. Lasix on resumed at half the dose because of worsening renal function. Continue Coreg,  .   CAD (coronary artery disease): Without shortness of breath or chest pain. Continue aspirin, Coreg, and statin.    History of COPD: Lungs are clear. Continue with as needed bronchodilators    Gastroesophageal reflux disease: Continue PPI, SLP eval   Left ankle fracture: Secondary to a recent fall on 3/24. She was seen in the emergency room on 3/24, placed on a short leg splint, and asked to follow-up with orthopedics-Dr. Lorin Mercy.This MD spoke with Dr Erlinda Hong on 3/28-who reviewed XRay and recommended  walking boot and WBAT. To follow with Dr Erlinda Hong on discharge at [facility].   Discharge Exam:    Blood pressure 168/44, pulse 61, temperature 99.3 F (37.4 C), temperature source Oral, resp. rate 17, height '5\' 3"'  (1.6 m), weight 87.3 kg (192 lb 7.4 oz), SpO2 96 %. Gen Exam: Awake and alert with clear speech.  Neck: Supple, No JVD.  Chest: B/L Clear.  CVS: S1 S2 Regular, no murmurs.  Abdomen: soft, BS +, non tender, non distended.  Extremities: no edema, lower extremities warm to touch. Left leg in splint-did not open. Left toes were warm, with Good capillary refill Neurologic: Non Focal.         Discharge Instructions    Diet - low sodium heart healthy    Complete by:  As directed      Increase activity slowly    Complete by:  As directed            Follow-up Information    Follow up with SHAW,KIMBERLEE, MD. Schedule an appointment as soon as possible for a visit in 1 week.   Specialty:  Family Medicine   Contact information:   [address] [place] [address] [phone]       Signed: Reyne Dumas 08/13/2014, 11:26 AM

## 2014-08-13 NOTE — Progress Notes (Signed)
Inpatient Diabetes Program Recommendations  AACE/ADA: New Consensus Statement on Inpatient Glycemic Control (2013)  Target Ranges:  Prepandial:   less than 140 mg/dL      Peak postprandial:   less than 180 mg/dL (1-2 hours)      Critically ill patients:  140 - 180 mg/dL   Results for [name], [name] (MRN [id number]) as of 08/13/2014 09:37  Ref. Range 08/12/2014 07:57 08/12/2014 11:57 08/12/2014 17:27 08/12/2014 20:57 08/13/2014 07:42  Glucose-Capillary Latest Range: 70-99 mg/dL 277 (H) 225 (H) 238 (H) 222 (H) 185 (H)   Diabetes history: DM2 Outpatient Diabetes medications: Lantus 60 units QHS, Humalog 0-22 units TID with meals Current orders for Inpatient glycemic control: Lantus 22 units daily, Novolog 0-9 units TID with meals  Inpatient Diabetes Program Recommendations Insulin - Basal: Patient received a total of Lantus 25 units on 3/29. Please consider increasing Lantus to 25 units daily. Correction (SSI): Please consider increasing Novolog correction to moderate scale and adding Novolog bedtime correction scale.  Thanks, Barnie Alderman, RN, MSN, CCRN, CDE Diabetes Coordinator Inpatient Diabetes Program [phone] (Team Pager from [place] to [place]) [phone] ([place] office) [phone] [facility] office)

## 2014-08-13 NOTE — Telephone Encounter (Signed)
Rx faxed to [facility] @ [phone], phone number [phone]  

## 2014-08-13 NOTE — Progress Notes (Signed)
Speech Language Pathology Treatment: Dysphagia  Patient Details Name: FEY COGHILL MRN: [id number] DOB: May 28, 1934 Today's Date: 08/13/2014 Time:  -     Assessment / Plan / Recommendation Clinical Impression  Dysphagia treatment provided today for diet tolerance check and review of reflux precautions/ compensatory strategies. The pt demonstrated no overt s/s of aspiration with thin liquids or cracker. Pt reports that dysphagia 3 is much better- no longer having globus sensation during meals. Recommend continuing this diet at next level of care- dysphagia 3, thin liquids, meds whole with liquid. SLP will sign off at this time- do not feel that SLP will need to follow at next level of care as there are no s/s of aspiration and pt understands/ demonstrates use of strategies.   HPI HPI: Pt is a 80 y.o. female with PMH of hypertension, hyperlipidemia, GERD, hypothyroidism, diabetes mellitus, COPD, peripheral vascular disease, chronic kidney disease-stage III, history of TIA, carotid artery stenosis, history of tobacco abuse, congestive heart failure, who presents with altered mental status, hypokalemia, UTI, acute encephalopathy. Pt resides in SNF. CXR negative. RN reported difficulty swallowing this a.m.- bedside swallow eval ordered.   Pertinent Vitals Pain Assessment: No/denies pain  SLP Plan  All goals met    Recommendations Diet recommendations: Dysphagia 3 (mechanical soft);Thin liquid Liquids provided via: Straw;Cup Medication Administration: Whole meds with liquid Supervision: Patient able to self feed;Intermittent supervision to cue for compensatory strategies Compensations: Slow rate;Small sips/bites;Follow solids with liquid Postural Changes and/or Swallow Maneuvers: Seated upright 90 degrees;Upright 30-60 min after meal              Plan: All goals met    GO     Kern Reap, MA, CCC-SLP 08/13/2014, 10:46 AM  [phone]

## 2014-08-13 NOTE — Progress Notes (Signed)
Patient was discharge to SNF. Reported to receiving nurse. Patient alert and oriented, no distress noted. [facility] transported patient from hospital to SNF. Gave Vicodin at 3pm due to left ankle pain. Daughter made aware of transport plans. All needs met. No further needs noted at this time.

## 2014-08-14 ENCOUNTER — Encounter: Payer: Self-pay | Admitting: Registered Nurse

## 2014-08-14 ENCOUNTER — Non-Acute Institutional Stay (SKILLED_NURSING_FACILITY): Payer: Medicare HMO | Admitting: Registered Nurse

## 2014-08-14 DIAGNOSIS — F329 Major depressive disorder, single episode, unspecified: Secondary | ICD-10-CM

## 2014-08-14 DIAGNOSIS — I2510 Atherosclerotic heart disease of native coronary artery without angina pectoris: Secondary | ICD-10-CM

## 2014-08-14 DIAGNOSIS — K219 Gastro-esophageal reflux disease without esophagitis: Secondary | ICD-10-CM | POA: Diagnosis not present

## 2014-08-14 DIAGNOSIS — N183 Chronic kidney disease, stage 3 unspecified: Secondary | ICD-10-CM

## 2014-08-14 DIAGNOSIS — S82892D Other fracture of left lower leg, subsequent encounter for closed fracture with routine healing: Secondary | ICD-10-CM

## 2014-08-14 DIAGNOSIS — I5189 Other ill-defined heart diseases: Secondary | ICD-10-CM

## 2014-08-14 DIAGNOSIS — G9340 Encephalopathy, unspecified: Secondary | ICD-10-CM | POA: Diagnosis not present

## 2014-08-14 DIAGNOSIS — K5900 Constipation, unspecified: Secondary | ICD-10-CM | POA: Diagnosis not present

## 2014-08-14 DIAGNOSIS — I10 Essential (primary) hypertension: Secondary | ICD-10-CM | POA: Diagnosis not present

## 2014-08-14 DIAGNOSIS — E785 Hyperlipidemia, unspecified: Secondary | ICD-10-CM

## 2014-08-14 DIAGNOSIS — E039 Hypothyroidism, unspecified: Secondary | ICD-10-CM | POA: Diagnosis not present

## 2014-08-14 DIAGNOSIS — F32A Depression, unspecified: Secondary | ICD-10-CM

## 2014-08-14 DIAGNOSIS — J449 Chronic obstructive pulmonary disease, unspecified: Secondary | ICD-10-CM

## 2014-08-14 DIAGNOSIS — N390 Urinary tract infection, site not specified: Secondary | ICD-10-CM

## 2014-08-14 DIAGNOSIS — R531 Weakness: Secondary | ICD-10-CM

## 2014-08-14 DIAGNOSIS — E1169 Type 2 diabetes mellitus with other specified complication: Secondary | ICD-10-CM

## 2014-08-14 DIAGNOSIS — D649 Anemia, unspecified: Secondary | ICD-10-CM

## 2014-08-14 DIAGNOSIS — I519 Heart disease, unspecified: Secondary | ICD-10-CM

## 2014-08-14 NOTE — Progress Notes (Signed)
Patient ID: [name], female   DOB: Jan 24, 1935, 80 y.o.   MRN: [id number]   Place of Service: [facility]  Allergies  Allergen Reactions  . Ace Inhibitors Other (See Comments)    Listed on MAR  . Codeine Other (See Comments)    Listed on MAR  . Lisinopril Other (See Comments)    unknown  . Penicillins Hives  . Trimethoprim   . Septra [Sulfamethoxazole-Trimethoprim] Hives and Rash  . Sulfur Hives and Rash    Code Status: DNR  Goals of Care: Comfort and Quality of Life/STR  Chief Complaint  Patient presents with  . Hospitalization Follow-up    HPI 80 y.o. female with PMH of TIA, DM2, HTN, CHF, COPD, CAD, diastolic dysfunction grade II, CKD3, left ankle strain among others is being seen for a follow-up visit post hospital admission from 08/09/14 to 08/13/14 for acute encephalopathy and acute on chronic renal failure secondary to hypoglycemia with cbg 35 and UTI. She was treated with aztreonam x 3 days. Is here for short term rehab and her goal is to return home. Seen in room today. Denies any concerns.   Review of Systems Constitutional: Negative for fever and chills. HENT: Negative for ear pain, congestion, and sore throat Eyes: Negative for eye pain and eye discharge  Cardiovascular: Negative for chest pain, palpitations, and leg swelling Respiratory: Negative cough, shortness of breath, and wheezing.  Gastrointestinal: Negative for nausea and vomiting. Negative for abdominal pain, diarrhea and constipation.  Genitourinary: Negative for dysuria Musculoskeletal: Negative for uncontrolled pain Neurological: Negative for dizziness and headache.  Skin: Negative for rash and wound.   Psychiatric: Negative for depression  Past Medical History  Diagnosis Date  . Hypertension   . Macular degeneration, bilateral   . Type II diabetes mellitus   . COPD (chronic obstructive pulmonary disease)   . Diabetic peripheral neuropathy   . Hypercholesteremia   . PAD  (peripheral artery disease)     a. 1997 s/p R fem-pop bypass;  b. 03/1999 R CIA stenting.  . CKD (chronic kidney disease), stage III   . Headache(784.0)   . Hypothyroid   . Non-obstructive CAD     a. 11/2005 Cath: LM nl, LAD 30 diffuse, LCX small, 20p, 31m/d, 75d into OM, RCA dominant, 40p, 30-40 diffuse, EF 55%.  Marland Kitchen TIA (transient ischemic attack)     a. 09/2013.  . H/O echocardiogram     a. 09/2013 Echo: nl LV fxn, no rwma, mildly to mod dil LA, mildly dil RA.  . Carotid arterial disease     a. 09/2013 Carotid U/S: 40-59% bilat ICA stenosis.  . Enterococcus UTI     a. 09/2013.  . Tobacco abuse   . Cholelithiasis   . Pyelonephritis     Past Surgical History  Procedure Laterality Date  . Tonsillectomy  1940's  . Abdominal hysterectomy  1970's  . Dilation and curettage of uterus  1970's    "probably 2" (11/01/2012)  . Cataract extraction w/ intraocular lens  implant, bilateral Bilateral ~ 2010  . Bypass graft Right ~ 1997    RLE by Dr. Gwenlyn Perking 11/29/2005 (11/01/2012)  . Cystostomy w/ bladder biopsy  2005    [facility] Endo 09/19/2003 (11/01/2012)  . Anterior cervical decomp/discectomy fusion  01/02/2006    [facility] Endo 01/02/2006 (11/01/2012)  . Shoulder open rotator cuff repair Bilateral 1990's-2000's    "3X on the left; twice on the right" (11/01/2012)  . Cardiac catheterization  11/29/2005    [facility] Endo 11/29/2005 (11/01/2012)  .  Esophagogastroduodenoscopy N/A 10/14/2013    Procedure: ESOPHAGOGASTRODUODENOSCOPY (EGD);  Surgeon: Missy Sabins, MD;  Location: [facility];  Service: Endoscopy;  Laterality: N/A;    History  Substance Use Topics  . Smoking status: Current Every Day Smoker -- 0.12 packs/day for 60 years    Types: Cigarettes  . Smokeless tobacco: Never Used     Comment: states she smokes 4 cigs per day  . Alcohol Use: No      Medication List       This list is accurate as of: 08/14/14  1:57 PM.  Always use your most recent med list.               acetaminophen 500 MG tablet    Commonly known as:  TYLENOL  Take 500 mg by mouth 3 (three) times daily. 8am 12pm, 4pm     albuterol (2.5 MG/3ML) 0.083% nebulizer solution  Commonly known as:  PROVENTIL  Take 2.5 mg by nebulization every 6 (six) hours as needed for wheezing.     ALPRAZolam 0.25 MG tablet  Commonly known as:  XANAX  Take 1 tablet (0.25 mg total) by mouth 2 (two) times daily as needed for anxiety.     alum & mag hydroxide-simeth 200-200-20 MG/5ML suspension  Commonly known as:  MAALOX/MYLANTA  Take 30 mLs by mouth every 6 (six) hours as needed for indigestion or heartburn. Do not exceed 4 doses in 24 hours     amitriptyline 50 MG tablet  Commonly known as:  ELAVIL  Take 50 mg by mouth at bedtime.     amLODipine 10 MG tablet  Commonly known as:  NORVASC  Take 10 mg by mouth at bedtime.     aspirin 81 MG chewable tablet  Chew 81 mg by mouth daily.     B-complex with vitamin C tablet  Take 1 tablet by mouth daily.     bisacodyl 10 MG suppository  Commonly known as:  DULCOLAX  Place 10 mg rectally as needed for moderate constipation.     carvedilol 25 MG tablet  Commonly known as:  COREG  Take 25 mg by mouth 2 (two) times daily with a meal. 0800 and 1700     cloNIDine 0.2 MG tablet  Commonly known as:  CATAPRES  Take 0.2 mg by mouth 2 (two) times daily. Hold if blood pressure less than 100/60     Cranberry 500 MG Caps  Take 1,000 mg by mouth 2 (two) times daily.     docusate sodium 100 MG capsule  Commonly known as:  COLACE  Take 1 capsule (100 mg total) by mouth every 12 (twelve) hours.     escitalopram 20 MG tablet  Commonly known as:  LEXAPRO  Take 20 mg by mouth daily.     feeding supplement (GLUCERNA SHAKE) Liqd  Take 237 mLs by mouth 2 (two) times daily between meals.     furosemide 40 MG tablet  Commonly known as:  LASIX  Take 1 tablet (40 mg total) by mouth daily. 8am 2pm     gabapentin 300 MG capsule  Commonly known as:  NEURONTIN  Take 1 capsule (300 mg total) by  mouth 2 (two) times daily.     guaiFENesin 100 MG/5ML liquid  Commonly known as:  ROBITUSSIN  Take 100 mg by mouth every 6 (six) hours as needed for cough. Not to exceed 4 doses in 24 hours     HYDROcodone-acetaminophen 5-325 MG per tablet  Commonly known as:  NORCO/VICODIN  1 by mouth every 6 hours as needed for moderate pain, 2 by mouth every 6 hours as needed for sever pain DO NOT EXCEED 4 GM OF APAP IN 24 HOURS     insulin glargine 100 UNIT/ML injection  Commonly known as:  LANTUS  Inject 0.4 mLs (40 Units total) into the skin daily.     insulin lispro 100 UNIT/ML injection  Commonly known as:  HUMALOG  Inject 0-22 Units into the skin 3 (three) times daily with meals. CBG 0-90 0 units, 91-150 6 units, 151-200 10 units, 201-250 12 units, 251-300 14 units, 301-350 16 units, 351-400 18 units, 401-450 22 units, 451 or more 22 units and call md     levothyroxine 50 MCG tablet  Commonly known as:  SYNTHROID, LEVOTHROID  Take 50 mcg by mouth daily before breakfast.     loperamide 2 MG capsule  Commonly known as:  IMODIUM  Take 2 mg by mouth as needed for diarrhea or loose stools. Do not exceed 8 doses in 24 hours     loratadine 10 MG tablet  Commonly known as:  CLARITIN  Take 10 mg by mouth daily.     MINERIN Crea  Apply 1 application topically 2 (two) times daily. Apply to both lower extremities and feet twice daily     ondansetron 4 MG disintegrating tablet  Commonly known as:  ZOFRAN ODT  Take 1 tablet (4 mg total) by mouth every 8 (eight) hours as needed for nausea or vomiting.     pantoprazole 40 MG tablet  Commonly known as:  PROTONIX  Take 1 tablet (40 mg total) by mouth daily.     Polyethyl Glycol-Propyl Glycol 0.4-0.3 % Soln  Place 1 drop into both eyes 3 (three) times daily. Systane PF     polyethylene glycol packet  Commonly known as:  MIRALAX / GLYCOLAX  Take 17 g by mouth daily. Mix in 8 oz liquid and drink     PRESERVISION AREDS Caps  Take 1 capsule by  mouth daily.     RISAMINE 0.44-20.625 % Oint  Generic drug:  Menthol-Zinc Oxide  Apply 1 application topically as needed (apply with each incontinence change).     rosuvastatin 20 MG tablet  Commonly known as:  CRESTOR  Take 20 mg by mouth every evening. 4pm     senna-docusate 8.6-50 MG per tablet  Commonly known as:  Senokot-S  Take 2 tablets by mouth 2 (two) times daily.     Vitamin D (Ergocalciferol) 50000 UNITS Caps capsule  Commonly known as:  DRISDOL  Take 50,000 Units by mouth every 7 (seven) days. On Saturdays        Physical Exam  BP 147/56 mmHg  Pulse 64  Temp(Src) 97.8 F (36.6 C)  Resp 16  Ht 5\' 3"  (1.6 m)  Wt 191 lb 3.2 oz (86.728 kg)  BMI 33.88 kg/m2  SpO2 97%  Constitutional: Obese but frail elderly female in no acute distress. Conversant and pleasant HEENT: Normocephalic and atraumatic. PERRL. EOM intact. No scleral icterus. Oral mucosa dry. Posterior pharynx clear of any exudate or lesions.  Neck: Supple and nontender. No lymphadenopathy, masses, or thyromegaly. No JVD or carotid bruits. Cardiac: Normal S1, S2. RRR without appreciable murmurs, rubs, or gallops. Right distal pulses intact. LLE with brisk cap refill. Trace dependent edema.  Lungs: Unlabored respiration. Breath sounds clear bilaterally without rales, rhonchi, or wheezes. Abdomen: Audible bowel sounds in all quadrants. Soft, nontender, nondistended. Musculoskeletal: able to move  all extremities. LLE immobilizer/boot in place.  Skin: Warm and dry.  Neurological: Arousable Psychiatric: Appropriate mood and affect for situation.   Labs Reviewed  CBC Latest Ref Rng 08/13/2014 08/10/2014 08/09/2014  WBC 4.0 - 10.5 K/uL 8.7 13.3(H) 14.8(H)  Hemoglobin 12.0 - 15.0 g/dL 10.2(L) 10.7(L) 10.8(L)  Hematocrit 36.0 - 46.0 % 33.1(L) 34.5(L) 35.9(L)  Platelets 150 - 400 K/uL 269 228 225    CMP Latest Ref Rng 08/13/2014 08/11/2014 08/10/2014  Glucose 70 - 99 mg/dL 226(H) 296(H) 135(H)  BUN 6 - 23 mg/dL  39(H) 53(H) 50(H)  Creatinine 0.50 - 1.10 mg/dL 1.79(H) 2.41(H) 2.24(H)  Sodium 135 - 145 mmol/L 144 143 142  Potassium 3.5 - 5.1 mmol/L 4.4 4.4 4.9  Chloride 96 - 112 mmol/L 107 102 100  CO2 19 - 32 mmol/L 25 29 29   Calcium 8.4 - 10.5 mg/dL 9.3 9.3 9.5  Total Protein 6.0 - 8.3 g/dL 6.3 - 7.3  Total Bilirubin 0.3 - 1.2 mg/dL 0.2(L) - 0.3  Alkaline Phos 39 - 117 U/L 58 - 64  AST 0 - 37 U/L 17 - 22  ALT 0 - 35 U/L 12 - 12    Lab Results  Component Value Date   HGBA1C 7.1* 08/10/2014    Lab Results  Component Value Date   TSH 1.155 06/20/2013    Lipid Panel     Component Value Date/Time   CHOL 93 10/08/2013 0105   TRIG 160* 10/08/2013 0105   HDL 33* 10/08/2013 0105   CHOLHDL 2.8 10/08/2013 0105   VLDL 32 10/08/2013 0105   LDLCALC 28 10/08/2013 0105   Diagnostic Studies Reviewed 08/07/14: XRay Left tib/fib FINDINGS: Proximal tibia and fibula are intact. No fracture. The distal tibia and fibula were imaged on concurrently performed ankle radiographs there is mild soft tissue edema, vascular calcifications, and scattered phleboliths.  IMPRESSION: No fracture of the proximal tibia/fibula.  08/07/14: Xray Left ankle 1. Nondisplaced fracture of the distal fibula distal to the ankle mortise. 2. Question nondisplaced distal medial malleolar fracture.  08/07/13: Xray Left foot:  The bones are under mineralized. There are hammertoe deformity of the digits. The nondisplaced distal fibular fracture is better characterized on concurrently performed ankle series. No additional fracture of the foot. Mild soft tissue edema is seen.  IMPRESSION: Bony under mineralization, no acute left foot fracture.  08/09/14: CXR Heart is upper limits normal in size. No confluent airspace opacities or effusions. No acute bony abnormality.  Assessment & Plan 1. Essential hypertension Continue coreg 25mg  twice daily, clonidine 0.2mg  twice daily (hold if BP<100/60), amlodipine 10mg  daily, and  lasix 40mg  daily. Continue to monitor  2. Hypothyroidism, unspecified hypothyroidism type Continue levothyroxine 50mcg daily.   3. Diabetes mellitus 2 Last a1c 7.1. Hypoglycemia resolved. Now hyperglycemic. Continue lantus 40units daily at bedtime and ssi. Continue neurotin 300mg  twice daily for neuropathy. Continue to monitor   4. Acute encephalopathy Resolved. Continue to monitor   5. Left ankle fracture, subsequent encounter Secondary to recent fall on 3/24. Was place on short leg splint. Continue tylenol 500mg  three times daily with norco 5/325mg  1-2 tabs every six hours as needed for pain.   6. Acute on CKD (chronic kidney disease) stage 3, GFR 30-59 ml/min Baseline creatinine around 1.7-1.8. Creatinine at baseline. Avoid nephrotoxic agents, especially NSAIDs and continue to monitor renal functions  7. UTI (lower urinary tract infection) Resolved. Completed 3-day course of aztreonam. Continue cranberry 1000mg  twice daily for UTI prophylaxis. Encourage adequate hydration.   8. GERD  Continue protonix 40mg  daily with mylanta/maalox 41mL every six hours as needed for indigestion/heartburn.   9. Generalized weakness Continue to work with PT/OT for gait/strenghth/balance training to restore/maximize function. Fall risk precautions.   10. COPD, severity to be determined Stable. Continue albuterol 2.5mg  via neb every six hours as needed for shortness of breath and wheezing. Continue to monitor her status.   11. Depression with anxiety and insomnia Continue lexapro 20mg  daily with elavil 50mg  daily at bedtime and xanax 0.25mg  twice daily as needed. Continue to monitor for change in mood.   12. Constipation, unspecified constipation type Continue colace 100mg  twice daily, senna 2 tabs twice daily, and miralax 17 g daily. Encourage hydration and monitor.   13. Diastolic dysfunction, grade II Clinically compensated. Continue lasix 40mg  daily and coreg 25mg  twice daily . EF 55-60% per  Echo on 01/2012. Continue daily weight and monitor her status.   14. Anemia, unspecified Most likely in the setting of CKD. Last hbg 10.2. Continue to monitor h&h  15. CAD Denies any chest pain. Continue asa 81mg  daily, coreg 25mg  twice daily, and crestor 20mg  daily. Monitor clinically  16. HLD LDL 28. Continue crestor 20mg  daily   Diagnostic Studies/Labs Ordered: cbc, cmp in 1 week  Time spent: 45 minutes on care coordination  Family/Staff Communication Plan of care discussed with patient and nursing staff. Patient and nursing staff verbalized understanding and agree with plan of care. No additional questions or concerns reported.    Arthur Holms, MSN, AGNP-C [facility] [address] [place], [place] [address] [phone] [8am-5pm] After hours: [phone]

## 2014-08-15 ENCOUNTER — Non-Acute Institutional Stay (SKILLED_NURSING_FACILITY): Payer: Medicare HMO | Admitting: Internal Medicine

## 2014-08-15 DIAGNOSIS — E1165 Type 2 diabetes mellitus with hyperglycemia: Secondary | ICD-10-CM | POA: Diagnosis not present

## 2014-08-15 DIAGNOSIS — N189 Chronic kidney disease, unspecified: Secondary | ICD-10-CM

## 2014-08-15 DIAGNOSIS — D638 Anemia in other chronic diseases classified elsewhere: Secondary | ICD-10-CM | POA: Diagnosis not present

## 2014-08-15 DIAGNOSIS — S82892A Other fracture of left lower leg, initial encounter for closed fracture: Secondary | ICD-10-CM

## 2014-08-15 DIAGNOSIS — N179 Acute kidney failure, unspecified: Secondary | ICD-10-CM | POA: Diagnosis not present

## 2014-08-15 DIAGNOSIS — G629 Polyneuropathy, unspecified: Secondary | ICD-10-CM

## 2014-08-15 DIAGNOSIS — K5900 Constipation, unspecified: Secondary | ICD-10-CM

## 2014-08-15 DIAGNOSIS — N3000 Acute cystitis without hematuria: Secondary | ICD-10-CM | POA: Diagnosis not present

## 2014-08-15 DIAGNOSIS — E039 Hypothyroidism, unspecified: Secondary | ICD-10-CM | POA: Diagnosis not present

## 2014-08-15 DIAGNOSIS — D72829 Elevated white blood cell count, unspecified: Secondary | ICD-10-CM | POA: Diagnosis not present

## 2014-08-15 DIAGNOSIS — I5032 Chronic diastolic (congestive) heart failure: Secondary | ICD-10-CM | POA: Diagnosis not present

## 2014-08-15 DIAGNOSIS — F418 Other specified anxiety disorders: Secondary | ICD-10-CM

## 2014-08-15 DIAGNOSIS — I10 Essential (primary) hypertension: Secondary | ICD-10-CM

## 2014-08-15 DIAGNOSIS — R5381 Other malaise: Secondary | ICD-10-CM

## 2014-08-15 DIAGNOSIS — E46 Unspecified protein-calorie malnutrition: Secondary | ICD-10-CM

## 2014-08-15 NOTE — Progress Notes (Signed)
Patient ID: [name], female   DOB: [birth year], 80 y.o.   MRN: [id number]     Facility: [facility]   PCP: Mayra Neer, MD  Code Status: DNR  Allergies  Allergen Reactions  . Ace Inhibitors Other (See Comments)    Listed on MAR  . Codeine Other (See Comments)    Listed on MAR  . Lisinopril Other (See Comments)    unknown  . Penicillins Hives  . Trimethoprim   . Septra [Sulfamethoxazole-Trimethoprim] Hives and Rash  . Sulfur Hives and Rash    Chief Complaint  Patient presents with  . New Admit To SNF     HPI:  80 year old patient is here for short term rehabilitation post hospital admission from 08/09/14 to 08/13/14 with acute encephalopathy in setting of hypoglycemia and UTI and ARF.   She has PMH of DM2, HTN, CHF, COPD, CVA, CAD, diastolic dysfunction grade II, CKD3, left ankle strain. She is seen in her room today. She feels weak and is easily tired. She has not had bowel movement for several days. Denies any concerns this visit.     Review of Systems:  Constitutional: Negative for fever, chills, diaphoresis.  HENT: Negative for headache, congestion Eyes: Negative for eye pain, double vision and discharge.  Respiratory: Negative for cough, shortness of breath and wheezing.   Cardiovascular: Negative for chest pain, palpitations, leg swelling.  Gastrointestinal: Negative for heartburn, nausea, vomiting, abdominal pain. Appetite is fair Genitourinary: Negative for dysuria Musculoskeletal: Negative for back pain, falls Skin: Negative for itching, rash.  Neurological: Negative for dizziness, tingling, focal weakness Psychiatric/Behavioral: Negative for depression, anxiety, insomnia   Past Medical History  Diagnosis Date  . Hypertension   . Macular degeneration, bilateral   . Type II diabetes mellitus   . COPD (chronic obstructive pulmonary disease)   . Diabetic peripheral neuropathy   . Hypercholesteremia   . PAD (peripheral  artery disease)     a. 1997 s/p R fem-pop bypass;  b. 03/1999 R CIA stenting.  . CKD (chronic kidney disease), stage III   . Headache(784.0)   . Hypothyroid   . Non-obstructive CAD     a. 11/2005 Cath: LM nl, LAD 30 diffuse, LCX small, 20p, 32m/d, 75d into OM, RCA dominant, 40p, 30-40 diffuse, EF 55%.  Marland Kitchen TIA (transient ischemic attack)     a. 09/2013.  . H/O echocardiogram     a. 09/2013 Echo: nl LV fxn, no rwma, mildly to mod dil LA, mildly dil RA.  . Carotid arterial disease     a. 09/2013 Carotid U/S: 40-59% bilat ICA stenosis.  . Enterococcus UTI     a. 09/2013.  . Tobacco abuse   . Cholelithiasis   . Pyelonephritis    Past Surgical History  Procedure Laterality Date  . Tonsillectomy  1940's  . Abdominal hysterectomy  1970's  . Dilation and curettage of uterus  1970's    "probably 2" (11/01/2012)  . Cataract extraction w/ intraocular lens  implant, bilateral Bilateral ~ 2010  . Bypass graft Right ~ 1997    RLE by Dr. Gwenlyn Perking 11/29/2005 (11/01/2012)  . Cystostomy w/ bladder biopsy  2005    Archie Endo 09/19/2003 (11/01/2012)  . Anterior cervical decomp/discectomy fusion  01/02/2006    [facility] Endo 01/02/2006 (11/01/2012)  . Shoulder open rotator cuff repair Bilateral 1990's-2000's    "3X on the left; twice on the right" (11/01/2012)  . Cardiac catheterization  11/29/2005    Ar[facility] Endo 11/29/2005 (11/01/2012)  . Esophagogastroduodenoscopy  N/A 10/14/2013    Procedure: ESOPHAGOGASTRODUODENOSCOPY (EGD);  Surgeon: Missy Sabins, MD;  Location: [facility];  Service: Endoscopy;  Laterality: N/A;   Social History:   reports that she has been smoking Cigarettes.  She has a 7.2 pack-year smoking history. She has never used smokeless tobacco. She reports that she does not drink alcohol or use illicit drugs.  No family history on file.  Medications: Patient's Medications  New Prescriptions   No medications on file  Previous Medications   ACETAMINOPHEN (TYLENOL) 500 MG TABLET    Take 500 mg by mouth 3  (three) times daily. 8am 12pm, 4pm   ALBUTEROL (PROVENTIL) (2.5 MG/3ML) 0.083% NEBULIZER SOLUTION    Take 2.5 mg by nebulization every 6 (six) hours as needed for wheezing.   ALPRAZOLAM (XANAX) 0.25 MG TABLET    Take 1 tablet (0.25 mg total) by mouth 2 (two) times daily as needed for anxiety.   ALUM & MAG HYDROXIDE-SIMETH (MAALOX/MYLANTA) 200-200-20 MG/5ML SUSPENSION    Take 30 mLs by mouth every 6 (six) hours as needed for indigestion or heartburn. Do not exceed 4 doses in 24 hours   AMITRIPTYLINE (ELAVIL) 50 MG TABLET    Take 50 mg by mouth at bedtime.   AMLODIPINE (NORVASC) 10 MG TABLET    Take 10 mg by mouth at bedtime.    ASPIRIN 81 MG CHEWABLE TABLET    Chew 81 mg by mouth daily.   B COMPLEX-C (B-COMPLEX WITH VITAMIN C) TABLET    Take 1 tablet by mouth daily.   BISACODYL (DULCOLAX) 10 MG SUPPOSITORY    Place 10 mg rectally as needed for moderate constipation.   CARVEDILOL (COREG) 25 MG TABLET    Take 25 mg by mouth 2 (two) times daily with a meal. 0800 and 1700   CLONIDINE (CATAPRES) 0.2 MG TABLET    Take 0.2 mg by mouth 2 (two) times daily. Hold if blood pressure less than 100/60   CRANBERRY 500 MG CAPS    Take 1,000 mg by mouth 2 (two) times daily.   DOCUSATE SODIUM (COLACE) 100 MG CAPSULE    Take 1 capsule (100 mg total) by mouth every 12 (twelve) hours.   ESCITALOPRAM (LEXAPRO) 20 MG TABLET    Take 20 mg by mouth daily.   FEEDING SUPPLEMENT, GLUCERNA SHAKE, (GLUCERNA SHAKE) LIQD    Take 237 mLs by mouth 2 (two) times daily between meals.   FUROSEMIDE (LASIX) 40 MG TABLET    Take 1 tablet (40 mg total) by mouth daily. 8am 2pm   GABAPENTIN (NEURONTIN) 300 MG CAPSULE    Take 1 capsule (300 mg total) by mouth 2 (two) times daily.   GUAIFENESIN (ROBITUSSIN) 100 MG/5ML LIQUID    Take 100 mg by mouth every 6 (six) hours as needed for cough. Not to exceed 4 doses in 24 hours   HYDROCODONE-ACETAMINOPHEN (NORCO/VICODIN) 5-325 MG PER TABLET    1 by mouth every 6 hours as needed for moderate pain, 2  by mouth every 6 hours as needed for sever pain DO NOT EXCEED 4 GM OF APAP IN 24 HOURS   INSULIN GLARGINE (LANTUS) 100 UNIT/ML INJECTION    Inject 0.4 mLs (40 Units total) into the skin daily.   INSULIN LISPRO (HUMALOG) 100 UNIT/ML INJECTION    Inject 0-22 Units into the skin 3 (three) times daily with meals. CBG 0-90 0 units, 91-150 6 units, 151-200 10 units, 201-250 12 units, 251-300 14 units, 301-350 16 units, 351-400 18 units, 401-450 22  units, 451 or more 22 units and call md   LEVOTHYROXINE (SYNTHROID, LEVOTHROID) 50 MCG TABLET    Take 50 mcg by mouth daily before breakfast.    LOPERAMIDE (IMODIUM) 2 MG CAPSULE    Take 2 mg by mouth as needed for diarrhea or loose stools. Do not exceed 8 doses in 24 hours   LORATADINE (CLARITIN) 10 MG TABLET    Take 10 mg by mouth daily.   MENTHOL-ZINC OXIDE (RISAMINE) 0.44-20.625 % OINT    Apply 1 application topically as needed (apply with each incontinence change).    MULTIPLE VITAMINS-MINERALS (PRESERVISION AREDS) CAPS    Take 1 capsule by mouth daily.   ONDANSETRON (ZOFRAN ODT) 4 MG DISINTEGRATING TABLET    Take 1 tablet (4 mg total) by mouth every 8 (eight) hours as needed for nausea or vomiting.   PANTOPRAZOLE (PROTONIX) 40 MG TABLET    Take 1 tablet (40 mg total) by mouth daily.   POLYETHYL GLYCOL-PROPYL GLYCOL 0.4-0.3 % SOLN    Place 1 drop into both eyes 3 (three) times daily. Systane PF   POLYETHYLENE GLYCOL (MIRALAX / GLYCOLAX) PACKET    Take 17 g by mouth daily. Mix in 8 oz liquid and drink   ROSUVASTATIN (CRESTOR) 20 MG TABLET    Take 20 mg by mouth every evening. 4pm   SENNA-DOCUSATE (SENOKOT-S) 8.6-50 MG PER TABLET    Take 2 tablets by mouth 2 (two) times daily.    SKIN PROTECTANTS, MISC. (MINERIN) CREA    Apply 1 application topically 2 (two) times daily. Apply to both lower extremities and feet twice daily   VITAMIN D, ERGOCALCIFEROL, (DRISDOL) 50000 UNITS CAPS CAPSULE    Take 50,000 Units by mouth every 7 (seven) days. On Saturdays  Modified  Medications   No medications on file  Discontinued Medications   No medications on file     Physical Exam: Filed Vitals:   08/15/14 1428  BP: 144/72  Pulse: 64  Temp: 97.8 F (36.6 C)  Resp: 18  SpO2: 97%    General- elderly female, well built, in no acute distress Head- normocephalic, atraumatic Throat- moist mucus membrane Neck- no cervical lymphadenopathy Cardiovascular- normal s1,s2, no murmurs, palpable right dorsalis pedis and both radial pulses, no leg edema Respiratory- bilateral clear to auscultation, no wheeze, no rhonchi, no crackles, no use of accessory muscles Abdomen- bowel sounds present, soft, non tender Musculoskeletal- able to move all 4 extremities, left leg in splint, good capillary refill in left toes Skin- warm and dry Psychiatry- alert and oriented to person, place and time,flat affect    Labs reviewed: Basic Metabolic Panel:  Recent Labs  08/10/14 0608 08/11/14 1000 08/13/14 0715  NA 142 143 144  K 4.9 4.4 4.4  CL 100 102 107  CO2 29 29 25   GLUCOSE 135* 296* 226*  BUN 50* 53* 39*  CREATININE 2.24* 2.41* 1.79*  CALCIUM 9.5 9.3 9.3   Liver Function Tests:  Recent Labs  08/09/14 1908 08/10/14 0608 08/13/14 0715  AST 19 22 17   ALT 10 12 12   ALKPHOS 62 64 58  BILITOT 0.6 0.3 0.2*  PROT 7.0 7.3 6.3  ALBUMIN 2.9* 2.9* 2.5*    Recent Labs  10/11/13 0400  LIPASE 15   No results for input(s): AMMONIA in the last 8760 hours. CBC:  Recent Labs  10/07/13 1231  10/11/13 0400  08/09/14 1908 08/10/14 0608 08/13/14 0715  WBC 14.2*  < > 14.6*  < > 14.8* 13.3* 8.7  NEUTROABS  9.3*  --  11.1*  --  11.6*  --   --   HGB 10.2*  < > 12.6  < > 10.8* 10.7* 10.2*  HCT 32.1*  < > 39.5  < > 35.9* 34.5* 33.1*  MCV 90.9  < > 90.4  < > 93.2 92.2 92.7  PLT 188  < > 246  < > 225 228 269  < > = values in this interval not displayed. Cardiac Enzymes:  Recent Labs  08/10/14 0100 08/10/14 0608 08/10/14 1152  TROPONINI <0.03 0.03 <0.03    BNP: Invalid input(s): POCBNP CBG:  Recent Labs  08/12/14 2057 08/13/14 0742 08/13/14 1149  GLUCAP 222* 185* 245*   08/15/14 wbc 11.6, hb 10.2, hct 32.2, plt 276, na 142, k 4.6, bun 38, cr 1.82, glu 257, alb 3   Assessment/Plan  Physical deconditioning Will have her work with physical therapy and occupational therapy team to help with gait training and muscle strengthening exercises.fall precautions. Skin care. Encourage to be out of bed.   UTI Completed course of antibiotics.currently asymptomatic. Encouraged hydration and continue cranberry supplement  Leukocytosis Mildly elevated wbc, no signs of infection on exam, monitor clinically with recheck of cbc with diff in 3 days  Acute on CKD  Baseline creatinine around 1.7-1.8. Creatinine at baseline.  Protein calorie malnutrition Low albumin, will need dietary supplement, monitor for muscle wasting, encourage po intake, dietary consult  Diastolic chf Clinically compensated. Continue lasix 40mg  daily and coreg 25mg  twice daily. Monitor bmp and weight  Dm type 2 Hypoglycemia in hospital.  Lab Results  Component Value Date   HGBA1C 7.1* 08/10/2014  monitor cbg, continue lantus 40 u bedtime and SSI with meals  Essential hypertension Stable bp reading, Continue coreg 25mg  bid, clonidine 0.2mg  bid, amlodipine 10mg  daily and lasix 40mg  daily. Continue to monitor renal function  Hypothyroidism Continue levothyroxine 74mcg daily.   Neuropathy Continue neurotin 300mg  bid  Left ankle fracture, subsequent encounter Has short leg splint. Continue tylenol 500mg  three times daily with norco 5/325mg  1-2 tabs every six hours as needed for pain.   GERD Continue protonix 40mg  daily   Depression with anxiety  Continue lexapro 20mg  daily with elavil 50mg  daily at bedtime and xanax 0.25mg  twice daily as needed.   Constipation Continue colace 100mg  twice daily, senna 2 tabs twice daily and miralax 17 g daily. Encourage hydration  and monitor.   Anemia of chronic disease H&h stable.   Goals of care: short term rehabilitation   Labs/tests ordered: cbc with diff 08/18/14  Family/ staff Communication: reviewed care plan with patient and nursing supervisor    Blanchie Serve, MD  [place] [phone] (Monday-Friday 8 am - 5 pm) [phone] (afterhours)

## 2014-08-16 LAB — CULTURE, BLOOD (ROUTINE X 2)
Culture: NO GROWTH
Culture: NO GROWTH

## 2014-08-18 LAB — BASIC METABOLIC PANEL
BUN: 38 mg/dL — AB (ref 4–21)
CREATININE: 1.8 mg/dL — AB (ref 0.5–1.1)
Glucose: 257 mg/dL
POTASSIUM: 4.6 mmol/L (ref 3.4–5.3)
SODIUM: 142 mmol/L (ref 137–147)

## 2014-08-18 LAB — CBC AND DIFFERENTIAL
HCT: 33 % — AB (ref 36–46)
Hemoglobin: 10.3 g/dL — AB (ref 12.0–16.0)
Platelets: 272 10*3/uL (ref 150–399)
WBC: 9.9 10*3/mL

## 2014-09-03 ENCOUNTER — Encounter: Payer: Self-pay | Admitting: Registered Nurse

## 2014-09-03 ENCOUNTER — Non-Acute Institutional Stay (SKILLED_NURSING_FACILITY): Payer: Medicare HMO | Admitting: Registered Nurse

## 2014-09-03 DIAGNOSIS — K219 Gastro-esophageal reflux disease without esophagitis: Secondary | ICD-10-CM

## 2014-09-03 DIAGNOSIS — C4491 Basal cell carcinoma of skin, unspecified: Secondary | ICD-10-CM

## 2014-09-03 DIAGNOSIS — R5381 Other malaise: Secondary | ICD-10-CM

## 2014-09-03 DIAGNOSIS — E039 Hypothyroidism, unspecified: Secondary | ICD-10-CM

## 2014-09-03 DIAGNOSIS — K5900 Constipation, unspecified: Secondary | ICD-10-CM | POA: Diagnosis not present

## 2014-09-03 DIAGNOSIS — E1165 Type 2 diabetes mellitus with hyperglycemia: Secondary | ICD-10-CM | POA: Diagnosis not present

## 2014-09-03 DIAGNOSIS — I2510 Atherosclerotic heart disease of native coronary artery without angina pectoris: Secondary | ICD-10-CM | POA: Diagnosis not present

## 2014-09-03 DIAGNOSIS — D638 Anemia in other chronic diseases classified elsewhere: Secondary | ICD-10-CM | POA: Diagnosis not present

## 2014-09-03 DIAGNOSIS — N179 Acute kidney failure, unspecified: Secondary | ICD-10-CM

## 2014-09-03 DIAGNOSIS — I519 Heart disease, unspecified: Secondary | ICD-10-CM

## 2014-09-03 DIAGNOSIS — F418 Other specified anxiety disorders: Secondary | ICD-10-CM

## 2014-09-03 DIAGNOSIS — I10 Essential (primary) hypertension: Secondary | ICD-10-CM | POA: Diagnosis not present

## 2014-09-03 DIAGNOSIS — Z720 Tobacco use: Secondary | ICD-10-CM | POA: Diagnosis not present

## 2014-09-03 DIAGNOSIS — I5189 Other ill-defined heart diseases: Secondary | ICD-10-CM

## 2014-09-03 DIAGNOSIS — J449 Chronic obstructive pulmonary disease, unspecified: Secondary | ICD-10-CM

## 2014-09-03 DIAGNOSIS — S82892A Other fracture of left lower leg, initial encounter for closed fracture: Secondary | ICD-10-CM

## 2014-09-03 DIAGNOSIS — E785 Hyperlipidemia, unspecified: Secondary | ICD-10-CM

## 2014-09-03 DIAGNOSIS — N189 Chronic kidney disease, unspecified: Secondary | ICD-10-CM

## 2014-09-03 DIAGNOSIS — L84 Corns and callosities: Secondary | ICD-10-CM

## 2014-09-03 NOTE — Progress Notes (Signed)
Note updated on 09/10/2014 Patient ID: [name], female   DOB: 19-May-1934, 80 y.o.   MRN: [id number]   Place of Service: [facility]  Allergies  Allergen Reactions  . Ace Inhibitors Other (See Comments)    Listed on MAR  . Codeine Other (See Comments)    Listed on MAR  . Lisinopril Other (See Comments)    unknown  . Penicillins Hives  . Trimethoprim   . Septra [Sulfamethoxazole-Trimethoprim] Hives and Rash  . Sulfur Hives and Rash    Code Status: DNR  Goals of Care: Comfort and Quality of Life/STR  Chief Complaint  Patient presents with  . Discharge Note    HPI 80 y.o. female with PMH of TIA, DM2, HTN, CHF, COPD, CAD, diastolic dysfunction grade II, tobacco abuse, CKD3, left ankle fx among others is being seen for a discharge visit. She was here for short term rehab post hospital admission from 08/09/14 to 08/13/14 for acute encephalopathy and acute on chronic renal failure secondary to hypoglycemia with cbg 35 and UTI. She has worked well with therapy team and is ready to be discharged to [facility] with [facility] PT/OT. Seen in room today. Denies any concerns. Reported feeling much better since completion of abx for presumed acute bronchitis-O2 sat 97% on RA., WBC now wnl. Stage 2 ulcer of sacral area healed.   Review of Systems Constitutional: Negative for fever and chills. HENT: Negative for ear pain, congestion, and sore throat Eyes: Negative for eye pain and eye discharge  Cardiovascular: Negative for chest pain, palpitations, and leg swelling Respiratory: Negative cough, shortness of breath, and wheezing.  Gastrointestinal: Negative for nausea and vomiting. Negative for abdominal pain, diarrhea and constipation.  Genitourinary: Negative for dysuria Musculoskeletal: Negative for uncontrolled pain Neurological: Negative for dizziness and headache.  Skin: Negative for rash and wound.   Psychiatric: Negative for depression  Past Medical History  Diagnosis  Date  . Hypertension   . Macular degeneration, bilateral   . Type II diabetes mellitus   . COPD (chronic obstructive pulmonary disease)   . Diabetic peripheral neuropathy   . Hypercholesteremia   . PAD (peripheral artery disease)     a. 1997 s/p R fem-pop bypass;  b. 03/1999 R CIA stenting.  . CKD (chronic kidney disease), stage III   . Headache(784.0)   . Hypothyroid   . Non-obstructive CAD     a. 11/2005 Cath: LM nl, LAD 30 diffuse, LCX small, 20p, 73m/d, 75d into OM, RCA dominant, 40p, 30-40 diffuse, EF 55%.  Marland Kitchen TIA (transient ischemic attack)     a. 09/2013.  . H/O echocardiogram     a. 09/2013 Echo: nl LV fxn, no rwma, mildly to mod dil LA, mildly dil RA.  . Carotid arterial disease     a. 09/2013 Carotid U/S: 40-59% bilat ICA stenosis.  . Enterococcus UTI     a. 09/2013.  . Tobacco abuse   . Cholelithiasis   . Pyelonephritis     Past Surgical History  Procedure Laterality Date  . Tonsillectomy  1940's  . Abdominal hysterectomy  1970's  . Dilation and curettage of uterus  1970's    "probably 2" (11/01/2012)  . Cataract extraction w/ intraocular lens  implant, bilateral Bilateral ~ 2010  . Bypass graft Right ~ 1997    RLE by Dr. Gwenlyn Perking 11/29/2005 (11/01/2012)  . Cystostomy w/ bladder biopsy  2005    Ar[facility] Endo 09/19/2003 (11/01/2012)  . Anterior cervical decomp/discectomy fusion  01/02/2006    /  notes 01/02/2006 (11/01/2012)  . Shoulder open rotator cuff repair Bilateral 1990's-2000's    "3X on the left; twice on the right" (11/01/2012)  . Cardiac catheterization  11/29/2005    Ar[facility] Endo 11/29/2005 (11/01/2012)  . Esophagogastroduodenoscopy N/A 10/14/2013    Procedure: ESOPHAGOGASTRODUODENOSCOPY (EGD);  Surgeon: Missy Sabins, MD;  Location: [facility];  Service: Endoscopy;  Laterality: N/A;    History  Substance Use Topics  . Smoking status: Current Every Day Smoker -- 0.12 packs/day for 60 years    Types: Cigarettes  . Smokeless tobacco: Never Used     Comment: states she smokes 4  cigs per day  . Alcohol Use: No      Medication List       This list is accurate as of: 09/03/14 11:59 PM.  Always use your most recent med list.               acetaminophen 500 MG tablet  Commonly known as:  TYLENOL  Take 500 mg by mouth 3 (three) times daily. 8am 12pm, 4pm     albuterol (2.5 MG/3ML) 0.083% nebulizer solution  Commonly known as:  PROVENTIL  Take 2.5 mg by nebulization every 6 (six) hours as needed for wheezing.     ALPRAZolam 0.25 MG tablet  Commonly known as:  XANAX  Take 1 tablet (0.25 mg total) by mouth 2 (two) times daily as needed for anxiety.     alum & mag hydroxide-simeth 200-200-20 MG/5ML suspension  Commonly known as:  MAALOX/MYLANTA  Take 30 mLs by mouth every 6 (six) hours as needed for indigestion or heartburn. Do not exceed 4 doses in 24 hours     amitriptyline 50 MG tablet  Commonly known as:  ELAVIL  Take 50 mg by mouth at bedtime.     amLODipine 10 MG tablet  Commonly known as:  NORVASC  Take 10 mg by mouth at bedtime.     aspirin 81 MG chewable tablet  Chew 81 mg by mouth daily.     B-complex with vitamin C tablet  Take 1 tablet by mouth daily.     bisacodyl 10 MG suppository  Commonly known as:  DULCOLAX  Place 10 mg rectally as needed for moderate constipation.     carvedilol 25 MG tablet  Commonly known as:  COREG  Take 25 mg by mouth 2 (two) times daily with a meal. 0800 and 1700     cloNIDine 0.2 MG tablet  Commonly known as:  CATAPRES  Take 0.2 mg by mouth 2 (two) times daily. Hold if blood pressure less than 100/60     Cranberry 500 MG Caps  Take 1,000 mg by mouth 2 (two) times daily.     docusate sodium 100 MG capsule  Commonly known as:  COLACE  Take 1 capsule (100 mg total) by mouth every 12 (twelve) hours.     escitalopram 20 MG tablet  Commonly known as:  LEXAPRO  Take 20 mg by mouth daily.     feeding supplement (GLUCERNA SHAKE) Liqd  Take 237 mLs by mouth 2 (two) times daily between meals.      furosemide 40 MG tablet  Commonly known as:  LASIX  Take 1 tablet (40 mg total) by mouth daily. 8am 2pm     gabapentin 300 MG capsule  Commonly known as:  NEURONTIN  Take 1 capsule (300 mg total) by mouth 2 (two) times daily.     guaiFENesin 100 MG/5ML liquid  Commonly known as:  ROBITUSSIN  Take 100 mg by mouth every 6 (six) hours as needed for cough. Not to exceed 4 doses in 24 hours     HYDROcodone-acetaminophen 5-325 MG per tablet  Commonly known as:  NORCO/VICODIN  1 by mouth every 6 hours as needed for moderate pain, 2 by mouth every 6 hours as needed for sever pain DO NOT EXCEED 4 GM OF APAP IN 24 HOURS     insulin glargine 100 UNIT/ML injection  Commonly known as:  LANTUS  Inject 0.4 mLs (40 Units total) into the skin daily.     insulin lispro 100 UNIT/ML injection  Commonly known as:  HUMALOG  Inject 0-22 Units into the skin 3 (three) times daily with meals. CBG 0-90 0 units, 91-150 6 units, 151-200 10 units, 201-250 12 units, 251-300 14 units, 301-350 16 units, 351-400 18 units, 401-450 22 units, 451 or more 22 units and call md     levothyroxine 50 MCG tablet  Commonly known as:  SYNTHROID, LEVOTHROID  Take 50 mcg by mouth daily before breakfast.     loperamide 2 MG capsule  Commonly known as:  IMODIUM  Take 2 mg by mouth as needed for diarrhea or loose stools. Do not exceed 8 doses in 24 hours     loratadine 10 MG tablet  Commonly known as:  CLARITIN  Take 10 mg by mouth daily.     MINERIN Crea  Apply 1 application topically 2 (two) times daily. Apply to both lower extremities and feet twice daily     ondansetron 4 MG disintegrating tablet  Commonly known as:  ZOFRAN ODT  Take 1 tablet (4 mg total) by mouth every 8 (eight) hours as needed for nausea or vomiting.     pantoprazole 40 MG tablet  Commonly known as:  PROTONIX  Take 1 tablet (40 mg total) by mouth daily.     Polyethyl Glycol-Propyl Glycol 0.4-0.3 % Soln  Place 1 drop into both eyes 3 (three)  times daily. Systane PF     polyethylene glycol packet  Commonly known as:  MIRALAX / GLYCOLAX  Take 17 g by mouth daily. Mix in 8 oz liquid and drink     PRESERVISION AREDS Caps  Take 1 capsule by mouth daily.     RISAMINE 0.44-20.625 % Oint  Generic drug:  Menthol-Zinc Oxide  Apply 1 application topically as needed (apply with each incontinence change).     rosuvastatin 20 MG tablet  Commonly known as:  CRESTOR  Take 20 mg by mouth every evening. 4pm     senna-docusate 8.6-50 MG per tablet  Commonly known as:  Senokot-S  Take 2 tablets by mouth 2 (two) times daily.     Vitamin D (Ergocalciferol) 50000 UNITS Caps capsule  Commonly known as:  DRISDOL  Take 50,000 Units by mouth every 7 (seven) days. On Saturdays        Physical Exam  BP 125/74 mmHg  Pulse 77  Temp(Src) 98.3 F (36.8 C)  Resp 18  Ht 5\' 3"  (1.6 m)  Wt 196 lb 4.8 oz (89.041 kg)  BMI 34.78 kg/m2  SpO2 94%  Constitutional: Obese but frail elderly female in no acute distress. Conversant and pleasant HEENT: Normocephalic and atraumatic. PERRL. EOM intact. No scleral icterus. Oral mucosa dry. Posterior pharynx clear of any exudate or lesions. Suture on left cheek dry and intact s/p removal of basal cell carcinoma. No signs of infection noted.  Neck: Supple and nontender. No lymphadenopathy, masses, or thyromegaly. No JVD  or carotid bruits. Cardiac: Normal S1, S2. RRR without appreciable murmurs, rubs, or gallops. Right distal pulses intact. LLE with brisk cap refill. Trace dependent edema.  Lungs: Unlabored respiration. Breath sounds clear bilaterally without rales, rhonchi, or wheezes. Abdomen: Audible bowel sounds in all quadrants. Soft, nontender, nondistended. Musculoskeletal: able to move all extremities.  Skin: Warm and dry. Small preulcerative ulcer on left lateral/posterior heel w/o signs of infection noted. Neurological: Alert and oriented to self Psychiatric: Appropriate mood and affect for  situation.   Labs Reviewed  CBC Latest Ref Rng 09/08/2014 08/18/2014 08/13/2014  WBC - 7.6 9.9 8.7  Hemoglobin 12.0 - 16.0 g/dL 10.3(A) 10.3(A) 10.2(L)  Hematocrit 36 - 46 % 32(A) 33(A) 33.1(L)  Platelets 150 - 399 K/L 204 272 269    CMP Latest Ref Rng 09/08/2014 08/18/2014 08/13/2014  Glucose 70 - 99 mg/dL - - 226(H)  BUN 4 - 21 mg/dL 49(A) 38(A) 39(H)  Creatinine 0.5 - 1.1 mg/dL 1.9(A) 1.8(A) 1.79(H)  Sodium 137 - 147 mmol/L 144 142 144  Potassium 3.4 - 5.3 mmol/L 5.2 4.6 4.4  Chloride 96 - 112 mmol/L - - 107  CO2 19 - 32 mmol/L - - 25  Calcium 8.4 - 10.5 mg/dL - - 9.3  Total Protein 6.0 - 8.3 g/dL - - 6.3  Total Bilirubin 0.3 - 1.2 mg/dL - - 0.2(L)  Alkaline Phos 39 - 117 U/L - - 58  AST 0 - 37 U/L - - 17  ALT 0 - 35 U/L - - 12    Lab Results  Component Value Date   HGBA1C 7.1* 08/10/2014    Lab Results  Component Value Date   TSH 1.155 06/20/2013    Lipid Panel     Component Value Date/Time   CHOL 93 10/08/2013 0105   TRIG 160* 10/08/2013 0105   HDL 33* 10/08/2013 0105   CHOLHDL 2.8 10/08/2013 0105   VLDL 32 10/08/2013 0105   LDLCALC 28 10/08/2013 0105   Diagnostic Studies Reviewed 08/07/14: XRay Left tib/fib FINDINGS: Proximal tibia and fibula are intact. No fracture. The distal tibia and fibula were imaged on concurrently performed ankle radiographs there is mild soft tissue edema, vascular calcifications, and scattered phleboliths.  IMPRESSION: No fracture of the proximal tibia/fibula.  08/07/14: Xray Left ankle 1. Nondisplaced fracture of the distal fibula distal to the ankle mortise. 2. Question nondisplaced distal medial malleolar fracture.  08/07/13: Xray Left foot:  The bones are under mineralized. There are hammertoe deformity of the digits. The nondisplaced distal fibular fracture is better characterized on concurrently performed ankle series. No additional fracture of the foot. Mild soft tissue edema is seen.  IMPRESSION: Bony under  mineralization, no acute left foot fracture.  08/09/14: CXR Heart is upper limits normal in size. No confluent airspace opacities or effusions. No acute bony abnormality.  Assessment & Plan 1. Essential hypertension Stable. Continue coreg 25mg  twice daily, clonidine 0.2mg  twice daily (hold if BP<100/60), amlodipine 10mg  daily, and lasix 40mg  daily. Continue to f/u with PCP  2. Hypothyroidism, unspecified hypothyroidism type Stable. Continue levothyroxine 20mcg daily.   3. Diabetes mellitus 2 Last a1c 7.1. Hypoglycemia resolved. Now hyperglycemic. Continue lantus 40units daily at bedtime and ssi. Continue neurotin 300mg  twice daily for neuropathy. Continue to f/u with PCP  4. Left ankle fracture, subsequent encounter Secondary to recent fall on 3/24. Was place on short leg splint. Continue tylenol 500mg  three times daily with norco 5/325mg  1-2 tabs every six hours as needed for pain. Continue to f/u  with pcp/ortho  5. Acute on CKD (chronic kidney disease) stage 3, GFR 30-59 ml/min Baseline creatinine around 1.7-1.8. Creatinine at baseline. Avoid nephrotoxic agents, especially NSAIDs. PCP to monitor renal functions  6. GERD Stable. Continue protonix 40mg  daily with mylanta/maalox 20mL every six hours as needed for indigestion/heartburn.   7. Generalized weakness Continue to work with [facility] PT/OT for gait/strenghth/balance training to restore/maximize function. Fall risk precautions.   8. COPD, severity to be determined  Stable. No recent exacerbation. Continue albuterol 2.5mg  via neb every six hours as needed for shortness of breath and wheezing.   9. Depression with anxiety and insomnia Mood stable. Continue lexapro 20mg  daily with elavil 50mg  daily at bedtime and xanax 0.25mg  twice daily as needed.   10. Constipation, unspecified constipation type Stable. Continue colace 100mg  twice daily, senna 2 tabs twice daily, and miralax 17 g daily. Encourage hydration   11. Diastolic  dysfunction, grade II Clinically compensated. Continue lasix 40mg  daily and coreg 25mg  twice daily . EF 55-60% per Echo on 01/2012. Continue daily weight and f/u with PCP  12. Anemia in CKD H&H stable. PCP to monitor h&h  13. CAD Denies any chest pain. Continue asa 81mg  daily, coreg 25mg  twice daily, and crestor 20mg  daily.   14. HLD LDL 28. Continue crestor 20mg  daily   15. Tobacco  Abuse Refuses nicotine patch during her stay at SNF. Encourage smoking cessation.   16. Pre-ulcerative corn/callus of left heel Chronic. Monitor for signs of infection. Recommend podiatry for regular foot exam and protective foot wear.   17. Basal cell cancer S/p surgery on 09/08/14 by Dr. Harvel Quale. Sutures in place. Clean sutures 2 x per day with hydrogen peroxide and apply thin layer of Vaseline x 1 week until sutures are removed. Don not cover with a dressing for bandage  Home health services: PT/OT DME required:None PCP follow-up: 30-day supply of prescription medications provided (#30 Norco 5/325)  Family/Staff Communication Plan of care discussed with patient and nursing staff. Patient and nursing staff verbalized understanding and agree with plan of care. No additional questions or concerns reported.    Arthur Holms, MSN, AGNP-C [facility] [address] [place], [place] [address] [phone] [8am-5pm] After hours: [phone]

## 2014-09-04 ENCOUNTER — Non-Acute Institutional Stay (SKILLED_NURSING_FACILITY): Payer: Medicare HMO | Admitting: Registered Nurse

## 2014-09-04 DIAGNOSIS — R0689 Other abnormalities of breathing: Secondary | ICD-10-CM

## 2014-09-04 DIAGNOSIS — R531 Weakness: Secondary | ICD-10-CM

## 2014-09-04 DIAGNOSIS — J189 Pneumonia, unspecified organism: Secondary | ICD-10-CM

## 2014-09-04 DIAGNOSIS — R509 Fever, unspecified: Secondary | ICD-10-CM

## 2014-09-04 NOTE — Progress Notes (Signed)
Patient ID: [name], female   DOB: 1934-12-14, 80 y.o.   MRN: [id number]   Place of Service: [facility]  Allergies  Allergen Reactions  . Ace Inhibitors Other (See Comments)    Listed on MAR  . Codeine Other (See Comments)    Listed on MAR  . Lisinopril Other (See Comments)    unknown  . Penicillins Hives  . Trimethoprim   . Septra [Sulfamethoxazole-Trimethoprim] Hives and Rash  . Sulfur Hives and Rash    Code Status: DNR  Goals of Care: Comfort and Quality of Life/STR  Chief Complaint  Patient presents with  . Acute Visit    shortness of breath    HPI 80 y.o. female with PMH of TIA, DM2, HTN, CHF, COPD, CAD, diastolic dysfunction grade II, CKD3, left ankle fx among others is being seen for an acute visit at the request of nursing staff for shortness of breath and weakness. She supposed to be discharged this Friday 09/05/14 and was seen yesterday for a discharge visit. Per nursing staff, she was doing well until early this morning. She was complaining of shortness of breath and O2 sat was in low 80s. Seen in room today. She reported not feeling well.  Review of Systems Constitutional: Negative for fever and chills. HENT: Negative for ear pain, congestion, and sore throat Eyes: Negative for eye pain and eye discharge  Cardiovascular: Negative for chest pain, palpitations, and leg swelling Respiratory: Negative cough and wheezing. Positive for shortness of breath Gastrointestinal: Negative for nausea and vomiting. Negative for abdominal pain Genitourinary: Negative for dysuria Musculoskeletal: Negative for uncontrolled pain. Positive for generalized weakness Neurological: Negative for dizziness and headache.   Past Medical History  Diagnosis Date  . Hypertension   . Macular degeneration, bilateral   . Type II diabetes mellitus   . COPD (chronic obstructive pulmonary disease)   . Diabetic peripheral neuropathy   . Hypercholesteremia   . PAD (peripheral  artery disease)     a. 1997 s/p R fem-pop bypass;  b. 03/1999 R CIA stenting.  . CKD (chronic kidney disease), stage III   . Headache(784.0)   . Hypothyroid   . Non-obstructive CAD     a. 11/2005 Cath: LM nl, LAD 30 diffuse, LCX small, 20p, 34m/d, 75d into OM, RCA dominant, 40p, 30-40 diffuse, EF 55%.  Marland Kitchen TIA (transient ischemic attack)     a. 09/2013.  . H/O echocardiogram     a. 09/2013 Echo: nl LV fxn, no rwma, mildly to mod dil LA, mildly dil RA.  . Carotid arterial disease     a. 09/2013 Carotid U/S: 40-59% bilat ICA stenosis.  . Enterococcus UTI     a. 09/2013.  . Tobacco abuse   . Cholelithiasis   . Pyelonephritis     Past Surgical History  Procedure Laterality Date  . Tonsillectomy  1940's  . Abdominal hysterectomy  1970's  . Dilation and curettage of uterus  1970's    "probably 2" (11/01/2012)  . Cataract extraction w/ intraocular lens  implant, bilateral Bilateral ~ 2010  . Bypass graft Right ~ 1997    RLE by Dr. Gwenlyn Perking 11/29/2005 (11/01/2012)  . Cystostomy w/ bladder biopsy  2005    Archie Endo 09/19/2003 (11/01/2012)  . Anterior cervical decomp/discectomy fusion  01/02/2006    Ar[facility] Endo 01/02/2006 (11/01/2012)  . Shoulder open rotator cuff repair Bilateral 1990's-2000's    "3X on the left; twice on the right" (11/01/2012)  . Cardiac catheterization  11/29/2005    /  notes 11/29/2005 (11/01/2012)  . Esophagogastroduodenoscopy N/A 10/14/2013    Procedure: ESOPHAGOGASTRODUODENOSCOPY (EGD);  Surgeon: Missy Sabins, MD;  Location: [facility];  Service: Endoscopy;  Laterality: N/A;    History  Substance Use Topics  . Smoking status: Current Every Day Smoker -- 0.12 packs/day for 60 years    Types: Cigarettes  . Smokeless tobacco: Never Used     Comment: states she smokes 4 cigs per day  . Alcohol Use: No      Medication List       This list is accurate as of: 09/04/14 10:10 AM.  Always use your most recent med list.               acetaminophen 500 MG tablet  Commonly known  as:  TYLENOL  Take 500 mg by mouth 3 (three) times daily. 8am 12pm, 4pm     albuterol (2.5 MG/3ML) 0.083% nebulizer solution  Commonly known as:  PROVENTIL  Take 2.5 mg by nebulization every 6 (six) hours as needed for wheezing.     ALPRAZolam 0.25 MG tablet  Commonly known as:  XANAX  Take 1 tablet (0.25 mg total) by mouth 2 (two) times daily as needed for anxiety.     alum & mag hydroxide-simeth 200-200-20 MG/5ML suspension  Commonly known as:  MAALOX/MYLANTA  Take 30 mLs by mouth every 6 (six) hours as needed for indigestion or heartburn. Do not exceed 4 doses in 24 hours     amitriptyline 50 MG tablet  Commonly known as:  ELAVIL  Take 50 mg by mouth at bedtime.     amLODipine 10 MG tablet  Commonly known as:  NORVASC  Take 10 mg by mouth at bedtime.     aspirin 81 MG chewable tablet  Chew 81 mg by mouth daily.     B-complex with vitamin C tablet  Take 1 tablet by mouth daily.     bisacodyl 10 MG suppository  Commonly known as:  DULCOLAX  Place 10 mg rectally as needed for moderate constipation.     carvedilol 25 MG tablet  Commonly known as:  COREG  Take 25 mg by mouth 2 (two) times daily with a meal. 0800 and 1700     cloNIDine 0.2 MG tablet  Commonly known as:  CATAPRES  Take 0.2 mg by mouth 2 (two) times daily. Hold if blood pressure less than 100/60     Cranberry 500 MG Caps  Take 1,000 mg by mouth 2 (two) times daily.     docusate sodium 100 MG capsule  Commonly known as:  COLACE  Take 1 capsule (100 mg total) by mouth every 12 (twelve) hours.     escitalopram 20 MG tablet  Commonly known as:  LEXAPRO  Take 20 mg by mouth daily.     feeding supplement (GLUCERNA SHAKE) Liqd  Take 237 mLs by mouth 2 (two) times daily between meals.     furosemide 40 MG tablet  Commonly known as:  LASIX  Take 1 tablet (40 mg total) by mouth daily. 8am 2pm     gabapentin 300 MG capsule  Commonly known as:  NEURONTIN  Take 1 capsule (300 mg total) by mouth 2 (two)  times daily.     guaiFENesin 100 MG/5ML liquid  Commonly known as:  ROBITUSSIN  Take 100 mg by mouth every 6 (six) hours as needed for cough. Not to exceed 4 doses in 24 hours     HYDROcodone-acetaminophen 5-325 MG per tablet  Commonly  known as:  NORCO/VICODIN  1 by mouth every 6 hours as needed for moderate pain, 2 by mouth every 6 hours as needed for sever pain DO NOT EXCEED 4 GM OF APAP IN 24 HOURS     insulin glargine 100 UNIT/ML injection  Commonly known as:  LANTUS  Inject 0.4 mLs (40 Units total) into the skin daily.     insulin lispro 100 UNIT/ML injection  Commonly known as:  HUMALOG  Inject 0-22 Units into the skin 3 (three) times daily with meals. CBG 0-90 0 units, 91-150 6 units, 151-200 10 units, 201-250 12 units, 251-300 14 units, 301-350 16 units, 351-400 18 units, 401-450 22 units, 451 or more 22 units and call md     levothyroxine 50 MCG tablet  Commonly known as:  SYNTHROID, LEVOTHROID  Take 50 mcg by mouth daily before breakfast.     loperamide 2 MG capsule  Commonly known as:  IMODIUM  Take 2 mg by mouth as needed for diarrhea or loose stools. Do not exceed 8 doses in 24 hours     loratadine 10 MG tablet  Commonly known as:  CLARITIN  Take 10 mg by mouth daily.     MINERIN Crea  Apply 1 application topically 2 (two) times daily. Apply to both lower extremities and feet twice daily     ondansetron 4 MG disintegrating tablet  Commonly known as:  ZOFRAN ODT  Take 1 tablet (4 mg total) by mouth every 8 (eight) hours as needed for nausea or vomiting.     pantoprazole 40 MG tablet  Commonly known as:  PROTONIX  Take 1 tablet (40 mg total) by mouth daily.     Polyethyl Glycol-Propyl Glycol 0.4-0.3 % Soln  Place 1 drop into both eyes 3 (three) times daily. Systane PF     polyethylene glycol packet  Commonly known as:  MIRALAX / GLYCOLAX  Take 17 g by mouth daily. Mix in 8 oz liquid and drink     PRESERVISION AREDS Caps  Take 1 capsule by mouth daily.      RISAMINE 0.44-20.625 % Oint  Generic drug:  Menthol-Zinc Oxide  Apply 1 application topically as needed (apply with each incontinence change).     rosuvastatin 20 MG tablet  Commonly known as:  CRESTOR  Take 20 mg by mouth every evening. 4pm     senna-docusate 8.6-50 MG per tablet  Commonly known as:  Senokot-S  Take 2 tablets by mouth 2 (two) times daily.     Vitamin D (Ergocalciferol) 50000 UNITS Caps capsule  Commonly known as:  DRISDOL  Take 50,000 Units by mouth every 7 (seven) days. On Saturdays        Physical Exam  BP 125/92 mmHg  Pulse 69  Temp(Src) 100.2 F (37.9 C)  Resp 24  Wt 193 lb 12.8 oz (87.907 kg)  SpO2 98%  Constitutional: Obese but frail elderly female in no acute distress. Conversant and pleasant. Unable to feed self this morning.  HEENT: Normocephalic and atraumatic. PERRL. EOM intact. No scleral icterus. Oral mucosa dry. Posterior pharynx clear of any exudate or lesions.  Neck: Supple and nontender. No lymphadenopathy, masses, or thyromegaly. No JVD or carotid bruits. Cardiac: Normal S1, S2. RRR without appreciable murmurs, rubs, or gallops. Right distal pulses intact. LLE with brisk cap refill. Trace dependent edema.  Respiratory: Tachypneic. Poor airway entry on Right lung field. Left lung field with audible rhonchi. Oxygen in place at 2lpm  GI: Audible bowel sounds in  all quadrants. Soft, nontender, nondistended. Musculoskeletal: able to move all extremities with generalized weakness Skin: Warm and dry.  Neurological: Alert and oriented to self, time, and place Psychiatric: Appropriate mood and affect for situation.   Labs Reviewed  CBC Latest Ref Rng 08/18/2014 08/13/2014 08/10/2014  WBC - 9.9 8.7 13.3(H)  Hemoglobin 12.0 - 16.0 g/dL 10.3(A) 10.2(L) 10.7(L)  Hematocrit 36 - 46 % 33(A) 33.1(L) 34.5(L)  Platelets 150 - 399 K/L 272 269 228    CMP Latest Ref Rng 08/18/2014 08/13/2014 08/11/2014  Glucose 70 - 99 mg/dL - 226(H) 296(H)  BUN 4 - 21 mg/dL  38(A) 39(H) 53(H)  Creatinine 0.5 - 1.1 mg/dL 1.8(A) 1.79(H) 2.41(H)  Sodium 137 - 147 mmol/L 142 144 143  Potassium 3.4 - 5.3 mmol/L 4.6 4.4 4.4  Chloride 96 - 112 mmol/L - 107 102  CO2 19 - 32 mmol/L - 25 29  Calcium 8.4 - 10.5 mg/dL - 9.3 9.3  Total Protein 6.0 - 8.3 g/dL - 6.3 -  Total Bilirubin 0.3 - 1.2 mg/dL - 0.2(L) -  Alkaline Phos 39 - 117 U/L - 58 -  AST 0 - 37 U/L - 17 -  ALT 0 - 35 U/L - 12 -    Lab Results  Component Value Date   HGBA1C 7.1* 08/10/2014    Lab Results  Component Value Date   TSH 1.155 06/20/2013    Lipid Panel     Component Value Date/Time   CHOL 93 10/08/2013 0105   TRIG 160* 10/08/2013 0105   HDL 33* 10/08/2013 0105   CHOLHDL 2.8 10/08/2013 0105   VLDL 32 10/08/2013 0105   LDLCALC 28 10/08/2013 0105   Diagnostic Studies Reviewed 08/07/14: XRay Left tib/fib FINDINGS: Proximal tibia and fibula are intact. No fracture. The distal tibia and fibula were imaged on concurrently performed ankle radiographs there is mild soft tissue edema, vascular calcifications, and scattered phleboliths.  IMPRESSION: No fracture of the proximal tibia/fibula.  08/07/14: Xray Left ankle 1. Nondisplaced fracture of the distal fibula distal to the ankle mortise. 2. Question nondisplaced distal medial malleolar fracture.  08/07/13: Xray Left foot:  The bones are under mineralized. There are hammertoe deformity of the digits. The nondisplaced distal fibular fracture is better characterized on concurrently performed ankle series. No additional fracture of the foot. Mild soft tissue edema is seen.  IMPRESSION: Bony under mineralization, no acute left foot fracture.  08/09/14: CXR Heart is upper limits normal in size. No confluent airspace opacities or effusions. No acute bony abnormality.  Assessment & Plan 1. Pneumonia symptoms Stat chest xray, cbc w/ diff and cmp for further evaluation. Continue supplemental oxygen to maintain sat >88%. Wean as tolerated.  Duoneb every 4 hours x 3days then Q4H as needed for shortness of breath. Plan on delaying her discharge for now. Continue to monitor her status.   2. Low grade fever See #1  3. Generalized weakness Most likely in the setting of acute illness. See #1.   Family/Staff Communication Plan of care discussed with patient and nursing staff. Patient and nursing staff verbalized understanding and agree with plan of care. No additional questions or concerns reported.    Arthur Holms, MSN, AGNP-C [facility] [address] [place], [place] [address] [phone] [8am-5pm] After hours: [phone]

## 2014-09-04 NOTE — Progress Notes (Signed)
Patient ID: [name], female   DOB: Oct 23, 1934, 80 y.o.   MRN: [id number] Xray negative for pna at this point and w/o evidence of CHF.  Lab results significant for leukocytosis (wbc 18.3 with left shift). Respiratory exam with more wheezes this afternoon. No clear source of infection could be identified. Has stage 2 on sacral area and small ulcer to left ankle (most likely from LLE boot) but there were no drainage for signs of infection noted. Given hx of COPD, will treat her with a z pack. Repeat cbc w/ diff and bmp on Monday 09/08/14.  Continue to monitor her status

## 2014-09-08 LAB — CBC AND DIFFERENTIAL
HCT: 32 % — AB (ref 36–46)
Hemoglobin: 10.3 g/dL — AB (ref 12.0–16.0)
Platelets: 204 10*3/uL (ref 150–399)
WBC: 7.6 10^3/mL

## 2014-09-08 LAB — BASIC METABOLIC PANEL
BUN: 49 mg/dL — AB (ref 4–21)
Creatinine: 1.9 mg/dL — AB (ref 0.5–1.1)
Potassium: 5.2 mmol/L (ref 3.4–5.3)
Sodium: 144 mmol/L (ref 137–147)

## 2014-10-03 ENCOUNTER — Emergency Department (HOSPITAL_COMMUNITY)
Admission: EM | Admit: 2014-10-03 | Discharge: 2014-10-03 | Disposition: A | Discharge status: Home / Self Care | Payer: Medicare HMO | Source: Home / Self Care | Attending: Emergency Medicine | Admitting: Emergency Medicine

## 2014-10-03 ENCOUNTER — Emergency Department (HOSPITAL_COMMUNITY): Payer: Medicare HMO

## 2014-10-03 ENCOUNTER — Encounter (HOSPITAL_COMMUNITY): Payer: Self-pay | Admitting: Emergency Medicine

## 2014-10-03 DIAGNOSIS — I129 Hypertensive chronic kidney disease with stage 1 through stage 4 chronic kidney disease, or unspecified chronic kidney disease: Secondary | ICD-10-CM | POA: Diagnosis present

## 2014-10-03 DIAGNOSIS — Y9289 Other specified places as the place of occurrence of the external cause: Secondary | ICD-10-CM | POA: Insufficient documentation

## 2014-10-03 DIAGNOSIS — Z882 Allergy status to sulfonamides status: Secondary | ICD-10-CM

## 2014-10-03 DIAGNOSIS — E1142 Type 2 diabetes mellitus with diabetic polyneuropathy: Secondary | ICD-10-CM | POA: Insufficient documentation

## 2014-10-03 DIAGNOSIS — Z7982 Long term (current) use of aspirin: Secondary | ICD-10-CM

## 2014-10-03 DIAGNOSIS — E860 Dehydration: Secondary | ICD-10-CM | POA: Diagnosis present

## 2014-10-03 DIAGNOSIS — Z8619 Personal history of other infectious and parasitic diseases: Secondary | ICD-10-CM | POA: Insufficient documentation

## 2014-10-03 DIAGNOSIS — I2510 Atherosclerotic heart disease of native coronary artery without angina pectoris: Secondary | ICD-10-CM

## 2014-10-03 DIAGNOSIS — W1839XA Other fall on same level, initial encounter: Secondary | ICD-10-CM

## 2014-10-03 DIAGNOSIS — R531 Weakness: Secondary | ICD-10-CM | POA: Insufficient documentation

## 2014-10-03 DIAGNOSIS — Z9889 Other specified postprocedural states: Secondary | ICD-10-CM | POA: Insufficient documentation

## 2014-10-03 DIAGNOSIS — N179 Acute kidney failure, unspecified: Principal | ICD-10-CM | POA: Diagnosis present

## 2014-10-03 DIAGNOSIS — Z981 Arthrodesis status: Secondary | ICD-10-CM

## 2014-10-03 DIAGNOSIS — Z720 Tobacco use: Secondary | ICD-10-CM

## 2014-10-03 DIAGNOSIS — T426X5A Adverse effect of other antiepileptic and sedative-hypnotic drugs, initial encounter: Secondary | ICD-10-CM | POA: Diagnosis present

## 2014-10-03 DIAGNOSIS — K5900 Constipation, unspecified: Secondary | ICD-10-CM | POA: Diagnosis present

## 2014-10-03 DIAGNOSIS — N390 Urinary tract infection, site not specified: Secondary | ICD-10-CM | POA: Insufficient documentation

## 2014-10-03 DIAGNOSIS — I951 Orthostatic hypotension: Secondary | ICD-10-CM | POA: Diagnosis present

## 2014-10-03 DIAGNOSIS — R55 Syncope and collapse: Secondary | ICD-10-CM | POA: Diagnosis not present

## 2014-10-03 DIAGNOSIS — Z79899 Other long term (current) drug therapy: Secondary | ICD-10-CM

## 2014-10-03 DIAGNOSIS — Z87442 Personal history of urinary calculi: Secondary | ICD-10-CM | POA: Insufficient documentation

## 2014-10-03 DIAGNOSIS — H6121 Impacted cerumen, right ear: Secondary | ICD-10-CM | POA: Diagnosis present

## 2014-10-03 DIAGNOSIS — Y9389 Activity, other specified: Secondary | ICD-10-CM | POA: Insufficient documentation

## 2014-10-03 DIAGNOSIS — E039 Hypothyroidism, unspecified: Secondary | ICD-10-CM | POA: Diagnosis present

## 2014-10-03 DIAGNOSIS — E119 Type 2 diabetes mellitus without complications: Secondary | ICD-10-CM | POA: Insufficient documentation

## 2014-10-03 DIAGNOSIS — G251 Drug-induced tremor: Secondary | ICD-10-CM | POA: Diagnosis present

## 2014-10-03 DIAGNOSIS — E780 Pure hypercholesterolemia: Secondary | ICD-10-CM | POA: Insufficient documentation

## 2014-10-03 DIAGNOSIS — Z880 Allergy status to penicillin: Secondary | ICD-10-CM | POA: Insufficient documentation

## 2014-10-03 DIAGNOSIS — D649 Anemia, unspecified: Secondary | ICD-10-CM | POA: Diagnosis present

## 2014-10-03 DIAGNOSIS — Z888 Allergy status to other drugs, medicaments and biological substances status: Secondary | ICD-10-CM

## 2014-10-03 DIAGNOSIS — I6523 Occlusion and stenosis of bilateral carotid arteries: Secondary | ICD-10-CM | POA: Diagnosis present

## 2014-10-03 DIAGNOSIS — N183 Chronic kidney disease, stage 3 (moderate): Secondary | ICD-10-CM | POA: Insufficient documentation

## 2014-10-03 DIAGNOSIS — Z885 Allergy status to narcotic agent status: Secondary | ICD-10-CM

## 2014-10-03 DIAGNOSIS — Z043 Encounter for examination and observation following other accident: Secondary | ICD-10-CM | POA: Insufficient documentation

## 2014-10-03 DIAGNOSIS — J449 Chronic obstructive pulmonary disease, unspecified: Secondary | ICD-10-CM | POA: Diagnosis present

## 2014-10-03 DIAGNOSIS — I440 Atrioventricular block, first degree: Secondary | ICD-10-CM | POA: Diagnosis present

## 2014-10-03 DIAGNOSIS — R2243 Localized swelling, mass and lump, lower limb, bilateral: Secondary | ICD-10-CM | POA: Insufficient documentation

## 2014-10-03 DIAGNOSIS — Z794 Long term (current) use of insulin: Secondary | ICD-10-CM

## 2014-10-03 DIAGNOSIS — R109 Unspecified abdominal pain: Secondary | ICD-10-CM

## 2014-10-03 DIAGNOSIS — B952 Enterococcus as the cause of diseases classified elsewhere: Secondary | ICD-10-CM | POA: Diagnosis present

## 2014-10-03 DIAGNOSIS — Z993 Dependence on wheelchair: Secondary | ICD-10-CM

## 2014-10-03 DIAGNOSIS — Z8673 Personal history of transient ischemic attack (TIA), and cerebral infarction without residual deficits: Secondary | ICD-10-CM

## 2014-10-03 DIAGNOSIS — R1032 Left lower quadrant pain: Secondary | ICD-10-CM | POA: Insufficient documentation

## 2014-10-03 DIAGNOSIS — H3530 Unspecified macular degeneration: Secondary | ICD-10-CM | POA: Diagnosis present

## 2014-10-03 DIAGNOSIS — R001 Bradycardia, unspecified: Secondary | ICD-10-CM | POA: Diagnosis present

## 2014-10-03 DIAGNOSIS — Y998 Other external cause status: Secondary | ICD-10-CM

## 2014-10-03 DIAGNOSIS — Z833 Family history of diabetes mellitus: Secondary | ICD-10-CM

## 2014-10-03 DIAGNOSIS — I739 Peripheral vascular disease, unspecified: Secondary | ICD-10-CM | POA: Diagnosis present

## 2014-10-03 DIAGNOSIS — Z959 Presence of cardiac and vascular implant and graft, unspecified: Secondary | ICD-10-CM

## 2014-10-03 DIAGNOSIS — F17210 Nicotine dependence, cigarettes, uncomplicated: Secondary | ICD-10-CM | POA: Diagnosis present

## 2014-10-03 LAB — CBC WITH DIFFERENTIAL/PLATELET
BASOS PCT: 0 % (ref 0–1)
Basophils Absolute: 0 10*3/uL (ref 0.0–0.1)
Eosinophils Absolute: 0.2 10*3/uL (ref 0.0–0.7)
Eosinophils Relative: 3 % (ref 0–5)
HCT: 38 % (ref 36.0–46.0)
Hemoglobin: 11.7 g/dL — ABNORMAL LOW (ref 12.0–15.0)
LYMPHS ABS: 2.6 10*3/uL (ref 0.7–4.0)
LYMPHS PCT: 33 % (ref 12–46)
MCH: 28.4 pg (ref 26.0–34.0)
MCHC: 30.8 g/dL (ref 30.0–36.0)
MCV: 92.2 fL (ref 78.0–100.0)
MONOS PCT: 12 % (ref 3–12)
Monocytes Absolute: 1 10*3/uL (ref 0.1–1.0)
NEUTROS PCT: 52 % (ref 43–77)
Neutro Abs: 4.2 10*3/uL (ref 1.7–7.7)
Platelets: 194 10*3/uL (ref 150–400)
RBC: 4.12 MIL/uL (ref 3.87–5.11)
RDW: 14.2 % (ref 11.5–15.5)
WBC: 7.9 10*3/uL (ref 4.0–10.5)

## 2014-10-03 LAB — BRAIN NATRIURETIC PEPTIDE: B Natriuretic Peptide: 323.1 pg/mL — ABNORMAL HIGH (ref 0.0–100.0)

## 2014-10-03 LAB — COMPREHENSIVE METABOLIC PANEL
ALT: 14 U/L (ref 14–54)
AST: 20 U/L (ref 15–41)
Albumin: 3.5 g/dL (ref 3.5–5.0)
Alkaline Phosphatase: 62 U/L (ref 38–126)
Anion gap: 8 (ref 5–15)
BUN: 35 mg/dL — ABNORMAL HIGH (ref 6–20)
CALCIUM: 9.5 mg/dL (ref 8.9–10.3)
CO2: 27 mmol/L (ref 22–32)
CREATININE: 1.81 mg/dL — AB (ref 0.44–1.00)
Chloride: 105 mmol/L (ref 101–111)
GFR calc non Af Amer: 26 mL/min — ABNORMAL LOW (ref >60)
GFR, EST AFRICAN AMERICAN: 30 mL/min — AB (ref >60)
GLUCOSE: 105 mg/dL — AB (ref 65–99)
Potassium: 4.8 mmol/L (ref 3.5–5.1)
SODIUM: 140 mmol/L (ref 135–145)
TOTAL PROTEIN: 7.3 g/dL (ref 6.5–8.1)
Total Bilirubin: 0.3 mg/dL (ref 0.3–1.2)

## 2014-10-03 LAB — URINALYSIS, ROUTINE W REFLEX MICROSCOPIC
BILIRUBIN URINE: NEGATIVE
Glucose, UA: NEGATIVE mg/dL
Hgb urine dipstick: NEGATIVE
Ketones, ur: NEGATIVE mg/dL
Nitrite: NEGATIVE
PH: 5 (ref 5.0–8.0)
Protein, ur: 30 mg/dL — AB
SPECIFIC GRAVITY, URINE: 1.008 (ref 1.005–1.030)
UROBILINOGEN UA: 0.2 mg/dL (ref 0.0–1.0)

## 2014-10-03 LAB — TROPONIN I: Troponin I: <0.03 ng/mL (ref <0.031)

## 2014-10-03 LAB — URINE MICROSCOPIC-ADD ON

## 2014-10-03 LAB — I-STAT CG4 LACTIC ACID, ED: Lactic Acid, Venous: 0.92 mmol/L (ref 0.5–2.0)

## 2014-10-03 LAB — CBG MONITORING, ED: Glucose-Capillary: 90 mg/dL (ref 65–99)

## 2014-10-03 MED ORDER — ONDANSETRON HCL 4 MG/2ML IJ SOLN
4.0000 mg | Freq: Once | INTRAMUSCULAR | Status: AC
  Administered 2014-10-03: 4 mg via INTRAVENOUS
  Filled 2014-10-03: qty 2

## 2014-10-03 MED ORDER — CEPHALEXIN 500 MG PO CAPS: 500.0000 mg | ORAL_CAPSULE | Freq: Four times a day (QID) | ORAL | Status: DC

## 2014-10-03 MED ORDER — SODIUM CHLORIDE 0.9 % IV BOLUS (SEPSIS)
500.0000 mL | Freq: Once | INTRAVENOUS | Status: AC
  Administered 2014-10-03: 500 mL via INTRAVENOUS

## 2014-10-03 MED ORDER — IOHEXOL 300 MG/ML  SOLN
25.0000 mL | INTRAMUSCULAR | Status: AC
  Administered 2014-10-03: 25 mL via ORAL

## 2014-10-03 MED ORDER — HYDROCODONE-ACETAMINOPHEN 5-325 MG PO TABS
1.0000 | ORAL_TABLET | Freq: Once | ORAL | Status: AC
Start: 2014-10-03 — End: 2014-10-03
  Administered 2014-10-03: 1 via ORAL
  Filled 2014-10-03: qty 1

## 2014-10-03 NOTE — ED Notes (Signed)
Called [facility] for transport to [facility]

## 2014-10-03 NOTE — ED Notes (Signed)
Pt here from [facility] , pt fell while trying to get into bed , no complaints from fall, pt only complaint is general weakness

## 2014-10-03 NOTE — ED Notes (Signed)
Patient transported to x-ray. ?

## 2014-10-03 NOTE — ED Notes (Signed)
Pt was picked up and transported back by [facility].  Report and papers were given.  Unable to reach [facility] (called # on their website)

## 2014-10-03 NOTE — ED Provider Notes (Signed)
CSN: [id number]     Arrival date & time 10/03/14  1048 History   First MD Initiated Contact with Patient 10/03/14 1049     Chief Complaint  Patient presents with  . Fall  . Fatigue     (Consider location/radiation/quality/duration/timing/severity/associated sxs/prior Treatment) Patient is a 80 y.o. female presenting with fall. The history is provided by the patient.  Fall    Pt with hx HTN, COPD, DM, CKD, TIA p/w generalized weakness that began today.  She came in after she attempted to go back to bed and was too weak to get into the bed and slid down to the floor.  States she was doing well until she woke up this morning and felt generally weak.  Feels more winded than usual.  Has had dysuria for months, constipation x 1 month, has also had some bilateral leg swelling chronically.   States she has chronically had right leg weakness that is unchanged, she doesn't know why she has this - does not require assistance with walking.  Had a boot on her left foot for a broken foot and chronic wound that she reports she took off today.  Denies fevers, CP, cough, recent illness, abdominal pain, N/V/D.  States she is eating well.    Past Medical History  Diagnosis Date  . Hypertension   . Macular degeneration, bilateral   . Type II diabetes mellitus   . COPD (chronic obstructive pulmonary disease)   . Diabetic peripheral neuropathy   . Hypercholesteremia   . PAD (peripheral artery disease)     a. 1997 s/p R fem-pop bypass;  b. 03/1999 R CIA stenting.  . CKD (chronic kidney disease), stage III   . Headache(784.0)   . Hypothyroid   . Non-obstructive CAD     a. 11/2005 Cath: LM nl, LAD 30 diffuse, LCX small, 20p, 36m/d, 75d into OM, RCA dominant, 40p, 30-40 diffuse, EF 55%.  Marland Kitchen TIA (transient ischemic attack)     a. 09/2013.  . H/O echocardiogram     a. 09/2013 Echo: nl LV fxn, no rwma, mildly to mod dil LA, mildly dil RA.  . Carotid arterial disease     a. 09/2013 Carotid U/S: 40-59% bilat ICA  stenosis.  . Enterococcus UTI     a. 09/2013.  . Tobacco abuse   . Cholelithiasis   . Pyelonephritis    Past Surgical History  Procedure Laterality Date  . Tonsillectomy  1940's  . Abdominal hysterectomy  1970's  . Dilation and curettage of uterus  1970's    "probably 2" (11/01/2012)  . Cataract extraction w/ intraocular lens  implant, bilateral Bilateral ~ 2010  . Bypass graft Right ~ 1997    RLE by Dr. Gwenlyn Perking 11/29/2005 (11/01/2012)  . Cystostomy w/ bladder biopsy  2005    Archie Endo 09/19/2003 (11/01/2012)  . Anterior cervical decomp/discectomy fusion  01/02/2006    Archie Endo 01/02/2006 (11/01/2012)  . Shoulder open rotator cuff repair Bilateral 1990's-2000's    "3X on the left; twice on the right" (11/01/2012)  . Cardiac catheterization  11/29/2005    Archie Endo 11/29/2005 (11/01/2012)  . Esophagogastroduodenoscopy N/A 10/14/2013    Procedure: ESOPHAGOGASTRODUODENOSCOPY (EGD);  Surgeon: Missy Sabins, MD;  Location: [facility];  Service: Endoscopy;  Laterality: N/A;   History reviewed. No pertinent family history. History  Substance Use Topics  . Smoking status: Current Every Day Smoker -- 0.12 packs/day for 60 years    Types: Cigarettes  . Smokeless tobacco: Never Used     Comment:  states she smokes 4 cigs per day  . Alcohol Use: No   OB History    No data available     Review of Systems  All other systems reviewed and are negative.     Allergies  Ace inhibitors; Codeine; Lisinopril; Penicillins; Trimethoprim; Septra; and Sulfur  Home Medications   Prior to Admission medications   Medication Sig Start Date End Date Taking? Authorizing Provider  acetaminophen (TYLENOL) 500 MG tablet Take 500 mg by mouth 3 (three) times daily. 8am 12pm, 4pm    Historical Provider, MD  albuterol (PROVENTIL) (2.5 MG/3ML) 0.083% nebulizer solution Take 2.5 mg by nebulization every 6 (six) hours as needed for wheezing.    Historical Provider, MD  ALPRAZolam Duanne Moron) 0.25 MG tablet Take 1 tablet (0.25  mg total) by mouth 2 (two) times daily as needed for anxiety. 08/13/14   Estill Dooms, MD  alum & mag hydroxide-simeth (MAALOX/MYLANTA) 200-200-20 MG/5ML suspension Take 30 mLs by mouth every 6 (six) hours as needed for indigestion or heartburn. Do not exceed 4 doses in 24 hours    Historical Provider, MD  amitriptyline (ELAVIL) 50 MG tablet Take 50 mg by mouth at bedtime.    Historical Provider, MD  amLODipine (NORVASC) 10 MG tablet Take 10 mg by mouth at bedtime.     Historical Provider, MD  aspirin 81 MG chewable tablet Chew 81 mg by mouth daily.    Historical Provider, MD  B Complex-C (B-COMPLEX WITH VITAMIN C) tablet Take 1 tablet by mouth daily.    Historical Provider, MD  bisacodyl (DULCOLAX) 10 MG suppository Place 10 mg rectally as needed for moderate constipation.    Historical Provider, MD  carvedilol (COREG) 25 MG tablet Take 25 mg by mouth 2 (two) times daily with a meal. 0800 and 1700    Historical Provider, MD  cloNIDine (CATAPRES) 0.2 MG tablet Take 0.2 mg by mouth 2 (two) times daily. Hold if blood pressure less than 100/60    Historical Provider, MD  Cranberry 500 MG CAPS Take 1,000 mg by mouth 2 (two) times daily.    Historical Provider, MD  docusate sodium (COLACE) 100 MG capsule Take 1 capsule (100 mg total) by mouth every 12 (twelve) hours. Patient not taking: Reported on 08/09/2014 08/07/14   Kristen N Ward, DO  escitalopram (LEXAPRO) 20 MG tablet Take 20 mg by mouth daily.    Historical Provider, MD  feeding supplement, GLUCERNA SHAKE, (GLUCERNA SHAKE) LIQD Take 237 mLs by mouth 2 (two) times daily between meals. 08/13/14   Reyne Dumas, MD  furosemide (LASIX) 40 MG tablet Take 1 tablet (40 mg total) by mouth daily. 8am 2pm 08/13/14   Reyne Dumas, MD  gabapentin (NEURONTIN) 300 MG capsule Take 1 capsule (300 mg total) by mouth 2 (two) times daily. 08/13/14   Reyne Dumas, MD  guaiFENesin (ROBITUSSIN) 100 MG/5ML liquid Take 100 mg by mouth every 6 (six) hours as needed for cough.  Not to exceed 4 doses in 24 hours    Historical Provider, MD  HYDROcodone-acetaminophen (NORCO/VICODIN) 5-325 MG per tablet 1 by mouth every 6 hours as needed for moderate pain, 2 by mouth every 6 hours as needed for sever pain DO NOT EXCEED 4 GM OF APAP IN 24 HOURS 08/13/14   Estill Dooms, MD  insulin glargine (LANTUS) 100 UNIT/ML injection Inject 0.4 mLs (40 Units total) into the skin daily. 08/13/14   Reyne Dumas, MD  insulin lispro (HUMALOG) 100 UNIT/ML injection Inject 0-22 Units into  the skin 3 (three) times daily with meals. CBG 0-90 0 units, 91-150 6 units, 151-200 10 units, 201-250 12 units, 251-300 14 units, 301-350 16 units, 351-400 18 units, 401-450 22 units, 451 or more 22 units and call md    Historical Provider, MD  levothyroxine (SYNTHROID, LEVOTHROID) 50 MCG tablet Take 50 mcg by mouth daily before breakfast.     Historical Provider, MD  loperamide (IMODIUM) 2 MG capsule Take 2 mg by mouth as needed for diarrhea or loose stools. Do not exceed 8 doses in 24 hours    Historical Provider, MD  loratadine (CLARITIN) 10 MG tablet Take 10 mg by mouth daily.    Historical Provider, MD  Menthol-Zinc Oxide (RISAMINE) 0.44-20.625 % OINT Apply 1 application topically as needed (apply with each incontinence change).     Historical Provider, MD  Multiple Vitamins-Minerals (PRESERVISION AREDS) CAPS Take 1 capsule by mouth daily.    Historical Provider, MD  ondansetron (ZOFRAN ODT) 4 MG disintegrating tablet Take 1 tablet (4 mg total) by mouth every 8 (eight) hours as needed for nausea or vomiting. 08/07/14   Kristen N Ward, DO  pantoprazole (PROTONIX) 40 MG tablet Take 1 tablet (40 mg total) by mouth daily. 08/13/14   Reyne Dumas, MD  Polyethyl Glycol-Propyl Glycol 0.4-0.3 % SOLN Place 1 drop into both eyes 3 (three) times daily. Systane PF    Historical Provider, MD  polyethylene glycol (MIRALAX / GLYCOLAX) packet Take 17 g by mouth daily. Mix in 8 oz liquid and drink    Historical Provider, MD   rosuvastatin (CRESTOR) 20 MG tablet Take 20 mg by mouth every evening. 4pm    Historical Provider, MD  senna-docusate (SENOKOT-S) 8.6-50 MG per tablet Take 2 tablets by mouth 2 (two) times daily.     Historical Provider, MD  Skin Protectants, Misc. (MINERIN) CREA Apply 1 application topically 2 (two) times daily. Apply to both lower extremities and feet twice daily    Historical Provider, MD  Vitamin D, Ergocalciferol, (DRISDOL) 50000 UNITS CAPS capsule Take 50,000 Units by mouth every 7 (seven) days. On Saturdays    Historical Provider, MD   BP 134/47 mmHg  Pulse 53  Temp(Src) 97.7 F (36.5 C) (Oral)  Resp 13  SpO2 100% Physical Exam  Constitutional: She appears well-developed and well-nourished. No distress.  HENT:  Head: Normocephalic and atraumatic.  Eyes: Conjunctivae are normal.  Pt has macular degeneration.  At two feet holding up two fingers, pt identifies two fingers.  At 4 feet holding up four fingers, pt sees 3 fingers.   Neck: Neck supple.  Cardiovascular: Normal rate and regular rhythm.   Pulmonary/Chest: Effort normal and breath sounds normal. No respiratory distress. She has no wheezes. She has no rales.  Abdominal: Soft. She exhibits no distension. There is tenderness in the left lower quadrant. There is no rebound and no guarding.  Musculoskeletal: She exhibits no edema or tenderness.  Neurological: She is alert.  CN II-XII intact, EOMs intact but pt does not look far laterally in any direction, no pronator drift, grip strengths equal bilaterally; strength 5/5 in all extremities with exception of RLE which is generally weaker, 3-4/5 throughout, sensation intact in all extremities; finger to nose abnormal, past pointing, though pt has poor vision, heel to shin using left leg unable to perform with right leg, rapid alternating movements normal;   Skin: She is not diaphoretic.  Left foot with scab over lateral heel.  No erythema, edema, warmth, discharge, or tenderness  Psychiatric: She has a normal mood and affect. Her behavior is normal.  Nursing note and vitals reviewed.   ED Course  Procedures (including critical care time) Labs Review Labs Reviewed  COMPREHENSIVE METABOLIC PANEL - Abnormal; Notable for the following:    Glucose, Bld 105 (*)    BUN 35 (*)    Creatinine, Ser 1.81 (*)    GFR calc non Af Amer 26 (*)    GFR calc Af Amer 30 (*)    All other components within normal limits  CBC WITH DIFFERENTIAL/PLATELET - Abnormal; Notable for the following:    Hemoglobin 11.7 (*)    All other components within normal limits  URINALYSIS, ROUTINE W REFLEX MICROSCOPIC - Abnormal; Notable for the following:    APPearance HAZY (*)    Protein, ur 30 (*)    Leukocytes, UA SMALL (*)    All other components within normal limits  BRAIN NATRIURETIC PEPTIDE - Abnormal; Notable for the following:    B Natriuretic Peptide 323.1 (*)    All other components within normal limits  URINE MICROSCOPIC-ADD ON - Abnormal; Notable for the following:    Squamous Epithelial / LPF FEW (*)    Bacteria, UA FEW (*)    Casts HYALINE CASTS (*)    All other components within normal limits  URINE CULTURE  TROPONIN I  CBG MONITORING, ED  I-STAT CG4 LACTIC ACID, ED    Imaging Review Ct Abdomen Pelvis Wo Contrast  10/03/2014   CLINICAL DATA:  Patient fell wall trying to get into bend today. No complaints after falling. Complete is generalized weakness. History of hypertension, diabetes, TIA, CAD, hysterectomy, chronic kidney disease, cardiac catheterization.  EXAM: CT ABDOMEN AND PELVIS WITHOUT CONTRAST  TECHNIQUE: Multidetector CT imaging of the abdomen and pelvis was performed following the standard protocol without IV contrast.  COMPARISON:  08/10/2014 ultrasound and 10/12/2013 CT  FINDINGS: Lower chest: There is a calcified granuloma at the left lung base. Coronary artery calcifications are present. No pericardial effusion. Heart size is normal.  Upper abdomen: No focal  abnormality within the liver, spleen, pancreas, or adrenal glands. There is dense calcification of the renal arteries bilaterally. No definite renal masses. No hydronephrosis. No ureteral stones. The gallbladder is present. Possible layering small stones or dense sludge.  Gastrointestinal tract: The stomach and small bowel loops are normal in appearance. The appendix is well seen and has a normal appearance. Colonic loops are normal in wall thickness and caliber. There is moderate stool burden especially involving the right colon.  Pelvis: The uterus is absent. There is no free pelvic fluid. No pelvic adenopathy. No adnexal mass.  Retroperitoneum: There is dense calcification of the aorta. Right iliac artery stent. No retroperitoneal or mesenteric adenopathy.  Abdominal wall: Unremarkable.  Osseous structures: There is significant spondylosis of the lower thoracic and lumbar spine. No suspicious lytic or blastic lesions are identified. Chronic deformity of the right ischial tuberosity. No acute fractures identified.  IMPRESSION: 1. No evidence for acute injury of the abdomen or pelvis. 2. Coronary artery disease. 3. Suspect small layering stones or dense sludge within the gallbladder. 4. Moderate stool burden. 5. Status post hysterectomy. 6. Old deformity of the right ischial tuberosity.   Electronically Signed   By: Nolon Nations M.D.   On: 10/03/2014 15:02   Dg Chest 2 View  10/03/2014   CLINICAL DATA:  Shortness of breath.  The patient fell today.  COPD.  EXAM: CHEST  2 VIEW  COMPARISON:  08/09/2014  and 11/22/2013  FINDINGS: The heart size and pulmonary vascularity are normal and the lungs are clear. There is calcification in the thoracic aorta. No acute osseous abnormality. Lower cervical fusion. Previous right shoulder surgery.  IMPRESSION: No acute abnormalities.   Electronically Signed   By: Lorriane Shire M.D.   On: 10/03/2014 11:51   Ct Head Wo Contrast  10/03/2014   CLINICAL DATA:  The patient  fell trying to get into bed today. Fatigue. Right leg weakness.  EXAM: CT HEAD WITHOUT CONTRAST  TECHNIQUE: Contiguous axial images were obtained from the base of the skull through the vertex without intravenous contrast.  COMPARISON:  03/17/2014  FINDINGS: There is diffuse cerebral cortical atrophy with secondary ventricular dilatation. The atrophy is most severe in the temporal lobes. Extensive chronic periventricular white matter lucency consistent with chronic small vessel ischemic disease, stable. There is no acute intracranial hemorrhage, acute infarction, or intracranial mass lesion. Osseous structures are normal.  IMPRESSION: No acute abnormalities. Atrophy and chronic small vessel ischemic disease.   Electronically Signed   By: Lorriane Shire M.D.   On: 10/03/2014 14:48     EKG Interpretation   Date/Time:  Friday Oct 03 2014 11:04:12 EDT Ventricular Rate:  51 PR Interval:    QRS Duration: 135 QT Interval:  503 QTC Calculation: 463 R Axis:   -20 Text Interpretation:  Sinus rhythm with 1st degree block Ventricular  premature complex IVCD, consider atypical RBBB Probable lateral infarct,  age indeterminate Confirmed by Mingo Amber  MD, BLAIR ([id number]) on 10/03/2014  11:23:44 AM      11:33 AM Discussed pt with Dr Mingo Amber who will also see the patient.   3:18 PM Pt reports she is feeling a little better.  She is hungry.  Would like to eat and would like to go home.  Will give food/drink, ambulate.    3:39 PM Pt unable to ambulate secondary to pain in her left foot.  This is an ongoing/chronic problem with no recent change.  Pt denies new injury.  Examination of the foot reveals chronic wound without e/o infection.  I checked her MAR and she has PRN norco she takes at home for her pain.  Will provide this for the patient.  Meal also ordered.  Pt otherwise states she is feeling better.  Anticipate d/c home.   MDM   Final diagnoses:  Generalized weakness    Afebrile, nontoxic patient with  generalized weakness.  Workup unremarkable.  Pt reports feeling better while in ED.  Pt also seen and examined by Dr Mingo Amber.  Discussed all results with Dr Mingo Amber.  Plan for d/c back to facility with PCP follow up.  Discussed result, findings, treatment, and follow up  with patient.  Pt given return precautions.  Pt verbalizes understanding and agrees with plan.         Clayton Bibles, PA-C 10/03/14 [place], MD 10/06/14 [phone]

## 2014-10-03 NOTE — Discharge Instructions (Signed)
Read the information below.  You may return to the Emergency Department at any time for worsening condition or any new symptoms that concern you.   Weakness Weakness is a lack of strength. It may be felt all over the body (generalized) or in one specific part of the body (focal). Some causes of weakness can be serious. You may need further medical evaluation, especially if you are elderly or you have a history of immunosuppression (such as chemotherapy or HIV), kidney disease, heart disease, or diabetes. CAUSES  Weakness can be caused by many different things, including:  Infection.  Physical exhaustion.  Internal bleeding or other blood loss that results in a lack of red blood cells (anemia).  Dehydration. This cause is more common in elderly people.  Side effects or electrolyte abnormalities from medicines, such as pain medicines or sedatives.  Emotional distress, anxiety, or depression.  Circulation problems, especially severe peripheral arterial disease.  Heart disease, such as rapid atrial fibrillation, bradycardia, or heart failure.  Nervous system disorders, such as Guillain-Barr syndrome, multiple sclerosis, or stroke. DIAGNOSIS  To find the cause of your weakness, your caregiver will take your history and perform a physical exam. Lab tests or X-rays may also be ordered, if needed. TREATMENT  Treatment of weakness depends on the cause of your symptoms and can vary greatly. HOME CARE INSTRUCTIONS   Rest as needed.  Eat a well-balanced diet.  Try to get some exercise every day.  Only take over-the-counter or prescription medicines as directed by your caregiver. SEEK MEDICAL CARE IF:   Your weakness seems to be getting worse or spreads to other parts of your body.  You develop new aches or pains. SEEK IMMEDIATE MEDICAL CARE IF:   You cannot perform your normal daily activities, such as getting dressed and feeding yourself.  You cannot walk up and down stairs, or  you feel exhausted when you do so.  You have shortness of breath or chest pain.  You have difficulty moving parts of your body.  You have weakness in only one area of the body or on only one side of the body.  You have a fever.  You have trouble speaking or swallowing.  You cannot control your bladder or bowel movements.  You have black or bloody vomit or stools. MAKE SURE YOU:  Understand these instructions.  Will watch your condition.  Will get help right away if you are not doing well or get worse. Document Released: 05/02/2005 Document Revised: 11/01/2011 Document Reviewed: 07/01/2011 [facility] Patient Information 2015 [place], [place]. This information is not intended to replace advice given to you by your health care provider. Make sure you discuss any questions you have with your health care provider. Urinary Tract Infection Urinary tract infections (UTIs) can develop anywhere along your urinary tract. Your urinary tract is your body's drainage system for removing wastes and extra water. Your urinary tract includes two kidneys, two ureters, a bladder, and a urethra. Your kidneys are a pair of bean-shaped organs. Each kidney is about the size of your fist. They are located below your ribs, one on each side of your spine. CAUSES Infections are caused by microbes, which are microscopic organisms, including fungi, viruses, and bacteria. These organisms are so small that they can only be seen through a microscope. Bacteria are the microbes that most commonly cause UTIs. SYMPTOMS  Symptoms of UTIs may vary by age and gender of the patient and by the location of the infection. Symptoms in young women  typically include a frequent and intense urge to urinate and a painful, burning feeling in the bladder or urethra during urination. Older women and men are more likely to be tired, shaky, and weak and have muscle aches and abdominal pain. A fever may mean the infection is in your kidneys.  Other symptoms of a kidney infection include pain in your back or sides below the ribs, nausea, and vomiting. DIAGNOSIS To diagnose a UTI, your caregiver will ask you about your symptoms. Your caregiver also will ask to provide a urine sample. The urine sample will be tested for bacteria and white blood cells. White blood cells are made by your body to help fight infection. TREATMENT  Typically, UTIs can be treated with medication. Because most UTIs are caused by a bacterial infection, they usually can be treated with the use of antibiotics. The choice of antibiotic and length of treatment depend on your symptoms and the type of bacteria causing your infection. HOME CARE INSTRUCTIONS  If you were prescribed antibiotics, take them exactly as your caregiver instructs you. Finish the medication even if you feel better after you have only taken some of the medication.  Drink enough water and fluids to keep your urine clear or pale yellow.  Avoid caffeine, tea, and carbonated beverages. They tend to irritate your bladder.  Empty your bladder often. Avoid holding urine for long periods of time.  Empty your bladder before and after sexual intercourse.  After a bowel movement, women should cleanse from front to back. Use each tissue only once. SEEK MEDICAL CARE IF:   You have back pain.  You develop a fever.  Your symptoms do not begin to resolve within 3 days. SEEK IMMEDIATE MEDICAL CARE IF:   You have severe back pain or lower abdominal pain.  You develop chills.  You have nausea or vomiting.  You have continued burning or discomfort with urination. MAKE SURE YOU:   Understand these instructions.  Will watch your condition.  Will get help right away if you are not doing well or get worse. Document Released: 02/09/2005 Document Revised: 11/01/2011 Document Reviewed: 06/10/2011 [facility] Patient Information 2015 [place], [place]. This information is not intended to replace advice  given to you by your health care provider. Make sure you discuss any questions you have with your health care provider.

## 2014-10-03 NOTE — ED Notes (Signed)
CBG 235 

## 2014-10-03 NOTE — ED Notes (Addendum)
Pt was unable to ambulate due to pain in her foot.  She made it too the edge of the bed and had to side back down.

## 2014-10-03 NOTE — ED Notes (Signed)
PA notified that pt unable to tolerate oral contrast.

## 2014-10-03 NOTE — ED Notes (Signed)
Ordered food tray - low sugar/salt (tea)

## 2014-10-04 LAB — URINE CULTURE: Colony Count: >=100000

## 2014-10-05 ENCOUNTER — Inpatient Hospital Stay (HOSPITAL_COMMUNITY): Payer: Medicare HMO

## 2014-10-05 ENCOUNTER — Inpatient Hospital Stay (HOSPITAL_COMMUNITY)
Admission: EM | Admit: 2014-10-05 | Discharge: 2014-10-08 | DRG: 683 | Disposition: A | Discharge status: Nursing Home (Includes ICF) | Payer: Medicare HMO | Attending: Internal Medicine | Admitting: Internal Medicine

## 2014-10-05 ENCOUNTER — Encounter (HOSPITAL_COMMUNITY): Payer: Self-pay | Admitting: General Practice

## 2014-10-05 DIAGNOSIS — E780 Pure hypercholesterolemia: Secondary | ICD-10-CM | POA: Diagnosis present

## 2014-10-05 DIAGNOSIS — F17210 Nicotine dependence, cigarettes, uncomplicated: Secondary | ICD-10-CM | POA: Diagnosis present

## 2014-10-05 DIAGNOSIS — Z882 Allergy status to sulfonamides status: Secondary | ICD-10-CM | POA: Diagnosis not present

## 2014-10-05 DIAGNOSIS — R42 Dizziness and giddiness: Secondary | ICD-10-CM

## 2014-10-05 DIAGNOSIS — H6121 Impacted cerumen, right ear: Secondary | ICD-10-CM | POA: Diagnosis present

## 2014-10-05 DIAGNOSIS — R1013 Epigastric pain: Secondary | ICD-10-CM | POA: Diagnosis not present

## 2014-10-05 DIAGNOSIS — I951 Orthostatic hypotension: Secondary | ICD-10-CM | POA: Diagnosis present

## 2014-10-05 DIAGNOSIS — R251 Tremor, unspecified: Secondary | ICD-10-CM | POA: Diagnosis present

## 2014-10-05 DIAGNOSIS — R55 Syncope and collapse: Secondary | ICD-10-CM | POA: Diagnosis present

## 2014-10-05 DIAGNOSIS — R001 Bradycardia, unspecified: Secondary | ICD-10-CM | POA: Diagnosis present

## 2014-10-05 DIAGNOSIS — E119 Type 2 diabetes mellitus without complications: Secondary | ICD-10-CM | POA: Diagnosis not present

## 2014-10-05 DIAGNOSIS — N189 Chronic kidney disease, unspecified: Secondary | ICD-10-CM

## 2014-10-05 DIAGNOSIS — I6523 Occlusion and stenosis of bilateral carotid arteries: Secondary | ICD-10-CM | POA: Diagnosis present

## 2014-10-05 DIAGNOSIS — E039 Hypothyroidism, unspecified: Secondary | ICD-10-CM | POA: Diagnosis present

## 2014-10-05 DIAGNOSIS — Z885 Allergy status to narcotic agent status: Secondary | ICD-10-CM | POA: Diagnosis not present

## 2014-10-05 DIAGNOSIS — D649 Anemia, unspecified: Secondary | ICD-10-CM | POA: Diagnosis present

## 2014-10-05 DIAGNOSIS — I129 Hypertensive chronic kidney disease with stage 1 through stage 4 chronic kidney disease, or unspecified chronic kidney disease: Secondary | ICD-10-CM | POA: Diagnosis present

## 2014-10-05 DIAGNOSIS — H6120 Impacted cerumen, unspecified ear: Secondary | ICD-10-CM | POA: Diagnosis present

## 2014-10-05 DIAGNOSIS — Z833 Family history of diabetes mellitus: Secondary | ICD-10-CM | POA: Diagnosis not present

## 2014-10-05 DIAGNOSIS — Z8673 Personal history of transient ischemic attack (TIA), and cerebral infarction without residual deficits: Secondary | ICD-10-CM | POA: Diagnosis not present

## 2014-10-05 DIAGNOSIS — Z880 Allergy status to penicillin: Secondary | ICD-10-CM | POA: Diagnosis not present

## 2014-10-05 DIAGNOSIS — I739 Peripheral vascular disease, unspecified: Secondary | ICD-10-CM | POA: Diagnosis present

## 2014-10-05 DIAGNOSIS — Z993 Dependence on wheelchair: Secondary | ICD-10-CM | POA: Diagnosis not present

## 2014-10-05 DIAGNOSIS — N390 Urinary tract infection, site not specified: Secondary | ICD-10-CM | POA: Diagnosis present

## 2014-10-05 DIAGNOSIS — Z981 Arthrodesis status: Secondary | ICD-10-CM | POA: Diagnosis not present

## 2014-10-05 DIAGNOSIS — N179 Acute kidney failure, unspecified: Secondary | ICD-10-CM | POA: Diagnosis present

## 2014-10-05 DIAGNOSIS — Z7982 Long term (current) use of aspirin: Secondary | ICD-10-CM | POA: Diagnosis not present

## 2014-10-05 DIAGNOSIS — G251 Drug-induced tremor: Secondary | ICD-10-CM | POA: Diagnosis present

## 2014-10-05 DIAGNOSIS — N183 Chronic kidney disease, stage 3 (moderate): Secondary | ICD-10-CM | POA: Diagnosis present

## 2014-10-05 DIAGNOSIS — J449 Chronic obstructive pulmonary disease, unspecified: Secondary | ICD-10-CM | POA: Diagnosis present

## 2014-10-05 DIAGNOSIS — T426X5A Adverse effect of other antiepileptic and sedative-hypnotic drugs, initial encounter: Secondary | ICD-10-CM | POA: Diagnosis present

## 2014-10-05 DIAGNOSIS — E860 Dehydration: Secondary | ICD-10-CM | POA: Diagnosis present

## 2014-10-05 DIAGNOSIS — H3530 Unspecified macular degeneration: Secondary | ICD-10-CM | POA: Diagnosis present

## 2014-10-05 DIAGNOSIS — R109 Unspecified abdominal pain: Secondary | ICD-10-CM

## 2014-10-05 DIAGNOSIS — K5900 Constipation, unspecified: Secondary | ICD-10-CM | POA: Diagnosis present

## 2014-10-05 DIAGNOSIS — I440 Atrioventricular block, first degree: Secondary | ICD-10-CM | POA: Diagnosis present

## 2014-10-05 DIAGNOSIS — Z794 Long term (current) use of insulin: Secondary | ICD-10-CM | POA: Diagnosis not present

## 2014-10-05 DIAGNOSIS — B952 Enterococcus as the cause of diseases classified elsewhere: Secondary | ICD-10-CM | POA: Diagnosis present

## 2014-10-05 DIAGNOSIS — Z959 Presence of cardiac and vascular implant and graft, unspecified: Secondary | ICD-10-CM | POA: Diagnosis not present

## 2014-10-05 DIAGNOSIS — I2510 Atherosclerotic heart disease of native coronary artery without angina pectoris: Secondary | ICD-10-CM | POA: Diagnosis present

## 2014-10-05 DIAGNOSIS — Z888 Allergy status to other drugs, medicaments and biological substances status: Secondary | ICD-10-CM | POA: Diagnosis not present

## 2014-10-05 DIAGNOSIS — E1142 Type 2 diabetes mellitus with diabetic polyneuropathy: Secondary | ICD-10-CM | POA: Diagnosis present

## 2014-10-05 LAB — BASIC METABOLIC PANEL
ANION GAP: 11 (ref 5–15)
BUN: 45 mg/dL — AB (ref 6–20)
CO2: 25 mmol/L (ref 22–32)
Calcium: 9.4 mg/dL (ref 8.9–10.3)
Chloride: 103 mmol/L (ref 101–111)
Creatinine, Ser: 2.25 mg/dL — ABNORMAL HIGH (ref 0.44–1.00)
GFR calc Af Amer: 23 mL/min — ABNORMAL LOW (ref >60)
GFR calc non Af Amer: 20 mL/min — ABNORMAL LOW (ref >60)
Glucose, Bld: 116 mg/dL — ABNORMAL HIGH (ref 65–99)
Potassium: 4.8 mmol/L (ref 3.5–5.1)
Sodium: 139 mmol/L (ref 135–145)

## 2014-10-05 LAB — CBC WITH DIFFERENTIAL/PLATELET
BASOS ABS: 0 10*3/uL (ref 0.0–0.1)
BASOS PCT: 0 % (ref 0–1)
EOS ABS: 0.2 10*3/uL (ref 0.0–0.7)
Eosinophils Relative: 2 % (ref 0–5)
HCT: 33.9 % — ABNORMAL LOW (ref 36.0–46.0)
Hemoglobin: 10.5 g/dL — ABNORMAL LOW (ref 12.0–15.0)
Lymphocytes Relative: 31 % (ref 12–46)
Lymphs Abs: 2.5 10*3/uL (ref 0.7–4.0)
MCH: 28.8 pg (ref 26.0–34.0)
MCHC: 31 g/dL (ref 30.0–36.0)
MCV: 92.9 fL (ref 78.0–100.0)
Monocytes Absolute: 1.4 10*3/uL — ABNORMAL HIGH (ref 0.1–1.0)
Monocytes Relative: 17 % — ABNORMAL HIGH (ref 3–12)
Neutro Abs: 4.1 10*3/uL (ref 1.7–7.7)
Neutrophils Relative %: 50 % (ref 43–77)
PLATELETS: 215 10*3/uL (ref 150–400)
RBC: 3.65 MIL/uL — ABNORMAL LOW (ref 3.87–5.11)
RDW: 14.3 % (ref 11.5–15.5)
WBC: 8.2 10*3/uL (ref 4.0–10.5)

## 2014-10-05 LAB — GLUCOSE, CAPILLARY
GLUCOSE-CAPILLARY: 193 mg/dL — AB (ref 65–99)
Glucose-Capillary: 142 mg/dL — ABNORMAL HIGH (ref 65–99)

## 2014-10-05 LAB — URINALYSIS, ROUTINE W REFLEX MICROSCOPIC
Bilirubin Urine: NEGATIVE
Glucose, UA: NEGATIVE mg/dL
HGB URINE DIPSTICK: NEGATIVE
Ketones, ur: NEGATIVE mg/dL
Nitrite: NEGATIVE
PROTEIN: 30 mg/dL — AB
Specific Gravity, Urine: 1.015 (ref 1.005–1.030)
UROBILINOGEN UA: 0.2 mg/dL (ref 0.0–1.0)
pH: 5 (ref 5.0–8.0)

## 2014-10-05 LAB — URINE MICROSCOPIC-ADD ON

## 2014-10-05 LAB — I-STAT TROPONIN, ED: Troponin i, poc: 0.01 ng/mL (ref 0.00–0.08)

## 2014-10-05 LAB — T4, FREE: FREE T4: 0.87 ng/dL (ref 0.61–1.12)

## 2014-10-05 LAB — MRSA PCR SCREENING: MRSA by PCR: NEGATIVE

## 2014-10-05 LAB — TSH: TSH: 3.248 u[IU]/mL (ref 0.350–4.500)

## 2014-10-05 MED ORDER — HYDROCODONE-ACETAMINOPHEN 5-325 MG PO TABS: 1.0000 | ORAL_TABLET | ORAL | Status: DC | PRN

## 2014-10-05 MED ORDER — CARBAMIDE PEROXIDE 6.5 % OT SOLN
5.0000 [drp] | Freq: Two times a day (BID) | OTIC | Status: DC
  Administered 2014-10-05 – 2014-10-08 (×6): 5 [drp] via OTIC
  Filled 2014-10-05: qty 15

## 2014-10-05 MED ORDER — PANTOPRAZOLE SODIUM 40 MG PO TBEC
40.0000 mg | DELAYED_RELEASE_TABLET | Freq: Every day | ORAL | Status: DC
  Administered 2014-10-06 – 2014-10-08 (×3): 40 mg via ORAL
  Filled 2014-10-05 (×3): qty 1

## 2014-10-05 MED ORDER — ACETAMINOPHEN 325 MG PO TABS: 650.0000 mg | ORAL_TABLET | Freq: Four times a day (QID) | ORAL | Status: DC | PRN

## 2014-10-05 MED ORDER — INSULIN GLARGINE 100 UNIT/ML ~~LOC~~ SOLN
50.0000 [IU] | Freq: Every day | SUBCUTANEOUS | Status: DC
  Administered 2014-10-05 – 2014-10-07 (×3): 50 [IU] via SUBCUTANEOUS
  Filled 2014-10-05 (×4): qty 0.5

## 2014-10-05 MED ORDER — SODIUM CHLORIDE 0.9 % IV SOLN
INTRAVENOUS | Status: DC
  Administered 2014-10-05: 50 mL/h via INTRAVENOUS
  Administered 2014-10-06: 15:00:00 via INTRAVENOUS

## 2014-10-05 MED ORDER — CARVEDILOL 6.25 MG PO TABS
6.2500 mg | ORAL_TABLET | Freq: Two times a day (BID) | ORAL | Status: DC
  Administered 2014-10-05 – 2014-10-08 (×6): 6.25 mg via ORAL
  Filled 2014-10-05 (×8): qty 1

## 2014-10-05 MED ORDER — ACETAMINOPHEN 650 MG RE SUPP: 650.0000 mg | Freq: Four times a day (QID) | RECTAL | Status: DC | PRN

## 2014-10-05 MED ORDER — LORAZEPAM 2 MG/ML IJ SOLN
1.0000 mg | Freq: Once | INTRAMUSCULAR | Status: AC
  Administered 2014-10-05: 1 mg via INTRAVENOUS
  Filled 2014-10-05: qty 1

## 2014-10-05 MED ORDER — NICOTINE 7 MG/24HR TD PT24
7.0000 mg | MEDICATED_PATCH | Freq: Every day | TRANSDERMAL | Status: DC
  Administered 2014-10-05 – 2014-10-08 (×4): 7 mg via TRANSDERMAL
  Filled 2014-10-05 (×4): qty 1

## 2014-10-05 MED ORDER — B COMPLEX-C PO TABS
1.0000 | ORAL_TABLET | Freq: Every day | ORAL | Status: DC
  Administered 2014-10-06 – 2014-10-08 (×3): 1 via ORAL
  Filled 2014-10-05 (×3): qty 1

## 2014-10-05 MED ORDER — SENNOSIDES-DOCUSATE SODIUM 8.6-50 MG PO TABS: 1.0000 | ORAL_TABLET | Freq: Every evening | ORAL | Status: DC | PRN

## 2014-10-05 MED ORDER — POLYETHYLENE GLYCOL 3350 17 G PO PACK
17.0000 g | PACK | Freq: Every day | ORAL | Status: DC
  Administered 2014-10-07: 17 g via ORAL
  Filled 2014-10-05 (×3): qty 1

## 2014-10-05 MED ORDER — HYDROCERIN EX CREA
1.0000 "application " | TOPICAL_CREAM | Freq: Two times a day (BID) | CUTANEOUS | Status: DC
  Administered 2014-10-05 – 2014-10-08 (×6): 1 via TOPICAL
  Filled 2014-10-05: qty 113

## 2014-10-05 MED ORDER — GABAPENTIN 300 MG PO CAPS: 300.0000 mg | ORAL_CAPSULE | Freq: Two times a day (BID) | ORAL | Status: DC

## 2014-10-05 MED ORDER — AMITRIPTYLINE HCL 25 MG PO TABS
50.0000 mg | ORAL_TABLET | Freq: Every day | ORAL | Status: DC
  Administered 2014-10-05 – 2014-10-06 (×2): 50 mg via ORAL
  Filled 2014-10-05 (×2): qty 2

## 2014-10-05 MED ORDER — INSULIN ASPART 100 UNIT/ML ~~LOC~~ SOLN
0.0000 [IU] | Freq: Three times a day (TID) | SUBCUTANEOUS | Status: DC
  Administered 2014-10-05: 3 [IU] via SUBCUTANEOUS
  Administered 2014-10-06 – 2014-10-07 (×3): 4 [IU] via SUBCUTANEOUS

## 2014-10-05 MED ORDER — INSULIN ASPART 100 UNIT/ML ~~LOC~~ SOLN: 0.0000 [IU] | Freq: Every day | SUBCUTANEOUS | Status: DC

## 2014-10-05 MED ORDER — AMLODIPINE BESYLATE 10 MG PO TABS
10.0000 mg | ORAL_TABLET | Freq: Every day | ORAL | Status: DC
  Administered 2014-10-05 – 2014-10-07 (×3): 10 mg via ORAL
  Filled 2014-10-05 (×3): qty 1

## 2014-10-05 MED ORDER — SODIUM CHLORIDE 0.9 % IV BOLUS (SEPSIS)
1000.0000 mL | Freq: Once | INTRAVENOUS | Status: AC
  Administered 2014-10-05: 1000 mL via INTRAVENOUS

## 2014-10-05 MED ORDER — ALBUTEROL SULFATE (2.5 MG/3ML) 0.083% IN NEBU
2.5000 mg | INHALATION_SOLUTION | Freq: Four times a day (QID) | RESPIRATORY_TRACT | Status: DC | PRN
  Administered 2014-10-05 – 2014-10-08 (×4): 2.5 mg via RESPIRATORY_TRACT
  Filled 2014-10-05 (×4): qty 3

## 2014-10-05 MED ORDER — HEPARIN SODIUM (PORCINE) 5000 UNIT/ML IJ SOLN
5000.0000 [IU] | Freq: Three times a day (TID) | INTRAMUSCULAR | Status: DC
  Administered 2014-10-05 – 2014-10-08 (×8): 5000 [IU] via SUBCUTANEOUS
  Filled 2014-10-05 (×8): qty 1

## 2014-10-05 MED ORDER — DOCUSATE SODIUM 100 MG PO CAPS
100.0000 mg | ORAL_CAPSULE | Freq: Two times a day (BID) | ORAL | Status: DC
  Administered 2014-10-05 – 2014-10-07 (×5): 100 mg via ORAL
  Filled 2014-10-05 (×5): qty 1

## 2014-10-05 MED ORDER — LEVOTHYROXINE SODIUM 50 MCG PO TABS
50.0000 ug | ORAL_TABLET | Freq: Every day | ORAL | Status: DC
  Administered 2014-10-06 – 2014-10-08 (×3): 50 ug via ORAL
  Filled 2014-10-05 (×3): qty 1

## 2014-10-05 MED ORDER — BISACODYL 10 MG RE SUPP
10.0000 mg | RECTAL | Status: DC | PRN
  Administered 2014-10-06: 10 mg via RECTAL
  Filled 2014-10-05: qty 1

## 2014-10-05 MED ORDER — CLONIDINE HCL 0.2 MG PO TABS
0.2000 mg | ORAL_TABLET | Freq: Two times a day (BID) | ORAL | Status: DC
  Administered 2014-10-05 – 2014-10-08 (×6): 0.2 mg via ORAL
  Filled 2014-10-05 (×6): qty 1

## 2014-10-05 MED ORDER — ASPIRIN 325 MG PO TABS
325.0000 mg | ORAL_TABLET | Freq: Every day | ORAL | Status: DC
  Administered 2014-10-05 – 2014-10-08 (×4): 325 mg via ORAL
  Filled 2014-10-05 (×4): qty 1

## 2014-10-05 MED ORDER — SODIUM CHLORIDE 0.9 % IJ SOLN
3.0000 mL | Freq: Two times a day (BID) | INTRAMUSCULAR | Status: DC
  Administered 2014-10-05 – 2014-10-08 (×3): 3 mL via INTRAVENOUS

## 2014-10-05 MED ORDER — ESCITALOPRAM OXALATE 10 MG PO TABS
20.0000 mg | ORAL_TABLET | Freq: Every day | ORAL | Status: DC
  Administered 2014-10-06: 20 mg via ORAL
  Filled 2014-10-05 (×2): qty 2

## 2014-10-05 MED ORDER — ROSUVASTATIN CALCIUM 10 MG PO TABS
20.0000 mg | ORAL_TABLET | Freq: Every evening | ORAL | Status: DC
  Administered 2014-10-05 – 2014-10-07 (×3): 20 mg via ORAL
  Filled 2014-10-05 (×3): qty 2

## 2014-10-05 NOTE — ED Notes (Signed)
Pt brought in via [facility] from [place] with complaints of dizziness for the past few weeks that has been getting progressively worse. Pt is also complaining of an ear ache that started about a week ago. Pt is A/O. Pt denies any SOB, or CP. Pt has a history of HTN.

## 2014-10-05 NOTE — H&P (Addendum)
[facility] History and Physical                                                                                    [name], is a 80 y.o. female  MRN: [id number]   DOB - 10-Mar-1935  Admit Date - 10/05/2014  Outpatient Primary MD for the patient is Sally Neer, MD  Referring Physician:  Alvina Chou, PA-C  Chief Complaint:   Chief Complaint  Patient presents with  . Dizziness     HPI  [name]  is a 80 y.o. female, with a past medical history of COPD, TIA, diabetes with neuropathy, who has lived at [facility] facility for a couple of years. Was brought to the emergency department this morning for dizziness and tremor.  She was seen in the ER for weakness just 2 days ago diagnosed with a possible UTI and discharged with antibiotics. The patient reports weakness and dizziness for the past 3 weeks. She states that the room does not spin around her rather she just feels swimmy headed. She complains of shakiness particularly when she is intentionally picking up or holding onto an object. She states this is new for her. She complains of muffled hearing out of her right ear.   After reviewing the chart I see that Dr. Leonie Wilcox put [name] on a 325 mg aspirin after her TIA 1 year ago. According to her MAR it appears she is on an 81 mg aspirin. In the ER her workup is noteworthy for an elevated BUN and creatinine (45/2.25) compared to just 2 days ago.  A UA with large leukocytes and 11-20 WBCs with few bacteria.  And she is too dizzy to stand for orthostatic vital signs.  She is also bradycardic with a pulse rate between 47 and 55. EKG has no significant changes and shows a right bundle-branch block.   Review of Systems   In addition to the HPI above,  No Fever-chills, No Headache, No changes with Vision or hearing, No problems swallowing food or Liquids, No Chest pain, Cough or Shortness of Breath, No Abdominal pain, No Nausea or Vomiting, Bowel  movements are regular, No Blood in stool or Urine, No dysuria, No new skin rashes or bruises, No new joints pains-aches,  No new weakness, tingling, numbness in any extremity, No recent weight gain or loss, A full 10 point Review of Systems was done, except as stated above, all other Review of Systems were negative.  Past Medical History  Past Medical History  Diagnosis Date  . Hypertension   . Macular degeneration, bilateral   . Type II diabetes mellitus   . COPD (chronic obstructive pulmonary disease)   . Diabetic peripheral neuropathy   . Hypercholesteremia   . PAD (peripheral artery disease)     a. 1997 s/p R fem-pop bypass;  b. 03/1999 R CIA stenting.  . CKD (chronic kidney disease), stage III   . Headache(784.0)   . Hypothyroid   . Non-obstructive CAD     a. 11/2005 Cath: LM nl, LAD 30 diffuse, LCX small, 20p, 28m/d, 75d into OM, RCA dominant, 40p, 30-40 diffuse, EF 55%.  Marland Kitchen TIA (transient  ischemic attack)     a. 09/2013.  . H/O echocardiogram     a. 09/2013 Echo: nl LV fxn, no rwma, mildly to mod dil LA, mildly dil RA.  . Carotid arterial disease     a. 09/2013 Carotid U/S: 40-59% bilat ICA stenosis.  . Enterococcus UTI     a. 09/2013.  . Tobacco abuse   . Cholelithiasis   . Pyelonephritis     Past Surgical History  Procedure Laterality Date  . Tonsillectomy  1940's  . Abdominal hysterectomy  1970's  . Dilation and curettage of uterus  1970's    "probably 2" (11/01/2012)  . Cataract extraction w/ intraocular lens  implant, bilateral Bilateral ~ 2010  . Bypass graft Right ~ 1997    RLE by Dr. Gwenlyn Wilcox 11/29/2005 (11/01/2012)  . Cystostomy w/ bladder biopsy  2005    [name] 09/19/2003 (11/01/2012)  . Anterior cervical decomp/discectomy fusion  01/02/2006    [name] 01/02/2006 (11/01/2012)  . Shoulder open rotator cuff repair Bilateral 1990's-2000's    "3X on the left; twice on the right" (11/01/2012)  . Cardiac catheterization  11/29/2005    [name] 11/29/2005 (11/01/2012)   . Esophagogastroduodenoscopy N/A 10/14/2013    Procedure: ESOPHAGOGASTRODUODENOSCOPY (EGD);  Surgeon: Sally Sabins, MD;  Location: [facility];  Service: Endoscopy;  Laterality: N/A;      Social History History  Substance Use Topics  . Smoking status: Current Every Day Smoker -- 0.12 packs/day for 60 years    Types: Cigarettes  . Smokeless tobacco: Never Used     Comment: states she smokes 4 cigs per day  . Alcohol Use: No   lives at skilled nursing facility.  Family History Father's history is unknown. Mother died had severe diabetes and died with complications  Prior to Admission medications   Medication Sig Start Date End Date Taking? Authorizing Provider  acetaminophen (TYLENOL) 500 MG tablet Take 500 mg by mouth 3 (three) times daily. 8am 12pm, 4pm   Yes Historical Provider, MD  albuterol (PROVENTIL) (2.5 MG/3ML) 0.083% nebulizer solution Take 2.5 mg by nebulization every 6 (six) hours as needed for wheezing.   Yes Historical Provider, MD  ALPRAZolam (XANAX) 0.25 MG tablet Take 1 tablet (0.25 mg total) by mouth 2 (two) times daily as needed for anxiety. 08/13/14  Yes Sally Dooms, MD  alum & mag hydroxide-simeth (MAALOX/MYLANTA) 200-200-20 MG/5ML suspension Take 30 mLs by mouth every 6 (six) hours as needed for indigestion or heartburn. Do not exceed 4 doses in 24 hours   Yes Historical Provider, MD  amitriptyline (ELAVIL) 50 MG tablet Take 50 mg by mouth at bedtime.   Yes Historical Provider, MD  amLODipine (NORVASC) 10 MG tablet Take 10 mg by mouth at bedtime.    Yes Historical Provider, MD  aspirin 81 MG chewable tablet Chew 81 mg by mouth daily.   Yes Historical Provider, MD  B Complex-C (B-COMPLEX WITH VITAMIN C) tablet Take 1 tablet by mouth daily.   Yes Historical Provider, MD  bisacodyl (DULCOLAX) 10 MG suppository Place 10 mg rectally as needed for moderate constipation.   Yes Historical Provider, MD  carvedilol (COREG) 25 MG tablet Take 25 mg by mouth 2 (two) times  daily with a meal. 0800 and 1700   Yes Historical Provider, MD  cephALEXin (KEFLEX) 500 MG capsule Take 1 capsule (500 mg total) by mouth 4 (four) times daily. 10/03/14  Yes Waynetta Pean, PA-C  cloNIDine (CATAPRES) 0.2 MG tablet Take 0.2 mg by mouth 2 (two)  times daily. Hold if blood pressure less than 100/60   Yes Historical Provider, MD  Cranberry 500 MG CAPS Take 1,000 mg by mouth 2 (two) times daily.   Yes Historical Provider, MD  docusate sodium (COLACE) 100 MG capsule Take 1 capsule (100 mg total) by mouth every 12 (twelve) hours. 08/07/14  Yes Kristen N Ward, DO  escitalopram (LEXAPRO) 20 MG tablet Take 20 mg by mouth daily.   Yes Historical Provider, MD  furosemide (LASIX) 40 MG tablet Take 1 tablet (40 mg total) by mouth daily. 8am 2pm 08/13/14  Yes Reyne Dumas, MD  gabapentin (NEURONTIN) 300 MG capsule Take 1 capsule (300 mg total) by mouth 2 (two) times daily. 08/13/14  Yes Reyne Dumas, MD  guaiFENesin (ROBITUSSIN) 100 MG/5ML liquid Take 100 mg by mouth every 6 (six) hours as needed for cough. Not to exceed 4 doses in 24 hours   Yes Historical Provider, MD  HYDROcodone-acetaminophen (NORCO/VICODIN) 5-325 MG per tablet 1 by mouth every 6 hours as needed for moderate pain, 2 by mouth every 6 hours as needed for sever pain DO NOT EXCEED 4 GM OF APAP IN 24 HOURS 08/13/14  Yes Sally Dooms, MD  insulin glargine (LANTUS) 100 UNIT/ML injection Inject 0.4 mLs (40 Units total) into the skin daily. Patient taking differently: Inject 50 Units into the skin at bedtime.  08/13/14  Yes Reyne Dumas, MD  insulin lispro (HUMALOG) 100 UNIT/ML injection Inject 0-22 Units into the skin 3 (three) times daily with meals. CBG 0-90 0 units, 91-150 6 units, 151-200 10 units, 201-250 12 units, 251-300 14 units, 301-350 16 units, 351-400 18 units, 401-450 22 units, 451 or more 22 units and call md   Yes Historical Provider, MD  levothyroxine (SYNTHROID, LEVOTHROID) 50 MCG tablet Take 50 mcg by mouth daily before  breakfast.    Yes Historical Provider, MD  loperamide (IMODIUM) 2 MG capsule Take 2 mg by mouth as needed for diarrhea or loose stools. Do not exceed 8 doses in 24 hours   Yes Historical Provider, MD  ondansetron (ZOFRAN ODT) 4 MG disintegrating tablet Take 1 tablet (4 mg total) by mouth every 8 (eight) hours as needed for nausea or vomiting. 08/07/14  Yes Kristen N Ward, DO  pantoprazole (PROTONIX) 40 MG tablet Take 1 tablet (40 mg total) by mouth daily. 08/13/14  Yes Reyne Dumas, MD  polyethylene glycol (MIRALAX / GLYCOLAX) packet Take 17 g by mouth daily. Mix in 8 oz liquid and drink   Yes Historical Provider, MD  rosuvastatin (CRESTOR) 20 MG tablet Take 20 mg by mouth every evening. 4pm   Yes Historical Provider, MD  Skin Protectants, Misc. (MINERIN) CREA Apply 1 application topically 2 (two) times daily. Apply to both lower extremities and feet twice daily   Yes Historical Provider, MD  Vitamin D, Ergocalciferol, (DRISDOL) 50000 UNITS CAPS capsule Take 50,000 Units by mouth every 7 (seven) days. On Saturdays   Yes Historical Provider, MD    Allergies  Allergen Reactions  . Ace Inhibitors Other (See Comments)    Listed on MAR  . Codeine Other (See Comments)    Listed on MAR  . Lisinopril Other (See Comments)    unknown  . Penicillins Hives  . Trimethoprim   . Septra [Sulfamethoxazole-Trimethoprim] Hives and Rash  . Sulfur Hives and Rash    Physical Exam  Vitals  Blood pressure 144/42, pulse 55, temperature 97.9 F (36.6 C), temperature source Oral, resp. rate 16, height 5\' 3"  (1.6 m),  weight 90.493 kg (199 lb 8 oz), SpO2 100 %.   General:  Elderly, pleasant female lying in bed in NAD  Psych:  Normal affect and insight, Not Suicidal or Homicidal, Awake Alert, Oriented X 3.  Neuro:   Patient has some difficulty following directions to puff out her cheeks, she is unable to completely extraocular eye movements. Peripheral vision is intact bilaterally, sensation is intact, strength  is symmetric.  ENT:  Right ear has a waxy cerumen impaction.  Neck:  Supple, No lymphadenopathy appreciated  Respiratory:  Symmetrical chest wall movement, Good air movement bilaterally, CTAB.  Cardiac:  Bradycardic, No Murmurs, no LE edema noted, no JVD.    Abdomen:  Positive bowel sounds, Soft, Non tender, Non distended,  No masses appreciated  Skin:  No Cyanosis, Normal Skin Turgor, No Skin Rash or Bruise.  Extremities:  Able to move all 4. 5/5 strength in each,  no effusions.  Data Review  CBC  Recent Labs Lab 10/03/14 1218 10/05/14 1123  WBC 7.9 8.2  HGB 11.7* 10.5*  HCT 38.0 33.9*  PLT 194 215  MCV 92.2 92.9  MCH 28.4 28.8  MCHC 30.8 31.0  RDW 14.2 14.3  LYMPHSABS 2.6 2.5  MONOABS 1.0 1.4*  EOSABS 0.2 0.2  BASOSABS 0.0 0.0    Chemistries   Recent Labs Lab 10/03/14 1218 10/05/14 1123  NA 140 139  K 4.8 4.8  CL 105 103  CO2 27 25  GLUCOSE 105* 116*  BUN 35* 45*  CREATININE 1.81* 2.25*  CALCIUM 9.5 9.4  AST 20  --   ALT 14  --   ALKPHOS 62  --   BILITOT 0.3  --      Cardiac Enzymes  Recent Labs Lab 10/03/14 1218  TROPONINI <0.03    Urinalysis    Component Value Date/Time   COLORURINE YELLOW 10/05/2014 1152   APPEARANCEUR CLOUDY* 10/05/2014 1152   LABSPEC 1.015 10/05/2014 1152   PHURINE 5.0 10/05/2014 1152   GLUCOSEU NEGATIVE 10/05/2014 [place] 10/05/2014 [place] 10/05/2014 [place] 10/05/2014 1152   PROTEINUR 30* 10/05/2014 1152   UROBILINOGEN 0.2 10/05/2014 1152   NITRITE NEGATIVE 10/05/2014 1152   LEUKOCYTESUR LARGE* 10/05/2014 1152    Imaging results:   Ct Abdomen Pelvis Wo Contrast  10/03/2014   CLINICAL DATA:  Patient fell wall trying to get into bend today. No complaints after falling. Complete is generalized weakness. History of hypertension, diabetes, TIA, CAD, hysterectomy, chronic kidney disease, cardiac catheterization.  EXAM: CT ABDOMEN AND PELVIS WITHOUT CONTRAST   TECHNIQUE: Multidetector CT imaging of the abdomen and pelvis was performed following the standard protocol without IV contrast.  COMPARISON:  08/10/2014 ultrasound and 10/12/2013 CT  FINDINGS: Lower chest: There is a calcified granuloma at the left lung base. Coronary artery calcifications are present. No pericardial effusion. Heart size is normal.  Upper abdomen: No focal abnormality within the liver, spleen, pancreas, or adrenal glands. There is dense calcification of the renal arteries bilaterally. No definite renal masses. No hydronephrosis. No ureteral stones. The gallbladder is present. Possible layering small stones or dense sludge.  Gastrointestinal tract: The stomach and small bowel loops are normal in appearance. The appendix is well seen and has a normal appearance. Colonic loops are normal in wall thickness and caliber. There is moderate stool burden especially involving the right colon.  Pelvis: The uterus is absent. There is no free pelvic fluid. No pelvic adenopathy. No adnexal mass.  Retroperitoneum: There is dense calcification of the aorta. Right iliac artery stent. No retroperitoneal or mesenteric adenopathy.  Abdominal wall: Unremarkable.  Osseous structures: There is significant spondylosis of the lower thoracic and lumbar spine. No suspicious lytic or blastic lesions are identified. Chronic deformity of the right ischial tuberosity. No acute fractures identified.  IMPRESSION: 1. No evidence for acute injury of the abdomen or pelvis. 2. Coronary artery disease. 3. Suspect small layering stones or dense sludge within the gallbladder. 4. Moderate stool burden. 5. Status post hysterectomy. 6. Old deformity of the right ischial tuberosity.   Electronically Signed   By: Nolon Nations M.D.   On: 10/03/2014 15:02   Dg Chest 2 View  10/03/2014   CLINICAL DATA:  Shortness of breath.  The patient fell today.  COPD.  EXAM: CHEST  2 VIEW  COMPARISON:  08/09/2014 and 11/22/2013  FINDINGS: The heart  size and pulmonary vascularity are normal and the lungs are clear. There is calcification in the thoracic aorta. No acute osseous abnormality. Lower cervical fusion. Previous right shoulder surgery.  IMPRESSION: No acute abnormalities.   Electronically Signed   By: Lorriane Shire M.D.   On: 10/03/2014 11:51   Ct Head Wo Contrast  10/03/2014   CLINICAL DATA:  The patient fell trying to get into bed today. Fatigue. Right leg weakness.  EXAM: CT HEAD WITHOUT CONTRAST  TECHNIQUE: Contiguous axial images were obtained from the base of the skull through the vertex without intravenous contrast.  COMPARISON:  03/17/2014  FINDINGS: There is diffuse cerebral cortical atrophy with secondary ventricular dilatation. The atrophy is most severe in the temporal lobes. Extensive chronic periventricular white matter lucency consistent with chronic small vessel ischemic disease, stable. There is no acute intracranial hemorrhage, acute infarction, or intracranial mass lesion. Osseous structures are normal.  IMPRESSION: No acute abnormalities. Atrophy and chronic small vessel ischemic disease.   Electronically Signed   By: Lorriane Shire M.D.   On: 10/03/2014 14:48    My personal review of EKG: Bradycardic with right bundle branch block.   Assessment & Plan  Principal Problem:   Acute on chronic renal failure Active Problems:   Hypothyroidism   Diabetes mellitus without complication   Anemia   COPD (chronic obstructive pulmonary disease)   Near syncope   Tremor of both hands   Bradycardia   Wax in ear  Acute on chronic renal failure Likely due to dehydration from medications (Lasix). Will hold Lasix, give gentle IV fluids and monitor creatinine. Previous discharge sig is confusing I am uncertain about whether she was taking Lasix once or twice a day.  Dizziness with near syncopal episode. Could be multifactorial. Patient is bradycardic and unable to stand for orthostatic vital signs. We'll reduce Coreg and  give IV fluids. Recent check orthostatic vitals 5/23. Also could be due to dehydration. Holding Lasix. Finally I am concerned for possible TIA or CVA.  Will check MR brain.  Check TSH. If dizziness continues would consider decreasing Vicodin.  Xanax has been held.   Upper extremity bilateral tremor MR brain. Gentle IV hydration. Am uncertain of the etiology of this. May consult neurology depending on results of MR.  Consider Cogentin if no improvement prior to discharge.   Hypothyroidism Continue levothyroxine.  Waxy impaction in right ear Debrox bid.  Constipation Continue stool softeners. Due to her elevated   Tobacco use Nicotine patch  History of TIA Restart 325 mg aspirin, continue Crestor.  Diabetes Continue insulin regimen with 40 units of  Lantus (rather than 50) and sliding scale resistant.  Hypertension Controlled.  Will decrease carvedilol from 25 mg twice a day to 6.25 mg twice a day given bradycardia. Continue amlodipine continue clonidine  Hyperlipidemia Continue Crestor  Carotid arterial disease 325 mg aspirin.  Will recheck carotid dopplers given her symptoms.   Consultants Called:  none  Family Communication:   Attempted to call son.  Code Status:  Full code.  Condition:  stable  Potential Disposition:  Return to SNF in 48 hours.  Time spent in minutes : [address],  PA-C on 10/05/2014 at 5:37 PM Between 7am to 7pm - Pager - [phone] After 7pm go to [email or web address] - password [email or web address] And look for the night coverage person covering me after hours  [facility]  Addendum:    Attending I have personally interviewed and examined the patient and the treatment plan was discussed with the physician extender.  I have directly reviewed the clinical findings, lab results, imaging studies of this patient.  I agree with the documentation as recorded by the physician extender.    The patient is a 80 y.o. female, with a past medical  history of COPD, TIA, diabetes with neuropathy who presents with tremor, weakness, and dizziness .  Her tremor has only been present for the last several weeks.  She has a mild intention tremor on exam, strength 5-/5 throughout, no obvious CN deficits.  Possible that she has had a stroke, however, I suspect that this may be due to her gabapentin in the setting of intermittent AKI.  I will discontinue her gabapentin.  I doubt that her bradycardia is contributing to her symptoms since her previous ECG demonstrate similar heart rates.  Agree with a trial of reducing her beta blocker slightly also.  Hold lasix, hydrate gently and repeat BMP in AM.

## 2014-10-05 NOTE — ED Notes (Signed)
Pt unable to stand for accurate orthostatic V/S.

## 2014-10-05 NOTE — ED Notes (Signed)
Appointment time @ 14:45p

## 2014-10-05 NOTE — Progress Notes (Signed)
  Echocardiogram 2D Echocardiogram has been performed.  [name] 10/05/2014, 4:28 PM

## 2014-10-05 NOTE — ED Provider Notes (Signed)
CSN: [id number]     Arrival date & time 10/05/14  1052 History   First MD Initiated Contact with Patient 10/05/14 1056     Chief Complaint  Patient presents with  . Dizziness     (Consider location/radiation/quality/duration/timing/severity/associated sxs/prior Treatment) HPI Comments: Patient is a 80 year old female with a past medical history of diabetes, COPD, CAD, hypertension, and CKD who presents with lightheadedness and generalized weakness for the past few weeks. Symptoms started gradually and has progressively worsened since the onset. Patient reports a near syncopal episode when she sat up this morning. She became concerned about her symptoms and wanted to return to the ED to make sure everything was OK. Patient reports she does not walk at baseline because she has been "too weak" but this is ongoing. Any exertion makes her symptoms worse. She reports she has been sleeping a lot more than usual. She reports having a productive cough with green sputum for the past 3 days. No fever, chest pain, abdominal pain, or any other associated symptoms. No alleviating factors. She was diagnosed with a UTI when she was here 2 days ago and treated with keflex.    Past Medical History  Diagnosis Date  . Hypertension   . Macular degeneration, bilateral   . Type II diabetes mellitus   . COPD (chronic obstructive pulmonary disease)   . Diabetic peripheral neuropathy   . Hypercholesteremia   . PAD (peripheral artery disease)     a. 1997 s/p R fem-pop bypass;  b. 03/1999 R CIA stenting.  . CKD (chronic kidney disease), stage III   . Headache(784.0)   . Hypothyroid   . Non-obstructive CAD     a. 11/2005 Cath: LM nl, LAD 30 diffuse, LCX small, 20p, 53m/d, 75d into OM, RCA dominant, 40p, 30-40 diffuse, EF 55%.  Marland Kitchen TIA (transient ischemic attack)     a. 09/2013.  . H/O echocardiogram     a. 09/2013 Echo: nl LV fxn, no rwma, mildly to mod dil LA, mildly dil RA.  . Carotid arterial disease     a. 09/2013  Carotid U/S: 40-59% bilat ICA stenosis.  . Enterococcus UTI     a. 09/2013.  . Tobacco abuse   . Cholelithiasis   . Pyelonephritis    Past Surgical History  Procedure Laterality Date  . Tonsillectomy  1940's  . Abdominal hysterectomy  1970's  . Dilation and curettage of uterus  1970's    "probably 2" (11/01/2012)  . Cataract extraction w/ intraocular lens  implant, bilateral Bilateral ~ 2010  . Bypass graft Right ~ 1997    RLE by Dr. Gwenlyn Perking 11/29/2005 (11/01/2012)  . Cystostomy w/ bladder biopsy  2005    Archie Endo 09/19/2003 (11/01/2012)  . Anterior cervical decomp/discectomy fusion  01/02/2006    Archie Endo 01/02/2006 (11/01/2012)  . Shoulder open rotator cuff repair Bilateral 1990's-2000's    "3X on the left; twice on the right" (11/01/2012)  . Cardiac catheterization  11/29/2005    Archie Endo 11/29/2005 (11/01/2012)  . Esophagogastroduodenoscopy N/A 10/14/2013    Procedure: ESOPHAGOGASTRODUODENOSCOPY (EGD);  Surgeon: Missy Sabins, MD;  Location: [facility];  Service: Endoscopy;  Laterality: N/A;   No family history on file. History  Substance Use Topics  . Smoking status: Current Every Day Smoker -- 0.12 packs/day for 60 years    Types: Cigarettes  . Smokeless tobacco: Never Used     Comment: states she smokes 4 cigs per day  . Alcohol Use: No   OB History  No data available     Review of Systems  Constitutional: Negative for fever, chills and fatigue.  HENT: Negative for trouble swallowing.   Eyes: Negative for visual disturbance.  Respiratory: Negative for shortness of breath.   Cardiovascular: Negative for chest pain and palpitations.  Gastrointestinal: Negative for nausea, vomiting, abdominal pain and diarrhea.  Genitourinary: Negative for dysuria and difficulty urinating.  Musculoskeletal: Negative for arthralgias and neck pain.  Skin: Negative for color change.  Neurological: Positive for weakness and light-headedness. Negative for dizziness.  Psychiatric/Behavioral:  Negative for dysphoric mood.      Allergies  Ace inhibitors; Codeine; Lisinopril; Penicillins; Trimethoprim; Septra; and Sulfur  Home Medications   Prior to Admission medications   Medication Sig Start Date End Date Taking? Authorizing Provider  acetaminophen (TYLENOL) 500 MG tablet Take 500 mg by mouth 3 (three) times daily. 8am 12pm, 4pm   Yes Historical Provider, MD  albuterol (PROVENTIL) (2.5 MG/3ML) 0.083% nebulizer solution Take 2.5 mg by nebulization every 6 (six) hours as needed for wheezing.   Yes Historical Provider, MD  ALPRAZolam (XANAX) 0.25 MG tablet Take 1 tablet (0.25 mg total) by mouth 2 (two) times daily as needed for anxiety. 08/13/14  Yes Estill Dooms, MD  alum & mag hydroxide-simeth (MAALOX/MYLANTA) 200-200-20 MG/5ML suspension Take 30 mLs by mouth every 6 (six) hours as needed for indigestion or heartburn. Do not exceed 4 doses in 24 hours   Yes Historical Provider, MD  amitriptyline (ELAVIL) 50 MG tablet Take 50 mg by mouth at bedtime.   Yes Historical Provider, MD  amLODipine (NORVASC) 10 MG tablet Take 10 mg by mouth at bedtime.    Yes Historical Provider, MD  aspirin 81 MG chewable tablet Chew 81 mg by mouth daily.   Yes Historical Provider, MD  B Complex-C (B-COMPLEX WITH VITAMIN C) tablet Take 1 tablet by mouth daily.   Yes Historical Provider, MD  bisacodyl (DULCOLAX) 10 MG suppository Place 10 mg rectally as needed for moderate constipation.   Yes Historical Provider, MD  carvedilol (COREG) 25 MG tablet Take 25 mg by mouth 2 (two) times daily with a meal. 0800 and 1700   Yes Historical Provider, MD  cephALEXin (KEFLEX) 500 MG capsule Take 1 capsule (500 mg total) by mouth 4 (four) times daily. 10/03/14  Yes Waynetta Pean, PA-C  cloNIDine (CATAPRES) 0.2 MG tablet Take 0.2 mg by mouth 2 (two) times daily. Hold if blood pressure less than 100/60   Yes Historical Provider, MD  Cranberry 500 MG CAPS Take 1,000 mg by mouth 2 (two) times daily.   Yes Historical  Provider, MD  docusate sodium (COLACE) 100 MG capsule Take 1 capsule (100 mg total) by mouth every 12 (twelve) hours. 08/07/14  Yes Kristen N Ward, DO  escitalopram (LEXAPRO) 20 MG tablet Take 20 mg by mouth daily.   Yes Historical Provider, MD  furosemide (LASIX) 40 MG tablet Take 1 tablet (40 mg total) by mouth daily. 8am 2pm 08/13/14  Yes Reyne Dumas, MD  gabapentin (NEURONTIN) 300 MG capsule Take 1 capsule (300 mg total) by mouth 2 (two) times daily. 08/13/14  Yes Reyne Dumas, MD  guaiFENesin (ROBITUSSIN) 100 MG/5ML liquid Take 100 mg by mouth every 6 (six) hours as needed for cough. Not to exceed 4 doses in 24 hours   Yes Historical Provider, MD  HYDROcodone-acetaminophen (NORCO/VICODIN) 5-325 MG per tablet 1 by mouth every 6 hours as needed for moderate pain, 2 by mouth every 6 hours as needed for sever  pain DO NOT EXCEED 4 GM OF APAP IN 24 HOURS 08/13/14  Yes Estill Dooms, MD  insulin glargine (LANTUS) 100 UNIT/ML injection Inject 0.4 mLs (40 Units total) into the skin daily. Patient taking differently: Inject 50 Units into the skin at bedtime.  08/13/14  Yes Reyne Dumas, MD  insulin lispro (HUMALOG) 100 UNIT/ML injection Inject 0-22 Units into the skin 3 (three) times daily with meals. CBG 0-90 0 units, 91-150 6 units, 151-200 10 units, 201-250 12 units, 251-300 14 units, 301-350 16 units, 351-400 18 units, 401-450 22 units, 451 or more 22 units and call md   Yes Historical Provider, MD  levothyroxine (SYNTHROID, LEVOTHROID) 50 MCG tablet Take 50 mcg by mouth daily before breakfast.    Yes Historical Provider, MD  loperamide (IMODIUM) 2 MG capsule Take 2 mg by mouth as needed for diarrhea or loose stools. Do not exceed 8 doses in 24 hours   Yes Historical Provider, MD  ondansetron (ZOFRAN ODT) 4 MG disintegrating tablet Take 1 tablet (4 mg total) by mouth every 8 (eight) hours as needed for nausea or vomiting. 08/07/14  Yes Kristen N Ward, DO  pantoprazole (PROTONIX) 40 MG tablet Take 1 tablet  (40 mg total) by mouth daily. 08/13/14  Yes Reyne Dumas, MD  polyethylene glycol (MIRALAX / GLYCOLAX) packet Take 17 g by mouth daily. Mix in 8 oz liquid and drink   Yes Historical Provider, MD  rosuvastatin (CRESTOR) 20 MG tablet Take 20 mg by mouth every evening. 4pm   Yes Historical Provider, MD  Skin Protectants, Misc. (MINERIN) CREA Apply 1 application topically 2 (two) times daily. Apply to both lower extremities and feet twice daily   Yes Historical Provider, MD  Vitamin D, Ergocalciferol, (DRISDOL) 50000 UNITS CAPS capsule Take 50,000 Units by mouth every 7 (seven) days. On Saturdays   Yes Historical Provider, MD  feeding supplement, GLUCERNA SHAKE, (GLUCERNA SHAKE) LIQD Take 237 mLs by mouth 2 (two) times daily between meals. Patient not taking: Reported on 10/03/2014 08/13/14   Reyne Dumas, MD   BP 124/36 mmHg  Pulse 48  Temp(Src) 97.5 F (36.4 C) (Oral)  Resp 12  Ht 5\' 3"  (1.6 m)  Wt 193 lb (87.544 kg)  BMI 34.20 kg/m2  SpO2 98% Physical Exam  Constitutional: She is oriented to person, place, and time. She appears well-developed and well-nourished. No distress.  HENT:  Head: Normocephalic and atraumatic.  Eyes: Conjunctivae and EOM are normal. Pupils are equal, round, and reactive to light.  Neck: Normal range of motion.  Cardiovascular: Normal rate and regular rhythm.  Exam reveals no gallop and no friction rub.   No murmur heard. Pulmonary/Chest: Effort normal. She has no wheezes. She has no rales. She exhibits no tenderness.   Mild expiratory rhonchi in the right lung.   Abdominal: Soft. She exhibits no distension. There is no tenderness. There is no rebound.  Musculoskeletal: Normal range of motion.  Neurological: She is alert and oriented to person, place, and time. No cranial nerve deficit. Coordination normal.  Extremity strength and sensation equal and intact bilaterally. Speech is goal-oriented. Moves limbs without ataxia.   Skin: Skin is warm and dry.  Psychiatric:  She has a normal mood and affect. Her behavior is normal.  Nursing note and vitals reviewed.   ED Course  Procedures (including critical care time) Labs Review Labs Reviewed  CBC WITH DIFFERENTIAL/PLATELET - Abnormal; Notable for the following:    RBC 3.65 (*)    Hemoglobin 10.5 (*)  HCT 33.9 (*)    Monocytes Relative 17 (*)    Monocytes Absolute 1.4 (*)    All other components within normal limits  BASIC METABOLIC PANEL - Abnormal; Notable for the following:    Glucose, Bld 116 (*)    BUN 45 (*)    Creatinine, Ser 2.25 (*)    GFR calc non Af Amer 20 (*)    GFR calc Af Amer 23 (*)    All other components within normal limits  URINALYSIS, ROUTINE W REFLEX MICROSCOPIC - Abnormal; Notable for the following:    APPearance CLOUDY (*)    Protein, ur 30 (*)    Leukocytes, UA LARGE (*)    All other components within normal limits  URINE MICROSCOPIC-ADD ON - Abnormal; Notable for the following:    Squamous Epithelial / LPF FEW (*)    Bacteria, UA FEW (*)    All other components within normal limits  URINE CULTURE  I-STAT TROPOININ, ED    Imaging Review Ct Abdomen Pelvis Wo Contrast  10/03/2014   CLINICAL DATA:  Patient fell wall trying to get into bend today. No complaints after falling. Complete is generalized weakness. History of hypertension, diabetes, TIA, CAD, hysterectomy, chronic kidney disease, cardiac catheterization.  EXAM: CT ABDOMEN AND PELVIS WITHOUT CONTRAST  TECHNIQUE: Multidetector CT imaging of the abdomen and pelvis was performed following the standard protocol without IV contrast.  COMPARISON:  08/10/2014 ultrasound and 10/12/2013 CT  FINDINGS: Lower chest: There is a calcified granuloma at the left lung base. Coronary artery calcifications are present. No pericardial effusion. Heart size is normal.  Upper abdomen: No focal abnormality within the liver, spleen, pancreas, or adrenal glands. There is dense calcification of the renal arteries bilaterally. No definite  renal masses. No hydronephrosis. No ureteral stones. The gallbladder is present. Possible layering small stones or dense sludge.  Gastrointestinal tract: The stomach and small bowel loops are normal in appearance. The appendix is well seen and has a normal appearance. Colonic loops are normal in wall thickness and caliber. There is moderate stool burden especially involving the right colon.  Pelvis: The uterus is absent. There is no free pelvic fluid. No pelvic adenopathy. No adnexal mass.  Retroperitoneum: There is dense calcification of the aorta. Right iliac artery stent. No retroperitoneal or mesenteric adenopathy.  Abdominal wall: Unremarkable.  Osseous structures: There is significant spondylosis of the lower thoracic and lumbar spine. No suspicious lytic or blastic lesions are identified. Chronic deformity of the right ischial tuberosity. No acute fractures identified.  IMPRESSION: 1. No evidence for acute injury of the abdomen or pelvis. 2. Coronary artery disease. 3. Suspect small layering stones or dense sludge within the gallbladder. 4. Moderate stool burden. 5. Status post hysterectomy. 6. Old deformity of the right ischial tuberosity.   Electronically Signed   By: Nolon Nations M.D.   On: 10/03/2014 15:02   Dg Chest 2 View  10/03/2014   CLINICAL DATA:  Shortness of breath.  The patient fell today.  COPD.  EXAM: CHEST  2 VIEW  COMPARISON:  08/09/2014 and 11/22/2013  FINDINGS: The heart size and pulmonary vascularity are normal and the lungs are clear. There is calcification in the thoracic aorta. No acute osseous abnormality. Lower cervical fusion. Previous right shoulder surgery.  IMPRESSION: No acute abnormalities.   Electronically Signed   By: Lorriane Shire M.D.   On: 10/03/2014 11:51   Ct Head Wo Contrast  10/03/2014   CLINICAL DATA:  The patient fell trying to  get into bed today. Fatigue. Right leg weakness.  EXAM: CT HEAD WITHOUT CONTRAST  TECHNIQUE: Contiguous axial images were obtained  from the base of the skull through the vertex without intravenous contrast.  COMPARISON:  03/17/2014  FINDINGS: There is diffuse cerebral cortical atrophy with secondary ventricular dilatation. The atrophy is most severe in the temporal lobes. Extensive chronic periventricular white matter lucency consistent with chronic small vessel ischemic disease, stable. There is no acute intracranial hemorrhage, acute infarction, or intracranial mass lesion. Osseous structures are normal.  IMPRESSION: No acute abnormalities. Atrophy and chronic small vessel ischemic disease.   Electronically Signed   By: Lorriane Shire M.D.   On: 10/03/2014 14:48     EKG Interpretation   Date/Time:  Sunday Oct 05 2014 11:05:03 EDT Ventricular Rate:  48 PR Interval:    QRS Duration: 140 QT Interval:  518 QTC Calculation: 463 R Axis:   -26 Text Interpretation:  sinus bradycardia with 1st degree AV block Right  bundle branch block Probable lateral infarct, old since last tracing no  significant change Confirmed by BELFI  MD, MELANIE ([id number]) on 10/05/2014  11:14:39 AM      MDM   Final diagnoses:  Near syncope    11:34 AM Labs and urinalysis pending. Vitals stable and patient afebrile.   Patient will be admitted to Hospitalist for further evaluation for near syncope.     [address] [place], PA-C 10/06/14 9326  Malvin Johns, MD 10/06/14 [phone]

## 2014-10-06 ENCOUNTER — Encounter (HOSPITAL_COMMUNITY): Payer: Medicare HMO

## 2014-10-06 ENCOUNTER — Encounter (HOSPITAL_COMMUNITY): Payer: Self-pay | Admitting: General Practice

## 2014-10-06 DIAGNOSIS — N179 Acute kidney failure, unspecified: Principal | ICD-10-CM

## 2014-10-06 DIAGNOSIS — R001 Bradycardia, unspecified: Secondary | ICD-10-CM

## 2014-10-06 DIAGNOSIS — N189 Chronic kidney disease, unspecified: Secondary | ICD-10-CM

## 2014-10-06 DIAGNOSIS — R55 Syncope and collapse: Secondary | ICD-10-CM

## 2014-10-06 DIAGNOSIS — R251 Tremor, unspecified: Secondary | ICD-10-CM

## 2014-10-06 LAB — CBC
HCT: 30 % — ABNORMAL LOW (ref 36.0–46.0)
Hemoglobin: 9.1 g/dL — ABNORMAL LOW (ref 12.0–15.0)
MCH: 28.1 pg (ref 26.0–34.0)
MCHC: 30.3 g/dL (ref 30.0–36.0)
MCV: 92.6 fL (ref 78.0–100.0)
Platelets: 211 10*3/uL (ref 150–400)
RBC: 3.24 MIL/uL — ABNORMAL LOW (ref 3.87–5.11)
RDW: 14.2 % (ref 11.5–15.5)
WBC: 7.7 10*3/uL (ref 4.0–10.5)

## 2014-10-06 LAB — COMPREHENSIVE METABOLIC PANEL
ALBUMIN: 2.9 g/dL — AB (ref 3.5–5.0)
ALK PHOS: 56 U/L (ref 38–126)
ALT: 12 U/L — AB (ref 14–54)
AST: 13 U/L — AB (ref 15–41)
Anion gap: 7 (ref 5–15)
BILIRUBIN TOTAL: 0.3 mg/dL (ref 0.3–1.2)
BUN: 44 mg/dL — ABNORMAL HIGH (ref 6–20)
CO2: 27 mmol/L (ref 22–32)
Calcium: 8.8 mg/dL — ABNORMAL LOW (ref 8.9–10.3)
Chloride: 106 mmol/L (ref 101–111)
Creatinine, Ser: 2.01 mg/dL — ABNORMAL HIGH (ref 0.44–1.00)
GFR calc non Af Amer: 23 mL/min — ABNORMAL LOW (ref >60)
GFR, EST AFRICAN AMERICAN: 26 mL/min — AB (ref >60)
Glucose, Bld: 146 mg/dL — ABNORMAL HIGH (ref 65–99)
POTASSIUM: 4.5 mmol/L (ref 3.5–5.1)
Sodium: 140 mmol/L (ref 135–145)
Total Protein: 6.3 g/dL — ABNORMAL LOW (ref 6.5–8.1)

## 2014-10-06 LAB — GLUCOSE, CAPILLARY
Glucose-Capillary: 114 mg/dL — ABNORMAL HIGH (ref 65–99)
Glucose-Capillary: 179 mg/dL — ABNORMAL HIGH (ref 65–99)
Glucose-Capillary: 165 mg/dL — ABNORMAL HIGH (ref 65–99)
Glucose-Capillary: 166 mg/dL — ABNORMAL HIGH (ref 65–99)

## 2014-10-06 LAB — HEMOGLOBIN A1C
HEMOGLOBIN A1C: 8.1 % — AB (ref 4.8–5.6)
MEAN PLASMA GLUCOSE: 186 mg/dL

## 2014-10-06 MED ORDER — LEVOFLOXACIN 250 MG PO TABS
250.0000 mg | ORAL_TABLET | Freq: Every day | ORAL | Status: DC
  Administered 2014-10-06 – 2014-10-07 (×2): 250 mg via ORAL
  Filled 2014-10-06 (×2): qty 1

## 2014-10-06 NOTE — Clinical Social Work Note (Signed)
Clinical Social Work Assessment  Patient Details  Name: [name] MRN: [id number] Date of Birth: 20-Jan-1935  Date of referral:  10/06/14               Reason for consult:  Facility Placement, Discharge Planning                Permission sought to share information with:  Case Manager, Facility Sport and exercise psychologist, PCP, Family Supports Permission granted to share information::  Yes, Verbal Permission Granted  Name::      Vernelle Emerald)  Agency::   (Yes, SNF's )  Relationship::   (Daughter )  Contact Information:   [phone])  Housing/Transportation Living arrangements for the past 2 months:  [place] of Information:  Adult Children Patient Interpreter Needed:  None Criminal Activity/Legal Involvement Pertinent to Current Situation/Hospitalization:  No - Comment as needed Significant Relationships:  Adult Children, Other Family Members Lives with:  Facility Resident Do you feel safe going back to the place where you live?  Yes Need for family participation in patient care:  No (Coment)  Care giving concerns:  Patient's daughter, Butch Penny concerned with patient continuously being admitted into SNF's from hospital. Concerned patient will permanently require a higher level of care.   Social Worker assessment / plan:  Holiday representative spoke with patient's daughter, Vernelle Emerald via telephone in reference to post-acute placement for SNF. CSW introduced CSW role and SNF process. Pt's dtr confirmed that patient is from [place] and has most recently been at [facility] for short-term rehab/24 hour care. Pt's dtr reported due to PT recommending SNF placement each time pt is admitted she is concerned pt now requires a higher level of care (SNF). Pt's dtr further requested to met with PT/OT to explained recommendations and discuss plans with family. Pt's dtr reported she plans to meet with family about transitioning pt to a long-term  higher level of care. Pt's dtr wishes for pt to return to [facility] for SNF. CSW to complete FL-2 and fax to SNF's in [facility]. No further questions/concerns reported by pt's family. CSW will continue to follow pt and pt's family for continued support and to facilitate pt's discharge needs once medically stable (Note: due to several admissions into SNF's patient may have used all of her skilled days that insurance will cover).  Employment status:  Retired Nurse, adult PT Recommendations:  [place], [place] / Referral to community resources:  [place]  Patient/Family's Response to care:  Pt disoriented at this time. Pt's dtr agreeable to SNF placement and considering transitioning pt to long-term SNF care. Pt's dtr accepting, pleasant and appreciated social work intervention.   Patient/Family's Understanding of and Emotional Response to Diagnosis, Current Treatment, and Prognosis:  Pt's dtr understands pt requires a higher level of care and that ALF cannot provide 24 hour assistance.   Emotional Assessment Appearance:  Appears stated age Attitude/Demeanor/Rapport:  Unable to Assess Affect (typically observed):  Unable to Assess Orientation:  Oriented to Self, Oriented to Place Alcohol / Substance use:  Never Used Psych involvement (Current and /or in the community):  No (Comment)  Discharge Needs  Concerns to be addressed:  No discharge needs identified Readmission within the last 30 days:  Yes Current discharge risk:  None Barriers to Discharge:  [place], [place], LCSW 10/06/2014, 12:31 PM

## 2014-10-06 NOTE — Progress Notes (Addendum)
[facility] Progress Note   TNIA ANGLADA RFF:[id number] DOB: 01-25-1935 DOA: 10/05/2014 PCP: Mayra Neer, MD  Brief narrative: [name] is a 80 y.o. female a past medical history of COPD, TIA, diabetes mellitus with neuropathy who lives in assisted living facility. She is brought to the hospital for dizziness and a tremor. When I evaluated her this morning, she told me she was brought to the hospital due to weakness. She was in the ER for weakness 2 days ago and was suspected to have a UTI. She was discharged with Keflex. She admitted on admission to having weakness and dizziness for 3 weeks. She states that she is essentially wheelchair bound but has been working on increasing her mobility with physical therapy.   Subjective: Evaluated this morning-complains of weakness. No other complaints of dizziness, shortness of breath, chest pain, nausea vomiting abdominal pain or diarrhea. She admitted to having a tremor yesterday in the ER but she states this is improving.  Assessment/Plan: Principal Problem:   Acute on chronic renal failure - A sliding creatinine of 1.8-1.9-noted to be 2.25 on 5/22 - Lasix is on hold and IV fluids are being given- creatinine slowly improving  Active Problems: Dizziness, near syncope- orthostatic hypotension - Patient had orthostatic vitals done this morning with a significant drop in blood pressure when standing-continue to hydrate  - also noted to be bradycardic-Coreg dose cut back -MRI obtained which is negative for an acute etiology for her symptoms -TSH and free T4 are normal -Follow up on carotid artery duplex  UTI - Even Keflex on 5/20-urine culture grew out greater than 100,000 colonies of multiple morphotypes -UA again noted to be positive on 5/22-will start on Levaquin- await repeat culture results  History of TIA - Continue aspirin at 325 mg daily and Crestor  Hypertension-   Bradycardia - Coreg decreased to 6.25 mg twice a day  instead of 25 mg twice a day as she is bradycardic -Continue amlodipine and clonidine for now    Hypothyroidism -Continue levothyroxine-thyroid functions normal    Tremor of both hands -Possibly essential tremor- gabapentin as it was suspected it could be causing her tremor in setting of renal failure     Diabetes mellitus without complication -J5T 8.1 - She takes Lantus and 3 times a day Humalog at home which have been continued     Anemia - Continue to follow hemoglobin with hydration- baseline appears to be 10-11    COPD (chronic obstructive pulmonary disease) -No exacerbation noted  Nicotine abuse - Continue nicotine patch    Wax in ear -Debrox ordered  Code Status: Full code Family Communication:  Disposition Plan: Physical therapy recommending SNF- follow-up on carotid Doppler and renal function DVT prophylaxis:  Heparin  Consultants: None  Procedures: 10/05/14 Left ventricle: The cavity size was normal. Wall thickness was normal. Systolic function was normal. The estimated ejection fraction was in the range of 60% to 65%. Wall motion was normal; there were no regional wall motion abnormalities. Findings consistent with left ventricular diastolic dysfunction. - Aortic valve: Mildly calcified annulus. Mildly thickened leaflets. - Mitral valve: There was mild regurgitation. - Left atrium: The atrium was mildly dilated. - Right atrium: The atrium was mildly to moderately dilated. - Tricuspid valve: There was mild-moderate regurgitation.  Antibiotics: Anti-infectives    None      Objective: Filed Weights   10/05/14 1108 10/05/14 1500 10/06/14 0439  Weight: 87.544 kg (193 lb) 90.493 kg (199 lb 8 oz) 90.299 kg (199 lb  1.2 oz)    Intake/Output Summary (Last 24 hours) at 10/06/14 1121 Last data filed at 10/06/14 0803  Gross per 24 hour  Intake    843 ml  Output      0 ml  Net    843 ml     Vitals Filed Vitals:   10/05/14 1500 10/05/14 2011  10/05/14 2235 10/06/14 0439  BP: 144/42 142/52  110/59  Pulse: 55 80 65 54  Temp: 97.9 F (36.6 C) 97.9 F (36.6 C)  97 F (36.1 C)  TempSrc: Oral Oral  Oral  Resp: 16  16 18   Height: 5\' 3"  (1.6 m)     Weight: 90.493 kg (199 lb 8 oz)   90.299 kg (199 lb 1.2 oz)  SpO2: 100%   96%    Exam:  General:  Pt is alert, not in acute distress  HEENT: No icterus, No thrush  Cardiovascular: regular rate and rhythm, S1/S2 No murmur  Respiratory: clear to auscultation bilaterally   Abdomen: Soft, +Bowel sounds, non tender, non distended, no guarding  MSK: No LE edema, cyanosis or clubbing  Data Reviewed: Basic Metabolic Panel:  Recent Labs Lab 10/03/14 1218 10/05/14 1123 10/06/14 0330  NA 140 139 140  K 4.8 4.8 4.5  CL 105 103 106  CO2 27 25 27   GLUCOSE 105* 116* 146*  BUN 35* 45* 44*  CREATININE 1.81* 2.25* 2.01*  CALCIUM 9.5 9.4 8.8*   Liver Function Tests:  Recent Labs Lab 10/03/14 1218 10/06/14 0330  AST 20 13*  ALT 14 12*  ALKPHOS 62 56  BILITOT 0.3 0.3  PROT 7.3 6.3*  ALBUMIN 3.5 2.9*   No results for input(s): LIPASE, AMYLASE in the last 168 hours. No results for input(s): AMMONIA in the last 168 hours. CBC:  Recent Labs Lab 10/03/14 1218 10/05/14 1123 10/06/14 0330  WBC 7.9 8.2 7.7  NEUTROABS 4.2 4.1  --   HGB 11.7* 10.5* 9.1*  HCT 38.0 33.9* 30.0*  MCV 92.2 92.9 92.6  PLT 194 215 211   Cardiac Enzymes:  Recent Labs Lab 10/03/14 1218  TROPONINI <0.03   BNP (last 3 results)  Recent Labs  08/10/14 0608 10/03/14 1217  BNP 170.7* 323.1*    ProBNP (last 3 results) No results for input(s): PROBNP in the last 8760 hours.  CBG:  Recent Labs Lab 10/03/14 1154 10/05/14 1638 10/05/14 2155 10/06/14 0723 10/06/14 1113  GLUCAP 90 142* 193* 114* 179*    Recent Results (from the past 240 hour(s))  Urine culture     Status: None   Collection Time: 10/03/14  1:58 PM  Result Value Ref Range Status   Specimen Description URINE, CLEAN  CATCH  Final   Special Requests NONE  Final   Colony Count   Final    >=100,000 COLONIES/ML Performed at [facility]    Culture   Final    Multiple bacterial morphotypes present, none predominant. Suggest appropriate recollection if clinically indicated. Performed at [facility]    Report Status 10/04/2014 FINAL  Final  MRSA PCR Screening     Status: None   Collection Time: 10/05/14  7:30 PM  Result Value Ref Range Status   MRSA by PCR NEGATIVE NEGATIVE Final    Comment:        The GeneXpert MRSA Assay (FDA approved for NASAL specimens only), is one component of a comprehensive MRSA colonization surveillance program. It is not intended to diagnose MRSA infection nor to guide or monitor treatment  for MRSA infections.      Studies: Mr Herby Abraham Contrast  10/05/2014   CLINICAL DATA:  Initial evaluation for acute dizziness. History of COPD, TIA, diabetes.  EXAM: MRI HEAD WITHOUT CONTRAST  TECHNIQUE: Multiplanar, multiecho pulse sequences of the brain and surrounding structures were obtained without intravenous contrast.  COMPARISON:  Prior CT from 10/03/2014 as well as MRI from 10/07/2013.  FINDINGS: Study is moderately degraded by motion artifact.  Diffuse prominence of the CSF containing spaces is compatible with generalized age-related cerebral atrophy. Moderate chronic small vessel ischemic changes present within the periventricular and deep white matter both cerebral hemispheres. Small vessel disease involves the pons as well.  No abnormal foci of restricted diffusion to suggest acute intracranial infarct identified. Gray-white matter differentiation maintained. Normal intravascular flow voids intact. No acute or chronic intracranial hemorrhage.  No mass lesion or midline shift. No mass effect. Ventricular prominence related to global parenchymal volume loss present without hydrocephalus. No extra-axial fluid collection.  Craniocervical junction within normal limits.  Pituitary gland unremarkable.  No acute abnormality about the orbits.  Mild mucosal thickening present within the maxillary sinuses and ethmoidal air cells. No air-fluid levels to suggest active sinus infection. No mastoid effusion. Inner ear structures grossly normal.  Bone marrow signal intensity within normal limits. Scalp soft tissues unremarkable.  IMPRESSION: 1. No acute intracranial infarct or other process identified. 2. Generalized cerebral atrophy with moderate to advanced chronic microvascular ischemic disease.   Electronically Signed   By: Jeannine Boga M.D.   On: 10/05/2014 22:24    Scheduled Meds:  Scheduled Meds: . amitriptyline  50 mg Oral QHS  . amLODipine  10 mg Oral QHS  . aspirin  325 mg Oral Daily  . B-complex with vitamin C  1 tablet Oral Daily  . carbamide peroxide  5 drop Left Ear BID  . carvedilol  6.25 mg Oral BID WC  . cloNIDine  0.2 mg Oral BID  . docusate sodium  100 mg Oral BID  . escitalopram  20 mg Oral Daily  . heparin  5,000 Units Subcutaneous 3 times per day  . hydrocerin  1 application Topical BID  . insulin aspart  0-20 Units Subcutaneous TID WC  . insulin aspart  0-5 Units Subcutaneous QHS  . insulin glargine  50 Units Subcutaneous QHS  . levothyroxine  50 mcg Oral QAC breakfast  . nicotine  7 mg Transdermal Daily  . pantoprazole  40 mg Oral Daily  . polyethylene glycol  17 g Oral Daily  . rosuvastatin  20 mg Oral QPM  . sodium chloride  3 mL Intravenous Q12H   Continuous Infusions: . sodium chloride 50 mL/hr (10/05/14 1750)    Time spent on care of this patient: 35 minutes   [place], MD 10/06/2014, 11:21 AM  LOS: 1 day   [facility] Office  [phone] Pager - Text Page per [email or web address]  If 7PM-7AM, please contact night-coverage [email or web address]

## 2014-10-06 NOTE — Progress Notes (Signed)
Occupational Therapy Evaluation Patient Details Name: [name] MRN: [id number] DOB: February 10, 1935 Today's Date: 10/06/2014    History of Present Illness Pt is a 80 y.o. female, with a past medical history of COPD, TIA, diabetes with neuropathy, who has lived at [facility] facility for a couple of years. She was brought to the ER with c/o weakness and dizziness. Pt admitted with dx of acute on chronic renal failure.   Clinical Impression   Patient min>mod assist,, living at a SNF PTA. Patient currently functioning at an overall mod>max assist level. Pt presents to the hospital with decreased activity tolerance/endurance and decreased overall weakness. Patient will benefit from acute OT to increase overall independence in the areas of ADLs, functional mobility, and overall safety in order to safely discharge back to SNF.     Follow Up Recommendations  SNF;Supervision/Assistance - 24 hour    Equipment Recommendations  None recommended by OT    Recommendations for Other Services  None at this time  Precautions / Restrictions Precautions Precautions: Fall Restrictions Weight Bearing Restrictions: No      Mobility Bed Mobility Overal bed mobility: Needs Assistance Bed Mobility: Supine to Sit;Sit to Supine     Supine to sit: Mod assist Sit to supine: Mod assist   General bed mobility comments: Assistance for trunk support, assist for management of BLEs, cues for safety/technique  Transfers General transfer comment: Did not occur    Balance Overall balance assessment: Needs assistance Sitting-balance support: Feet supported;Bilateral upper extremity supported Sitting balance-Leahy Scale: Poor   Postural control: Posterior lean    ADL Overall ADL's : Needs assistance/impaired General ADL Comments: Pt engaged in bed mobility and worked on sitting EOB, pt with decreased overall activity tolerance/endurance and required mod assist to sit EOB. Pt only  able to sit EOB for ~2 minutes with decreased balance and increased fatigue. According to pt, she was able to sit up EOB, unsupported without assistance PTA. Will continue to follow pt acutely and recommending continued SNF.     Pertinent Vitals/Pain Pain Assessment: No/denies pain     Hand Dominance Right   Extremity/Trunk Assessment Upper Extremity Assessment Upper Extremity Assessment: Generalized weakness   Lower Extremity Assessment Lower Extremity Assessment: Generalized weakness   Cervical / Trunk Assessment Cervical / Trunk Assessment: Normal   Communication Communication Communication: No difficulties   Cognition Arousal/Alertness: Lethargic Behavior During Therapy: WFL for tasks assessed/performed Overall Cognitive Status: No family/caregiver present to determine baseline cognitive functioning              Home Living Family/patient expects to be discharged to:: Skilled nursing facility Additional Comments: [facility] SN[facility]    Prior Functioning/Environment Level of Independence: Needs assistance  Gait / Transfers Assistance Needed: pt reports she is w/c bound ADL's / Homemaking Assistance Needed: Pt states she needs assistance with ADLs. She states she was getting PT/OT at [facility]    OT Diagnosis: Generalized weakness   OT Problem List: Decreased strength;Decreased activity tolerance;Impaired balance (sitting and/or standing);Decreased safety awareness;Decreased knowledge of use of DME or AE   OT Treatment/Interventions: Self-care/ADL training;Therapeutic exercise;Energy conservation;DME and/or AE instruction;Therapeutic activities;Patient/family education;Balance training    OT Goals(Current goals can be found in the care plan section) Acute Rehab OT Goals Patient Stated Goal: not stated OT Goal Formulation: With patient Time For Goal Achievement: 10/13/14 Potential to Achieve Goals: Good ADL Goals Pt Will Perform Grooming: with  supervision;sitting (unsupported EOB) Pt Will Perform Upper Body Bathing: with supervision;sitting (unsupported EOB)  Pt Will Perform Upper Body Dressing: with supervision;sitting (unsupported EOB) Pt Will Transfer to Toilet: with mod assist;bedside commode;stand pivot transfer Pt/caregiver will Perform Home Exercise Program: Increased strength;With written HEP provided;With Supervision (increase strength and overall activity tolerance/endurance)  OT Frequency: Min 2X/week   Barriers to D/C: None known at this time   End of Session Activity Tolerance: Patient limited by fatigue Patient left: in bed;with call bell/phone within reach;with nursing/sitter in room   Time: 1136-1151 OT Time Calculation (min): 15 min Charges:  OT General Charges $OT Visit: 1 Procedure OT Evaluation $Initial OT Evaluation Tier I: 1 Procedure  Matt Delpizzo , MS, OTR/L, CLT Pager: [phone]  10/06/2014, 12:52 PM

## 2014-10-06 NOTE — Evaluation (Signed)
Physical Therapy Evaluation Patient Details Name: [name] MRN: [id number] DOB: 1934/10/28 Today's Date: 10/06/2014   History of Present Illness  Pt is a 80 y.o. female, with a past medical history of COPD, TIA, diabetes with neuropathy, who has lived at [facility] facility for a couple of years. She was brought to the ER with c/o weakness and dizziness. Pt admitted with dx of acute on chronic renal failure.  Clinical Impression  Pt admitted with above diagnosis. Pt currently with functional limitations due to the deficits listed below (see PT Problem List).  Pt will benefit from skilled PT to increase their independence and safety with mobility to allow discharge to the venue listed below.       Follow Up Recommendations SNF;Supervision/Assistance - 24 hour    Equipment Recommendations  None recommended by PT    Recommendations for Other Services       Precautions / Restrictions Precautions Precautions: Fall      Mobility  Bed Mobility Overal bed mobility: Needs Assistance Bed Mobility: Supine to Sit;Sit to Supine     Supine to sit: Mod assist Sit to supine: Mod assist   General bed mobility comments: verbal cues for sequencing  Transfers                 General transfer comment: Pt reports dizziness sitting EOB requiring return to supine.  Ambulation/Gait             General Gait Details: Per pt, she is w/c bound.  Stairs            Wheelchair Mobility    Modified Rankin (Stroke Patients Only)       Balance Overall balance assessment: Needs assistance Sitting-balance support: Bilateral upper extremity supported;Feet unsupported Sitting balance-Leahy Scale: Poor   Postural control: Posterior lean                                   Pertinent Vitals/Pain Pain Assessment: No/denies pain    Home Living Family/patient expects to be discharged to:: Skilled nursing facility                     Prior Function Level of Independence: Needs assistance   Gait / Transfers Assistance Needed: pt reports she is w/c bound           Hand Dominance   Dominant Hand: Right    Extremity/Trunk Assessment   Upper Extremity Assessment: Generalized weakness           Lower Extremity Assessment: Generalized weakness         Communication   Communication: No difficulties  Cognition Arousal/Alertness: Awake/alert Behavior During Therapy: WFL for tasks assessed/performed Overall Cognitive Status: No family/caregiver present to determine baseline cognitive functioning                      General Comments      Exercises        Assessment/Plan    PT Assessment Patient needs continued PT services  PT Diagnosis Generalized weakness   PT Problem List Decreased strength;Decreased activity tolerance;Decreased balance;Decreased mobility;Decreased knowledge of precautions;Decreased safety awareness  PT Treatment Interventions Functional mobility training;Therapeutic activities;Therapeutic exercise;Patient/family education;Balance training   PT Goals (Current goals can be found in the Care Plan section) Acute Rehab PT Goals Patient Stated Goal: not stated PT Goal Formulation: With patient Time For Goal Achievement: 10/20/14  Potential to Achieve Goals: Fair    Frequency Min 2X/week   Barriers to discharge        Co-evaluation               End of Session   Activity Tolerance: Treatment limited secondary to medical complications (Comment) (dizziness) Patient left: in bed;with call bell/phone within reach;with bed alarm set Nurse Communication: Mobility status         Time: 1020-1035 PT Time Calculation (min) (ACUTE ONLY): 15 min   Charges:   PT Evaluation $Initial PT Evaluation Tier I: 1 Procedure     PT G CodesLorriane Shire 10/06/2014, 10:41 AM

## 2014-10-07 ENCOUNTER — Inpatient Hospital Stay (HOSPITAL_COMMUNITY): Payer: Medicare HMO

## 2014-10-07 DIAGNOSIS — R1013 Epigastric pain: Secondary | ICD-10-CM

## 2014-10-07 DIAGNOSIS — R42 Dizziness and giddiness: Secondary | ICD-10-CM

## 2014-10-07 DIAGNOSIS — E039 Hypothyroidism, unspecified: Secondary | ICD-10-CM

## 2014-10-07 LAB — HEPATIC FUNCTION PANEL
ALT: 12 U/L — ABNORMAL LOW (ref 14–54)
AST: 17 U/L (ref 15–41)
Albumin: 3 g/dL — ABNORMAL LOW (ref 3.5–5.0)
Alkaline Phosphatase: 61 U/L (ref 38–126)
Bilirubin, Direct: 0.2 mg/dL (ref 0.1–0.5)
Indirect Bilirubin: 0.4 mg/dL (ref 0.3–0.9)
TOTAL PROTEIN: 6.1 g/dL — AB (ref 6.5–8.1)
Total Bilirubin: 0.6 mg/dL (ref 0.3–1.2)

## 2014-10-07 LAB — BASIC METABOLIC PANEL
Anion gap: 10 (ref 5–15)
BUN: 35 mg/dL — ABNORMAL HIGH (ref 6–20)
CO2: 24 mmol/L (ref 22–32)
Calcium: 9.5 mg/dL (ref 8.9–10.3)
Chloride: 109 mmol/L (ref 101–111)
Creatinine, Ser: 1.79 mg/dL — ABNORMAL HIGH (ref 0.44–1.00)
GFR, EST AFRICAN AMERICAN: 30 mL/min — AB (ref >60)
GFR, EST NON AFRICAN AMERICAN: 26 mL/min — AB (ref >60)
Glucose, Bld: 190 mg/dL — ABNORMAL HIGH (ref 65–99)
Potassium: 4.9 mmol/L (ref 3.5–5.1)
Sodium: 143 mmol/L (ref 135–145)

## 2014-10-07 LAB — URINE CULTURE
Colony Count: 80000
Special Requests: NORMAL

## 2014-10-07 LAB — GLUCOSE, CAPILLARY
GLUCOSE-CAPILLARY: 148 mg/dL — AB (ref 65–99)
Glucose-Capillary: 213 mg/dL — ABNORMAL HIGH (ref 65–99)
Glucose-Capillary: 197 mg/dL — ABNORMAL HIGH (ref 65–99)
Glucose-Capillary: 110 mg/dL — ABNORMAL HIGH (ref 65–99)

## 2014-10-07 LAB — LIPASE, BLOOD

## 2014-10-07 MED ORDER — POLYETHYLENE GLYCOL 3350 17 G PO PACK: 17.0000 g | PACK | Freq: Four times a day (QID) | ORAL | Status: DC

## 2014-10-07 MED ORDER — INSULIN ASPART 100 UNIT/ML ~~LOC~~ SOLN
3.0000 [IU] | Freq: Once | SUBCUTANEOUS | Status: AC
  Administered 2014-10-07: 3 [IU] via SUBCUTANEOUS

## 2014-10-07 MED ORDER — HYDRALAZINE HCL 10 MG PO TABS
10.0000 mg | ORAL_TABLET | Freq: Three times a day (TID) | ORAL | Status: DC
  Administered 2014-10-07 – 2014-10-08 (×3): 10 mg via ORAL
  Filled 2014-10-07 (×4): qty 1

## 2014-10-07 MED ORDER — ALPRAZOLAM 0.25 MG PO TABS: 0.2500 mg | ORAL_TABLET | Freq: Two times a day (BID) | ORAL | Status: DC | PRN

## 2014-10-07 MED ORDER — LINEZOLID 600 MG PO TABS
600.0000 mg | ORAL_TABLET | Freq: Two times a day (BID) | ORAL | Status: DC
  Administered 2014-10-07 – 2014-10-08 (×3): 600 mg via ORAL
  Filled 2014-10-07 (×4): qty 1

## 2014-10-07 NOTE — Care Management Note (Signed)
Case Management Note  Patient Details  Name: [name] MRN: [id number] Date of Birth: Sep 30, 1934  Subjective/Objective:    Pt admitted from [facility]. Pt is wheelchair bound.                 Action/Plan: CM will continue to monitor for additional disposition needs.   Expected Discharge Date:                  Expected Discharge Plan:  Assisted Living / Rest Home  In-House Referral:  Clinical Social Work  Discharge planning Services  CM Consult  Post Acute Care Choice:    Choice offered to:     DME Arranged:    DME Agency:     HH Arranged:    [place] Agency:     Status of Service:     Medicare Important Message Given:    Date Medicare IM Given:    Medicare IM give by:    Date Additional Medicare IM Given:    Additional Medicare Important Message give by:     If discussed at [place] of Stay Meetings, dates discussed:    Additional Comments:  [name], RN 10/07/2014, 3:38 PM

## 2014-10-07 NOTE — Clinical Social Work Placement (Signed)
   CLINICAL SOCIAL WORK PLACEMENT  NOTE  Date:  10/07/2014  Patient Details  Name: [name] MRN: [id number] Date of Birth: 05-20-1934  Clinical Social Work is seeking post-discharge placement for this patient at the [place] level of care (*CSW will initial, date and re-position this form in  chart as items are completed):  Yes   Patient/family provided with [place] Work Department's list of facilities offering this level of care within the geographic area requested by the patient (or if unable, by the patient's family).  Yes   Patient/family informed of their freedom to choose among providers that offer the needed level of care, that participate in Medicare, Medicaid or managed care program needed by the patient, have an available bed and are willing to accept the patient.  Yes   Patient/family informed of [place]'s ownership interest in [facility] and [facility], as well as of the fact that they are under no obligation to receive care at these facilities.  PASRR submitted to EDS on       PASRR number received on       Existing PASRR number confirmed on 10/07/14     FL2 transmitted to all facilities in geographic area requested by pt/family on 10/07/14     FL2 transmitted to all facilities within larger geographic area on       Patient informed that his/her managed care company has contracts with or will negotiate with certain facilities, including the following:            Patient/family informed of bed offers received.  Patient chooses bed at       Physician recommends and patient chooses bed at      Patient to be transferred to   on  .  Patient to be transferred to facility by       Patient family notified on   of transfer.  Name of family member notified:        PHYSICIAN Please sign FL2     Additional Comment:    _______________________________________________ Rozell Searing, LCSW 10/07/2014, 9:57  AM

## 2014-10-07 NOTE — Progress Notes (Signed)
Pt states she wanted to use [facility].  Attempted to have pt sit on side of bed.  Was only sitting up for about 20 seconds when pt became too weak and began to fall back in the bed.  Pt stated she became very dizzy.  We then assisted her to lay back in bed.  Two staff members from [facility] at bedside.

## 2014-10-07 NOTE — Progress Notes (Signed)
[facility] Progress Note   SATONYA LUX KJZ:[id number] DOB: 1934-06-03 DOA: 10/05/2014 PCP: Mayra Neer, MD  Brief narrative: [name] is a 80 y.o. female a past medical history of COPD, TIA, diabetes mellitus with neuropathy who lives in assisted living facility. She is brought to the hospital for dizziness and a tremor. When I evaluated her this morning, she told me she was brought to the hospital due to weakness. She was in the ER for weakness 2 days ago and was suspected to have a UTI. She was discharged with Keflex. She admitted on admission to having weakness and dizziness for 3 weeks. She states that she is essentially wheelchair bound but has been working on increasing her mobility with physical therapy.   Subjective: Evaluated this morning-complains of weakness. No other complaints of dizziness, shortness of breath, chest pain, nausea vomiting abdominal pain or diarrhea. She admitted to having a tremor yesterday in the ER but she states this is improving.  Assessment/Plan: Principal Problem:   Acute on chronic renal failure - A sliding creatinine of 1.8-1.9-noted to be 2.25 on 5/22 - Lasix is on hold and IV fluids are being given- creatinine improved to baseline today  Active Problems: Dizziness, near syncope- orthostatic hypotension - improved with hydration - also noted to be bradycardic-Coreg dose cut back- HR improved to 70-80s -MRI obtained which is negative for an acute etiology for her symptoms -TSH and free T4 are normal -Carotid artery duplex- reveals 40-59% right internal carotid stenosis and 1-39% left internal carotid stenosis-vertebral arteries are patent and flow  Abdominal pain - is not able to eat today due to abdominal pain- Xray of the abdomen reveals stool in right colon and otherwise no abnormalities- will give a laxative- exam reveals mild tenderness in epigastrium- will obtain Lipase and LFTs- no nausea or vomiting- tolerating clears-  already on a daily PPI  UTI - Even Keflex on 5/20-urine culture grew out greater than 100,000 colonies of multiple morphotypes -Urine culture positive for enterococcus with 80,000 colonies-she is allergic to amoxicillin-we'll start Zyvox and treat for 7 days  History of TIA - Continue aspirin at 325 mg daily and Crestor  Hypertension-   Bradycardia - Coreg decreased to 6.25 mg twice a day instead of 25 mg twice a day as she is bradycardic -Continue amlodipine and clonidine for now    Hypothyroidism -Continue levothyroxine-thyroid functions normal    Tremor of both hands -Possibly essential tremor- gabapentin as it was suspected it could be causing her tremor in setting of renal failure     Diabetes mellitus without complication -X4I 8.1 - She takes Lantus and 3 times a day Humalog at home which have been continued     Anemia - Continue to follow hemoglobin with hydration- baseline appears to be 10-11    COPD (chronic obstructive pulmonary disease) -No exacerbation noted  Nicotine abuse - Continue nicotine patch    Wax in ear -Debrox ordered  Code Status: Full code Family Communication:  Disposition Plan: Physical therapy recommending SNF-family requesting she go back to assisted living or now DVT prophylaxis:  Heparin  Consultants: None  Procedures: 10/05/14 Left ventricle: The cavity size was normal. Wall thickness was normal. Systolic function was normal. The estimated ejection fraction was in the range of 60% to 65%. Wall motion was normal; there were no regional wall motion abnormalities. Findings consistent with left ventricular diastolic dysfunction. - Aortic valve: Mildly calcified annulus. Mildly thickened leaflets. - Mitral valve: There was mild regurgitation. -  Left atrium: The atrium was mildly dilated. - Right atrium: The atrium was mildly to moderately dilated. - Tricuspid valve: There was mild-moderate  regurgitation.  Antibiotics: Anti-infectives    Start     Dose/Rate Route Frequency Ordered Stop   10/07/14 1000  linezolid (ZYVOX) tablet 600 mg     600 mg Oral Every 12 hours 10/07/14 0937     10/06/14 1415  levofloxacin (LEVAQUIN) tablet 250 mg  Status:  Discontinued     250 mg Oral Daily 10/06/14 1356 10/07/14 1412      Objective: Filed Weights   10/05/14 1500 10/06/14 0439 10/07/14 0531  Weight: 90.493 kg (199 lb 8 oz) 90.299 kg (199 lb 1.2 oz) 92.534 kg (204 lb)    Intake/Output Summary (Last 24 hours) at 10/07/14 1509 Last data filed at 10/07/14 0700  Gross per 24 hour  Intake    600 ml  Output      0 ml  Net    600 ml     Vitals Filed Vitals:   10/06/14 2029 10/06/14 2231 10/07/14 0531 10/07/14 0915  BP: 158/40  172/47 157/43  Pulse: 64  80   Temp: 98.7 F (37.1 C)  98 F (36.7 C)   TempSrc: Oral  Oral   Resp: 18  18   Height:      Weight:   92.534 kg (204 lb)   SpO2: 92% 95% 92%     Exam:  General:  Pt is alert, not in acute distress  HEENT: No icterus, No thrush  Cardiovascular: regular rate and rhythm, S1/S2 No murmur  Respiratory: clear to auscultation bilaterally   Abdomen: Soft, +Bowel sounds, mild epigastric tenderness, non distended, no guarding  MSK: No LE edema, cyanosis or clubbing  Data Reviewed: Basic Metabolic Panel:  Recent Labs Lab 10/03/14 1218 10/05/14 1123 10/06/14 0330 10/07/14 0346  NA 140 139 140 143  K 4.8 4.8 4.5 4.9  CL 105 103 106 109  CO2 27 25 27 24   GLUCOSE 105* 116* 146* 190*  BUN 35* 45* 44* 35*  CREATININE 1.81* 2.25* 2.01* 1.79*  CALCIUM 9.5 9.4 8.8* 9.5   Liver Function Tests:  Recent Labs Lab 10/03/14 1218 10/06/14 0330  AST 20 13*  ALT 14 12*  ALKPHOS 62 56  BILITOT 0.3 0.3  PROT 7.3 6.3*  ALBUMIN 3.5 2.9*   No results for input(s): LIPASE, AMYLASE in the last 168 hours. No results for input(s): AMMONIA in the last 168 hours. CBC:  Recent Labs Lab 10/03/14 1218 10/05/14 1123  10/06/14 0330  WBC 7.9 8.2 7.7  NEUTROABS 4.2 4.1  --   HGB 11.7* 10.5* 9.1*  HCT 38.0 33.9* 30.0*  MCV 92.2 92.9 92.6  PLT 194 215 211   Cardiac Enzymes:  Recent Labs Lab 10/03/14 1218  TROPONINI <0.03   BNP (last 3 results)  Recent Labs  08/10/14 0608 10/03/14 1217  BNP 170.7* 323.1*    ProBNP (last 3 results) No results for input(s): PROBNP in the last 8760 hours.  CBG:  Recent Labs Lab 10/06/14 1113 10/06/14 1615 10/06/14 2109 10/07/14 0720 10/07/14 1106  GLUCAP 179* 165* 166* 213* 197*    Recent Results (from the past 240 hour(s))  Urine culture     Status: None   Collection Time: 10/03/14  1:58 PM  Result Value Ref Range Status   Specimen Description URINE, CLEAN CATCH  Final   Special Requests NONE  Final   Colony Count   Final    >=  100,000 COLONIES/ML Performed at [facility]   Final    Multiple bacterial morphotypes present, none predominant. Suggest appropriate recollection if clinically indicated. Performed at [facility]    Report Status 10/04/2014 FINAL  Final  Urine culture     Status: None   Collection Time: 10/05/14 11:52 AM  Result Value Ref Range Status   Specimen Description URINE, CATHETERIZED  Final   Special Requests Normal  Final   Colony Count   Final    80,000 COLONIES/ML Performed at [facility]    Culture   Final    ENTEROCOCCUS SPECIES Performed at [facility]    Report Status 10/07/2014 FINAL  Final   Organism ID, Bacteria ENTEROCOCCUS SPECIES  Final      Susceptibility   Enterococcus species - MIC*    AMPICILLIN <=2 SENSITIVE Sensitive     LEVOFLOXACIN >=8 RESISTANT Resistant     NITROFURANTOIN <=16 SENSITIVE Sensitive     VANCOMYCIN 1 SENSITIVE Sensitive     TETRACYCLINE >=16 RESISTANT Resistant     * ENTEROCOCCUS SPECIES  MRSA PCR Screening     Status: None   Collection Time: 10/05/14  7:30 PM  Result Value Ref Range Status   MRSA by PCR NEGATIVE NEGATIVE  Final    Comment:        The GeneXpert MRSA Assay (FDA approved for NASAL specimens only), is one component of a comprehensive MRSA colonization surveillance program. It is not intended to diagnose MRSA infection nor to guide or monitor treatment for MRSA infections.      Studies: Mr Herby Abraham Contrast  10/05/2014   CLINICAL DATA:  Initial evaluation for acute dizziness. History of COPD, TIA, diabetes.  EXAM: MRI HEAD WITHOUT CONTRAST  TECHNIQUE: Multiplanar, multiecho pulse sequences of the brain and surrounding structures were obtained without intravenous contrast.  COMPARISON:  Prior CT from 10/03/2014 as well as MRI from 10/07/2013.  FINDINGS: Study is moderately degraded by motion artifact.  Diffuse prominence of the CSF containing spaces is compatible with generalized age-related cerebral atrophy. Moderate chronic small vessel ischemic changes present within the periventricular and deep white matter both cerebral hemispheres. Small vessel disease involves the pons as well.  No abnormal foci of restricted diffusion to suggest acute intracranial infarct identified. Gray-white matter differentiation maintained. Normal intravascular flow voids intact. No acute or chronic intracranial hemorrhage.  No mass lesion or midline shift. No mass effect. Ventricular prominence related to global parenchymal volume loss present without hydrocephalus. No extra-axial fluid collection.  Craniocervical junction within normal limits. Pituitary gland unremarkable.  No acute abnormality about the orbits.  Mild mucosal thickening present within the maxillary sinuses and ethmoidal air cells. No air-fluid levels to suggest active sinus infection. No mastoid effusion. Inner ear structures grossly normal.  Bone marrow signal intensity within normal limits. Scalp soft tissues unremarkable.  IMPRESSION: 1. No acute intracranial infarct or other process identified. 2. Generalized cerebral atrophy with moderate to advanced  chronic microvascular ischemic disease.   Electronically Signed   By: Jeannine Boga M.D.   On: 10/05/2014 22:24   Dg Abd Portable 1v  10/07/2014   CLINICAL DATA:  Right lower abdominal pain for 2 days  EXAM: PORTABLE ABDOMEN - 1 VIEW  COMPARISON:  None.  FINDINGS: Scattered large and small bowel gas is noted. No abnormal mass or abnormal calcifications are seen. Calcified injection granulomas are noted within the right buttocks. Postsurgical changes are seen. No acute bony abnormality is  noted.  IMPRESSION: No acute abnormality seen.   Electronically Signed   By: Inez Catalina M.D.   On: 10/07/2014 14:45    Scheduled Meds:  Scheduled Meds: . amLODipine  10 mg Oral QHS  . aspirin  325 mg Oral Daily  . B-complex with vitamin C  1 tablet Oral Daily  . carbamide peroxide  5 drop Left Ear BID  . carvedilol  6.25 mg Oral BID WC  . cloNIDine  0.2 mg Oral BID  . docusate sodium  100 mg Oral BID  . heparin  5,000 Units Subcutaneous 3 times per day  . hydrALAZINE  10 mg Oral 3 times per day  . hydrocerin  1 application Topical BID  . insulin aspart  0-20 Units Subcutaneous TID WC  . insulin aspart  0-5 Units Subcutaneous QHS  . insulin glargine  50 Units Subcutaneous QHS  . levothyroxine  50 mcg Oral QAC breakfast  . linezolid  600 mg Oral Q12H  . nicotine  7 mg Transdermal Daily  . pantoprazole  40 mg Oral Daily  . polyethylene glycol  17 g Oral Daily  . rosuvastatin  20 mg Oral QPM  . sodium chloride  3 mL Intravenous Q12H   Continuous Infusions:    Time spent on care of this patient: 35 minutes   Kennard, MD 10/07/2014, 3:09 PM  LOS: 2 days   [facility] Office  [phone] Pager - Text Page per [email or web address]  If 7PM-7AM, please contact night-coverage [email or web address]

## 2014-10-07 NOTE — Clinical Social Work Note (Addendum)
Clinical Social Worker spoke with pt's daughter, Butch Penny in reference to discharge planning. CSW presented long-term care options and assessed if family could afford to pay privately. Pt's dtr reported family is unable to pay privately at [facility]. CSW presented long-term care options [facility]) and dtr declined. Pt's dtr requesting that pt return to [facility] and placed long-term from there. CSW informed pt's dtr that facility would have to assess pt and determine if she is appropriate for return.  CSW contacted and faxed clinicals to [facility] for review. CSW awaiting a returned phone call from facility.   FL-2 completed and faxed to SNF's in [place] for long-term placement.   Glendon Axe, MSW, LCSWA [phone] 10/07/2014 12:29 PM

## 2014-10-07 NOTE — Progress Notes (Signed)
RN asking patient about why she was in the hospital.  Patient first replied "I guess it's for my breathing".  RN then asked patient if she had been feeling dizzy prior to admission and patient stated yes.  RN explained to patient that was the reason she had come to the hospital.  Per day shift RN report, patient had intermittent confusion on day shift and representatives from patient's assisted living facility had been to visit patient today and per the facility representatives patient can have intermittent confusion when she has a urinary tract infection.  Patient currently on oral antibiotics (being treated for a urinary tract infection).

## 2014-10-07 NOTE — Progress Notes (Signed)
UR Completed Marcy Sookdeo Graves-Bigelow, RN,BSN [phone]  

## 2014-10-07 NOTE — Progress Notes (Signed)
*  PRELIMINARY RESULTS* Vascular Ultrasound Carotid Duplex (Doppler) has been completed.   Findings suggest 40-59% right internal carotid artery stenosis and 1-39% left internal carotid artery stenosis. Vertebral arteries are patent with antegrade flow.  10/07/2014 10:57 AM Maudry Mayhew, RVT, RDCS, RDMS

## 2014-10-08 DIAGNOSIS — N390 Urinary tract infection, site not specified: Secondary | ICD-10-CM

## 2014-10-08 LAB — GLUCOSE, CAPILLARY
GLUCOSE-CAPILLARY: 133 mg/dL — AB (ref 65–99)
Glucose-Capillary: 92 mg/dL (ref 65–99)

## 2014-10-08 MED ORDER — FAMOTIDINE IN NACL 20-0.9 MG/50ML-% IV SOLN
20.0000 mg | Freq: Two times a day (BID) | INTRAVENOUS | Status: DC
  Administered 2014-10-08: 20 mg via INTRAVENOUS
  Filled 2014-10-08: qty 50

## 2014-10-08 MED ORDER — LINEZOLID 600 MG PO TABS: 600.0000 mg | ORAL_TABLET | Freq: Two times a day (BID) | ORAL | Status: DC

## 2014-10-08 MED ORDER — GI COCKTAIL ~~LOC~~: 30.0000 mL | Freq: Three times a day (TID) | ORAL | Status: DC | PRN

## 2014-10-08 MED ORDER — ALPRAZOLAM 0.25 MG PO TABS: 0.2500 mg | ORAL_TABLET | Freq: Two times a day (BID) | ORAL | Status: DC | PRN

## 2014-10-08 MED ORDER — ONDANSETRON HCL 4 MG/2ML IJ SOLN
4.0000 mg | Freq: Four times a day (QID) | INTRAMUSCULAR | Status: DC | PRN
  Administered 2014-10-08: 4 mg via INTRAVENOUS
  Filled 2014-10-08: qty 2

## 2014-10-08 MED ORDER — BISACODYL 10 MG RE SUPP
10.0000 mg | Freq: Once | RECTAL | Status: AC
  Administered 2014-10-08: 10 mg via RECTAL
  Filled 2014-10-08: qty 1

## 2014-10-08 MED ORDER — GI COCKTAIL ~~LOC~~
30.0000 mL | Freq: Once | ORAL | Status: AC
  Administered 2014-10-08: 30 mL via ORAL
  Filled 2014-10-08: qty 30

## 2014-10-08 MED ORDER — FAMOTIDINE 20 MG PO TABS: 20.0000 mg | ORAL_TABLET | Freq: Two times a day (BID) | ORAL | Status: DC | PRN

## 2014-10-08 MED ORDER — HYDROCODONE-ACETAMINOPHEN 5-325 MG PO TABS: ORAL_TABLET | ORAL | Status: DC

## 2014-10-08 NOTE — Clinical Social Work Note (Signed)
.  Clinical Social Worker facilitated patient discharge including contacting patient family and facility to confirm patient discharge plans.  Clinical information faxed to facility and family agreeable with plan.  CSW arranged ambulance transport via [facility] to [facility].  RN to call report prior to discharge.  DC packet prepared and on chart for transport with number to call report.  Clinical Social Worker will sign off for now as social work intervention is no longer needed. Please consult [place] again if new need arises.  Glendon Axe, MSW, LCSWA [phone] 10/08/2014 2:40 PM

## 2014-10-08 NOTE — Discharge Summary (Signed)
Physician Discharge Summary  [name]:[id number] DOB: August 18, 1934 DOA: 10/05/2014  PCP: Mayra Neer, MD  Admit date: 10/05/2014 Discharge date: 10/08/2014  Time spent: 50 minutes  Recommendations for Outpatient Follow-up:  1. Stop Linezolid on 5/30   Discharge Condition: stable Diet recommendation: diabetic, low sodium, heart healthy  Discharge Diagnoses:  Principal Problem:   Near syncope Active Problems:   Hypothyroidism   Diabetes mellitus without complication   Anemia   COPD (chronic obstructive pulmonary disease)   Acute on chronic renal failure   Tremor of both hands   Bradycardia   Wax in ear Enterococcus UTI  History of present illness:  [name] is a 80 y.o. female a past medical history of COPD, TIA, diabetes mellitus with neuropathy who lives in assisted living facility. She is brought to the hospital for dizziness and a tremor. When I evaluated her this morning, she told me she was brought to the hospital due to weakness. She was in the ER for weakness 2 days ago and was suspected to have a UTI. She was discharged with Keflex. She admitted on admission to having weakness and dizziness for 3 weeks. She states that she is essentially wheelchair bound but has been working on increasing her mobility with physical therapy.  Hospital Course:  Principal Problem:  Acute on chronic renal failure - Baseline creatinine of 1.8-1.9-noted to be 2.25 on 5/22 - Lasix is on hold and IV fluids are being given- creatinine improved to baseline  -with her poor PO intake,  will recommend Lasix be used only PRN for fluid overload- will d/c for now  Active Problems: Dizziness, near syncope- orthostatic hypotension - improved with hydration - also noted to be bradycardic-Coreg dose cut back- HR improved to 70-80s -MRI obtained which is negative for an acute etiology for her symptoms -TSH and free T4 are normal -Carotid artery duplex- reveals 40-59% right internal  carotid stenosis and 1-39% left internal carotid stenosis-vertebral arteries are patent and flow  Abdominal pain - starting 5/24- Lipase and LFTs normal- no nausea or vomiting- already on a daily PPI- added Gi cocktail and Pepcid which helped resolve her discomfort- recommend PRN Pepcid and GI cocktail at nursing facility  UTI- with mild confusion - Even Keflex on 5/20-urine culture grew out greater than 100,000 colonies of multiple morphotypes -Urine culture positive for enterococcus with 80,000 colonies sensitive to Ampicillin -she is allergic to penicillin-started  Zyvox and treat for 7 days  History of TIA - Continue aspirin at 325 mg daily and Crestor  Hypertension- Bradycardia - Coreg decreased to 6.25 mg twice a day instead of 25 mg twice a day as she is bradycardic -Continue amlodipine and clonidine for now   Hypothyroidism -Continue levothyroxine-thyroid functions normal   Tremor of both hands -Possibly essential tremor- gabapentin as it was suspected it could be causing her tremor in setting of renal failure    Diabetes mellitus without complication -N5A 8.1 - She takes Lantus and 3 times a day Humalog at home which have been continued    Anemia - Continue to follow hemoglobin with hydration- baseline appears to be 10-11   COPD (chronic obstructive pulmonary disease) -No exacerbation noted  Nicotine abuse - Continue nicotine patch   Wax in ear -Debrox ordered   Procedures: 10/05/14 Left ventricle: The cavity size was normal. Wall thickness was normal. Systolic function was normal. The estimated ejection fraction was in the range of 60% to 65%. Wall motion was normal; there were no regional wall  motion abnormalities. Findings consistent with left ventricular diastolic dysfunction. - Aortic valve: Mildly calcified annulus. Mildly thickened leaflets. - Mitral valve: There was mild regurgitation. - Left atrium: The atrium was mildly dilated. -  Right atrium: The atrium was mildly to moderately dilated. - Tricuspid valve: There was mild-moderate regurgitation.   Discharge Exam: Filed Weights   10/06/14 2536 10/07/14 0531 10/08/14 0349  Weight: 90.299 kg (199 lb 1.2 oz) 92.534 kg (204 lb) 89.903 kg (198 lb 3.2 oz)   Filed Vitals:   10/08/14 1359  BP: 138/41  Pulse: 59  Temp: 98.8 F (37.1 C)  Resp: 19    General: AAO x 3, no distress Cardiovascular: RRR, no murmurs  Respiratory: clear to auscultation bilaterally GI: soft, non-tender, non-distended, bowel sound positive  Discharge Instructions You were cared for by a hospitalist during your hospital stay. If you have any questions about your discharge medications or the care you received while you were in the hospital after you are discharged, you can call the unit and asked to speak with the hospitalist on call if the hospitalist that took care of you is not available. Once you are discharged, your primary care physician will handle any further medical issues. Please note that NO REFILLS for any discharge medications will be authorized once you are discharged, as it is imperative that you return to your primary care physician (or establish a relationship with a primary care physician if you do not have one) for your aftercare needs so that they can reassess your need for medications and monitor your lab values.      Discharge Instructions    Discharge instructions    Complete by:  As directed   Diabetic, low sodium, heart healthy diet     Increase activity slowly    Complete by:  As directed             Medication List    STOP taking these medications        cephALEXin 500 MG capsule  Commonly known as:  KEFLEX     furosemide 40 MG tablet  Commonly known as:  LASIX      TAKE these medications        acetaminophen 500 MG tablet  Commonly known as:  TYLENOL  Take 500 mg by mouth 3 (three) times daily. 8am 12pm, 4pm     albuterol (2.5 MG/3ML) 0.083%  nebulizer solution  Commonly known as:  PROVENTIL  Take 2.5 mg by nebulization every 6 (six) hours as needed for wheezing.     ALPRAZolam 0.25 MG tablet  Commonly known as:  XANAX  Take 1 tablet (0.25 mg total) by mouth 2 (two) times daily as needed for anxiety.     alum & mag hydroxide-simeth 200-200-20 MG/5ML suspension  Commonly known as:  MAALOX/MYLANTA  Take 30 mLs by mouth every 6 (six) hours as needed for indigestion or heartburn. Do not exceed 4 doses in 24 hours     amitriptyline 50 MG tablet  Commonly known as:  ELAVIL  Take 50 mg by mouth at bedtime.     amLODipine 10 MG tablet  Commonly known as:  NORVASC  Take 10 mg by mouth at bedtime.     aspirin 81 MG chewable tablet  Chew 81 mg by mouth daily.     B-complex with vitamin C tablet  Take 1 tablet by mouth daily.     bisacodyl 10 MG suppository  Commonly known as:  DULCOLAX  Place 10 mg  rectally as needed for moderate constipation.     carvedilol 25 MG tablet  Commonly known as:  COREG  Take 25 mg by mouth 2 (two) times daily with a meal. 0800 and 1700     cloNIDine 0.2 MG tablet  Commonly known as:  CATAPRES  Take 0.2 mg by mouth 2 (two) times daily. Hold if blood pressure less than 100/60     Cranberry 500 MG Caps  Take 1,000 mg by mouth 2 (two) times daily.     docusate sodium 100 MG capsule  Commonly known as:  COLACE  Take 1 capsule (100 mg total) by mouth every 12 (twelve) hours.     escitalopram 20 MG tablet  Commonly known as:  LEXAPRO  Take 20 mg by mouth daily.     famotidine 20 MG tablet  Commonly known as:  PEPCID  Take 1 tablet (20 mg total) by mouth 2 (two) times daily as needed for heartburn or indigestion.     gabapentin 300 MG capsule  Commonly known as:  NEURONTIN  Take 1 capsule (300 mg total) by mouth 2 (two) times daily.     gi cocktail Susp suspension  Take 30 mLs by mouth 3 (three) times daily as needed for indigestion. Shake well.     guaiFENesin 100 MG/5ML liquid   Commonly known as:  ROBITUSSIN  Take 100 mg by mouth every 6 (six) hours as needed for cough. Not to exceed 4 doses in 24 hours     HYDROcodone-acetaminophen 5-325 MG per tablet  Commonly known as:  NORCO/VICODIN  1 by mouth every 6 hours as needed for moderate pain, 2 by mouth every 6 hours as needed for sever pain DO NOT EXCEED 4 GM OF APAP IN 24 HOURS     insulin glargine 100 UNIT/ML injection  Commonly known as:  LANTUS  Inject 0.4 mLs (40 Units total) into the skin daily.     insulin lispro 100 UNIT/ML injection  Commonly known as:  HUMALOG  Inject 0-22 Units into the skin 3 (three) times daily with meals. CBG 0-90 0 units, 91-150 6 units, 151-200 10 units, 201-250 12 units, 251-300 14 units, 301-350 16 units, 351-400 18 units, 401-450 22 units, 451 or more 22 units and call md     levothyroxine 50 MCG tablet  Commonly known as:  SYNTHROID, LEVOTHROID  Take 50 mcg by mouth daily before breakfast.     linezolid 600 MG tablet  Commonly known as:  ZYVOX  Take 1 tablet (600 mg total) by mouth every 12 (twelve) hours.     loperamide 2 MG capsule  Commonly known as:  IMODIUM  Take 2 mg by mouth as needed for diarrhea or loose stools. Do not exceed 8 doses in 24 hours     MINERIN Crea  Apply 1 application topically 2 (two) times daily. Apply to both lower extremities and feet twice daily     ondansetron 4 MG disintegrating tablet  Commonly known as:  ZOFRAN ODT  Take 1 tablet (4 mg total) by mouth every 8 (eight) hours as needed for nausea or vomiting.     pantoprazole 40 MG tablet  Commonly known as:  PROTONIX  Take 1 tablet (40 mg total) by mouth daily.     polyethylene glycol packet  Commonly known as:  MIRALAX / GLYCOLAX  Take 17 g by mouth daily. Mix in 8 oz liquid and drink     rosuvastatin 20 MG tablet  Commonly known as:  CRESTOR  Take 20 mg by mouth every evening. 4pm     Vitamin D (Ergocalciferol) 50000 UNITS Caps capsule  Commonly known as:  DRISDOL  Take  50,000 Units by mouth every 7 (seven) days. On Saturdays       Allergies  Allergen Reactions  . Ace Inhibitors Other (See Comments)    Listed on MAR  . Codeine Other (See Comments)    Listed on MAR  . Lisinopril Other (See Comments)    unknown  . Penicillins Hives  . Trimethoprim   . Septra [Sulfamethoxazole-Trimethoprim] Hives and Rash  . Sulfur Hives and Rash      The results of significant diagnostics from this hospitalization (including imaging, microbiology, ancillary and laboratory) are listed below for reference.    Significant Diagnostic Studies: Ct Abdomen Pelvis Wo Contrast  10/03/2014   CLINICAL DATA:  Patient fell wall trying to get into bend today. No complaints after falling. Complete is generalized weakness. History of hypertension, diabetes, TIA, CAD, hysterectomy, chronic kidney disease, cardiac catheterization.  EXAM: CT ABDOMEN AND PELVIS WITHOUT CONTRAST  TECHNIQUE: Multidetector CT imaging of the abdomen and pelvis was performed following the standard protocol without IV contrast.  COMPARISON:  08/10/2014 ultrasound and 10/12/2013 CT  FINDINGS: Lower chest: There is a calcified granuloma at the left lung base. Coronary artery calcifications are present. No pericardial effusion. Heart size is normal.  Upper abdomen: No focal abnormality within the liver, spleen, pancreas, or adrenal glands. There is dense calcification of the renal arteries bilaterally. No definite renal masses. No hydronephrosis. No ureteral stones. The gallbladder is present. Possible layering small stones or dense sludge.  Gastrointestinal tract: The stomach and small bowel loops are normal in appearance. The appendix is well seen and has a normal appearance. Colonic loops are normal in wall thickness and caliber. There is moderate stool burden especially involving the right colon.  Pelvis: The uterus is absent. There is no free pelvic fluid. No pelvic adenopathy. No adnexal mass.  Retroperitoneum:  There is dense calcification of the aorta. Right iliac artery stent. No retroperitoneal or mesenteric adenopathy.  Abdominal wall: Unremarkable.  Osseous structures: There is significant spondylosis of the lower thoracic and lumbar spine. No suspicious lytic or blastic lesions are identified. Chronic deformity of the right ischial tuberosity. No acute fractures identified.  IMPRESSION: 1. No evidence for acute injury of the abdomen or pelvis. 2. Coronary artery disease. 3. Suspect small layering stones or dense sludge within the gallbladder. 4. Moderate stool burden. 5. Status post hysterectomy. 6. Old deformity of the right ischial tuberosity.   Electronically Signed   By: Nolon Nations M.D.   On: 10/03/2014 15:02   Dg Chest 2 View  10/03/2014   CLINICAL DATA:  Shortness of breath.  The patient fell today.  COPD.  EXAM: CHEST  2 VIEW  COMPARISON:  08/09/2014 and 11/22/2013  FINDINGS: The heart size and pulmonary vascularity are normal and the lungs are clear. There is calcification in the thoracic aorta. No acute osseous abnormality. Lower cervical fusion. Previous right shoulder surgery.  IMPRESSION: No acute abnormalities.   Electronically Signed   By: Lorriane Shire M.D.   On: 10/03/2014 11:51   Ct Head Wo Contrast  10/03/2014   CLINICAL DATA:  The patient fell trying to get into bed today. Fatigue. Right leg weakness.  EXAM: CT HEAD WITHOUT CONTRAST  TECHNIQUE: Contiguous axial images were obtained from the base of the skull through the vertex without intravenous contrast.  COMPARISON:  03/17/2014  FINDINGS: There is diffuse cerebral cortical atrophy with secondary ventricular dilatation. The atrophy is most severe in the temporal lobes. Extensive chronic periventricular white matter lucency consistent with chronic small vessel ischemic disease, stable. There is no acute intracranial hemorrhage, acute infarction, or intracranial mass lesion. Osseous structures are normal.  IMPRESSION: No acute  abnormalities. Atrophy and chronic small vessel ischemic disease.   Electronically Signed   By: Lorriane Shire M.D.   On: 10/03/2014 14:48   Mr Brain Wo Contrast  10/05/2014   CLINICAL DATA:  Initial evaluation for acute dizziness. History of COPD, TIA, diabetes.  EXAM: MRI HEAD WITHOUT CONTRAST  TECHNIQUE: Multiplanar, multiecho pulse sequences of the brain and surrounding structures were obtained without intravenous contrast.  COMPARISON:  Prior CT from 10/03/2014 as well as MRI from 10/07/2013.  FINDINGS: Study is moderately degraded by motion artifact.  Diffuse prominence of the CSF containing spaces is compatible with generalized age-related cerebral atrophy. Moderate chronic small vessel ischemic changes present within the periventricular and deep white matter both cerebral hemispheres. Small vessel disease involves the pons as well.  No abnormal foci of restricted diffusion to suggest acute intracranial infarct identified. Gray-white matter differentiation maintained. Normal intravascular flow voids intact. No acute or chronic intracranial hemorrhage.  No mass lesion or midline shift. No mass effect. Ventricular prominence related to global parenchymal volume loss present without hydrocephalus. No extra-axial fluid collection.  Craniocervical junction within normal limits. Pituitary gland unremarkable.  No acute abnormality about the orbits.  Mild mucosal thickening present within the maxillary sinuses and ethmoidal air cells. No air-fluid levels to suggest active sinus infection. No mastoid effusion. Inner ear structures grossly normal.  Bone marrow signal intensity within normal limits. Scalp soft tissues unremarkable.  IMPRESSION: 1. No acute intracranial infarct or other process identified. 2. Generalized cerebral atrophy with moderate to advanced chronic microvascular ischemic disease.   Electronically Signed   By: Jeannine Boga M.D.   On: 10/05/2014 22:24   Dg Abd Portable 1v  10/07/2014    CLINICAL DATA:  Right lower abdominal pain for 2 days  EXAM: PORTABLE ABDOMEN - 1 VIEW  COMPARISON:  None.  FINDINGS: Scattered large and small bowel gas is noted. No abnormal mass or abnormal calcifications are seen. Calcified injection granulomas are noted within the right buttocks. Postsurgical changes are seen. No acute bony abnormality is noted.  IMPRESSION: No acute abnormality seen.   Electronically Signed   By: Inez Catalina M.D.   On: 10/07/2014 14:45    Microbiology: Recent Results (from the past 240 hour(s))  Urine culture     Status: None   Collection Time: 10/03/14  1:58 PM  Result Value Ref Range Status   Specimen Description URINE, CLEAN CATCH  Final   Special Requests NONE  Final   Colony Count   Final    >=100,000 COLONIES/ML Performed at [facility]    Culture   Final    Multiple bacterial morphotypes present, none predominant. Suggest appropriate recollection if clinically indicated. Performed at [facility]    Report Status 10/04/2014 FINAL  Final  Urine culture     Status: None   Collection Time: 10/05/14 11:52 AM  Result Value Ref Range Status   Specimen Description URINE, CATHETERIZED  Final   Special Requests Normal  Final   Colony Count   Final    80,000 COLONIES/ML Performed at [facility]    Culture   Final    ENTEROCOCCUS SPECIES Performed at  [facility]    Report Status 10/07/2014 FINAL  Final   Organism ID, Bacteria ENTEROCOCCUS SPECIES  Final      Susceptibility   Enterococcus species - MIC*    AMPICILLIN <=2 SENSITIVE Sensitive     LEVOFLOXACIN >=8 RESISTANT Resistant     NITROFURANTOIN <=16 SENSITIVE Sensitive     VANCOMYCIN 1 SENSITIVE Sensitive     TETRACYCLINE >=16 RESISTANT Resistant     * ENTEROCOCCUS SPECIES  MRSA PCR Screening     Status: None   Collection Time: 10/05/14  7:30 PM  Result Value Ref Range Status   MRSA by PCR NEGATIVE NEGATIVE Final    Comment:        The GeneXpert MRSA Assay  (FDA approved for NASAL specimens only), is one component of a comprehensive MRSA colonization surveillance program. It is not intended to diagnose MRSA infection nor to guide or monitor treatment for MRSA infections.      Labs: Basic Metabolic Panel:  Recent Labs Lab 10/03/14 1218 10/05/14 1123 10/06/14 0330 10/07/14 0346  NA 140 139 140 143  K 4.8 4.8 4.5 4.9  CL 105 103 106 109  CO2 27 25 27 24   GLUCOSE 105* 116* 146* 190*  BUN 35* 45* 44* 35*  CREATININE 1.81* 2.25* 2.01* 1.79*  CALCIUM 9.5 9.4 8.8* 9.5   Liver Function Tests:  Recent Labs Lab 10/03/14 1218 10/06/14 0330 10/07/14 1645  AST 20 13* 17  ALT 14 12* 12*  ALKPHOS 62 56 61  BILITOT 0.3 0.3 0.6  PROT 7.3 6.3* 6.1*  ALBUMIN 3.5 2.9* 3.0*    Recent Labs Lab 10/07/14 1645  LIPASE <10*   No results for input(s): AMMONIA in the last 168 hours. CBC:  Recent Labs Lab 10/03/14 1218 10/05/14 1123 10/06/14 0330  WBC 7.9 8.2 7.7  NEUTROABS 4.2 4.1  --   HGB 11.7* 10.5* 9.1*  HCT 38.0 33.9* 30.0*  MCV 92.2 92.9 92.6  PLT 194 215 211   Cardiac Enzymes:  Recent Labs Lab 10/03/14 1218  TROPONINI <0.03   BNP: BNP (last 3 results)  Recent Labs  08/10/14 0608 10/03/14 1217  BNP 170.7* 323.1*    ProBNP (last 3 results) No results for input(s): PROBNP in the last 8760 hours.  CBG:  Recent Labs Lab 10/07/14 1106 10/07/14 1702 10/07/14 2106 10/08/14 0726 10/08/14 1111  GLUCAP 197* 110* 148* 92 133*       SignedDebbe Odea, MD [facility] 10/08/2014, 2:11 PM

## 2014-10-08 NOTE — Clinical Social Work Note (Signed)
Patient can return to [facility] once medically stable.   Glendon Axe, MSW, LCSWA [phone] 10/08/2014 1:53 PM

## 2014-10-09 ENCOUNTER — Other Ambulatory Visit (HOSPITAL_COMMUNITY): Payer: Self-pay

## 2014-10-09 ENCOUNTER — Emergency Department (HOSPITAL_COMMUNITY): Payer: Medicare Other

## 2014-10-09 ENCOUNTER — Encounter (HOSPITAL_COMMUNITY): Payer: Self-pay

## 2014-10-09 ENCOUNTER — Inpatient Hospital Stay (HOSPITAL_COMMUNITY)
Admission: EM | Admit: 2014-10-09 | Discharge: 2014-10-13 | DRG: 637 | Disposition: A | Discharge status: Skilled Nursing Facility | Payer: Medicare Other | Attending: Internal Medicine | Admitting: Internal Medicine

## 2014-10-09 DIAGNOSIS — Z8673 Personal history of transient ischemic attack (TIA), and cerebral infarction without residual deficits: Secondary | ICD-10-CM

## 2014-10-09 DIAGNOSIS — I129 Hypertensive chronic kidney disease with stage 1 through stage 4 chronic kidney disease, or unspecified chronic kidney disease: Secondary | ICD-10-CM | POA: Diagnosis present

## 2014-10-09 DIAGNOSIS — Z66 Do not resuscitate: Secondary | ICD-10-CM | POA: Diagnosis present

## 2014-10-09 DIAGNOSIS — I739 Peripheral vascular disease, unspecified: Secondary | ICD-10-CM | POA: Diagnosis present

## 2014-10-09 DIAGNOSIS — Z885 Allergy status to narcotic agent status: Secondary | ICD-10-CM | POA: Diagnosis not present

## 2014-10-09 DIAGNOSIS — E039 Hypothyroidism, unspecified: Secondary | ICD-10-CM | POA: Diagnosis present

## 2014-10-09 DIAGNOSIS — E785 Hyperlipidemia, unspecified: Secondary | ICD-10-CM | POA: Diagnosis present

## 2014-10-09 DIAGNOSIS — R251 Tremor, unspecified: Secondary | ICD-10-CM

## 2014-10-09 DIAGNOSIS — Z794 Long term (current) use of insulin: Secondary | ICD-10-CM

## 2014-10-09 DIAGNOSIS — R0602 Shortness of breath: Secondary | ICD-10-CM | POA: Diagnosis not present

## 2014-10-09 DIAGNOSIS — N183 Chronic kidney disease, stage 3 unspecified: Secondary | ICD-10-CM | POA: Diagnosis present

## 2014-10-09 DIAGNOSIS — G9340 Encephalopathy, unspecified: Secondary | ICD-10-CM | POA: Diagnosis not present

## 2014-10-09 DIAGNOSIS — I10 Essential (primary) hypertension: Secondary | ICD-10-CM

## 2014-10-09 DIAGNOSIS — E875 Hyperkalemia: Secondary | ICD-10-CM | POA: Diagnosis present

## 2014-10-09 DIAGNOSIS — B952 Enterococcus as the cause of diseases classified elsewhere: Secondary | ICD-10-CM | POA: Diagnosis present

## 2014-10-09 DIAGNOSIS — E7800 Pure hypercholesterolemia, unspecified: Secondary | ICD-10-CM

## 2014-10-09 DIAGNOSIS — I5033 Acute on chronic diastolic (congestive) heart failure: Secondary | ICD-10-CM | POA: Diagnosis present

## 2014-10-09 DIAGNOSIS — H3530 Unspecified macular degeneration: Secondary | ICD-10-CM | POA: Diagnosis present

## 2014-10-09 DIAGNOSIS — E162 Hypoglycemia, unspecified: Secondary | ICD-10-CM

## 2014-10-09 DIAGNOSIS — N189 Chronic kidney disease, unspecified: Secondary | ICD-10-CM

## 2014-10-09 DIAGNOSIS — D649 Anemia, unspecified: Secondary | ICD-10-CM | POA: Diagnosis present

## 2014-10-09 DIAGNOSIS — I5189 Other ill-defined heart diseases: Secondary | ICD-10-CM

## 2014-10-09 DIAGNOSIS — I2510 Atherosclerotic heart disease of native coronary artery without angina pectoris: Secondary | ICD-10-CM | POA: Diagnosis present

## 2014-10-09 DIAGNOSIS — Z880 Allergy status to penicillin: Secondary | ICD-10-CM | POA: Diagnosis not present

## 2014-10-09 DIAGNOSIS — Z7982 Long term (current) use of aspirin: Secondary | ICD-10-CM

## 2014-10-09 DIAGNOSIS — Z79891 Long term (current) use of opiate analgesic: Secondary | ICD-10-CM

## 2014-10-09 DIAGNOSIS — Z888 Allergy status to other drugs, medicaments and biological substances status: Secondary | ICD-10-CM

## 2014-10-09 DIAGNOSIS — R0902 Hypoxemia: Secondary | ICD-10-CM

## 2014-10-09 DIAGNOSIS — N179 Acute kidney failure, unspecified: Secondary | ICD-10-CM | POA: Diagnosis present

## 2014-10-09 DIAGNOSIS — E11649 Type 2 diabetes mellitus with hypoglycemia without coma: Principal | ICD-10-CM | POA: Diagnosis present

## 2014-10-09 DIAGNOSIS — Z791 Long term (current) use of non-steroidal anti-inflammatories (NSAID): Secondary | ICD-10-CM | POA: Diagnosis not present

## 2014-10-09 DIAGNOSIS — J441 Chronic obstructive pulmonary disease with (acute) exacerbation: Secondary | ICD-10-CM

## 2014-10-09 DIAGNOSIS — E119 Type 2 diabetes mellitus without complications: Secondary | ICD-10-CM | POA: Diagnosis not present

## 2014-10-09 DIAGNOSIS — I509 Heart failure, unspecified: Secondary | ICD-10-CM

## 2014-10-09 DIAGNOSIS — Z79899 Other long term (current) drug therapy: Secondary | ICD-10-CM | POA: Diagnosis not present

## 2014-10-09 DIAGNOSIS — J449 Chronic obstructive pulmonary disease, unspecified: Secondary | ICD-10-CM | POA: Diagnosis present

## 2014-10-09 DIAGNOSIS — E1165 Type 2 diabetes mellitus with hyperglycemia: Secondary | ICD-10-CM | POA: Diagnosis not present

## 2014-10-09 DIAGNOSIS — H6120 Impacted cerumen, unspecified ear: Secondary | ICD-10-CM

## 2014-10-09 DIAGNOSIS — R609 Edema, unspecified: Secondary | ICD-10-CM

## 2014-10-09 DIAGNOSIS — N390 Urinary tract infection, site not specified: Secondary | ICD-10-CM

## 2014-10-09 DIAGNOSIS — G459 Transient cerebral ischemic attack, unspecified: Secondary | ICD-10-CM | POA: Diagnosis present

## 2014-10-09 DIAGNOSIS — N12 Tubulo-interstitial nephritis, not specified as acute or chronic: Secondary | ICD-10-CM

## 2014-10-09 DIAGNOSIS — F17210 Nicotine dependence, cigarettes, uncomplicated: Secondary | ICD-10-CM | POA: Diagnosis present

## 2014-10-09 DIAGNOSIS — E1142 Type 2 diabetes mellitus with diabetic polyneuropathy: Secondary | ICD-10-CM | POA: Diagnosis present

## 2014-10-09 DIAGNOSIS — K219 Gastro-esophageal reflux disease without esophagitis: Secondary | ICD-10-CM | POA: Diagnosis present

## 2014-10-09 DIAGNOSIS — R531 Weakness: Secondary | ICD-10-CM

## 2014-10-09 DIAGNOSIS — J9602 Acute respiratory failure with hypercapnia: Secondary | ICD-10-CM | POA: Diagnosis present

## 2014-10-09 DIAGNOSIS — R404 Transient alteration of awareness: Secondary | ICD-10-CM

## 2014-10-09 DIAGNOSIS — R7989 Other specified abnormal findings of blood chemistry: Secondary | ICD-10-CM

## 2014-10-09 DIAGNOSIS — R001 Bradycardia, unspecified: Secondary | ICD-10-CM | POA: Diagnosis present

## 2014-10-09 DIAGNOSIS — E43 Unspecified severe protein-calorie malnutrition: Secondary | ICD-10-CM

## 2014-10-09 DIAGNOSIS — R55 Syncope and collapse: Secondary | ICD-10-CM

## 2014-10-09 LAB — URINALYSIS, ROUTINE W REFLEX MICROSCOPIC
Bilirubin Urine: NEGATIVE
GLUCOSE, UA: NEGATIVE mg/dL
Hgb urine dipstick: NEGATIVE
Ketones, ur: NEGATIVE mg/dL
Nitrite: NEGATIVE
PH: 5 (ref 5.0–8.0)
Protein, ur: >300 mg/dL — AB
Specific Gravity, Urine: 1.018 (ref 1.005–1.030)
Urobilinogen, UA: 0.2 mg/dL (ref 0.0–1.0)

## 2014-10-09 LAB — I-STAT ARTERIAL BLOOD GAS, ED
Acid-base deficit: 1 mmol/L (ref 0.0–2.0)
Bicarbonate: 26.9 mEq/L — ABNORMAL HIGH (ref 20.0–24.0)
O2 Saturation: 94 %
Patient temperature: 98.6
TCO2: 29 mmol/L (ref 0–100)
pCO2 arterial: 58.6 mmHg (ref 35.0–45.0)
pH, Arterial: 7.269 — ABNORMAL LOW (ref 7.350–7.450)
pO2, Arterial: 85 mmHg (ref 80.0–100.0)

## 2014-10-09 LAB — CBC WITH DIFFERENTIAL/PLATELET
Basophils Absolute: 0 10*3/uL (ref 0.0–0.1)
Basophils Relative: 0 % (ref 0–1)
Eosinophils Absolute: 0 10*3/uL (ref 0.0–0.7)
Eosinophils Relative: 0 % (ref 0–5)
HCT: 31.5 % — ABNORMAL LOW (ref 36.0–46.0)
Hemoglobin: 9.4 g/dL — ABNORMAL LOW (ref 12.0–15.0)
Lymphocytes Relative: 15 % (ref 12–46)
Lymphs Abs: 1.1 10*3/uL (ref 0.7–4.0)
MCH: 28 pg (ref 26.0–34.0)
MCHC: 29.8 g/dL — ABNORMAL LOW (ref 30.0–36.0)
MCV: 93.8 fL (ref 78.0–100.0)
Monocytes Absolute: 0.4 10*3/uL (ref 0.1–1.0)
Monocytes Relative: 5 % (ref 3–12)
Neutro Abs: 5.9 10*3/uL (ref 1.7–7.7)
Neutrophils Relative %: 80 % — ABNORMAL HIGH (ref 43–77)
Platelets: 234 10*3/uL (ref 150–400)
RBC: 3.36 MIL/uL — ABNORMAL LOW (ref 3.87–5.11)
RDW: 14.2 % (ref 11.5–15.5)
WBC: 7.4 10*3/uL (ref 4.0–10.5)

## 2014-10-09 LAB — TROPONIN I: Troponin I: <0.03 ng/mL (ref <0.031)

## 2014-10-09 LAB — BASIC METABOLIC PANEL
Anion gap: 11 (ref 5–15)
BUN: 31 mg/dL — AB (ref 6–20)
CALCIUM: 9.2 mg/dL (ref 8.9–10.3)
CHLORIDE: 107 mmol/L (ref 101–111)
CO2: 24 mmol/L (ref 22–32)
CREATININE: 1.91 mg/dL — AB (ref 0.44–1.00)
GFR calc Af Amer: 28 mL/min — ABNORMAL LOW (ref >60)
GFR, EST NON AFRICAN AMERICAN: 24 mL/min — AB (ref >60)
GLUCOSE: 135 mg/dL — AB (ref 65–99)
POTASSIUM: 5.5 mmol/L — AB (ref 3.5–5.1)
Sodium: 142 mmol/L (ref 135–145)

## 2014-10-09 LAB — ACETAMINOPHEN LEVEL

## 2014-10-09 LAB — URINE MICROSCOPIC-ADD ON

## 2014-10-09 LAB — CBG MONITORING, ED
GLUCOSE-CAPILLARY: 122 mg/dL — AB (ref 65–99)
Glucose-Capillary: 123 mg/dL — ABNORMAL HIGH (ref 65–99)

## 2014-10-09 LAB — I-STAT CG4 LACTIC ACID, ED: Lactic Acid, Venous: 0.34 mmol/L — ABNORMAL LOW (ref 0.5–2.0)

## 2014-10-09 MED ORDER — DEXTROSE-NACL 5-0.45 % IV SOLN
INTRAVENOUS | Status: DC
  Administered 2014-10-09: 21:00:00 via INTRAVENOUS

## 2014-10-09 MED ORDER — NALOXONE HCL 0.4 MG/ML IJ SOLN: 0.4000 mg | Freq: Once | INTRAMUSCULAR | Status: DC

## 2014-10-09 MED ORDER — FUROSEMIDE 10 MG/ML IJ SOLN
80.0000 mg | Freq: Once | INTRAMUSCULAR | Status: AC
Start: 2014-10-09 — End: 2014-10-09
  Administered 2014-10-09: 80 mg via INTRAVENOUS
  Filled 2014-10-09: qty 8

## 2014-10-09 MED ORDER — NALOXONE HCL 1 MG/ML IJ SOLN
INTRAMUSCULAR | Status: AC
  Administered 2014-10-09: 1 mg
  Filled 2014-10-09: qty 2

## 2014-10-09 MED ORDER — ALBUTEROL SULFATE (2.5 MG/3ML) 0.083% IN NEBU
2.5000 mg | INHALATION_SOLUTION | RESPIRATORY_TRACT | Status: DC | PRN
  Administered 2014-10-09: 2.5 mg via RESPIRATORY_TRACT
  Filled 2014-10-09: qty 3

## 2014-10-09 NOTE — H&P (Addendum)
[facility] History and Physical  [name]:[id number] DOB: [birth year] DOA: 10/09/2014  Referring physician: ED physician PCP: No primary care provider on file.  Specialists:   Chief Complaint: altered mental status and hypoglycemia.  HPI: [name] is a 80 y.o. female with PMH of with past medical history of hypertension, hyperlipidemia, GERD, hypothyroidism, diabetes mellitus, COPD, peripheral vascular disease, chronic kidney disease-stage III, history of TIA, carotid artery stenosis, history of tobacco abuse, diastolic congestive heart failure, who presents with altered mental status and hypoglycemia.  Patient has AMS and hx was obtained from ED and EMS reports. Patient was recently hospitalized from 5/22 to 5/25 because of near syncope, acute on chronic renal injury, bradycardia and UTI (positive for enterococcus, sensitive to ampicillin). She was discharged at stable condition with improved renal function. She continued to Zyvox for UTI (until 5/30 per d/c summry). She had negative MRI of brain in previous admission. It seems that patient was found to be unresponsive by staff in SNF today. She was found to have cbg was 32 which improved to 222 after given d50. Per EDP, dr. Jeneen Rinks, her mental status improved after given 2 doses of Narcan in the emergency room. When I saw patient in ED, she is somnolent. Says 'no" when asked whether he is hurting anywhere.   In ED, patient was found to have WBC 7.4, temperature 98.9, bradycardia, urinalysis showed small amount of leukocyte, negative troponin, lactate 0.34, potassium 5.5 without T-wave peaking, stable renal function. Chest x-ray showed new pulmonary edema. ABG showed pH 7.269, PCO2 58.6, PaO2 85%. She is admitted to inpatient for further evaluation and treatment.  Where does patient live?   SNF     Can patient participate in ADLs? None  Review of Systems:  Could not obtained due to AMS  Allergy:  Allergies  Allergen  Reactions  . Ace Inhibitors Other (See Comments)    Listed on MAR  . Codeine Other (See Comments)    Listed on MAR  . Lisinopril Other (See Comments)    unknown  . Penicillins Hives  . Trimethoprim   . Septra [Sulfamethoxazole-Trimethoprim] Hives and Rash  . Sulfur Hives and Rash    Past Medical History  Diagnosis Date  . Hypertension   . Macular degeneration, bilateral   . Type II diabetes mellitus   . COPD (chronic obstructive pulmonary disease)   . Diabetic peripheral neuropathy   . Hypercholesteremia   . PAD (peripheral artery disease)     a. 1997 s/p R fem-pop bypass;  b. 03/1999 R CIA stenting.  . CKD (chronic kidney disease), stage III   . Headache(784.0)   . Hypothyroid   . Non-obstructive CAD     a. 11/2005 Cath: LM nl, LAD 30 diffuse, LCX small, 20p, 67m/d, 75d into OM, RCA dominant, 40p, 30-40 diffuse, EF 55%.  Marland Kitchen TIA (transient ischemic attack)     a. 09/2013.  . H/O echocardiogram     a. 09/2013 Echo: nl LV fxn, no rwma, mildly to mod dil LA, mildly dil RA.  . Carotid arterial disease     a. 09/2013 Carotid U/S: 40-59% bilat ICA stenosis.  . Enterococcus UTI     a. 09/2013.  . Tobacco abuse   . Cholelithiasis   . Pyelonephritis     Past Surgical History  Procedure Laterality Date  . Tonsillectomy  1940's  . Abdominal hysterectomy  1970's  . Dilation and curettage of uterus  1970's    "probably 2" (11/01/2012)  .  Cataract extraction w/ intraocular lens  implant, bilateral Bilateral ~ 2010  . Bypass graft Right ~ 1997    RLE by Dr. Gwenlyn Perking 11/29/2005 (11/01/2012)  . Cystostomy w/ bladder biopsy  2005    Ar[facility] Endo 09/19/2003 (11/01/2012)  . Anterior cervical decomp/discectomy fusion  01/02/2006    [facility] Endo 01/02/2006 (11/01/2012)  . Shoulder open rotator cuff repair Bilateral 1990's-2000's    "3X on the left; twice on the right" (11/01/2012)  . Cardiac catheterization  11/29/2005    [facility] Endo 11/29/2005 (11/01/2012)  . Esophagogastroduodenoscopy N/A 10/14/2013     Procedure: ESOPHAGOGASTRODUODENOSCOPY (EGD);  Surgeon: Missy Sabins, MD;  Location: [facility];  Service: Endoscopy;  Laterality: N/A;    Social History:  reports that she has been smoking Cigarettes.  She has a 7.2 pack-year smoking history. She has never used smokeless tobacco. She reports that she does not drink alcohol or use illicit drugs.  Family History:  Family History  Problem Relation Age of Onset  . Diabetes Mother      Prior to Admission medications   Medication Sig Start Date End Date Taking? Authorizing Provider  acetaminophen (TYLENOL) 500 MG tablet Take 500 mg by mouth 3 (three) times daily. 8am 12pm, 4pm   Yes Historical Provider, MD  albuterol (PROVENTIL) (2.5 MG/3ML) 0.083% nebulizer solution Take 2.5 mg by nebulization every 6 (six) hours as needed for wheezing.   Yes Historical Provider, MD  ALPRAZolam (XANAX) 0.25 MG tablet Take 1 tablet (0.25 mg total) by mouth 2 (two) times daily as needed for anxiety. 10/08/14  Yes Debbe Odea, MD  amitriptyline (ELAVIL) 50 MG tablet Take 50 mg by mouth at bedtime.   Yes Historical Provider, MD  amLODipine (NORVASC) 10 MG tablet Take 10 mg by mouth at bedtime.    Yes Historical Provider, MD  aspirin 81 MG chewable tablet Chew 81 mg by mouth daily.   Yes Historical Provider, MD  B Complex-C (B-COMPLEX WITH VITAMIN C) tablet Take 1 tablet by mouth daily.   Yes Historical Provider, MD  bisacodyl (DULCOLAX) 10 MG suppository Place 10 mg rectally as needed for moderate constipation.   Yes Historical Provider, MD  carvedilol (COREG) 25 MG tablet Take 25 mg by mouth 2 (two) times daily with a meal. 0800 and 1700   Yes Historical Provider, MD  cloNIDine (CATAPRES) 0.2 MG tablet Take 0.2 mg by mouth 2 (two) times daily. Hold if blood pressure less than 100/60   Yes Historical Provider, MD  Cranberry 500 MG CAPS Take 1,000 mg by mouth 2 (two) times daily.   Yes Historical Provider, MD  docusate sodium (COLACE) 100 MG capsule Take 1 capsule  (100 mg total) by mouth every 12 (twelve) hours. 08/07/14  Yes Kristen N Ward, DO  escitalopram (LEXAPRO) 20 MG tablet Take 20 mg by mouth daily.   Yes Historical Provider, MD  famotidine (PEPCID) 20 MG tablet Take 1 tablet (20 mg total) by mouth 2 (two) times daily as needed for heartburn or indigestion. 10/08/14  Yes Debbe Odea, MD  furosemide (LASIX) 40 MG tablet Take 40 mg by mouth daily.   Yes Historical Provider, MD  gabapentin (NEURONTIN) 300 MG capsule Take 1 capsule (300 mg total) by mouth 2 (two) times daily. 08/13/14  Yes Reyne Dumas, MD  guaiFENesin (ROBITUSSIN) 100 MG/5ML liquid Take 100 mg by mouth every 6 (six) hours as needed for cough. Not to exceed 4 doses in 24 hours   Yes Historical Provider, MD  HYDROcodone-acetaminophen (NORCO/VICODIN) 5-325 MG per tablet  1 by mouth every 6 hours as needed for moderate pain, 2 by mouth every 6 hours as needed for sever pain DO NOT EXCEED 4 GM OF APAP IN 24 HOURS 10/08/14  Yes Debbe Odea, MD  insulin glargine (LANTUS) 100 UNIT/ML injection Inject 0.4 mLs (40 Units total) into the skin daily. 08/13/14  Yes Reyne Dumas, MD  insulin lispro (HUMALOG) 100 UNIT/ML injection Inject 0-22 Units into the skin 3 (three) times daily with meals. CBG 0-90 0 units, 91-150 6 units, 151-200 10 units, 201-250 12 units, 251-300 14 units, 301-350 16 units, 351-400 18 units, 401-450 22 units, 451 or more 22 units and call md   Yes Historical Provider, MD  levothyroxine (SYNTHROID, LEVOTHROID) 50 MCG tablet Take 50 mcg by mouth daily before breakfast.    Yes Historical Provider, MD  linezolid (ZYVOX) 600 MG tablet Take 1 tablet (600 mg total) by mouth every 12 (twelve) hours. 10/08/14  Yes Debbe Odea, MD  loperamide (IMODIUM) 2 MG capsule Take 2 mg by mouth as needed for diarrhea or loose stools. Do not exceed 8 doses in 24 hours   Yes Historical Provider, MD  ondansetron (ZOFRAN ODT) 4 MG disintegrating tablet Take 1 tablet (4 mg total) by mouth every 8 (eight) hours  as needed for nausea or vomiting. 08/07/14  Yes Kristen N Ward, DO  pantoprazole (PROTONIX) 40 MG tablet Take 1 tablet (40 mg total) by mouth daily. 08/13/14  Yes Reyne Dumas, MD  polyethylene glycol (MIRALAX / GLYCOLAX) packet Take 17 g by mouth daily. Mix in 8 oz liquid and drink   Yes Historical Provider, MD  rosuvastatin (CRESTOR) 20 MG tablet Take 20 mg by mouth every evening. 4pm   Yes Historical Provider, MD  Skin Protectants, Misc. (MINERIN) CREA Apply 1 application topically 2 (two) times daily. Apply to both lower extremities and feet twice daily   Yes Historical Provider, MD  Vitamin D, Ergocalciferol, (DRISDOL) 50000 UNITS CAPS capsule Take 50,000 Units by mouth every 7 (seven) days. On Saturdays   Yes Historical Provider, MD  Alum & Mag Hydroxide-Simeth (GI COCKTAIL) SUSP suspension Take 30 mLs by mouth 3 (three) times daily as needed for indigestion. Shake well. Patient not taking: Reported on 10/09/2014 10/08/14   Debbe Odea, MD  alum & mag hydroxide-simeth (MAALOX/MYLANTA) 200-200-20 MG/5ML suspension Take 30 mLs by mouth every 6 (six) hours as needed for indigestion or heartburn. Do not exceed 4 doses in 24 hours    Historical Provider, MD    Physical Exam: Filed Vitals:   10/09/14 2315 10/10/14 0010 10/10/14 0100 10/10/14 0200  BP: 154/56 153/74 154/42 161/48  Pulse: 53 63 55 56  Temp:  97.6 F (36.4 C)    TempSrc:  Rectal    Resp: 13 16 14 15   Height:  5\' 3"  (1.6 m)    Weight:  88 kg (194 lb 0.1 oz)    SpO2: 95% 97% 96% 96%   General: Not in acute distress HEENT:       Eyes: PERRL, EOMI, no scleral icterus.       ENT: No discharge from the ears and nose, no pharynx injection, no tonsillar enlargement.        Neck: No JVD, no bruit, no mass felt. Heme: No neck lymph node enlargement. Cardiac: S1/S2, RRR, bradycardia, No murmurs, No gallops or rubs. Pulm: has crackles bilaterally, No wheezing, rhonchi or rubs. Abd: Soft, nondistended, nontender, no rebound pain, no  organomegaly, BS present. Ext: 1+ pitting leg edema  bilaterally. 2+DP/PT pulse bilaterally. Musculoskeletal: No joint deformities, No joint redness or warmth. Skin: No rashes.  Neuro: somnolent, not oriented X3, cranial nerves II-XII grossly intact, moves both arms, moves legs very little bit.  Psych: Patient is not psychotic, no suicidal or hemocidal ideation.  Labs on Admission:  Basic Metabolic Panel:  Recent Labs Lab 10/03/14 1218 10/05/14 1123 10/06/14 0330 10/07/14 0346 10/09/14 2015  NA 140 139 140 143 142  K 4.8 4.8 4.5 4.9 5.5*  CL 105 103 106 109 107  CO2 27 25 27 24 24   GLUCOSE 105* 116* 146* 190* 135*  BUN 35* 45* 44* 35* 31*  CREATININE 1.81* 2.25* 2.01* 1.79* 1.91*  CALCIUM 9.5 9.4 8.8* 9.5 9.2   Liver Function Tests:  Recent Labs Lab 10/03/14 1218 10/06/14 0330 10/07/14 1645  AST 20 13* 17  ALT 14 12* 12*  ALKPHOS 62 56 61  BILITOT 0.3 0.3 0.6  PROT 7.3 6.3* 6.1*  ALBUMIN 3.5 2.9* 3.0*    Recent Labs Lab 10/07/14 1645  LIPASE <10*   No results for input(s): AMMONIA in the last 168 hours. CBC:  Recent Labs Lab 10/03/14 1218 10/05/14 1123 10/06/14 0330 10/09/14 2015  WBC 7.9 8.2 7.7 7.4  NEUTROABS 4.2 4.1  --  5.9  HGB 11.7* 10.5* 9.1* 9.4*  HCT 38.0 33.9* 30.0* 31.5*  MCV 92.2 92.9 92.6 93.8  PLT 194 215 211 234   Cardiac Enzymes:  Recent Labs Lab 10/03/14 1218 10/09/14 2015 10/10/14 0141  TROPONINI <0.03 <0.03 <0.03    BNP (last 3 results)  Recent Labs  08/10/14 0608 10/03/14 1217  BNP 170.7* 323.1*    ProBNP (last 3 results) No results for input(s): PROBNP in the last 8760 hours.  CBG:  Recent Labs Lab 10/08/14 1111 10/09/14 2010 10/09/14 2155 10/10/14 0105 10/10/14 0226  GLUCAP 133* 123* 122* 140* 125*    Radiological Exams on Admission: Dg Chest Port 1 View  10/09/2014   CLINICAL DATA:  Shortness of breath  EXAM: PORTABLE CHEST - 1 VIEW  COMPARISON:  10/03/2014  FINDINGS: Diffuse interstitial opacity.  Despite a hazy density at the left base the lungs are fairly symmetric. No definitive pleural effusion. Normal heart size and stable mediastinal contours. Ventral and dorsal cervical spine fixation. No acute osseous findings.  IMPRESSION: Pulmonary edema, new from 10/03/2014.   Electronically Signed   By: Monte Fantasia M.D.   On: 10/09/2014 20:54    EKG: Independently reviewed.  Abnormal findings: First degree AV block, right bundle blockage, low voltage, no T-wave peaking, bradycardia, QTc interval 499.     Assessment/Plan Principal Problem:   Acute encephalopathy Active Problems:   Hypothyroidism   Diabetes mellitus without complication   HYPERCHOLESTEROLEMIA   Essential hypertension   CAD (coronary artery disease)   COPD (chronic obstructive pulmonary disease)   Hyperkalemia   CKD (chronic kidney disease) stage 3, GFR 30-59 ml/min   Hypoglycemia   TIA (transient ischemic attack)   UTI (urinary tract infection)   Bradycardia   CHF exacerbation  Hypoglycemia: Etiology is not complete clear, but It is likely due to decreased oral intake and continuation of Lantus. Patient responded to D50 quickly. Current sugar level>100, will not pursue hypoglycemia -will admit to SDU -hold Lantus and start sliding scale insulin -CBG every hour, and When necessary D50 -hold Percocet  Acute encephalopathy: Likely due to multifactorial etiology, including hypoglycemia, UTI, CHF exacerbation, electrolyte disturbance, narcotic medcation use (per ED, patient's mental status improved after received Narcan).  -  treat UTI as low -Treat hypoglycemia as above -Neuro check every 4 hours  Hyperkalemia: K=5.5 without T wave peaking.  -expect correction with IV lasix -repeat BMP  UTI: Urinalysis is positive for UTI. She is being treated for UTI with Zyvox (until 5/30 per d/c summry). - wil switch to Aztreonam IV since she is unable to take oral meds. -Follow-up blood culture and urine culture  Diabetes  mellitus: A1c 8.1 on 10/05/14, now presents with hypoglycemia -hold Lantus   -sliding scale insulin -Neurontin for neuropathy  Hypertension:  -continue amlodipine, clonidine, Coreg - Also on Lasix for congestive heart failure -Hydralazine when necessary  GERD:  -continue Pepcid IV  Hypothyroidism: TSH was 3.248 on 10/05/14 -Continue Synthroid  Hyperlipidemia: LDL was at 28 on 10/09/14 -Continue Crestor  Acute on chronic diastolic Congestive heart failure: 2-D echo on 10/05/14 showed EF 60-65 % with grade 1 diastolic dysfunction. She has 1+  leg edema. Chest x-ray showed  new palmar edema, indicating CHF exacerbation. -check BNP -Continue aspirin, Coreg -will treat with IV lasix 80 mg qdaily  -ASA  -decreased coreg dose from 25 to 6.25 mg bid due to bradycardia  -will cycle CE X3 -will not get 2-D echo to evaluate EF since patient had one in previous admission -strict In/Out -Daily body weight. -heart diet -No on ACEI because of CKD  Chronic kidney disease-III: Baseline creatinine 1.5-2. Her creatinine is 1.9 on admission, which is at her baseline -follow renal fx by BMP  COPD: No acute exacerbation. -Continue home breathing treatment: Albuterol when necessary   DVT ppx: SQ Heparin    Code Status: DNR Family Communication: None at bed side.     Disposition Plan: Admit to inpatient   Date of Service 10/10/2014    Ivor Costa [facility] Pager [phone]  If 7PM-7AM, please contact night-coverage [email or web address] Password [facility] 10/10/2014, 2:53 AM

## 2014-10-09 NOTE — ED Notes (Signed)
Per EMS, initially called out for unresponsive, cbg was 32, pt given d50 and sbg up to 222. Pt's mental status were not coming around. Lung sound diminished. Crackles ausculated on both sides with ems and diminished in the bases. Pt was discharged yesterday after being admitted for fluid in her lungs. Pt is on NRB upon arrival. EMS VS 144/47, HR 50, RR 18, o2 sat was 90 on 2L(pt is on 2L all the time) 100% on NRB on 10L.

## 2014-10-09 NOTE — ED Provider Notes (Addendum)
CSN: [id number]     Arrival date & time 10/09/14  1958 History   First MD Initiated Contact with Patient 10/09/14 2001     Chief Complaint  Patient presents with  . Altered Mental Status      HPI  Pt presents after being discharged yesterday from a four-day hospitalization. Problem list at the time included:  Hypothyroidism  Diabetes mellitus without complication  Anemia  COPD (chronic obstructive pulmonary disease)  Acute on chronic renal failure  Tremor of both hands  Bradycardia Enterococcus UTI  She resides at [facility] assisted living prior to, and was discharged back to [facility] since her most recent admission.  Paramedics were called to her facility tonight because of decreased level of consciousness. They arrived to find her with a sugar of 32.  Had been giving her evening insulin but did not eat dinner. Negative place and she was given D50. Her blood sugar improved to 200  Level of consciousness did not improved upon transport. She arrives here breathing. Unresponsive. Palpable pulses.  Past Medical History  Diagnosis Date  . Hypertension   . Macular degeneration, bilateral   . Type II diabetes mellitus   . COPD (chronic obstructive pulmonary disease)   . Diabetic peripheral neuropathy   . Hypercholesteremia   . PAD (peripheral artery disease)     a. 1997 s/p R fem-pop bypass;  b. 03/1999 R CIA stenting.  . CKD (chronic kidney disease), stage III   . Headache(784.0)   . Hypothyroid   . Non-obstructive CAD     a. 11/2005 Cath: LM nl, LAD 30 diffuse, LCX small, 20p, 1m/d, 75d into OM, RCA dominant, 40p, 30-40 diffuse, EF 55%.  Marland Kitchen TIA (transient ischemic attack)     a. 09/2013.  . H/O echocardiogram     a. 09/2013 Echo: nl LV fxn, no rwma, mildly to mod dil LA, mildly dil RA.  . Carotid arterial disease     a. 09/2013 Carotid U/S: 40-59% bilat ICA stenosis.  . Enterococcus UTI     a. 09/2013.  . Tobacco abuse   . Cholelithiasis   . Pyelonephritis     Past Surgical History  Procedure Laterality Date  . Tonsillectomy  1940's  . Abdominal hysterectomy  1970's  . Dilation and curettage of uterus  1970's    "probably 2" (11/01/2012)  . Cataract extraction w/ intraocular lens  implant, bilateral Bilateral ~ 2010  . Bypass graft Right ~ 1997    RLE by Dr. Gwenlyn Perking 11/29/2005 (11/01/2012)  . Cystostomy w/ bladder biopsy  2005    Archie Endo 09/19/2003 (11/01/2012)  . Anterior cervical decomp/discectomy fusion  01/02/2006    Archie Endo 01/02/2006 (11/01/2012)  . Shoulder open rotator cuff repair Bilateral 1990's-2000's    "3X on the left; twice on the right" (11/01/2012)  . Cardiac catheterization  11/29/2005    Archie Endo 11/29/2005 (11/01/2012)  . Esophagogastroduodenoscopy N/A 10/14/2013    Procedure: ESOPHAGOGASTRODUODENOSCOPY (EGD);  Surgeon: Missy Sabins, MD;  Location: [facility];  Service: Endoscopy;  Laterality: N/A;   Family History  Problem Relation Age of Onset  . Diabetes Mother    History  Substance Use Topics  . Smoking status: Current Every Day Smoker -- 0.12 packs/day for 60 years    Types: Cigarettes  . Smokeless tobacco: Never Used     Comment: states she smokes 4 cigs per day  . Alcohol Use: No   OB History    No data available     Review of Systems  Unable to perform ROS: Mental status change      Allergies  Ace inhibitors; Codeine; Lisinopril; Penicillins; Trimethoprim; Septra; and Sulfur  Home Medications   Prior to Admission medications   Medication Sig Start Date End Date Taking? Authorizing Provider  acetaminophen (TYLENOL) 500 MG tablet Take 500 mg by mouth 3 (three) times daily. 8am 12pm, 4pm   Yes Historical Provider, MD  albuterol (PROVENTIL) (2.5 MG/3ML) 0.083% nebulizer solution Take 2.5 mg by nebulization every 6 (six) hours as needed for wheezing.   Yes Historical Provider, MD  ALPRAZolam (XANAX) 0.25 MG tablet Take 1 tablet (0.25 mg total) by mouth 2 (two) times daily as needed for anxiety. 10/08/14  Yes  Debbe Odea, MD  amitriptyline (ELAVIL) 50 MG tablet Take 50 mg by mouth at bedtime.   Yes Historical Provider, MD  amLODipine (NORVASC) 10 MG tablet Take 10 mg by mouth at bedtime.    Yes Historical Provider, MD  aspirin 81 MG chewable tablet Chew 81 mg by mouth daily.   Yes Historical Provider, MD  B Complex-C (B-COMPLEX WITH VITAMIN C) tablet Take 1 tablet by mouth daily.   Yes Historical Provider, MD  bisacodyl (DULCOLAX) 10 MG suppository Place 10 mg rectally as needed for moderate constipation.   Yes Historical Provider, MD  carvedilol (COREG) 25 MG tablet Take 25 mg by mouth 2 (two) times daily with a meal. 0800 and 1700   Yes Historical Provider, MD  cloNIDine (CATAPRES) 0.2 MG tablet Take 0.2 mg by mouth 2 (two) times daily. Hold if blood pressure less than 100/60   Yes Historical Provider, MD  Cranberry 500 MG CAPS Take 1,000 mg by mouth 2 (two) times daily.   Yes Historical Provider, MD  docusate sodium (COLACE) 100 MG capsule Take 1 capsule (100 mg total) by mouth every 12 (twelve) hours. 08/07/14  Yes Kristen N Ward, DO  escitalopram (LEXAPRO) 20 MG tablet Take 20 mg by mouth daily.   Yes Historical Provider, MD  famotidine (PEPCID) 20 MG tablet Take 1 tablet (20 mg total) by mouth 2 (two) times daily as needed for heartburn or indigestion. 10/08/14  Yes Debbe Odea, MD  furosemide (LASIX) 40 MG tablet Take 40 mg by mouth daily.   Yes Historical Provider, MD  gabapentin (NEURONTIN) 300 MG capsule Take 1 capsule (300 mg total) by mouth 2 (two) times daily. 08/13/14  Yes Reyne Dumas, MD  guaiFENesin (ROBITUSSIN) 100 MG/5ML liquid Take 100 mg by mouth every 6 (six) hours as needed for cough. Not to exceed 4 doses in 24 hours   Yes Historical Provider, MD  HYDROcodone-acetaminophen (NORCO/VICODIN) 5-325 MG per tablet 1 by mouth every 6 hours as needed for moderate pain, 2 by mouth every 6 hours as needed for sever pain DO NOT EXCEED 4 GM OF APAP IN 24 HOURS 10/08/14  Yes Debbe Odea, MD   insulin glargine (LANTUS) 100 UNIT/ML injection Inject 0.4 mLs (40 Units total) into the skin daily. 08/13/14  Yes Reyne Dumas, MD  insulin lispro (HUMALOG) 100 UNIT/ML injection Inject 0-22 Units into the skin 3 (three) times daily with meals. CBG 0-90 0 units, 91-150 6 units, 151-200 10 units, 201-250 12 units, 251-300 14 units, 301-350 16 units, 351-400 18 units, 401-450 22 units, 451 or more 22 units and call md   Yes Historical Provider, MD  levothyroxine (SYNTHROID, LEVOTHROID) 50 MCG tablet Take 50 mcg by mouth daily before breakfast.    Yes Historical Provider, MD  linezolid (ZYVOX) 600 MG tablet  Take 1 tablet (600 mg total) by mouth every 12 (twelve) hours. 10/08/14  Yes Debbe Odea, MD  loperamide (IMODIUM) 2 MG capsule Take 2 mg by mouth as needed for diarrhea or loose stools. Do not exceed 8 doses in 24 hours   Yes Historical Provider, MD  ondansetron (ZOFRAN ODT) 4 MG disintegrating tablet Take 1 tablet (4 mg total) by mouth every 8 (eight) hours as needed for nausea or vomiting. 08/07/14  Yes Kristen N Ward, DO  pantoprazole (PROTONIX) 40 MG tablet Take 1 tablet (40 mg total) by mouth daily. 08/13/14  Yes Reyne Dumas, MD  polyethylene glycol (MIRALAX / GLYCOLAX) packet Take 17 g by mouth daily. Mix in 8 oz liquid and drink   Yes Historical Provider, MD  rosuvastatin (CRESTOR) 20 MG tablet Take 20 mg by mouth every evening. 4pm   Yes Historical Provider, MD  Skin Protectants, Misc. (MINERIN) CREA Apply 1 application topically 2 (two) times daily. Apply to both lower extremities and feet twice daily   Yes Historical Provider, MD  Vitamin D, Ergocalciferol, (DRISDOL) 50000 UNITS CAPS capsule Take 50,000 Units by mouth every 7 (seven) days. On Saturdays   Yes Historical Provider, MD  Alum & Mag Hydroxide-Simeth (GI COCKTAIL) SUSP suspension Take 30 mLs by mouth 3 (three) times daily as needed for indigestion. Shake well. Patient not taking: Reported on 10/09/2014 10/08/14   Debbe Odea, MD   alum & mag hydroxide-simeth (MAALOX/MYLANTA) 200-200-20 MG/5ML suspension Take 30 mLs by mouth every 6 (six) hours as needed for indigestion or heartburn. Do not exceed 4 doses in 24 hours    Historical Provider, MD   BP 139/43 mmHg  Pulse 50  Resp 12  SpO2 94% Physical Exam  Constitutional:  Chronically ill-appearing female. Spontaneous respirations. Not verbally responsive.  HENT:  Pupils 3 mm. Symmetric reactive. Positive gag reflex.  Eyes:  No scleral icterus. Conjunctiva not pale.  Neck:  Neck supple. JVD at 45.  Cardiovascular:  Sinus rhythm noted.  Pulmonary/Chest: She has wheezes. She has rhonchi. She has rales.  Tachypneic. Diffuse scattered rhonchi and basilar rales.  Abdominal:  Obese soft  Neurological: She is unresponsive.  Nonverbally responsive. Does not open her eyes to pain. Will not withdraw to pain. GCS 3 upon arrival.    ED Course  Procedures (including critical care time) Labs Review Labs Reviewed  CBC WITH DIFFERENTIAL/PLATELET - Abnormal; Notable for the following:    RBC 3.36 (*)    Hemoglobin 9.4 (*)    HCT 31.5 (*)    MCHC 29.8 (*)    Neutrophils Relative % 80 (*)    All other components within normal limits  BASIC METABOLIC PANEL - Abnormal; Notable for the following:    Potassium 5.5 (*)    Glucose, Bld 135 (*)    BUN 31 (*)    Creatinine, Ser 1.91 (*)    GFR calc non Af Amer 24 (*)    GFR calc Af Amer 28 (*)    All other components within normal limits  URINALYSIS, ROUTINE W REFLEX MICROSCOPIC (NOT AT [facility]) - Abnormal; Notable for the following:    APPearance CLOUDY (*)    Protein, ur >300 (*)    Leukocytes, UA SMALL (*)    All other components within normal limits  ACETAMINOPHEN LEVEL - Abnormal; Notable for the following:    Acetaminophen (Tylenol), Serum <10 (*)    All other components within normal limits  URINE MICROSCOPIC-ADD ON - Abnormal; Notable for the following:  Bacteria, UA FEW (*)    All other components within  normal limits  I-STAT ARTERIAL BLOOD GAS, ED - Abnormal; Notable for the following:    pH, Arterial 7.269 (*)    pCO2 arterial 58.6 (*)    Bicarbonate 26.9 (*)    All other components within normal limits  CBG MONITORING, ED - Abnormal; Notable for the following:    Glucose-Capillary 123 (*)    All other components within normal limits  I-STAT CG4 LACTIC ACID, ED - Abnormal; Notable for the following:    Lactic Acid, Venous 0.34 (*)    All other components within normal limits  CBG MONITORING, ED - Abnormal; Notable for the following:    Glucose-Capillary 122 (*)    All other components within normal limits  TROPONIN I    Imaging Review Dg Chest Port 1 View  10/09/2014   CLINICAL DATA:  Shortness of breath  EXAM: PORTABLE CHEST - 1 VIEW  COMPARISON:  10/03/2014  FINDINGS: Diffuse interstitial opacity. Despite a hazy density at the left base the lungs are fairly symmetric. No definitive pleural effusion. Normal heart size and stable mediastinal contours. Ventral and dorsal cervical spine fixation. No acute osseous findings.  IMPRESSION: Pulmonary edema, new from 10/03/2014.   Electronically Signed   By: Monte Fantasia M.D.   On: 10/09/2014 20:54     EKG Interpretation None      MDM   Final diagnoses:  SOB (shortness of breath)  Hypoglycemia  Altered level of consciousness    CRITICAL CARE Performed by: Tanna Furry JOSEPH   Total critical care time: 30 minutes  Critical care time was exclusive of separately billable procedures and treating other patients.  Critical care was necessary to treat or prevent imminent or life-threatening deterioration.  Critical care was time spent personally by me on the following activities: development of treatment plan with patient and/or surrogate as well as nursing, discussions with consultants, evaluation of patient's response to treatment, examination of patient, obtaining history from patient or surrogate, ordering and performing  treatments and interventions, ordering and review of laboratory studies, ordering and review of radiographic studies, pulse oximetry and re-evaluation of patient's condition.   On arrival patient not responsive. Oxygenating. Was weaned from her mask  for [place] cannula. Recheck blood sugar was 160.    At this point patient was depressed with her mental status the point of requiring airway protection. Although no marked narcotic doses listed she was given a trial of IV Narcan.  Was given Narcan 1 mg IV push and becomes immediately responsive. Opens her eyes and begins talking. Prior to this had an ABG obtained. Did not respond to the needlestick. Is not hypercapnic.  Review of her medications the only narcotic listed is Vicodin 5/325 one every 6 hours. Also lists Xanax. No other sedative medications noted. Given albuterol here. Chest x-ray shows CHF. She is not hypertensive or hypoxemic. Renal insufficiency on her most recent admission up to 2.2. Creatinine 1.9 today. Given 80 of IV Lasix.  Make her situation size multifactorial. Likely related to her CHF. Maybe hypoxemic. Hypoglycemic.    Tanna Furry, MD 10/09/14 2209  Tanna Furry, MD 10/09/14 [phone]

## 2014-10-09 NOTE — ED Notes (Signed)
Xray at Bedside

## 2014-10-10 LAB — BASIC METABOLIC PANEL
Anion gap: 12 (ref 5–15)
BUN: 30 mg/dL — ABNORMAL HIGH (ref 6–20)
CHLORIDE: 105 mmol/L (ref 101–111)
CO2: 26 mmol/L (ref 22–32)
Calcium: 9.4 mg/dL (ref 8.9–10.3)
Creatinine, Ser: 1.81 mg/dL — ABNORMAL HIGH (ref 0.44–1.00)
GFR calc Af Amer: 30 mL/min — ABNORMAL LOW (ref >60)
GFR calc non Af Amer: 26 mL/min — ABNORMAL LOW (ref >60)
GLUCOSE: 117 mg/dL — AB (ref 65–99)
Potassium: 4.5 mmol/L (ref 3.5–5.1)
Sodium: 143 mmol/L (ref 135–145)

## 2014-10-10 LAB — TROPONIN I
Troponin I: <0.03 ng/mL (ref <0.031)
Troponin I: <0.03 ng/mL (ref <0.031)

## 2014-10-10 LAB — GLUCOSE, CAPILLARY
GLUCOSE-CAPILLARY: 132 mg/dL — AB (ref 65–99)
GLUCOSE-CAPILLARY: 134 mg/dL — AB (ref 65–99)
Glucose-Capillary: 140 mg/dL — ABNORMAL HIGH (ref 65–99)
Glucose-Capillary: 125 mg/dL — ABNORMAL HIGH (ref 65–99)
Glucose-Capillary: 113 mg/dL — ABNORMAL HIGH (ref 65–99)
Glucose-Capillary: 108 mg/dL — ABNORMAL HIGH (ref 65–99)
Glucose-Capillary: 110 mg/dL — ABNORMAL HIGH (ref 65–99)
Glucose-Capillary: 174 mg/dL — ABNORMAL HIGH (ref 65–99)

## 2014-10-10 LAB — PROTIME-INR
INR: 1.12 (ref 0.00–1.49)
Prothrombin Time: 14.5 seconds (ref 11.6–15.2)

## 2014-10-10 LAB — BRAIN NATRIURETIC PEPTIDE: B Natriuretic Peptide: 479.9 pg/mL — ABNORMAL HIGH (ref 0.0–100.0)

## 2014-10-10 LAB — MAGNESIUM: Magnesium: 2.3 mg/dL (ref 1.7–2.4)

## 2014-10-10 MED ORDER — ONDANSETRON HCL 4 MG/2ML IJ SOLN: 4.0000 mg | Freq: Four times a day (QID) | INTRAMUSCULAR | Status: DC | PRN

## 2014-10-10 MED ORDER — B COMPLEX-C PO TABS
1.0000 | ORAL_TABLET | Freq: Every day | ORAL | Status: DC
  Administered 2014-10-10 – 2014-10-13 (×4): 1 via ORAL
  Filled 2014-10-10 (×4): qty 1

## 2014-10-10 MED ORDER — DEXTROSE 5 % IV SOLN
1.0000 g | Freq: Two times a day (BID) | INTRAVENOUS | Status: DC
  Administered 2014-10-10 – 2014-10-13 (×8): 1 g via INTRAVENOUS
  Filled 2014-10-10 (×10): qty 1

## 2014-10-10 MED ORDER — CLONIDINE HCL 0.1 MG PO TABS
0.2000 mg | ORAL_TABLET | Freq: Three times a day (TID) | ORAL | Status: DC
  Administered 2014-10-10 – 2014-10-13 (×9): 0.2 mg via ORAL
  Filled 2014-10-10: qty 1
  Filled 2014-10-10: qty 2
  Filled 2014-10-10: qty 1
  Filled 2014-10-10 (×2): qty 2
  Filled 2014-10-10 (×2): qty 1
  Filled 2014-10-10: qty 2
  Filled 2014-10-10: qty 1
  Filled 2014-10-10: qty 2

## 2014-10-10 MED ORDER — AZTREONAM 1 G IJ SOLR: 1.0000 g | Freq: Two times a day (BID) | INTRAMUSCULAR | Status: DC

## 2014-10-10 MED ORDER — CLONIDINE HCL 0.2 MG PO TABS
0.2000 mg | ORAL_TABLET | Freq: Two times a day (BID) | ORAL | Status: DC
  Filled 2014-10-10 (×3): qty 1

## 2014-10-10 MED ORDER — DEXTROSE-NACL 5-0.45 % IV SOLN
INTRAVENOUS | Status: DC
  Administered 2014-10-10 (×2): via INTRAVENOUS

## 2014-10-10 MED ORDER — AMLODIPINE BESYLATE 10 MG PO TABS
10.0000 mg | ORAL_TABLET | Freq: Every day | ORAL | Status: DC
  Administered 2014-10-10 – 2014-10-12 (×3): 10 mg via ORAL
  Filled 2014-10-10 (×4): qty 1

## 2014-10-10 MED ORDER — ASPIRIN 81 MG PO CHEW
81.0000 mg | CHEWABLE_TABLET | Freq: Every day | ORAL | Status: DC
  Administered 2014-10-10: 81 mg via ORAL
  Filled 2014-10-10: qty 1

## 2014-10-10 MED ORDER — CARVEDILOL 6.25 MG PO TABS
6.2500 mg | ORAL_TABLET | Freq: Two times a day (BID) | ORAL | Status: DC
  Administered 2014-10-11 – 2014-10-13 (×5): 6.25 mg via ORAL
  Filled 2014-10-10 (×10): qty 1

## 2014-10-10 MED ORDER — GABAPENTIN 300 MG PO CAPS
300.0000 mg | ORAL_CAPSULE | Freq: Two times a day (BID) | ORAL | Status: DC
  Administered 2014-10-10 (×2): 300 mg via ORAL
  Filled 2014-10-10 (×5): qty 1

## 2014-10-10 MED ORDER — ALPRAZOLAM 0.25 MG PO TABS: 0.2500 mg | ORAL_TABLET | Freq: Two times a day (BID) | ORAL | Status: DC | PRN

## 2014-10-10 MED ORDER — AMITRIPTYLINE HCL 50 MG PO TABS
50.0000 mg | ORAL_TABLET | Freq: Every day | ORAL | Status: DC
  Filled 2014-10-10 (×2): qty 1

## 2014-10-10 MED ORDER — SODIUM CHLORIDE 0.9 % IV SOLN: 250.0000 mL | INTRAVENOUS | Status: DC | PRN

## 2014-10-10 MED ORDER — SODIUM CHLORIDE 0.9 % IJ SOLN: 3.0000 mL | INTRAMUSCULAR | Status: DC | PRN

## 2014-10-10 MED ORDER — LOPERAMIDE HCL 2 MG PO CAPS: 2.0000 mg | ORAL_CAPSULE | ORAL | Status: DC | PRN

## 2014-10-10 MED ORDER — DEXTROSE 50 % IV SOLN: 25.0000 mL | INTRAVENOUS | Status: DC | PRN

## 2014-10-10 MED ORDER — FAMOTIDINE 20 MG PO TABS
20.0000 mg | ORAL_TABLET | Freq: Two times a day (BID) | ORAL | Status: DC | PRN
  Filled 2014-10-10: qty 1

## 2014-10-10 MED ORDER — ALBUTEROL SULFATE (2.5 MG/3ML) 0.083% IN NEBU: 2.5000 mg | INHALATION_SOLUTION | RESPIRATORY_TRACT | Status: DC | PRN

## 2014-10-10 MED ORDER — FUROSEMIDE 40 MG PO TABS
40.0000 mg | ORAL_TABLET | Freq: Every day | ORAL | Status: DC
  Filled 2014-10-10: qty 1

## 2014-10-10 MED ORDER — CETYLPYRIDINIUM CHLORIDE 0.05 % MT LIQD
7.0000 mL | Freq: Two times a day (BID) | OROMUCOSAL | Status: DC
  Administered 2014-10-10 – 2014-10-13 (×8): 7 mL via OROMUCOSAL

## 2014-10-10 MED ORDER — ACETAMINOPHEN 500 MG PO TABS
500.0000 mg | ORAL_TABLET | Freq: Three times a day (TID) | ORAL | Status: DC
  Administered 2014-10-10: 500 mg via ORAL
  Filled 2014-10-10 (×3): qty 1

## 2014-10-10 MED ORDER — SODIUM CHLORIDE 0.9 % IJ SOLN
3.0000 mL | Freq: Two times a day (BID) | INTRAMUSCULAR | Status: DC
  Administered 2014-10-10: 3 mL via INTRAVENOUS

## 2014-10-10 MED ORDER — HYDRALAZINE HCL 20 MG/ML IJ SOLN
5.0000 mg | INTRAMUSCULAR | Status: DC | PRN
  Administered 2014-10-10: 5 mg via INTRAVENOUS
  Filled 2014-10-10: qty 1

## 2014-10-10 MED ORDER — INSULIN ASPART 100 UNIT/ML ~~LOC~~ SOLN
0.0000 [IU] | Freq: Three times a day (TID) | SUBCUTANEOUS | Status: DC
Start: 2014-10-10 — End: 2014-10-13
  Administered 2014-10-11: 2 [IU] via SUBCUTANEOUS
  Administered 2014-10-11: 3 [IU] via SUBCUTANEOUS
  Administered 2014-10-12: 2 [IU] via SUBCUTANEOUS
  Administered 2014-10-12: 3 [IU] via SUBCUTANEOUS
  Administered 2014-10-12 – 2014-10-13 (×2): 2 [IU] via SUBCUTANEOUS
  Administered 2014-10-13: 3 [IU] via SUBCUTANEOUS

## 2014-10-10 MED ORDER — AMITRIPTYLINE HCL 25 MG PO TABS
25.0000 mg | ORAL_TABLET | Freq: Every day | ORAL | Status: DC
  Administered 2014-10-10 – 2014-10-12 (×3): 25 mg via ORAL
  Filled 2014-10-10 (×3): qty 1

## 2014-10-10 MED ORDER — ESCITALOPRAM OXALATE 10 MG PO TABS
20.0000 mg | ORAL_TABLET | Freq: Every day | ORAL | Status: DC
  Administered 2014-10-10 – 2014-10-13 (×4): 20 mg via ORAL
  Filled 2014-10-10 (×2): qty 1
  Filled 2014-10-10 (×2): qty 2

## 2014-10-10 MED ORDER — ROSUVASTATIN CALCIUM 20 MG PO TABS
20.0000 mg | ORAL_TABLET | Freq: Every evening | ORAL | Status: DC
  Administered 2014-10-10 – 2014-10-12 (×2): 20 mg via ORAL
  Filled 2014-10-10 (×4): qty 1

## 2014-10-10 MED ORDER — ASPIRIN EC 81 MG PO TBEC
81.0000 mg | DELAYED_RELEASE_TABLET | Freq: Every day | ORAL | Status: DC
  Administered 2014-10-11 – 2014-10-13 (×3): 81 mg via ORAL
  Filled 2014-10-10 (×4): qty 1

## 2014-10-10 MED ORDER — FUROSEMIDE 10 MG/ML IJ SOLN
80.0000 mg | Freq: Every day | INTRAMUSCULAR | Status: DC
  Administered 2014-10-10: 80 mg via INTRAVENOUS
  Filled 2014-10-10: qty 8

## 2014-10-10 MED ORDER — BISACODYL 10 MG RE SUPP
10.0000 mg | Freq: Every day | RECTAL | Status: DC | PRN
  Filled 2014-10-10: qty 1

## 2014-10-10 MED ORDER — HEPARIN SODIUM (PORCINE) 5000 UNIT/ML IJ SOLN
5000.0000 [IU] | Freq: Three times a day (TID) | INTRAMUSCULAR | Status: DC
  Administered 2014-10-10 – 2014-10-13 (×9): 5000 [IU] via SUBCUTANEOUS
  Filled 2014-10-10 (×9): qty 1

## 2014-10-10 MED ORDER — LEVOTHYROXINE SODIUM 50 MCG PO TABS
50.0000 ug | ORAL_TABLET | Freq: Every day | ORAL | Status: DC
  Administered 2014-10-11 – 2014-10-13 (×3): 50 ug via ORAL
  Filled 2014-10-10 (×5): qty 1

## 2014-10-10 MED ORDER — DOCUSATE SODIUM 100 MG PO CAPS
100.0000 mg | ORAL_CAPSULE | Freq: Two times a day (BID) | ORAL | Status: DC
  Administered 2014-10-10 – 2014-10-13 (×7): 100 mg via ORAL
  Filled 2014-10-10 (×7): qty 1

## 2014-10-10 MED ORDER — POLYETHYLENE GLYCOL 3350 17 G PO PACK
17.0000 g | PACK | Freq: Every day | ORAL | Status: DC | PRN
  Filled 2014-10-10: qty 1

## 2014-10-10 MED ORDER — ACETAMINOPHEN 325 MG PO TABS: 650.0000 mg | ORAL_TABLET | ORAL | Status: DC | PRN

## 2014-10-10 MED ORDER — GUAIFENESIN 100 MG/5ML PO SOLN
100.0000 mg | Freq: Four times a day (QID) | ORAL | Status: DC | PRN
  Filled 2014-10-10: qty 5

## 2014-10-10 MED ORDER — HYDROCERIN EX CREA
1.0000 "application " | TOPICAL_CREAM | Freq: Two times a day (BID) | CUTANEOUS | Status: DC
  Administered 2014-10-10 – 2014-10-13 (×7): 1 via TOPICAL
  Filled 2014-10-10: qty 113

## 2014-10-10 MED ORDER — FAMOTIDINE IN NACL 20-0.9 MG/50ML-% IV SOLN
20.0000 mg | Freq: Two times a day (BID) | INTRAVENOUS | Status: DC
  Administered 2014-10-10 (×2): 20 mg via INTRAVENOUS
  Filled 2014-10-10 (×3): qty 50

## 2014-10-10 NOTE — Evaluation (Signed)
Physical Therapy Evaluation Patient Details Name: MARYLYNNE KEELIN MRN: [id number] DOB: Aug 10, 1934 Today's Date: 10/10/2014   History of Present Illness  Pt is a 80 y.o. female, with a past medical history of COPD, TIA, diabetes with neuropathy, who has lived at [facility] for a couple of years. She was brought to the ER with encephalopathy and hypoglycemia after recent D/c 5/25  Clinical Impression  Pt pleasant and states she hasn't walked due to pain and balance. However pt able to get up and walk across room with RW and cues with chair to follow. Pt states she doesn't receive therapy at ALF and no one has been helping her get better. Pt with decreased strength, function and mobility but demonstrates significant ability to progress and will benefit from acute therapy as well as SNF to maximize strength and function to increased independence and decrease burden of care.     Follow Up Recommendations SNF;Supervision/Assistance - 24 hour    Equipment Recommendations  None recommended by PT    Recommendations for Other Services OT consult     Precautions / Restrictions Precautions Precautions: Fall      Mobility  Bed Mobility Overal bed mobility: Needs Assistance Bed Mobility: Supine to Sit     Supine to sit: Min assist     General bed mobility comments: cues for sequence, use of rail and increased time  Transfers Overall transfer level: Needs assistance   Transfers: Sit to/from Stand;Stand Pivot Transfers Sit to Stand: Min assist Stand pivot transfers: Min assist       General transfer comment: pt with cues for hand placement and assist for anterior translation with standing from bed, pt holding P.T. arms for pivot to chair then stand again from chair. Pt not reaching back for surface to sit despite cues  Ambulation/Gait Ambulation/Gait assistance: Min assist;+2 safety/equipment Ambulation Distance (Feet): 20 Feet Assistive device: Rolling walker (2  wheeled) Gait Pattern/deviations: Step-through pattern;Decreased stride length;Trunk flexed   Gait velocity interpretation: Below normal speed for age/gender General Gait Details: cues for posture and position in RW with chair followed due to fatigue  Stairs            Wheelchair Mobility    Modified Rankin (Stroke Patients Only)       Balance Overall balance assessment: Needs assistance   Sitting balance-Leahy Scale: Fair       Standing balance-Leahy Scale: Poor                               Pertinent Vitals/Pain Pain Assessment: 0-10 Pain Score: 4  Pain Location: ankle pain Pain Descriptors / Indicators: Aching Pain Intervention(s): Repositioned;Limited activity within patient's tolerance  VSS on RA    Home Living Family/patient expects to be discharged to:: Assisted living               Home Equipment: Walker - 2 wheels;Shower seat;Hospital bed;Wheelchair - manual Additional Comments: [facility]    Prior Function Level of Independence: Needs assistance   Gait / Transfers Assistance Needed: per pt and granddgtr she has 2 people help her transfer bed to [facility] and from [facility] to car when going out with family. She reports she has not walked for several months but would like to  ADL's / Homemaking Assistance Needed: pt reports staff provide max assist for her bathing and dressing with shower seat        Hand Dominance  Extremity/Trunk Assessment   Upper Extremity Assessment: Generalized weakness           Lower Extremity Assessment: Generalized weakness      Cervical / Trunk Assessment: Normal  Communication   Communication: No difficulties  Cognition Arousal/Alertness: Awake/alert Behavior During Therapy: WFL for tasks assessed/performed Overall Cognitive Status: Within Functional Limits for tasks assessed                      General Comments      Exercises        Assessment/Plan    PT  Assessment Patient needs continued PT services  PT Diagnosis Difficulty walking;Generalized weakness   PT Problem List Decreased strength;Decreased activity tolerance;Decreased balance;Decreased mobility;Decreased knowledge of precautions;Decreased safety awareness;Decreased knowledge of use of DME  PT Treatment Interventions DME instruction;Gait training;Functional mobility training;Therapeutic activities;Therapeutic exercise;Patient/family education;Balance training   PT Goals (Current goals can be found in the Care Plan section) Acute Rehab PT Goals Patient Stated Goal: be able to walk PT Goal Formulation: With patient/family Time For Goal Achievement: 10/24/14 Potential to Achieve Goals: Good    Frequency Min 3X/week   Barriers to discharge Decreased caregiver support      Co-evaluation               End of Session Equipment Utilized During Treatment: Gait belt Activity Tolerance: Patient tolerated treatment well Patient left: in chair;with call bell/phone within reach;with chair alarm set;with family/visitor present Nurse Communication: Mobility status         Time: 3212-2482 PT Time Calculation (min) (ACUTE ONLY): 26 min   Charges:   PT Evaluation $Initial PT Evaluation Tier I: 1 Procedure PT Treatments $Gait Training: 8-22 mins   PT G CodesMelford Aase 10/10/2014, 11:39 AM Elwyn Reach, [place]

## 2014-10-10 NOTE — Progress Notes (Signed)
[place] TEAM 1 - Stepdown/ICU TEAM Progress Note  [name] UUV:[id number] DOB: July 05, 1934 DOA: 10/09/2014 PCP: No primary care provider on file.  Admit HPI / Brief Narrative: 80 y.o. female with Hx of hypertension, hyperlipidemia, GERD, hypothyroidism, diabetes mellitus, COPD, peripheral vascular disease, chronic kidney disease stage III, TIA, carotid artery stenosis, tobacco abuse, and diastolic congestive heart failure who presented with altered mental status and hypoglycemia.  The patient was found unresponsive by staff in SNF w/ a cbg of 32 which improved to 222 after D50.   In ED patient was found to have WBC 7.4, temperature 98.9, bradycardia, urinalysis w/ small leukocytes, negative troponin, lactate 0.34, potassium 5.5 without T-wave peaking, stable renal function. Chest x-ray showed new pulmonary edema. ABG showed pH 7.269, PCO2 58.6, PaO2 85%.   Patient was hospitalized from 5/22-25 because of near syncope, acute on chronic renal injury, bradycardia, and Enterococcus UTI. She was discharged in stable condition with improved renal function. She continued Zyvox for UTI (until 5/30 per d/c summry). She had negative MRI of brain in during that admission.  HPI/Subjective: The patient is alert and oriented.  She is conversant and quite pleasant.  She is very hungry and wishes to eat.  She denies chest pain nausea vomiting fevers chills shortness of breath or abdominal pain.  Assessment/Plan:  Acute encephalopathy Appears to be primarily related to hypoglycemia - patient appears to return to her baseline mental status - continue to follow on stepdown unit 24 hours additional to assure refractory hypoglycemia has resolved  UTI - recent Enterococcus on culture  Continue current antibiotics  Acute hypercarbic respiratory failure  Due to somnolence related to hypoglycemia - resolved with no respiratory distress at this time  Hyperkalemia  Resolved   DM w/ severe hypoglycemia    Exact cause of the patient's refractory hypoglycemia not clear - perhaps this was simply poor oral intake due to recent UTI in face of ongoing Lantus therapy  Hypothyroidism  Hyperlipidemia  Stable chronic diastolic congestive heart failure without acute exacerbation Baseline weight appears to be approximately 90 kg - patient currently 88 kg with no clinical evidence to suggest significant volume overload  HTN BP poorly controlled - adjust tx and follow   CKD stage III Baseline creatinine 1.5-2 - renal function at baseline  COPD Well compensated at present  Code Status: NO CODE BLUE / DNR  Family Communication: no family present at time of exam Disposition Plan: SDU  Consultants: none  Procedures: none  Antibiotics: Aztreonam 5/26 >  DVT prophylaxis: SQ heparin   Objective: Blood pressure 175/45, pulse 73, temperature 98.4 F (36.9 C), temperature source Oral, resp. rate 17, height 5\' 3"  (1.6 m), weight 88 kg (194 lb 0.1 oz), SpO2 91 %.  Intake/Output Summary (Last 24 hours) at 10/10/14 1029 Last data filed at 10/10/14 0749  Gross per 24 hour  Intake    350 ml  Output    710 ml  Net   -360 ml   Exam: General: No acute respiratory distress - alert and conversant Lungs: Clear to auscultation bilaterally without wheezes or crackles - breath sounds distant Cardiovascular: Regular rate and rhythm without murmur gallop or rub normal S1 and S2 Abdomen: Nontender, nondistended, soft, bowel sounds positive, no rebound, no ascites, no appreciable mass Extremities: No significant cyanosis, clubbing, or edema bilateral lower extremities  Data Reviewed: Basic Metabolic Panel:  Recent Labs Lab 10/05/14 1123 10/06/14 0330 10/07/14 0346 10/09/14 2015 10/10/14 0717 10/10/14 0727  NA 139 140  143 142 143  --   K 4.8 4.5 4.9 5.5* 4.5  --   CL 103 106 109 107 105  --   CO2 25 27 24 24 26   --   GLUCOSE 116* 146* 190* 135* 117*  --   BUN 45* 44* 35* 31* 30*  --    CREATININE 2.25* 2.01* 1.79* 1.91* 1.81*  --   CALCIUM 9.4 8.8* 9.5 9.2 9.4  --   MG  --   --   --   --   --  2.3    CBC:  Recent Labs Lab 10/03/14 1218 10/05/14 1123 10/06/14 0330 10/09/14 2015  WBC 7.9 8.2 7.7 7.4  NEUTROABS 4.2 4.1  --  5.9  HGB 11.7* 10.5* 9.1* 9.4*  HCT 38.0 33.9* 30.0* 31.5*  MCV 92.2 92.9 92.6 93.8  PLT 194 215 211 234    Liver Function Tests:  Recent Labs Lab 10/03/14 1218 10/06/14 0330 10/07/14 1645  AST 20 13* 17  ALT 14 12* 12*  ALKPHOS 62 56 61  BILITOT 0.3 0.3 0.6  PROT 7.3 6.3* 6.1*  ALBUMIN 3.5 2.9* 3.0*    Recent Labs Lab 10/07/14 1645  LIPASE <10*   Coags:  Recent Labs Lab 10/10/14 0141  INR 1.12   Cardiac Enzymes:  Recent Labs Lab 10/03/14 1218 10/09/14 2015 10/10/14 0141 10/10/14 0727  TROPONINI <0.03 <0.03 <0.03 <0.03    CBG:  Recent Labs Lab 10/10/14 0105 10/10/14 0226 10/10/14 0428 10/10/14 0534 10/10/14 0747  GLUCAP 140* 125* 132* 134* 113*    Recent Results (from the past 240 hour(s))  Urine culture     Status: None   Collection Time: 10/03/14  1:58 PM  Result Value Ref Range Status   Specimen Description URINE, CLEAN CATCH  Final   Special Requests NONE  Final   Colony Count   Final    >=100,000 COLONIES/ML Performed at [facility]    Culture   Final    Multiple bacterial morphotypes present, none predominant. Suggest appropriate recollection if clinically indicated. Performed at [facility]    Report Status 10/04/2014 FINAL  Final  Urine culture     Status: None   Collection Time: 10/05/14 11:52 AM  Result Value Ref Range Status   Specimen Description URINE, CATHETERIZED  Final   Special Requests Normal  Final   Colony Count   Final    80,000 COLONIES/ML Performed at [facility]    Culture   Final    ENTEROCOCCUS SPECIES Performed at [facility]    Report Status 10/07/2014 FINAL  Final   Organism ID, Bacteria ENTEROCOCCUS SPECIES  Final       Susceptibility   Enterococcus species - MIC*    AMPICILLIN <=2 SENSITIVE Sensitive     LEVOFLOXACIN >=8 RESISTANT Resistant     NITROFURANTOIN <=16 SENSITIVE Sensitive     VANCOMYCIN 1 SENSITIVE Sensitive     TETRACYCLINE >=16 RESISTANT Resistant     * ENTEROCOCCUS SPECIES  MRSA PCR Screening     Status: None   Collection Time: 10/05/14  7:30 PM  Result Value Ref Range Status   MRSA by PCR NEGATIVE NEGATIVE Final    Comment:        The GeneXpert MRSA Assay (FDA approved for NASAL specimens only), is one component of a comprehensive MRSA colonization surveillance program. It is not intended to diagnose MRSA infection nor to guide or monitor treatment for MRSA infections.   Culture, blood (routine  x 2)     Status: None (Preliminary result)   Collection Time: 10/10/14  1:41 AM  Result Value Ref Range Status   Specimen Description BLOOD LEFT ANTECUBITAL  Final   Special Requests BOTTLES DRAWN AEROBIC ONLY 5CC  Final   Culture PENDING  Incomplete   Report Status PENDING  Incomplete  Culture, blood (routine x 2)     Status: None (Preliminary result)   Collection Time: 10/10/14  1:45 AM  Result Value Ref Range Status   Specimen Description BLOOD FOREARM LEFT  Final   Special Requests BOTTLES DRAWN AEROBIC ONLY 10CC  Final   Culture PENDING  Incomplete   Report Status PENDING  Incomplete     Studies:   Recent x-ray studies have been reviewed in detail by the Attending Physician  Scheduled Meds:  Scheduled Meds: . acetaminophen  500 mg Oral 3 times per day  . amitriptyline  50 mg Oral QHS  . amLODipine  10 mg Oral QHS  . antiseptic oral rinse  7 mL Mouth Rinse BID  . aspirin  81 mg Oral Daily  . aspirin EC  81 mg Oral Daily  . aztreonam  1 g Intravenous Q12H  . B-complex with vitamin C  1 tablet Oral Daily  . carvedilol  6.25 mg Oral BID WC  . cloNIDine  0.2 mg Oral BID  . docusate sodium  100 mg Oral BID  . escitalopram  20 mg Oral Daily  . famotidine (PEPCID)  IV  20 mg Intravenous Q12H  . furosemide  80 mg Intravenous Daily  . gabapentin  300 mg Oral BID  . heparin  5,000 Units Subcutaneous 3 times per day  . hydrocerin  1 application Topical BID  . insulin aspart  0-9 Units Subcutaneous TID WC  . levothyroxine  50 mcg Oral QAC breakfast  . naLOXone (NARCAN)  injection  0.4 mg Intravenous Once  . rosuvastatin  20 mg Oral QPM  . sodium chloride  3 mL Intravenous Q12H    Time spent on care of this patient: 35 mins   MCCLUNG,JEFFREY T , MD   [facility] Office  [phone] Pager - Text Page per Shea Evans as per below:  On-Call/Text Page:      [email or web address]      password [email or web address]  If 7PM-7AM, please contact night-coverage [email or web address] Password [email or web address] 10/10/2014, 10:29 AM   LOS: 1 day

## 2014-10-10 NOTE — Progress Notes (Signed)
Dr. Thereasa Solo aware of elevated BP. Adjustments made to BP meds by MD.

## 2014-10-10 NOTE — Evaluation (Signed)
Occupational Therapy Evaluation Patient Details Name: [name] MRN: [id number] DOB: 1934/12/01 Today's Date: 10/10/2014    History of Present Illness Pt is a 80 y.o. female, with a past medical history of COPD, TIA, diabetes with neuropathy, who has lived at [facility] for a couple of years. She was brought to the ER with encephalopathy and hypoglycemia after recent D/c 5/25   Clinical Impression   Pt was assisted maximally for bathing, dressing and toileting at ALF and did not ambulate.  She presents with decreased activity tolerance, generalized weakness, and impaired balance interfering with ability to perform ADL and ADL transfers.  With therapy, pt has the potential to participate far more in mobility and ADL, increase safety and decrease burden on her caregivers.  Recommending SNF for ST rehab.  Will follow acutely.    Follow Up Recommendations  SNF;Supervision/Assistance - 24 hour    Equipment Recommendations  None recommended by OT    Recommendations for Other Services       Precautions / Restrictions Precautions Precautions: Fall      Mobility Bed Mobility      General bed mobility comments: pt up in chair  Transfers Overall transfer level: Needs assistance Equipment used: Rolling walker (2 wheeled) Transfers: Sit to/from Stand Sit to Stand: Min assist Stand pivot transfers: Min assist       General transfer comment: Assist to rise and for anterior translation, cues for hand placement and to control descent    Balance Overall balance assessment: Needs assistance   Sitting balance-Leahy Scale: Fair       Standing balance-Leahy Scale: Poor (unable to release walker, posterior lean)                              ADL Overall ADL's : Needs assistance/impaired Eating/Feeding: NPO   Grooming: Wash/dry hands;Wash/dry face;Brushing hair;Sitting;Set up   Upper Body Bathing: Minimal assitance;Sitting   Lower Body Bathing:  Moderate assistance;Sit to/from stand   Upper Body Dressing : Set up;Sitting   Lower Body Dressing: Moderate assistance;Sit to/from stand       Toileting- Water quality scientist and Hygiene: Total assistance;Sit to/from stand         General ADL Comments: Pt able to donn and doff socks at edge of chair.     Vision     Perception     Praxis      Pertinent Vitals/Pain Pain Assessment: No/denies pain Pain Score: 4  Pain Location: ankle pain Pain Descriptors / Indicators: Aching Pain Intervention(s): Repositioned;Limited activity within patient's tolerance     Hand Dominance Right   Extremity/Trunk Assessment Upper Extremity Assessment Upper Extremity Assessment: Generalized weakness;Overall [facility] for tasks assessed   Lower Extremity Assessment Lower Extremity Assessment: Defer to PT evaluation   Cervical / Trunk Assessment Cervical / Trunk Assessment: Normal   Communication Communication Communication: No difficulties   Cognition Arousal/Alertness: Awake/alert Behavior During Therapy: WFL for tasks assessed/performed Overall Cognitive Status: Within Functional Limits for tasks assessed                     General Comments       Exercises       Shoulder Instructions      Home Living Family/patient expects to be discharged to:: Assisted living  Home Equipment: [place] - 2 wheels;Shower seat;Hospital bed;Wheelchair - manual   Additional Comments: [facility]      Prior Functioning/Environment Level of Independence: Needs assistance  Gait / Transfers Assistance Needed: per pt and granddgtr she has 2 people help her transfer bed to [facility] and from [facility] to car when going out with family. She reports she has not walked for several months but would like to ADL's / Homemaking Assistance Needed: pt reports staff provide max assist for her bathing and dressing with shower seat        OT Diagnosis: Generalized  weakness   OT Problem List: Decreased strength;Decreased activity tolerance;Impaired balance (sitting and/or standing);Decreased safety awareness;Decreased knowledge of use of DME or AE   OT Treatment/Interventions: Self-care/ADL training;Therapeutic exercise;Energy conservation;DME and/or AE instruction;Therapeutic activities;Patient/family education;Balance training    OT Goals(Current goals can be found in the care plan section) Acute Rehab OT Goals Patient Stated Goal: be able to walk OT Goal Formulation: With patient Time For Goal Achievement: 10/24/14 Potential to Achieve Goals: Good ADL Goals Pt Will Transfer to Toilet: with min guard assist;ambulating;bedside commode Pt Will Perform Toileting - Clothing Manipulation and hygiene: with min guard assist;sit to/from stand Additional ADL Goal #1: Pt will stand without UE support x 30 seconds as a precursor to standing ADL.  OT Frequency: Min 2X/week   Barriers to D/C:            Co-evaluation              End of Session Equipment Utilized During Treatment: Rolling walker;Gait belt  Activity Tolerance: Patient tolerated treatment well Patient left: in chair;with call bell/phone within reach;with family/visitor present   Time: 1203-1226 OT Time Calculation (min): 23 min Charges:  OT General Charges $OT Visit: 1 Procedure OT Evaluation $Initial OT Evaluation Tier I: 1 Procedure OT Treatments $Self Care/Home Management : 8-22 mins G-Codes:    Malka So 10/10/2014, 1:16 PM [phone]

## 2014-10-11 LAB — HEPATIC FUNCTION PANEL
ALK PHOS: 58 U/L (ref 38–126)
ALT: 10 U/L — AB (ref 14–54)
AST: 14 U/L — ABNORMAL LOW (ref 15–41)
Albumin: 2.7 g/dL — ABNORMAL LOW (ref 3.5–5.0)
BILIRUBIN TOTAL: 0.5 mg/dL (ref 0.3–1.2)
Bilirubin, Direct: <0.1 mg/dL — ABNORMAL LOW (ref 0.1–0.5)
TOTAL PROTEIN: 6 g/dL — AB (ref 6.5–8.1)

## 2014-10-11 LAB — GLUCOSE, CAPILLARY
GLUCOSE-CAPILLARY: 162 mg/dL — AB (ref 65–99)
GLUCOSE-CAPILLARY: 172 mg/dL — AB (ref 65–99)
Glucose-Capillary: 102 mg/dL — ABNORMAL HIGH (ref 65–99)
Glucose-Capillary: 89 mg/dL (ref 65–99)
Glucose-Capillary: 221 mg/dL — ABNORMAL HIGH (ref 65–99)
Glucose-Capillary: 255 mg/dL — ABNORMAL HIGH (ref 65–99)
Glucose-Capillary: 236 mg/dL — ABNORMAL HIGH (ref 65–99)

## 2014-10-11 LAB — CBC
HCT: 31.3 % — ABNORMAL LOW (ref 36.0–46.0)
Hemoglobin: 9.7 g/dL — ABNORMAL LOW (ref 12.0–15.0)
MCH: 28.5 pg (ref 26.0–34.0)
MCHC: 31 g/dL (ref 30.0–36.0)
MCV: 92.1 fL (ref 78.0–100.0)
PLATELETS: 260 10*3/uL (ref 150–400)
RBC: 3.4 MIL/uL — ABNORMAL LOW (ref 3.87–5.11)
RDW: 14.1 % (ref 11.5–15.5)
WBC: 8.1 10*3/uL (ref 4.0–10.5)

## 2014-10-11 LAB — BASIC METABOLIC PANEL
Anion gap: 8 (ref 5–15)
BUN: 29 mg/dL — AB (ref 6–20)
CO2: 26 mmol/L (ref 22–32)
Calcium: 8.9 mg/dL (ref 8.9–10.3)
Chloride: 105 mmol/L (ref 101–111)
Creatinine, Ser: 1.67 mg/dL — ABNORMAL HIGH (ref 0.44–1.00)
GFR calc Af Amer: 33 mL/min — ABNORMAL LOW (ref >60)
GFR, EST NON AFRICAN AMERICAN: 28 mL/min — AB (ref >60)
GLUCOSE: 110 mg/dL — AB (ref 65–99)
Potassium: 4.2 mmol/L (ref 3.5–5.1)
Sodium: 139 mmol/L (ref 135–145)

## 2014-10-11 LAB — URINE CULTURE
COLONY COUNT: NO GROWTH
Culture: NO GROWTH

## 2014-10-11 MED ORDER — GABAPENTIN 100 MG PO CAPS
200.0000 mg | ORAL_CAPSULE | Freq: Two times a day (BID) | ORAL | Status: DC
  Administered 2014-10-11 – 2014-10-13 (×4): 200 mg via ORAL
  Filled 2014-10-11 (×4): qty 2

## 2014-10-11 MED ORDER — FUROSEMIDE 20 MG PO TABS
20.0000 mg | ORAL_TABLET | Freq: Every day | ORAL | Status: DC
  Administered 2014-10-12 – 2014-10-13 (×2): 20 mg via ORAL
  Filled 2014-10-11 (×2): qty 1

## 2014-10-11 NOTE — Care Management Note (Signed)
Case Management Note  Patient Details  Name: JUANA HARALSON MRN: [id number] Date of Birth: [birth year]  Subjective/Objective:                   altered mental status and hypoglycemia. Action/Plan: discharge planning  Expected Discharge Date:                  Expected Discharge Plan:  [place]  In-House Referral:     Discharge planning Services  CM Consult  Post Acute Care Choice:    Choice offered to:     DME Arranged:    DME Agency:     HH Arranged:    [place] Agency:     Status of Service:  Completed, signed off  Medicare Important Message Given:    Date Medicare IM Given:    Medicare IM give by:    Date Additional Medicare IM Given:    Additional Medicare Important Message give by:     If discussed at [place] of Stay Meetings, dates discussed:    Additional Comments: CM notes pt to be discharged back to SNF upon deischarge; CSW to arrange.  No other CM needs were communicated. Dellie Catholic, RN 10/11/2014, 11:51 AM

## 2014-10-11 NOTE — Progress Notes (Signed)
Report given to RN on 4N. Pt ready to be transferred. Patients daughter Deneen Harts notified of unit transfer.

## 2014-10-11 NOTE — Progress Notes (Signed)
[place] TEAM 1 - Stepdown/ICU TEAM Progress Note  [name] WRU:[id number] DOB: Nov 23, 1934 DOA: 10/09/2014 PCP: No primary care provider on file.  Admit HPI / Brief Narrative: 80 y.o. female with Hx of hypertension, hyperlipidemia, GERD, hypothyroidism, diabetes mellitus, COPD, peripheral vascular disease, chronic kidney disease stage III, TIA, carotid artery stenosis, tobacco abuse, and diastolic congestive heart failure who presented with altered mental status and hypoglycemia.  The patient was found unresponsive by staff in SNF w/ a cbg of 32 which improved to 222 after D50.   In ED patient was found to have WBC 7.4, temperature 98.9, bradycardia, urinalysis w/ small leukocytes, negative troponin, lactate 0.34, potassium 5.5 without T-wave peaking, stable renal function. Chest x-ray showed new pulmonary edema. ABG showed pH 7.269, PCO2 58.6, PaO2 85%.   Patient was hospitalized from 5/22-25 because of near syncope, acute on chronic renal injury, bradycardia, and Enterococcus UTI. She was discharged in stable condition with improved renal function. She continued Zyvox for UTI (until 5/30 per d/c summry). She had negative MRI of brain in during that admission.  HPI/Subjective: The patient is alert oriented and conversant.  She is quite pleasant.  She denies chest pain nausea vomiting fevers chills shortness of breath or headache.  She tolerated her breakfast without any difficulty.  Assessment/Plan:  Acute encephalopathy Appears to have been primarily related to hypoglycemia - patient appears to have returned to her baseline mental status - now stable for transfer to medical bed   UTI - recent Enterococcus on culture  Continue current antibiotics for now with possible return to oral antibiotics in a.m. if patient continues to improve  Acute hypercarbic respiratory failure  Due to somnolence related to hypoglycemia - resolved with no respiratory distress at this time  Hyperkalemia    Resolved - potassium stable  DM w/ severe hypoglycemia  Exact cause of the patient's refractory hypoglycemia not clear - perhaps this was simply poor oral intake due to recent UTI in face of ongoing Lantus therapy - CBGs currently at goal with no further hypoglycemia - continue to slowly wean dextrose and follow closely without scheduled insulin  Hypothyroidism TSH not at ideal goal but acceptable for now - continue usual replacement dosing  Stable chronic diastolic congestive heart failure without acute exacerbation Baseline weight appears to be approximately 90 kg - patient currently 89.5 kg with no clinical evidence to suggest significant volume overload  HTN BP better controlled though not yet at ideal goal - follow without change today  CKD stage III Baseline creatinine 1.5-2 - renal function at baseline  COPD Well compensated at present  Polypharmacy The patient's family reports that she is frequently sedated and less responsive when she is not in the hospital - I am attempting to simplify her medication regimen and the family fully agrees with this strategy  Code Status: NO CODE BLUE / DNR  Family Communication: no family present at time of exam Disposition Plan: Stable for transfer to medical bed - wean from dextrose slowly - follow CBGs closely - PT/OT - possible return to SNF Monday/Tuesday  Consultants: none  Procedures: none  Antibiotics: Aztreonam 5/26 >  DVT prophylaxis: SQ heparin   Objective: Blood pressure 147/51, pulse 81, temperature 98.8 F (37.1 C), temperature source Oral, resp. rate 19, height 5\' 3"  (1.6 m), weight 89.5 kg (197 lb 5 oz), SpO2 95 %.  Intake/Output Summary (Last 24 hours) at 10/11/14 0956 Last data filed at 10/11/14 0700  Gross per 24 hour  Intake  1720 ml  Output   1250 ml  Net    470 ml   Exam: General: No acute respiratory distress - alert and conversant Lungs: Clear to auscultation bilaterally without wheezes or crackles   Cardiovascular: Regular rate and rhythm without murmur gallop or rub  Abdomen: Nontender, nondistended, soft, bowel sounds positive, no rebound, no ascites, no appreciable mass Extremities: No significant cyanosis, clubbing, edema bilateral lower extremities  Data Reviewed: Basic Metabolic Panel:  Recent Labs Lab 10/06/14 0330 10/07/14 0346 10/09/14 2015 10/10/14 0717 10/10/14 0727 10/11/14 0251  NA 140 143 142 143  --  139  K 4.5 4.9 5.5* 4.5  --  4.2  CL 106 109 107 105  --  105  CO2 27 24 24 26   --  26  GLUCOSE 146* 190* 135* 117*  --  110*  BUN 44* 35* 31* 30*  --  29*  CREATININE 2.01* 1.79* 1.91* 1.81*  --  1.67*  CALCIUM 8.8* 9.5 9.2 9.4  --  8.9  MG  --   --   --   --  2.3  --     CBC:  Recent Labs Lab 10/05/14 1123 10/06/14 0330 10/09/14 2015 10/11/14 0251  WBC 8.2 7.7 7.4 8.1  NEUTROABS 4.1  --  5.9  --   HGB 10.5* 9.1* 9.4* 9.7*  HCT 33.9* 30.0* 31.5* 31.3*  MCV 92.9 92.6 93.8 92.1  PLT 215 211 234 260    Liver Function Tests:  Recent Labs Lab 10/06/14 0330 10/07/14 1645 10/11/14 0251  AST 13* 17 14*  ALT 12* 12* 10*  ALKPHOS 56 61 58  BILITOT 0.3 0.6 0.5  PROT 6.3* 6.1* 6.0*  ALBUMIN 2.9* 3.0* 2.7*    Recent Labs Lab 10/07/14 1645  LIPASE <10*   Coags:  Recent Labs Lab 10/10/14 0141  INR 1.12   Cardiac Enzymes:  Recent Labs Lab 10/09/14 2015 10/10/14 0141 10/10/14 0727 10/10/14 1230  TROPONINI <0.03 <0.03 <0.03 <0.03    CBG:  Recent Labs Lab 10/10/14 1223 10/10/14 1537 10/10/14 2214 10/11/14 0027 10/11/14 0411  GLUCAP 108* 110* 174* 162* 102*    Recent Results (from the past 240 hour(s))  Urine culture     Status: None   Collection Time: 10/03/14  1:58 PM  Result Value Ref Range Status   Specimen Description URINE, CLEAN CATCH  Final   Special Requests NONE  Final   Colony Count   Final    >=100,000 COLONIES/ML Performed at [facility]    Culture   Final    Multiple bacterial morphotypes  present, none predominant. Suggest appropriate recollection if clinically indicated. Performed at [facility]    Report Status 10/04/2014 FINAL  Final  Urine culture     Status: None   Collection Time: 10/05/14 11:52 AM  Result Value Ref Range Status   Specimen Description URINE, CATHETERIZED  Final   Special Requests Normal  Final   Colony Count   Final    80,000 COLONIES/ML Performed at [facility]    Culture   Final    ENTEROCOCCUS SPECIES Performed at [facility]    Report Status 10/07/2014 FINAL  Final   Organism ID, Bacteria ENTEROCOCCUS SPECIES  Final      Susceptibility   Enterococcus species - MIC*    AMPICILLIN <=2 SENSITIVE Sensitive     LEVOFLOXACIN >=8 RESISTANT Resistant     NITROFURANTOIN <=16 SENSITIVE Sensitive     VANCOMYCIN 1 SENSITIVE Sensitive  TETRACYCLINE >=16 RESISTANT Resistant     * ENTEROCOCCUS SPECIES  MRSA PCR Screening     Status: None   Collection Time: 10/05/14  7:30 PM  Result Value Ref Range Status   MRSA by PCR NEGATIVE NEGATIVE Final    Comment:        The GeneXpert MRSA Assay (FDA approved for NASAL specimens only), is one component of a comprehensive MRSA colonization surveillance program. It is not intended to diagnose MRSA infection nor to guide or monitor treatment for MRSA infections.   Culture, blood (routine x 2)     Status: None (Preliminary result)   Collection Time: 10/10/14  1:41 AM  Result Value Ref Range Status   Specimen Description BLOOD LEFT ANTECUBITAL  Final   Special Requests BOTTLES DRAWN AEROBIC ONLY 5CC  Final   Culture   Final           BLOOD CULTURE RECEIVED NO GROWTH TO DATE CULTURE WILL BE HELD FOR 5 DAYS BEFORE ISSUING A FINAL NEGATIVE REPORT Performed at [facility]    Report Status PENDING  Incomplete  Culture, blood (routine x 2)     Status: None (Preliminary result)   Collection Time: 10/10/14  1:45 AM  Result Value Ref Range Status   Specimen Description  BLOOD LEFT FOREARM  Final   Special Requests BOTTLES DRAWN AEROBIC ONLY 10CC  Final   Culture   Final           BLOOD CULTURE RECEIVED NO GROWTH TO DATE CULTURE WILL BE HELD FOR 5 DAYS BEFORE ISSUING A FINAL NEGATIVE REPORT Note: Culture results may be compromised due to an excessive volume of blood received in culture bottles. Performed at [facility]    Report Status PENDING  Incomplete     Studies:   Recent x-ray studies have been reviewed in detail by the Attending Physician  Scheduled Meds:  Scheduled Meds: . amitriptyline  25 mg Oral QHS  . amLODipine  10 mg Oral QHS  . antiseptic oral rinse  7 mL Mouth Rinse BID  . aspirin EC  81 mg Oral Daily  . aztreonam  1 g Intravenous Q12H  . B-complex with vitamin C  1 tablet Oral Daily  . carvedilol  6.25 mg Oral BID WC  . cloNIDine  0.2 mg Oral TID  . docusate sodium  100 mg Oral BID  . escitalopram  20 mg Oral Daily  . furosemide  40 mg Oral Daily  . gabapentin  300 mg Oral BID  . heparin  5,000 Units Subcutaneous 3 times per day  . hydrocerin  1 application Topical BID  . insulin aspart  0-9 Units Subcutaneous TID WC  . levothyroxine  50 mcg Oral QAC breakfast  . rosuvastatin  20 mg Oral QPM    Time spent on care of this patient: 35 mins   Jaxson Keener T , MD   [facility] Office  [phone] Pager - Text Page per Shea Evans as per below:  On-Call/Text Page:      [email or web address]      password [email or web address]  If 7PM-7AM, please contact night-coverage [email or web address] Password [email or web address] 10/11/2014, 9:56 AM   LOS: 2 days

## 2014-10-11 NOTE — Progress Notes (Signed)
Attempted report to 4N. RN will call back

## 2014-10-11 NOTE — Progress Notes (Signed)
Recd patient accompanied by 2 techs. Patient reported to be on contact isolation for +MRSA. Oriented patient to unit and call system. Bed in low position, call bell in reach and bed alarm on.

## 2014-10-12 DIAGNOSIS — E119 Type 2 diabetes mellitus without complications: Secondary | ICD-10-CM

## 2014-10-12 DIAGNOSIS — G9340 Encephalopathy, unspecified: Secondary | ICD-10-CM

## 2014-10-12 DIAGNOSIS — J449 Chronic obstructive pulmonary disease, unspecified: Secondary | ICD-10-CM

## 2014-10-12 DIAGNOSIS — N183 Chronic kidney disease, stage 3 (moderate): Secondary | ICD-10-CM

## 2014-10-12 DIAGNOSIS — N390 Urinary tract infection, site not specified: Secondary | ICD-10-CM

## 2014-10-12 LAB — FOLATE: FOLATE: 17.5 ng/mL (ref >5.9)

## 2014-10-12 LAB — BASIC METABOLIC PANEL
Anion gap: 8 (ref 5–15)
BUN: 30 mg/dL — ABNORMAL HIGH (ref 6–20)
CO2: 27 mmol/L (ref 22–32)
Calcium: 8.8 mg/dL — ABNORMAL LOW (ref 8.9–10.3)
Chloride: 102 mmol/L (ref 101–111)
Creatinine, Ser: 1.65 mg/dL — ABNORMAL HIGH (ref 0.44–1.00)
GFR calc non Af Amer: 29 mL/min — ABNORMAL LOW (ref >60)
GFR, EST AFRICAN AMERICAN: 33 mL/min — AB (ref >60)
Glucose, Bld: 242 mg/dL — ABNORMAL HIGH (ref 65–99)
POTASSIUM: 4.4 mmol/L (ref 3.5–5.1)
SODIUM: 137 mmol/L (ref 135–145)

## 2014-10-12 LAB — GLUCOSE, CAPILLARY
GLUCOSE-CAPILLARY: 198 mg/dL — AB (ref 65–99)
GLUCOSE-CAPILLARY: 178 mg/dL — AB (ref 65–99)
Glucose-Capillary: 258 mg/dL — ABNORMAL HIGH (ref 65–99)
Glucose-Capillary: 209 mg/dL — ABNORMAL HIGH (ref 65–99)
Glucose-Capillary: 229 mg/dL — ABNORMAL HIGH (ref 65–99)
Glucose-Capillary: 233 mg/dL — ABNORMAL HIGH (ref 65–99)

## 2014-10-12 LAB — VITAMIN B12: Vitamin B-12: 199 pg/mL (ref 180–914)

## 2014-10-12 MED ORDER — NICOTINE 14 MG/24HR TD PT24
14.0000 mg | MEDICATED_PATCH | Freq: Every day | TRANSDERMAL | Status: DC
  Administered 2014-10-12: 14 mg via TRANSDERMAL
  Filled 2014-10-12: qty 1

## 2014-10-12 MED ORDER — INSULIN GLARGINE 100 UNIT/ML ~~LOC~~ SOLN
20.0000 [IU] | Freq: Every day | SUBCUTANEOUS | Status: DC
  Administered 2014-10-12: 20 [IU] via SUBCUTANEOUS
  Filled 2014-10-12 (×2): qty 0.2

## 2014-10-12 NOTE — Progress Notes (Signed)
[facility] PROGRESS NOTE  [name] JKD:[id number] DOB: Oct 28, 1934 DOA: 10/09/2014 PCP: No primary care provider on file.  Assessment/Plan: 80 y.o. female with PMH of HTN, COPD, Hypothyroidism, DM, CKD  stage III, TIA, CHF, recently treated for UTI who presented with confusion, hypoglycemia  -The patient was found unresponsive by staff in SNF w/ a cbg of 32 which improved to 222 after D50. ED patient was found to have WBC 7.4, temperature 98.9, bradycardia, urinalysis w/ small leukocytes, negative troponin, lactate 0.34, potassium 5.5 without T-wave peaking, stable renal function. Chest x-ray showed new pulmonary edema. ABG showed pH 7.269, PCO2 58.6, PaO2 85%.   Patient was hospitalized from 5/22-25 because of near syncope, acute on chronic renal injury, bradycardia, and Enterococcus UTI. She was discharged in stable condition with improved renal function. She continued Zyvox for UTI (until 5/30 per d/c summry). She had negative MRI of brain in during that admission.   1. Acute encephalopathy likely due to hypoglycemia. encephalopathy ->resolved after d 50%.  2. UTI. Recent Enterococcus on culture .  -Continue current antibiotics for now. Likely to d/c in a.m. if patient continues to improve 3. Acute hypercarbic respiratory failure thought due to somnolence related to hypoglycemia. Resolved with no respiratory distress -COPD. Well compensated. counseled to stop smoking. Cont prn bronchodilators  4. DM w/ severe hypoglycemia likely due to insulin on top of CKD. ?poor oral intake due to recent UTI -hypoglycemia resolved. Currently hyperglycemia. HA1c-8.1. Home lantus is 40 U. Will resume at 20U+ISS.  adjust as needed 5. CHF. Chronic diastolic congestive heart failure without acute exacerbation. Resume lasix. Monitor  6. HTN. BP control is improving. Resumed home regimen. BB, amlodipine, clonidine.    Polypharmacy The patient's family reported that she is frequently sedated and  less responsive when she is not in the hospital. Cont to try to simplify her medication regimen and the family fully agreed. May need to d/c amitriptyline in elderly patient   Code Status: DNR Family Communication: d/w patient (indicate person spoken with, relationship, and if by phone, the number) Disposition Plan: SNF in 24-48 hrs    Consultants:  none  Procedures:  none  Antibiotics:  Aztreonam 5/26 >   (indicate start date, and stop date if known)  HPI/Subjective: Alert, oriented   Objective: Filed Vitals:   10/12/14 0919  BP: 152/45  Pulse: 59  Temp: 98.4 F (36.9 C)  Resp: 18    Intake/Output Summary (Last 24 hours) at 10/12/14 1312 Last data filed at 10/12/14 0920  Gross per 24 hour  Intake    450 ml  Output    150 ml  Net    300 ml   Filed Weights   10/10/14 0427 10/11/14 0412 10/12/14 0744  Weight: 88 kg (194 lb 0.1 oz) 89.5 kg (197 lb 5 oz) 87.9 kg (193 lb 12.6 oz)    Exam:   General:  Alert, no distress   Cardiovascular: s1,s2 rrr  Respiratory: CTA BL  Abdomen: soft, nt,nd   Musculoskeletal: no leg edema   Data Reviewed: Basic Metabolic Panel:  Recent Labs Lab 10/07/14 0346 10/09/14 2015 10/10/14 0717 10/10/14 0727 10/11/14 0251 10/12/14 0550  NA 143 142 143  --  139 137  K 4.9 5.5* 4.5  --  4.2 4.4  CL 109 107 105  --  105 102  CO2 24 24 26   --  26 27  GLUCOSE 190* 135* 117*  --  110* 242*  BUN 35* 31* 30*  --  29* 30*  CREATININE 1.79* 1.91* 1.81*  --  1.67* 1.65*  CALCIUM 9.5 9.2 9.4  --  8.9 8.8*  MG  --   --   --  2.3  --   --    Liver Function Tests:  Recent Labs Lab 10/06/14 0330 10/07/14 1645 10/11/14 0251  AST 13* 17 14*  ALT 12* 12* 10*  ALKPHOS 56 61 58  BILITOT 0.3 0.6 0.5  PROT 6.3* 6.1* 6.0*  ALBUMIN 2.9* 3.0* 2.7*    Recent Labs Lab 10/07/14 1645  LIPASE <10*   No results for input(s): AMMONIA in the last 168 hours. CBC:  Recent Labs Lab 10/06/14 0330 10/09/14 2015 10/11/14 0251  WBC  7.7 7.4 8.1  NEUTROABS  --  5.9  --   HGB 9.1* 9.4* 9.7*  HCT 30.0* 31.5* 31.3*  MCV 92.6 93.8 92.1  PLT 211 234 260   Cardiac Enzymes:  Recent Labs Lab 10/09/14 2015 10/10/14 0141 10/10/14 0727 10/10/14 1230  TROPONINI <0.03 <0.03 <0.03 <0.03   BNP (last 3 results)  Recent Labs  08/10/14 0608 10/03/14 1217 10/10/14 0314  BNP 170.7* 323.1* 479.9*    ProBNP (last 3 results) No results for input(s): PROBNP in the last 8760 hours.  CBG:  Recent Labs Lab 10/11/14 2225 10/12/14 0431 10/12/14 0635 10/12/14 0759 10/12/14 1209  GLUCAP 236* 258* 198* 209* 178*    Recent Results (from the past 240 hour(s))  Urine culture     Status: None   Collection Time: 10/03/14  1:58 PM  Result Value Ref Range Status   Specimen Description URINE, CLEAN CATCH  Final   Special Requests NONE  Final   Colony Count   Final    >=100,000 COLONIES/ML Performed at [facility]    Culture   Final    Multiple bacterial morphotypes present, none predominant. Suggest appropriate recollection if clinically indicated. Performed at [facility]    Report Status 10/04/2014 FINAL  Final  Urine culture     Status: None   Collection Time: 10/05/14 11:52 AM  Result Value Ref Range Status   Specimen Description URINE, CATHETERIZED  Final   Special Requests Normal  Final   Colony Count   Final    80,000 COLONIES/ML Performed at [facility]    Culture   Final    ENTEROCOCCUS SPECIES Performed at [facility]    Report Status 10/07/2014 FINAL  Final   Organism ID, Bacteria ENTEROCOCCUS SPECIES  Final      Susceptibility   Enterococcus species - MIC*    AMPICILLIN <=2 SENSITIVE Sensitive     LEVOFLOXACIN >=8 RESISTANT Resistant     NITROFURANTOIN <=16 SENSITIVE Sensitive     VANCOMYCIN 1 SENSITIVE Sensitive     TETRACYCLINE >=16 RESISTANT Resistant     * ENTEROCOCCUS SPECIES  MRSA PCR Screening     Status: None   Collection Time: 10/05/14  7:30 PM   Result Value Ref Range Status   MRSA by PCR NEGATIVE NEGATIVE Final    Comment:        The GeneXpert MRSA Assay (FDA approved for NASAL specimens only), is one component of a comprehensive MRSA colonization surveillance program. It is not intended to diagnose MRSA infection nor to guide or monitor treatment for MRSA infections.   Culture, blood (routine x 2)     Status: None (Preliminary result)   Collection Time: 10/10/14  1:41 AM  Result Value Ref Range Status   Specimen Description  BLOOD LEFT ANTECUBITAL  Final   Special Requests BOTTLES DRAWN AEROBIC ONLY 5CC  Final   Culture   Final           BLOOD CULTURE RECEIVED NO GROWTH TO DATE CULTURE WILL BE HELD FOR 5 DAYS BEFORE ISSUING A FINAL NEGATIVE REPORT Performed at [facility]    Report Status PENDING  Incomplete  Culture, blood (routine x 2)     Status: None (Preliminary result)   Collection Time: 10/10/14  1:45 AM  Result Value Ref Range Status   Specimen Description BLOOD LEFT FOREARM  Final   Special Requests BOTTLES DRAWN AEROBIC ONLY 10CC  Final   Culture   Final           BLOOD CULTURE RECEIVED NO GROWTH TO DATE CULTURE WILL BE HELD FOR 5 DAYS BEFORE ISSUING A FINAL NEGATIVE REPORT Note: Culture results may be compromised due to an excessive volume of blood received in culture bottles. Performed at [facility]    Report Status PENDING  Incomplete  Urine culture     Status: None   Collection Time: 10/10/14  5:53 AM  Result Value Ref Range Status   Specimen Description URINE, CATHETERIZED  Final   Special Requests NONE  Final   Colony Count NO GROWTH Performed at [facility]   Final   Culture NO GROWTH Performed at [facility]   Final   Report Status 10/11/2014 FINAL  Final     Studies: No results found.  Scheduled Meds: . amitriptyline  25 mg Oral QHS  . amLODipine  10 mg Oral QHS  . antiseptic oral rinse  7 mL Mouth Rinse BID  . aspirin EC  81 mg Oral Daily  .  aztreonam  1 g Intravenous Q12H  . B-complex with vitamin C  1 tablet Oral Daily  . carvedilol  6.25 mg Oral BID WC  . cloNIDine  0.2 mg Oral TID  . docusate sodium  100 mg Oral BID  . escitalopram  20 mg Oral Daily  . furosemide  20 mg Oral Daily  . gabapentin  200 mg Oral BID  . heparin  5,000 Units Subcutaneous 3 times per day  . hydrocerin  1 application Topical BID  . insulin aspart  0-9 Units Subcutaneous TID WC  . levothyroxine  50 mcg Oral QAC breakfast  . rosuvastatin  20 mg Oral QPM   Continuous Infusions: . dextrose 5 % and 0.45% NaCl 20 mL/hr at 10/11/14 1330    Principal Problem:   Acute encephalopathy Active Problems:   Hypothyroidism   Diabetes mellitus without complication   HYPERCHOLESTEROLEMIA   Essential hypertension   CAD (coronary artery disease)   COPD (chronic obstructive pulmonary disease)   Hyperkalemia   CKD (chronic kidney disease) stage 3, GFR 30-59 ml/min   Hypoglycemia   TIA (transient ischemic attack)   UTI (urinary tract infection)   Bradycardia   CHF exacerbation    Time spent: >35 minutes     Kinnie Feil  [facility] Pager [phone]. If 7PM-7AM, please contact night-coverage at [email or web address], password [facility] 10/12/2014, 1:12 PM  LOS: 3 days

## 2014-10-13 LAB — GLUCOSE, CAPILLARY
GLUCOSE-CAPILLARY: 256 mg/dL — AB (ref 65–99)
Glucose-Capillary: 220 mg/dL — ABNORMAL HIGH (ref 65–99)
Glucose-Capillary: 182 mg/dL — ABNORMAL HIGH (ref 65–99)
Glucose-Capillary: 256 mg/dL — ABNORMAL HIGH (ref 65–99)
Glucose-Capillary: 244 mg/dL — ABNORMAL HIGH (ref 65–99)

## 2014-10-13 MED ORDER — AMITRIPTYLINE HCL 50 MG PO TABS: 25.0000 mg | ORAL_TABLET | Freq: Every day | ORAL | Status: DC

## 2014-10-13 MED ORDER — GABAPENTIN 100 MG PO CAPS: 200.0000 mg | ORAL_CAPSULE | Freq: Two times a day (BID) | ORAL | Status: DC

## 2014-10-13 MED ORDER — HYDROCODONE-ACETAMINOPHEN 5-325 MG PO TABS: 1.0000 | ORAL_TABLET | Freq: Four times a day (QID) | ORAL | Status: DC | PRN

## 2014-10-13 MED ORDER — INSULIN GLARGINE 100 UNIT/ML ~~LOC~~ SOLN: 20.0000 [IU] | Freq: Every day | SUBCUTANEOUS | Status: DC

## 2014-10-13 MED ORDER — INSULIN ASPART 100 UNIT/ML ~~LOC~~ SOLN: 0.0000 [IU] | Freq: Three times a day (TID) | SUBCUTANEOUS | Status: AC

## 2014-10-13 MED ORDER — CARVEDILOL 6.25 MG PO TABS: 6.2500 mg | ORAL_TABLET | Freq: Two times a day (BID) | ORAL | Status: DC

## 2014-10-13 MED ORDER — POLYETHYLENE GLYCOL 3350 17 G PO PACK: 17.0000 g | PACK | Freq: Every day | ORAL | Status: AC | PRN

## 2014-10-13 MED ORDER — FUROSEMIDE 40 MG PO TABS: 20.0000 mg | ORAL_TABLET | Freq: Every day | ORAL | Status: DC

## 2014-10-13 MED ORDER — LINEZOLID 600 MG PO TABS: 600.0000 mg | ORAL_TABLET | Freq: Two times a day (BID) | ORAL | Status: DC

## 2014-10-13 NOTE — Clinical Social Work Note (Signed)
Clinical Social Work Assessment  Patient Details  Name: [name] MRN: [id number] Date of Birth: 06-Sep-1934  Date of referral:  10/13/14               Reason for consult:  Facility Placement              Housing/Transportation Living arrangements for the past 2 months:  [place] of Information:  Adult Children Patient Interpreter Needed:  None Criminal Activity/Legal Involvement Pertinent to Current Situation/Hospitalization:  No - Comment as needed Significant Relationships:  Adult Children Lives with:  Facility Resident Do you feel safe going back to the place where you live?  No Need for family participation in patient care:  Yes (Comment)  Care giving concerns:  N/A    Social Worker assessment / plan:  CSW met the pt at the bedside. The pt asked CSW to call her children. CSW spoke with both [name] and [name]. CSW explained the pt will be discharge today.  John and [name] reported that they would like the pt to go to [place]. CSW explained to [name] and [name] that [facility] did not offer a bed. CSW explained that [facility] could take the pt with a LOG. CSW asked [name] to call Abigail Butts at [facility] so they can complete the admission paperwork.    Employment status:  Retired Forensic scientist:  Managed Care PT Recommendations:  [place] / Referral to community resources:  [place]  Patient/Family's Response to care:  [name] and [name] reported the care in which the pt received has been great.   Patient/Family's Understanding of and Emotional Response to Diagnosis, Current Treatment, and Prognosis:  [name] acknowledged a decline in the pt's health. [name] reported that she feelings the pt will not return back to ALF.   Emotional Assessment Appearance:   Unable to Assess  Attitude/Demeanor/Rapport:  Unable to Assess Affect (typically observed):  Unable to Assess Orientation:  Oriented to Self, Time, Sitution  and Place  Alcohol / Substance use:  Not Applicable Psych involvement (Current and /or in the community):  No (Comment)  Discharge Needs  Concerns to be addressed:  Denies Needs/Concerns at this time Readmission within the last 30 days:  Yes Current discharge risk:  None Barriers to Discharge:  No Barriers Identified   [place], MSW, [place]

## 2014-10-13 NOTE — Discharge Summary (Signed)
Physician Discharge Summary   Patient ID: [name] MRN: [id number] DOB/AGE: 78-13-1936 78 y.o.  Admit date: 10/09/2014 Discharge date: 10/13/2014  Primary Care Physician:  Mayra Neer, MD  Discharge Diagnoses:    . Acute encephalopathy due to hypoglycemia  . Hypoglycemia . recent enterococcus UTI (urinary tract infection) . TIA (transient ischemic attack) . Hypothyroidism . Hyperkalemia resolved  . HYPERCHOLESTEROLEMIA . Essential hypertension . COPD (chronic obstructive pulmonary disease) . CKD (chronic kidney disease) stage 3, GFR 30-59 ml/min . CAD (coronary artery disease) . Bradycardia   Consults: none    Recommendations for Outpatient Follow-up:  Please note medication changes  Insulin has been decreased. Lantus 20 units daily and sensitive sliding scale as per instructions  Amitriptyline decreased to 0.25 mg daily, continue for this week and then wean off  Neurontin has been decreased to 200 mg BID   Due to bradycardia, Coreg has been decreased to 6.25 mg twice a day  Vicodin has also been decreased, 1 tablet every 6 hours as needed for severe pain.   Lasix decreased to 20 mg daily, may need to give only as needed if poor oral intake  Zyvox 1 more day    TESTS THAT NEED FOLLOW-UP CBC, BMET in 5 days   DIET: Carb modified diet    Allergies:   Allergies  Allergen Reactions  . Ace Inhibitors Other (See Comments)    Listed on MAR  . Codeine Other (See Comments)    Listed on MAR  . Lisinopril Other (See Comments)    unknown  . Penicillins Hives  . Trimethoprim   . Septra [Sulfamethoxazole-Trimethoprim] Hives and Rash  . Sulfur Hives and Rash     Discharge Medications:   Medication List    STOP taking these medications        insulin lispro 100 UNIT/ML injection  Commonly known as:  HUMALOG      TAKE these medications        acetaminophen 500 MG tablet  Commonly known as:  TYLENOL  Take 500 mg by mouth 3 (three) times  daily. 8am 12pm, 4pm     albuterol (2.5 MG/3ML) 0.083% nebulizer solution  Commonly known as:  PROVENTIL  Take 2.5 mg by nebulization every 6 (six) hours as needed for wheezing.     ALPRAZolam 0.25 MG tablet  Commonly known as:  XANAX  Take 1 tablet (0.25 mg total) by mouth 2 (two) times daily as needed for anxiety.     alum & mag hydroxide-simeth 200-200-20 MG/5ML suspension  Commonly known as:  MAALOX/MYLANTA  Take 30 mLs by mouth every 6 (six) hours as needed for indigestion or heartburn. Do not exceed 4 doses in 24 hours     amitriptyline 50 MG tablet  Commonly known as:  ELAVIL  Take 0.5 tablets (25 mg total) by mouth at bedtime.     amLODipine 10 MG tablet  Commonly known as:  NORVASC  Take 10 mg by mouth at bedtime.     aspirin 81 MG chewable tablet  Chew 81 mg by mouth daily.     B-complex with vitamin C tablet  Take 1 tablet by mouth daily.     bisacodyl 10 MG suppository  Commonly known as:  DULCOLAX  Place 10 mg rectally as needed for moderate constipation.     carvedilol 6.25 MG tablet  Commonly known as:  COREG  Take 1 tablet (6.25 mg total) by mouth 2 (two) times daily with a meal.     cloNIDine  0.2 MG tablet  Commonly known as:  CATAPRES  Take 0.2 mg by mouth 2 (two) times daily. Hold if blood pressure less than 100/60     Cranberry 500 MG Caps  Take 1,000 mg by mouth 2 (two) times daily.     docusate sodium 100 MG capsule  Commonly known as:  COLACE  Take 1 capsule (100 mg total) by mouth every 12 (twelve) hours.     escitalopram 20 MG tablet  Commonly known as:  LEXAPRO  Take 20 mg by mouth daily.     famotidine 20 MG tablet  Commonly known as:  PEPCID  Take 1 tablet (20 mg total) by mouth 2 (two) times daily as needed for heartburn or indigestion.     furosemide 40 MG tablet  Commonly known as:  LASIX  Take 0.5 tablets (20 mg total) by mouth daily.     gabapentin 100 MG capsule  Commonly known as:  NEURONTIN  Take 2 capsules (200 mg  total) by mouth 2 (two) times daily.     gi cocktail Susp suspension  Take 30 mLs by mouth 3 (three) times daily as needed for indigestion. Shake well.     guaiFENesin 100 MG/5ML liquid  Commonly known as:  ROBITUSSIN  Take 100 mg by mouth every 6 (six) hours as needed for cough. Not to exceed 4 doses in 24 hours     HYDROcodone-acetaminophen 5-325 MG per tablet  Commonly known as:  NORCO/VICODIN  Take 1 tablet by mouth every 6 (six) hours as needed for moderate pain or severe pain.     insulin aspart 100 UNIT/ML injection  Commonly known as:  novoLOG  Inject 0-9 Units into the skin 3 (three) times daily with meals. Sliding scale CBG 70 - 120: 0 units CBG 121 - 150: 1 unit,  CBG 151 - 200: 2 units,  CBG 201 - 250: 3 units,  CBG 251 - 300: 5 units,  CBG 301 - 350: 7 units,  CBG 351 - 400: 9 units   CBG > 400: 9 units and notify your MD     insulin glargine 100 UNIT/ML injection  Commonly known as:  LANTUS  Inject 0.2 mLs (20 Units total) into the skin daily.     levothyroxine 50 MCG tablet  Commonly known as:  SYNTHROID, LEVOTHROID  Take 50 mcg by mouth daily before breakfast.     linezolid 600 MG tablet  Commonly known as:  ZYVOX  Take 1 tablet (600 mg total) by mouth 2 (two) times daily. X 1 more day     loperamide 2 MG capsule  Commonly known as:  IMODIUM  Take 2 mg by mouth as needed for diarrhea or loose stools. Do not exceed 8 doses in 24 hours     MINERIN Crea  Apply 1 application topically 2 (two) times daily. Apply to both lower extremities and feet twice daily     ondansetron 4 MG disintegrating tablet  Commonly known as:  ZOFRAN ODT  Take 1 tablet (4 mg total) by mouth every 8 (eight) hours as needed for nausea or vomiting.     pantoprazole 40 MG tablet  Commonly known as:  PROTONIX  Take 1 tablet (40 mg total) by mouth daily.     polyethylene glycol packet  Commonly known as:  MIRALAX / GLYCOLAX  Take 17 g by mouth daily as needed for mild constipation,  moderate constipation or severe constipation. Mix in 8 oz liquid and drink  rosuvastatin 20 MG tablet  Commonly known as:  CRESTOR  Take 20 mg by mouth every evening. 4pm     Vitamin D (Ergocalciferol) 50000 UNITS Caps capsule  Commonly known as:  DRISDOL  Take 50,000 Units by mouth every 7 (seven) days. On Saturdays         Brief H and P: For complete details please refer to admission H and P, but in brief [name] is a 80 y.o. female with hypertension, hyperlipidemia, GERD, hypothyroidism, diabetes mellitus, COPD, peripheral vascular disease, chronic kidney disease-stage III, history of TIA, carotid artery stenosis, history of tobacco abuse, diastolic congestive heart failure, who presented with altered mental status and hypoglycemia. Patient was recently hospitalized from 5/22 to 5/25 because of near syncope, acute on chronic renal injury, bradycardia and UTI (positive for enterococcus, sensitive to ampicillin). She was discharged at stable condition with improved renal function. She continued to Zyvox for UTI (until 5/30 per d/c summry). She had negative MRI of brain in previous admission. It seems that patient was found to be unresponsive by staff in SNF on the day of admission. She was found to have cbg was 32 which improved to 222 after given d50. Per EDP, Dr. Jeneen Rinks, her mental status improved after given 2 doses of Narcan in the emergency room. In ED, patient was found to have WBC 7.4, temperature 98.9, bradycardia, urinalysis showed small amount of leukocyte, negative troponin, lactate 0.34, potassium 5.5 without T-wave peaking, stable renal function. Chest x-ray showed new pulmonary edema. ABG showed pH 7.269, PCO2 58.6, PaO2 85%. She is admitted to inpatient for further evaluation and treatment.  Hospital Course:  Patient was found unresponsive by staff in the skilled nursing facility with the CBG of 32 which improved to 222 after D50. ED patient was found to have WBC 7.4,  temperature 98.9, bradycardia, urinalysis w/ small leukocytes, negative troponin, lactate 0.34, potassium 5.5 without T-wave peaking, stable renal function. Chest x-ray showed new pulmonary edema. ABG showed pH 7.269, PCO2 58.6, PaO2 85%.  Patient was hospitalized from 5/22-25 because of near syncope, acute on chronic renal injury, bradycardia, and Enterococcus UTI. She was discharged in stable condition with improved renal function. She continued Zyvox for UTI (until 5/30 per d/c summry). She had negative MRI of brain in during that admission.  Acute encephalopathy likely due to severe hypoglycemia Please note that CBG was 32 at the time of admission. She was given D50 and CBG improved to 222. Also patient received Narcan in ED which improved her mental status. Patient has been eating well, her insulin regimen has been adjusted and she had no further episodes of hypoglycemia.  Recent enterococcus UTI Patient was placed on IV aztreonam while during this admission, she will need 1 more day of oral Zyvox as per prior instructions to complete the course still 5/30 if she is discharged today to skilled nursing facility.  Acute hypercarbic respiratory failure likely due to narcotics and somnolence due to hypoglycemia, underlying COPD Resolved, patient was counseled to stop smoking, continue broncho-dilators as needed Also Vicodin was decreased to 1 tablet every 6 hours as needed for moderate or severe pain. Do not give 2 tablets.     diabetes mellitus with severe hypoglycemia, has underlying CKD, poor oral intake, recent UTI  - Hypoglycemia has resolved, hemoglobin A1c 8.1, Lantus was resumed at 20 units  (was taking 40 units prior to admission ) and placed on sliding scale insulin sensitive as per instructions above.   Chronic diastolic  CHF Currently compensated, resume Lasix however lower dose at 20 mg daily, if poor oral intake, use only as needed   Hypertension BP currently stable, continue beta  blocker, amlodipine, clonidine. However due to bradycardia, Coreg decreased to 6.25mg  BID   Day of Discharge BP 168/45 mmHg  Pulse 60  Temp(Src) 98.5 F (36.9 C) (Oral)  Resp 16  Ht 5\' 3"  (1.6 m)  Wt 86.4 kg (190 lb 7.6 oz)  BMI 33.75 kg/m2  SpO2 99%  Physical Exam: General: Alert and awake oriented x3 not in any acute distress. HEENT: anicteric sclera, pupils reactive to light and accommodation CVS: S1-S2 clear no murmur rubs or gallops Chest: clear to auscultation bilaterally, no wheezing rales or rhonchi Abdomen: soft nontender, nondistended, normal bowel sounds Extremities: no cyanosis, clubbing or edema noted bilaterally Neuro: Cranial nerves II-XII intact, no focal neurological deficits   The results of significant diagnostics from this hospitalization (including imaging, microbiology, ancillary and laboratory) are listed below for reference.    LAB RESULTS: Basic Metabolic Panel:  Recent Labs Lab 10/10/14 0727 10/11/14 0251 10/12/14 0550  NA  --  139 137  K  --  4.2 4.4  CL  --  105 102  CO2  --  26 27  GLUCOSE  --  110* 242*  BUN  --  29* 30*  CREATININE  --  1.67* 1.65*  CALCIUM  --  8.9 8.8*  MG 2.3  --   --    Liver Function Tests:  Recent Labs Lab 10/07/14 1645 10/11/14 0251  AST 17 14*  ALT 12* 10*  ALKPHOS 61 58  BILITOT 0.6 0.5  PROT 6.1* 6.0*  ALBUMIN 3.0* 2.7*    Recent Labs Lab 10/07/14 1645  LIPASE <10*   No results for input(s): AMMONIA in the last 168 hours. CBC:  Recent Labs Lab 10/09/14 2015 10/11/14 0251  WBC 7.4 8.1  NEUTROABS 5.9  --   HGB 9.4* 9.7*  HCT 31.5* 31.3*  MCV 93.8 92.1  PLT 234 260   Cardiac Enzymes:  Recent Labs Lab 10/10/14 0727 10/10/14 1230  TROPONINI <0.03 <0.03   BNP: Invalid input(s): POCBNP CBG:  Recent Labs Lab 10/13/14 0354 10/13/14 0638  GLUCAP 220* 182*    Significant Diagnostic Studies:  Dg Chest Port 1 View  10/09/2014   CLINICAL DATA:  Shortness of breath  EXAM:  PORTABLE CHEST - 1 VIEW  COMPARISON:  10/03/2014  FINDINGS: Diffuse interstitial opacity. Despite a hazy density at the left base the lungs are fairly symmetric. No definitive pleural effusion. Normal heart size and stable mediastinal contours. Ventral and dorsal cervical spine fixation. No acute osseous findings.  IMPRESSION: Pulmonary edema, new from 10/03/2014.   Electronically Signed   By: Monte Fantasia M.D.   On: 10/09/2014 20:54    2D ECHO:   Disposition and Follow-up: Discharge Instructions    Diet Carb Modified    Complete by:  As directed      Discharge instructions    Complete by:  As directed   It is VERY IMPORTANT that you follow up with a PCP on a regular basis.  Check your blood glucoses before each meal and at bedtime and maintain a log of your readings.  Bring this log with you when you follow up with your PCP so that he or she can adjust your insulin at your follow up visit.     Increase activity slowly    Complete by:  As directed  DISPOSITION: Skilled nursing facility   DISCHARGE FOLLOW-UP Follow-up Information    Follow up with SHAW,KIMBERLEE, MD. Schedule an appointment as soon as possible for a visit in 10 days.   Specialty:  Family Medicine   Why:  for hospital follow-up   Contact information:   [address] [place] [address] [phone]        Time spent on Discharge: 35 mins   Signed:   Shey Yott M.D. [facility] 10/13/2014, 12:23 PM Pager: [phone]

## 2014-10-13 NOTE — Clinical Social Work Note (Signed)
CSW attempted to call the pt's children Jenny Reichmann and Butch Penny. CSW left a voice message for both regarding discharge plan. No family at the bedside. CSW will continue to contact the family.   [name], MSW, [place]

## 2014-10-13 NOTE — Progress Notes (Signed)
Full report called to [name] at facility. All questions answered. Brief report given to ambulance transport persons. All questions answered. Patient taken on stretcher aware and agreeable with disposition.

## 2014-10-13 NOTE — Clinical Social Work Placement (Signed)
   CLINICAL SOCIAL WORK PLACEMENT  NOTE  Date:  10/13/2014  Patient Details  Name: [name] MRN: [id number] Date of Birth: [birth year]  Clinical Social Work is seeking post-discharge placement for this patient at the [place] level of care (*CSW will initial, date and re-position this form in  chart as items are completed):  Yes   Patient/family provided with [facility] Work Department's list of facilities offering this level of care within the geographic area requested by the patient (or if unable, by the patient's family).  Yes   Patient/family informed of their freedom to choose among providers that offer the needed level of care, that participate in Medicare, Medicaid or managed care program needed by the patient, have an available bed and are willing to accept the patient.  Yes   Patient/family informed of [facility]'s ownership interest in [facility] and [facility], as well as of the fact that they are under no obligation to receive care at these facilities.  PASRR submitted to EDS on       PASRR number received on       Existing PASRR number confirmed on 10/13/14     FL2 transmitted to all facilities in geographic area requested by pt/family on       FL2 transmitted to all facilities within larger geographic area on 10/13/14     Patient informed that his/her managed care company has contracts with or will negotiate with certain facilities, including the following:        Yes   Patient/family informed of bed offers received.  Patient chooses bed at [facility]     Physician recommends and patient chooses bed at      Patient to be transferred to [facility] on 10/13/14.  Patient to be transferred to facility by [facility]      Patient family notified on 10/13/14 of transfer.  Name of family member notified:  Pt's daughter Theophilus Bones      PHYSICIAN       Additional Comment:     _______________________________________________ Greta Doom, LCSW 10/13/2014, 2:23 PM

## 2014-10-16 LAB — CULTURE, BLOOD (ROUTINE X 2)
Culture: NO GROWTH
Culture: NO GROWTH

## 2014-12-29 ENCOUNTER — Ambulatory Visit (INDEPENDENT_AMBULATORY_CARE_PROVIDER_SITE_OTHER): Payer: Medicare Other | Admitting: Ophthalmology

## 2015-01-26 ENCOUNTER — Encounter (INDEPENDENT_AMBULATORY_CARE_PROVIDER_SITE_OTHER): Payer: Medicare HMO | Admitting: Ophthalmology

## 2015-01-26 DIAGNOSIS — H3531 Nonexudative age-related macular degeneration: Secondary | ICD-10-CM

## 2015-01-26 DIAGNOSIS — I10 Essential (primary) hypertension: Secondary | ICD-10-CM

## 2015-01-26 DIAGNOSIS — H35033 Hypertensive retinopathy, bilateral: Secondary | ICD-10-CM | POA: Diagnosis not present

## 2015-01-26 DIAGNOSIS — E11339 Type 2 diabetes mellitus with moderate nonproliferative diabetic retinopathy without macular edema: Secondary | ICD-10-CM

## 2015-01-26 DIAGNOSIS — E11319 Type 2 diabetes mellitus with unspecified diabetic retinopathy without macular edema: Secondary | ICD-10-CM

## 2015-01-26 DIAGNOSIS — H43813 Vitreous degeneration, bilateral: Secondary | ICD-10-CM

## 2015-01-26 DIAGNOSIS — E11359 Type 2 diabetes mellitus with proliferative diabetic retinopathy without macular edema: Secondary | ICD-10-CM | POA: Diagnosis not present

## 2015-05-31 ENCOUNTER — Inpatient Hospital Stay (HOSPITAL_COMMUNITY)
Admission: EM | Admit: 2015-05-31 | Discharge: 2015-06-07 | DRG: 689 | Disposition: A | Discharge status: Skilled Nursing Facility | Payer: Medicare Other | Attending: Internal Medicine | Admitting: Internal Medicine

## 2015-05-31 ENCOUNTER — Emergency Department (HOSPITAL_COMMUNITY): Payer: Medicare Other

## 2015-05-31 ENCOUNTER — Encounter (HOSPITAL_COMMUNITY): Payer: Self-pay | Admitting: *Deleted

## 2015-05-31 DIAGNOSIS — I5032 Chronic diastolic (congestive) heart failure: Secondary | ICD-10-CM | POA: Diagnosis present

## 2015-05-31 DIAGNOSIS — K7200 Acute and subacute hepatic failure without coma: Secondary | ICD-10-CM | POA: Diagnosis present

## 2015-05-31 DIAGNOSIS — F17210 Nicotine dependence, cigarettes, uncomplicated: Secondary | ICD-10-CM | POA: Diagnosis present

## 2015-05-31 DIAGNOSIS — H3530 Unspecified macular degeneration: Secondary | ICD-10-CM | POA: Diagnosis present

## 2015-05-31 DIAGNOSIS — Z961 Presence of intraocular lens: Secondary | ICD-10-CM | POA: Diagnosis present

## 2015-05-31 DIAGNOSIS — E119 Type 2 diabetes mellitus without complications: Secondary | ICD-10-CM | POA: Diagnosis not present

## 2015-05-31 DIAGNOSIS — Z794 Long term (current) use of insulin: Secondary | ICD-10-CM

## 2015-05-31 DIAGNOSIS — Z66 Do not resuscitate: Secondary | ICD-10-CM | POA: Diagnosis present

## 2015-05-31 DIAGNOSIS — A419 Sepsis, unspecified organism: Secondary | ICD-10-CM

## 2015-05-31 DIAGNOSIS — I959 Hypotension, unspecified: Secondary | ICD-10-CM | POA: Diagnosis present

## 2015-05-31 DIAGNOSIS — I129 Hypertensive chronic kidney disease with stage 1 through stage 4 chronic kidney disease, or unspecified chronic kidney disease: Secondary | ICD-10-CM | POA: Diagnosis present

## 2015-05-31 DIAGNOSIS — N189 Chronic kidney disease, unspecified: Secondary | ICD-10-CM

## 2015-05-31 DIAGNOSIS — N179 Acute kidney failure, unspecified: Secondary | ICD-10-CM | POA: Diagnosis present

## 2015-05-31 DIAGNOSIS — I130 Hypertensive heart and chronic kidney disease with heart failure and stage 1 through stage 4 chronic kidney disease, or unspecified chronic kidney disease: Secondary | ICD-10-CM | POA: Diagnosis present

## 2015-05-31 DIAGNOSIS — N390 Urinary tract infection, site not specified: Principal | ICD-10-CM | POA: Diagnosis present

## 2015-05-31 DIAGNOSIS — Z9841 Cataract extraction status, right eye: Secondary | ICD-10-CM

## 2015-05-31 DIAGNOSIS — I10 Essential (primary) hypertension: Secondary | ICD-10-CM

## 2015-05-31 DIAGNOSIS — J449 Chronic obstructive pulmonary disease, unspecified: Secondary | ICD-10-CM | POA: Diagnosis present

## 2015-05-31 DIAGNOSIS — I519 Heart disease, unspecified: Secondary | ICD-10-CM | POA: Diagnosis not present

## 2015-05-31 DIAGNOSIS — E1122 Type 2 diabetes mellitus with diabetic chronic kidney disease: Secondary | ICD-10-CM | POA: Diagnosis present

## 2015-05-31 DIAGNOSIS — E1151 Type 2 diabetes mellitus with diabetic peripheral angiopathy without gangrene: Secondary | ICD-10-CM | POA: Diagnosis present

## 2015-05-31 DIAGNOSIS — M62838 Other muscle spasm: Secondary | ICD-10-CM | POA: Diagnosis present

## 2015-05-31 DIAGNOSIS — N184 Chronic kidney disease, stage 4 (severe): Secondary | ICD-10-CM | POA: Diagnosis present

## 2015-05-31 DIAGNOSIS — E1142 Type 2 diabetes mellitus with diabetic polyneuropathy: Secondary | ICD-10-CM | POA: Diagnosis present

## 2015-05-31 DIAGNOSIS — I5189 Other ill-defined heart diseases: Secondary | ICD-10-CM | POA: Diagnosis present

## 2015-05-31 DIAGNOSIS — D649 Anemia, unspecified: Secondary | ICD-10-CM | POA: Diagnosis present

## 2015-05-31 DIAGNOSIS — D638 Anemia in other chronic diseases classified elsewhere: Secondary | ICD-10-CM | POA: Diagnosis present

## 2015-05-31 DIAGNOSIS — N183 Chronic kidney disease, stage 3 (moderate): Secondary | ICD-10-CM | POA: Diagnosis present

## 2015-05-31 DIAGNOSIS — Z9842 Cataract extraction status, left eye: Secondary | ICD-10-CM | POA: Diagnosis not present

## 2015-05-31 DIAGNOSIS — R531 Weakness: Secondary | ICD-10-CM | POA: Diagnosis present

## 2015-05-31 DIAGNOSIS — K5900 Constipation, unspecified: Secondary | ICD-10-CM | POA: Diagnosis not present

## 2015-05-31 DIAGNOSIS — Z7982 Long term (current) use of aspirin: Secondary | ICD-10-CM | POA: Diagnosis not present

## 2015-05-31 DIAGNOSIS — Z8673 Personal history of transient ischemic attack (TIA), and cerebral infarction without residual deficits: Secondary | ICD-10-CM | POA: Diagnosis not present

## 2015-05-31 LAB — CBC WITH DIFFERENTIAL/PLATELET
BASOS ABS: 0 10*3/uL (ref 0.0–0.1)
Basophils Relative: 0 %
Eosinophils Absolute: 0.1 10*3/uL (ref 0.0–0.7)
Eosinophils Relative: 1 %
HEMATOCRIT: 32.4 % — AB (ref 36.0–46.0)
HEMOGLOBIN: 10 g/dL — AB (ref 12.0–15.0)
LYMPHS PCT: 34 %
Lymphs Abs: 3.5 10*3/uL (ref 0.7–4.0)
MCH: 27.9 pg (ref 26.0–34.0)
MCHC: 30.9 g/dL (ref 30.0–36.0)
MCV: 90.5 fL (ref 78.0–100.0)
MONOS PCT: 10 %
Monocytes Absolute: 1 10*3/uL (ref 0.1–1.0)
NEUTROS ABS: 5.7 10*3/uL (ref 1.7–7.7)
NEUTROS PCT: 55 %
Platelets: 225 10*3/uL (ref 150–400)
RBC: 3.58 MIL/uL — ABNORMAL LOW (ref 3.87–5.11)
RDW: 12.8 % (ref 11.5–15.5)
WBC: 10.3 10*3/uL (ref 4.0–10.5)

## 2015-05-31 LAB — I-STAT CHEM 8, ED
BUN: 62 mg/dL — ABNORMAL HIGH (ref 6–20)
CHLORIDE: 104 mmol/L (ref 101–111)
Calcium, Ion: 1.15 mmol/L (ref 1.13–1.30)
Creatinine, Ser: 2.4 mg/dL — ABNORMAL HIGH (ref 0.44–1.00)
Glucose, Bld: 85 mg/dL (ref 65–99)
HCT: 32 % — ABNORMAL LOW (ref 36.0–46.0)
Hemoglobin: 10.9 g/dL — ABNORMAL LOW (ref 12.0–15.0)
Potassium: 4.9 mmol/L (ref 3.5–5.1)
Sodium: 139 mmol/L (ref 135–145)
TCO2: 24 mmol/L (ref 0–100)

## 2015-05-31 LAB — COMPREHENSIVE METABOLIC PANEL
ALBUMIN: 2.9 g/dL — AB (ref 3.5–5.0)
ALK PHOS: 131 U/L — AB (ref 38–126)
ALT: 130 U/L — AB (ref 14–54)
AST: 188 U/L — AB (ref 15–41)
Anion gap: 11 (ref 5–15)
BUN: 61 mg/dL — AB (ref 6–20)
CO2: 25 mmol/L (ref 22–32)
CREATININE: 2.55 mg/dL — AB (ref 0.44–1.00)
Calcium: 8.9 mg/dL (ref 8.9–10.3)
Chloride: 103 mmol/L (ref 101–111)
GFR calc Af Amer: 20 mL/min — ABNORMAL LOW (ref >60)
GFR calc non Af Amer: 17 mL/min — ABNORMAL LOW (ref >60)
Glucose, Bld: 91 mg/dL (ref 65–99)
Potassium: 4.9 mmol/L (ref 3.5–5.1)
SODIUM: 139 mmol/L (ref 135–145)
Total Bilirubin: 0.3 mg/dL (ref 0.3–1.2)
Total Protein: 6.6 g/dL (ref 6.5–8.1)

## 2015-05-31 LAB — I-STAT TROPONIN, ED: Troponin i, poc: 0 ng/mL (ref 0.00–0.08)

## 2015-05-31 LAB — URINE MICROSCOPIC-ADD ON

## 2015-05-31 LAB — I-STAT CG4 LACTIC ACID, ED
LACTIC ACID, VENOUS: 0.86 mmol/L (ref 0.5–2.0)
Lactic Acid, Venous: 0.85 mmol/L (ref 0.5–2.0)

## 2015-05-31 LAB — URINALYSIS, ROUTINE W REFLEX MICROSCOPIC
Bilirubin Urine: NEGATIVE
Glucose, UA: NEGATIVE mg/dL
Ketones, ur: NEGATIVE mg/dL
Nitrite: NEGATIVE
PH: 5 (ref 5.0–8.0)
Protein, ur: 100 mg/dL — AB
SPECIFIC GRAVITY, URINE: 1.017 (ref 1.005–1.030)

## 2015-05-31 MED ORDER — ESCITALOPRAM OXALATE 20 MG PO TABS
20.0000 mg | ORAL_TABLET | Freq: Every day | ORAL | Status: DC
  Administered 2015-06-01 – 2015-06-07 (×7): 20 mg via ORAL
  Filled 2015-05-31 (×7): qty 1

## 2015-05-31 MED ORDER — PANTOPRAZOLE SODIUM 40 MG PO TBEC: 40.0000 mg | DELAYED_RELEASE_TABLET | Freq: Every day | ORAL | Status: DC

## 2015-05-31 MED ORDER — DEXTROSE 5 % IV SOLN
1.0000 g | INTRAVENOUS | Status: DC
  Administered 2015-06-01: 1 g via INTRAVENOUS
  Filled 2015-05-31 (×2): qty 1

## 2015-05-31 MED ORDER — INSULIN ASPART 100 UNIT/ML ~~LOC~~ SOLN
0.0000 [IU] | Freq: Three times a day (TID) | SUBCUTANEOUS | Status: DC
  Administered 2015-06-01: 2 [IU] via SUBCUTANEOUS
  Administered 2015-06-01: 3 [IU] via SUBCUTANEOUS
  Administered 2015-06-02: 8 [IU] via SUBCUTANEOUS
  Administered 2015-06-02 – 2015-06-03 (×3): 5 [IU] via SUBCUTANEOUS
  Administered 2015-06-03: 3 [IU] via SUBCUTANEOUS
  Administered 2015-06-04: 5 [IU] via SUBCUTANEOUS
  Administered 2015-06-04 – 2015-06-05 (×3): 3 [IU] via SUBCUTANEOUS
  Administered 2015-06-05: 8 [IU] via SUBCUTANEOUS
  Administered 2015-06-05: 14 [IU] via SUBCUTANEOUS
  Administered 2015-06-06: 3 [IU] via SUBCUTANEOUS
  Administered 2015-06-06: 11 [IU] via SUBCUTANEOUS
  Administered 2015-06-06: 3 [IU] via SUBCUTANEOUS
  Administered 2015-06-07: 5 [IU] via SUBCUTANEOUS
  Administered 2015-06-07: 11 [IU] via SUBCUTANEOUS

## 2015-05-31 MED ORDER — AMITRIPTYLINE HCL 25 MG PO TABS: 25.0000 mg | ORAL_TABLET | Freq: Every day | ORAL | Status: DC

## 2015-05-31 MED ORDER — OCUVITE-LUTEIN PO CAPS
2.0000 | ORAL_CAPSULE | Freq: Every day | ORAL | Status: DC
  Administered 2015-06-01 – 2015-06-07 (×5): 2 via ORAL
  Filled 2015-05-31 (×7): qty 2

## 2015-05-31 MED ORDER — ROSUVASTATIN CALCIUM 20 MG PO TABS
20.0000 mg | ORAL_TABLET | Freq: Every evening | ORAL | Status: DC
  Administered 2015-06-01 – 2015-06-06 (×7): 20 mg via ORAL
  Filled 2015-05-31 (×9): qty 1

## 2015-05-31 MED ORDER — SODIUM CHLORIDE 0.9 % IV BOLUS (SEPSIS)
1000.0000 mL | Freq: Once | INTRAVENOUS | Status: AC
Start: 2015-05-31 — End: 2015-05-31
  Administered 2015-05-31: 1000 mL via INTRAVENOUS

## 2015-05-31 MED ORDER — ASPIRIN 81 MG PO CHEW
81.0000 mg | CHEWABLE_TABLET | Freq: Every day | ORAL | Status: DC
  Administered 2015-06-01 – 2015-06-07 (×7): 81 mg via ORAL
  Filled 2015-05-31 (×7): qty 1

## 2015-05-31 MED ORDER — DOCUSATE SODIUM 100 MG PO CAPS: 100.0000 mg | ORAL_CAPSULE | Freq: Two times a day (BID) | ORAL | Status: DC

## 2015-05-31 MED ORDER — OCUVITE-LUTEIN PO CAPS: 1.0000 | ORAL_CAPSULE | Freq: Every day | ORAL | Status: DC

## 2015-05-31 MED ORDER — DOCUSATE SODIUM 100 MG PO CAPS
100.0000 mg | ORAL_CAPSULE | Freq: Two times a day (BID) | ORAL | Status: DC
  Administered 2015-06-01 – 2015-06-07 (×14): 100 mg via ORAL
  Filled 2015-05-31 (×14): qty 1

## 2015-05-31 MED ORDER — SODIUM CHLORIDE 0.9 % IV SOLN
INTRAVENOUS | Status: AC
  Administered 2015-06-01 (×3): via INTRAVENOUS

## 2015-05-31 MED ORDER — ACETAMINOPHEN 325 MG PO TABS: 650.0000 mg | ORAL_TABLET | ORAL | Status: DC | PRN

## 2015-05-31 MED ORDER — AMITRIPTYLINE HCL 25 MG PO TABS
25.0000 mg | ORAL_TABLET | Freq: Every day | ORAL | Status: DC
  Administered 2015-06-01 – 2015-06-06 (×7): 25 mg via ORAL
  Filled 2015-05-31 (×7): qty 1

## 2015-05-31 MED ORDER — ASPIRIN 81 MG PO CHEW: 81.0000 mg | CHEWABLE_TABLET | Freq: Every day | ORAL | Status: DC

## 2015-05-31 MED ORDER — ESCITALOPRAM OXALATE 20 MG PO TABS: 20.0000 mg | ORAL_TABLET | Freq: Every day | ORAL | Status: DC

## 2015-05-31 MED ORDER — DEXTROSE 5 % IV SOLN
2.0000 g | INTRAVENOUS | Status: AC
  Administered 2015-05-31: 2 g via INTRAVENOUS
  Filled 2015-05-31: qty 2

## 2015-05-31 MED ORDER — HEPARIN SODIUM (PORCINE) 5000 UNIT/ML IJ SOLN: 5000.0000 [IU] | Freq: Three times a day (TID) | INTRAMUSCULAR | Status: DC

## 2015-05-31 MED ORDER — HYDROCODONE-ACETAMINOPHEN 5-325 MG PO TABS
1.0000 | ORAL_TABLET | ORAL | Status: DC | PRN
  Administered 2015-06-01 – 2015-06-05 (×2): 2 via ORAL
  Filled 2015-05-31 (×2): qty 2

## 2015-05-31 MED ORDER — VANCOMYCIN HCL 10 G IV SOLR
1500.0000 mg | Freq: Once | INTRAVENOUS | Status: AC
  Administered 2015-05-31: 1500 mg via INTRAVENOUS
  Filled 2015-05-31: qty 1500

## 2015-05-31 MED ORDER — ALBUTEROL SULFATE (2.5 MG/3ML) 0.083% IN NEBU: 2.5000 mg | INHALATION_SOLUTION | Freq: Four times a day (QID) | RESPIRATORY_TRACT | Status: DC | PRN

## 2015-05-31 MED ORDER — INSULIN DETEMIR 100 UNIT/ML ~~LOC~~ SOLN
42.0000 [IU] | Freq: Every day | SUBCUTANEOUS | Status: DC
  Administered 2015-06-01: 42 [IU] via SUBCUTANEOUS
  Filled 2015-05-31: qty 0.42

## 2015-05-31 MED ORDER — HEPARIN SODIUM (PORCINE) 5000 UNIT/ML IJ SOLN
5000.0000 [IU] | Freq: Three times a day (TID) | INTRAMUSCULAR | Status: DC
  Administered 2015-06-01 – 2015-06-07 (×20): 5000 [IU] via SUBCUTANEOUS
  Filled 2015-05-31 (×16): qty 1

## 2015-05-31 MED ORDER — INSULIN ASPART 100 UNIT/ML ~~LOC~~ SOLN
5.0000 [IU] | Freq: Three times a day (TID) | SUBCUTANEOUS | Status: DC
  Administered 2015-06-01 (×2): 5 [IU] via SUBCUTANEOUS

## 2015-05-31 MED ORDER — GABAPENTIN 100 MG PO CAPS: 200.0000 mg | ORAL_CAPSULE | Freq: Two times a day (BID) | ORAL | Status: DC

## 2015-05-31 MED ORDER — LEVOTHYROXINE SODIUM 50 MCG PO TABS
50.0000 ug | ORAL_TABLET | Freq: Every day | ORAL | Status: DC
  Administered 2015-06-01 – 2015-06-07 (×7): 50 ug via ORAL
  Filled 2015-05-31 (×7): qty 1

## 2015-05-31 MED ORDER — PANTOPRAZOLE SODIUM 40 MG PO TBEC
40.0000 mg | DELAYED_RELEASE_TABLET | Freq: Every day | ORAL | Status: DC
  Administered 2015-06-01 – 2015-06-07 (×7): 40 mg via ORAL
  Filled 2015-05-31 (×7): qty 1

## 2015-05-31 MED ORDER — GABAPENTIN 100 MG PO CAPS
200.0000 mg | ORAL_CAPSULE | Freq: Two times a day (BID) | ORAL | Status: DC
  Administered 2015-06-01 – 2015-06-07 (×14): 200 mg via ORAL
  Filled 2015-05-31 (×15): qty 2

## 2015-05-31 NOTE — ED Notes (Signed)
MD at bedside. 

## 2015-05-31 NOTE — H&P (Signed)
[facility] History and Physical  [name] [id number] DOB: 04-29-1935 DOA: 05/31/2015  Referring physician: EDP PCP: Mayra Neer, MD   Chief Complaint: Fever, lethargy   HPI: [name] is a 81 y.o. female with h/o recurrent UTIs, DM2, HTN, patient presents to the ED with fever and lethargy that has been ongoing since Friday.  Fevers up to 102 at NH, has urinary "pressure" like sensation.  Today BP was low in the 70s at the NH so EMS was called, improved with IVF.  Patient is now feeling much better and actually eating in the room.  No CP, headache, cough, sob, abd pain, nor other symptoms.  Review of Systems: Systems reviewed.  As above, otherwise negative  Past Medical History  Diagnosis Date  . Hypertension   . Macular degeneration, bilateral   . Type II diabetes mellitus ([place])   . COPD (chronic obstructive pulmonary disease) ([place])   . Diabetic peripheral neuropathy ([place])   . Hypercholesteremia   . PAD (peripheral artery disease) ([place])     a. 1997 s/p R fem-pop bypass;  b. 03/1999 R CIA stenting.  . CKD (chronic kidney disease), stage III   . Headache(784.0)   . Hypothyroid   . Non-obstructive CAD     a. 11/2005 Cath: LM nl, LAD 30 diffuse, LCX small, 20p, 82m/d, 75d into OM, RCA dominant, 40p, 30-40 diffuse, EF 55%.  Marland Kitchen TIA (transient ischemic attack)     a. 09/2013.  . H/O echocardiogram     a. 09/2013 Echo: nl LV fxn, no rwma, mildly to mod dil LA, mildly dil RA.  . Carotid arterial disease ([place])     a. 09/2013 Carotid U/S: 40-59% bilat ICA stenosis.  . Enterococcus UTI     a. 09/2013.  . Tobacco abuse   . Cholelithiasis   . Pyelonephritis    Past Surgical History  Procedure Laterality Date  . Tonsillectomy  1940's  . Abdominal hysterectomy  1970's  . Dilation and curettage of uterus  1970's    "probably 2" (11/01/2012)  . Cataract extraction w/ intraocular lens  implant, bilateral Bilateral ~ 2010  . Bypass graft Right ~ 1997    RLE by Dr.  Gwenlyn Perking 11/29/2005 (11/01/2012)  . Cystostomy w/ bladder biopsy  2005    Archie Endo 09/19/2003 (11/01/2012)  . Anterior cervical decomp/discectomy fusion  01/02/2006    Archie Endo 01/02/2006 (11/01/2012)  . Shoulder open rotator cuff repair Bilateral 1990's-2000's    "3X on the left; twice on the right" (11/01/2012)  . Cardiac catheterization  11/29/2005    Archie Endo 11/29/2005 (11/01/2012)  . Esophagogastroduodenoscopy N/A 10/14/2013    Procedure: ESOPHAGOGASTRODUODENOSCOPY (EGD);  Surgeon: Missy Sabins, MD;  Location: [facility];  Service: Endoscopy;  Laterality: N/A;   Social History:  reports that she has been smoking Cigarettes.  She has a 7.2 pack-year smoking history. She has never used smokeless tobacco. She reports that she does not drink alcohol or use illicit drugs.  Allergies  Allergen Reactions  . Ace Inhibitors Other (See Comments)    Listed on MAR  . Codeine Other (See Comments)    Listed on MAR  . Lisinopril Other (See Comments)    Listed on MAR  . Penicillins Hives    Listed on MAR - no other information available  . Trimethoprim Other (See Comments)    Listed on MAR  . Septra [Sulfamethoxazole-Trimethoprim] Hives and Rash  . Sulfur Hives and Rash    Family History  Problem Relation Age of Onset  .  Diabetes Mother      Prior to Admission medications   Medication Sig Start Date End Date Taking? Authorizing Provider  acetaminophen (TYLENOL) 325 MG tablet Take 650 mg by mouth every 4 (four) hours as needed (for elevated temp of 100 degrees X24 hours).   Yes Historical Provider, MD  acetaminophen (TYLENOL) 500 MG tablet Take 500 mg by mouth 3 (three) times daily. 8am 12pm, 4pm   Yes Historical Provider, MD  albuterol (PROVENTIL) (2.5 MG/3ML) 0.083% nebulizer solution Take 2.5 mg by nebulization every 6 (six) hours as needed for wheezing.   Yes Historical Provider, MD  alum & mag hydroxide-simeth (MAALOX/MYLANTA) 200-200-20 MG/5ML suspension Take 30 mLs by mouth every 6 (six) hours  as needed for indigestion or heartburn. Do not exceed 4 doses in 24 hours   Yes Historical Provider, MD  amitriptyline (ELAVIL) 50 MG tablet Take 0.5 tablets (25 mg total) by mouth at bedtime. 10/13/14  Yes Ripudeep Krystal Eaton, MD  amLODipine (NORVASC) 10 MG tablet Take 10 mg by mouth at bedtime.    Yes Historical Provider, MD  aspirin 81 MG chewable tablet Chew 81 mg by mouth daily.   Yes Historical Provider, MD  B Complex-C (B-COMPLEX WITH VITAMIN C) tablet Take 1 tablet by mouth daily.   Yes Historical Provider, MD  bisacodyl (DULCOLAX) 10 MG suppository Place 10 mg rectally as needed for moderate constipation.   Yes Historical Provider, MD  carvedilol (COREG) 6.25 MG tablet Take 1 tablet (6.25 mg total) by mouth 2 (two) times daily with a meal. Patient taking differently: Take 6.25 mg by mouth 2 (two) times daily with a meal. 8am, 5pm 10/13/14  Yes Ripudeep K Rai, MD  cholecalciferol (VITAMIN D) 1000 units tablet Take 1,000 Units by mouth daily.   Yes Historical Provider, MD  cloNIDine (CATAPRES) 0.2 MG tablet Take 0.2 mg by mouth 2 (two) times daily. Hold if blood pressure less than 100/60 (8am, 8pm)   Yes Historical Provider, MD  Cranberry 250 MG TABS Take 1,000 mg by mouth 2 (two) times daily.   Yes Historical Provider, MD  docusate sodium (COLACE) 100 MG capsule Take 1 capsule (100 mg total) by mouth every 12 (twelve) hours. 08/07/14  Yes Kristen N Ward, DO  escitalopram (LEXAPRO) 20 MG tablet Take 20 mg by mouth daily.   Yes Historical Provider, MD  famotidine (PEPCID) 20 MG tablet Take 1 tablet (20 mg total) by mouth 2 (two) times daily as needed for heartburn or indigestion. 10/08/14  Yes Debbe Odea, MD  furosemide (LASIX) 40 MG tablet Take 0.5 tablets (20 mg total) by mouth daily. Patient taking differently: Take 40 mg by mouth daily.  10/13/14  Yes Ripudeep Krystal Eaton, MD  gabapentin (NEURONTIN) 100 MG capsule Take 2 capsules (200 mg total) by mouth 2 (two) times daily. Patient taking differently:  Take 200 mg by mouth 2 (two) times daily. 8am, 8pm 10/13/14  Yes Ripudeep K Rai, MD  guaiFENesin (ROBITUSSIN) 100 MG/5ML liquid Take 100 mg by mouth every 6 (six) hours as needed for cough. Not to exceed 4 doses in 24 hours   Yes Historical Provider, MD  HYDROcodone-acetaminophen (NORCO/VICODIN) 5-325 MG per tablet Take 1 tablet by mouth every 6 (six) hours as needed for moderate pain or severe pain. Patient taking differently: Take 2 tablets by mouth every 4 (four) hours as needed for severe pain (pain).  10/13/14  Yes Ripudeep K Rai, MD  insulin aspart (NOVOLOG) 100 UNIT/ML injection Inject 0-9 Units into the  skin 3 (three) times daily with meals. Sliding scale CBG 70 - 120: 0 units CBG 121 - 150: 1 unit,  CBG 151 - 200: 2 units,  CBG 201 - 250: 3 units,  CBG 251 - 300: 5 units,  CBG 301 - 350: 7 units,  CBG 351 - 400: 9 units   CBG > 400: 9 units and notify your MD Patient taking differently: Inject 5-14 Units into the skin 3 (three) times daily with meals. Inject 5 units subcutaneously with each meal plus Sliding scale CBG 70 - 120: 0 units CBG 121 - 150: 1 unit,  CBG 151 - 200: 2 units,  CBG 201 - 250: 3 units,  CBG 251 - 300: 5 units,  CBG 301 - 350: 7 units,  CBG 351 - 400: 9 units   CBG > 400: 9 units and notify your MD 10/13/14  Yes Ripudeep K Rai, MD  insulin detemir (LEVEMIR) 100 UNIT/ML injection Inject 30-42 Units into the skin at bedtime. Inject 42 units subcutaneously every morning and 30 units daily at bedtime   Yes Historical Provider, MD  levothyroxine (SYNTHROID, LEVOTHROID) 50 MCG tablet Take 50 mcg by mouth daily before breakfast.    Yes Historical Provider, MD  loperamide (IMODIUM) 2 MG capsule Take 2 mg by mouth as needed for diarrhea or loose stools. Do not exceed 8 doses in 24 hours   Yes Historical Provider, MD  Multiple Vitamins-Minerals (PRESERVISION AREDS 2) CAPS Take 1-2 capsules by mouth 2 (two) times daily. Take 2 capsules by mouth every morning and 1 capsule in the evening    Yes Historical Provider, MD  ondansetron (ZOFRAN ODT) 4 MG disintegrating tablet Take 1 tablet (4 mg total) by mouth every 8 (eight) hours as needed for nausea or vomiting. 08/07/14  Yes Kristen N Ward, DO  pantoprazole (PROTONIX) 40 MG tablet Take 1 tablet (40 mg total) by mouth daily. 08/13/14  Yes Reyne Dumas, MD  polyethylene glycol (MIRALAX / GLYCOLAX) packet Take 17 g by mouth daily as needed for mild constipation, moderate constipation or severe constipation. Mix in 8 oz liquid and drink 10/13/14  Yes Ripudeep K Rai, MD  rosuvastatin (CRESTOR) 20 MG tablet Take 20 mg by mouth every evening. 4pm   Yes Historical Provider, MD   Physical Exam: Filed Vitals:   05/31/15 1900 05/31/15 1915  BP: 138/96 102/78  Pulse: 58 62  Temp:    Resp: 13 14    BP 102/78 mmHg  Pulse 62  Temp(Src) 99 F (37.2 C) (Rectal)  Resp 14  Wt 80.74 kg (178 lb)  SpO2 92%  General Appearance:    Alert, oriented, no distress, appears stated age  Head:    Normocephalic, atraumatic  Eyes:    PERRL, EOMI, sclera non-icteric        Nose:   Nares without drainage or epistaxis. Mucosa, turbinates normal  Throat:   Moist mucous membranes. Oropharynx without erythema or exudate.  Neck:   Supple. No carotid bruits.  No thyromegaly.  No lymphadenopathy.   Back:     No CVA tenderness, no spinal tenderness  Lungs:     Clear to auscultation bilaterally, without wheezes, rhonchi or rales  Chest wall:    No tenderness to palpitation  Heart:    Regular rate and rhythm without murmurs, gallops, rubs  Abdomen:     Soft, non-tender, nondistended, normal bowel sounds, no organomegaly  Genitalia:    deferred  Rectal:    deferred  Extremities:  No clubbing, cyanosis or edema.  Pulses:   2+ and symmetric all extremities  Skin:   Skin color, texture, turgor normal, no rashes or lesions  Lymph nodes:   Cervical, supraclavicular, and axillary nodes normal  Neurologic:   CNII-XII intact. Normal strength, sensation and reflexes       throughout    Labs on Admission:  Basic Metabolic Panel:  Recent Labs Lab 05/31/15 1653 05/31/15 1704  NA 139 139  K 4.9 4.9  CL 103 104  CO2 25  --   GLUCOSE 91 85  BUN 61* 62*  CREATININE 2.55* 2.40*  CALCIUM 8.9  --    Liver Function Tests:  Recent Labs Lab 05/31/15 1653  AST 188*  ALT 130*  ALKPHOS 131*  BILITOT 0.3  PROT 6.6  ALBUMIN 2.9*   No results for input(s): LIPASE, AMYLASE in the last 168 hours. No results for input(s): AMMONIA in the last 168 hours. CBC:  Recent Labs Lab 05/31/15 1653 05/31/15 1704  WBC 10.3  --   NEUTROABS 5.7  --   HGB 10.0* 10.9*  HCT 32.4* 32.0*  MCV 90.5  --   PLT 225  --    Cardiac Enzymes: No results for input(s): CKTOTAL, CKMB, CKMBINDEX, TROPONINI in the last 168 hours.  BNP (last 3 results) No results for input(s): PROBNP in the last 8760 hours. CBG: No results for input(s): GLUCAP in the last 168 hours.  Radiological Exams on Admission: Dg Chest Portable 1 View  05/31/2015  CLINICAL DATA:  Fever, weakness and lethargy beginning Friday, fever to 102 degrees, frequent UTIs, history hypertension, type II diabetes mellitus, COPD, smoker EXAM: PORTABLE CHEST 1 VIEW COMPARISON:  Portable exam 1656 hours compared to 10/09/2014 FINDINGS: Rotation to the RIGHT. Upper normal heart size. Mediastinal contours and pulmonary vascularity normal. Bronchitic changes with resolution of pulmonary edema versus previous exam. No acute infiltrate, pleural effusion or pneumothorax. Prior cervical spine fusion. Bones demineralized. Atherosclerotic calcification aorta. IMPRESSION: Bronchitic changes. Resolution of pulmonary edema seen on previous exam. No acute abnormalities. Electronically Signed   By: Lavonia Dana M.D.   On: 05/31/2015 17:09    EKG: Independently reviewed.  Assessment/Plan Principal Problem:   UTI (lower urinary tract infection) Active Problems:   Diabetes mellitus without complication (HCC)   Essential  hypertension   Diastolic dysfunction, grade II   Acute on chronic renal failure ([place])   1. UTI - 1. Cefepime per pharm for now 2. Cultures pending 2. AKI on CKD - 1. Pre renal due to #1 2. IVF 3. Hold lasix 4. Repeat BMP in AM 3. HTN - was HYPOtensive on presentation 1. Hold BP meds 4. Diastolic dysfunction - Hold lasix and watch for signs of fluid overload with IVF ressusitation 5. DM2 - 1. Reducing levemir to 42 Qday, patient didn't get her 30 units evening dose today due to poor PO intake 2. Continue 5 units with meals 3. Increase SSI to moderate dose from home low dose 4. CBG checks AC/HS    Code Status: DNR - verified with pt and family Family Communication: Family at bedside Disposition Plan: Admit to inpatient   Time spent: 70 min  GARDNER, JARED M. [facility] Pager [phone]  If 7AM-7PM, please contact the day team taking care of the patient [email or web address] Password [facility] 05/31/2015, 7:49 PM

## 2015-05-31 NOTE — Progress Notes (Signed)
ANTIBIOTIC CONSULT NOTE - INITIAL  Pharmacy Consult for Cefepime Indication: rule out sepsis  Allergies  Allergen Reactions  . Ace Inhibitors Other (See Comments)    Listed on MAR  . Codeine Other (See Comments)    Listed on MAR  . Lisinopril Other (See Comments)    unknown  . Penicillins Hives  . Trimethoprim   . Septra [Sulfamethoxazole-Trimethoprim] Hives and Rash  . Sulfur Hives and Rash    Patient Measurements:   Adjusted Body Weight:   Vital Signs:   Intake/Output from previous day:   Intake/Output from this shift:    Labs: No results for input(s): WBC, HGB, PLT, LABCREA, CREATININE in the last 72 hours. CrCl cannot be calculated (Unknown ideal weight.). No results for input(s): VANCOTROUGH, VANCOPEAK, VANCORANDOM, GENTTROUGH, GENTPEAK, GENTRANDOM, TOBRATROUGH, TOBRAPEAK, TOBRARND, AMIKACINPEAK, AMIKACINTROU, AMIKACIN in the last 72 hours.   Microbiology: No results found for this or any previous visit (from the past 720 hour(s)).  Medical History: Past Medical History  Diagnosis Date  . Hypertension   . Macular degeneration, bilateral   . Type II diabetes mellitus ([place])   . COPD (chronic obstructive pulmonary disease) ([place])   . Diabetic peripheral neuropathy ([place])   . Hypercholesteremia   . PAD (peripheral artery disease) ([place])     a. 1997 s/p R fem-pop bypass;  b. 03/1999 R CIA stenting.  . CKD (chronic kidney disease), stage III   . Headache(784.0)   . Hypothyroid   . Non-obstructive CAD     a. 11/2005 Cath: LM nl, LAD 30 diffuse, LCX small, 20p, 55m/d, 75d into OM, RCA dominant, 40p, 30-40 diffuse, EF 55%.  Marland Kitchen TIA (transient ischemic attack)     a. 09/2013.  . H/O echocardiogram     a. 09/2013 Echo: nl LV fxn, no rwma, mildly to mod dil LA, mildly dil RA.  . Carotid arterial disease ([place])     a. 09/2013 Carotid U/S: 40-59% bilat ICA stenosis.  . Enterococcus UTI     a. 09/2013.  . Tobacco abuse   . Cholelithiasis   . Pyelonephritis      Medications:   (Not in a hospital admission) Scheduled:   Infusions:  . sodium chloride    . vancomycin     Assessment: 81yo female presents from SNF with weakness, fever and lethargy. Pharmacy is consulted to dose cefepime for suspected sepsis. Pt is now afebrile, WBC 10.3, sCr 2.4, LA 0.86.   Vancomycin 1500mg  and cefepime 2g IV were ordered one time for the ED.  Goal of Therapy:  Vancomycin trough level 15-20 mcg/ml  Plan:  Cefepime 1g IV q24h Vancomycin 1g IV q24h if continued Measure antibiotic drug levels at steady state Follow up culture results, renal function and clinical course  Andrey Cota. Diona Foley, PharmD, [place] Clinical Pharmacist Pager [phone] 05/31/2015,4:47 PM

## 2015-05-31 NOTE — ED Provider Notes (Signed)
CSN: [id number]     Arrival date & time 05/31/15  1634 History   First MD Initiated Contact with Patient 05/31/15 1641     Chief Complaint  Patient presents with  . Weakness  . Fatigue     (Consider location/radiation/quality/duration/timing/severity/associated sxs/prior Treatment) HPI   42 y F w/ mult comorbid conditions who presents with fever and lethargy since Friday.  Patient has been acting more lethargic, has had fevers up to 102 without any other reported infectious sx.  Patient has not had headache, cough, sob, chest pain, abdominal pain or other sx.  EMS was called out and reportedly had bp of 77/60 with them which improved to 90's after fluid administration with EMS  Past Medical History  Diagnosis Date  . Hypertension   . Macular degeneration, bilateral   . Type II diabetes mellitus ([place])   . COPD (chronic obstructive pulmonary disease) ([place])   . Diabetic peripheral neuropathy ([place])   . Hypercholesteremia   . PAD (peripheral artery disease) ([place])     a. 1997 s/p R fem-pop bypass;  b. 03/1999 R CIA stenting.  . CKD (chronic kidney disease), stage III   . Headache(784.0)   . Hypothyroid   . Non-obstructive CAD     a. 11/2005 Cath: LM nl, LAD 30 diffuse, LCX small, 20p, 45m/d, 75d into OM, RCA dominant, 40p, 30-40 diffuse, EF 55%.  Marland Kitchen TIA (transient ischemic attack)     a. 09/2013.  . H/O echocardiogram     a. 09/2013 Echo: nl LV fxn, no rwma, mildly to mod dil LA, mildly dil RA.  . Carotid arterial disease ([place])     a. 09/2013 Carotid U/S: 40-59% bilat ICA stenosis.  . Enterococcus UTI     a. 09/2013.  . Tobacco abuse   . Cholelithiasis   . Pyelonephritis    Past Surgical History  Procedure Laterality Date  . Tonsillectomy  1940's  . Abdominal hysterectomy  1970's  . Dilation and curettage of uterus  1970's    "probably 2" (11/01/2012)  . Cataract extraction w/ intraocular lens  implant, bilateral Bilateral ~ 2010  . Bypass graft Right ~ 1997    RLE by Dr.  Gwenlyn Perking 11/29/2005 (11/01/2012)  . Cystostomy w/ bladder biopsy  2005    Archie Endo 09/19/2003 (11/01/2012)  . Anterior cervical decomp/discectomy fusion  01/02/2006    Archie Endo 01/02/2006 (11/01/2012)  . Shoulder open rotator cuff repair Bilateral 1990's-2000's    "3X on the left; twice on the right" (11/01/2012)  . Cardiac catheterization  11/29/2005    Archie Endo 11/29/2005 (11/01/2012)  . Esophagogastroduodenoscopy N/A 10/14/2013    Procedure: ESOPHAGOGASTRODUODENOSCOPY (EGD);  Surgeon: Missy Sabins, MD;  Location: [facility];  Service: Endoscopy;  Laterality: N/A;   Family History  Problem Relation Age of Onset  . Diabetes Mother    Social History  Substance Use Topics  . Smoking status: Current Every Day Smoker -- 0.12 packs/day for 60 years    Types: Cigarettes  . Smokeless tobacco: Never Used     Comment: states she smokes 4 cigs per day  . Alcohol Use: No   OB History    No data available     Review of Systems  Constitutional: Positive for fever. Negative for chills.  HENT: Negative for nosebleeds.   Eyes: Negative for visual disturbance.  Respiratory: Negative for cough and shortness of breath.   Cardiovascular: Negative for chest pain.  Gastrointestinal: Negative for nausea, vomiting, abdominal pain, diarrhea and constipation.  Genitourinary: Negative for  dysuria.  Skin: Negative for rash.  Neurological: Positive for weakness.  All other systems reviewed and are negative.     Allergies  Ace inhibitors; Codeine; Lisinopril; Penicillins; Trimethoprim; Septra; and Sulfur  Home Medications   Prior to Admission medications   Medication Sig Start Date End Date Taking? Authorizing Provider  acetaminophen (TYLENOL) 325 MG tablet Take 650 mg by mouth every 4 (four) hours as needed (for elevated temp of 100 degrees X24 hours).   Yes Historical Provider, MD  acetaminophen (TYLENOL) 500 MG tablet Take 500 mg by mouth 3 (three) times daily. 8am 12pm, 4pm   Yes Historical Provider, MD   albuterol (PROVENTIL) (2.5 MG/3ML) 0.083% nebulizer solution Take 2.5 mg by nebulization every 6 (six) hours as needed for wheezing.   Yes Historical Provider, MD  alum & mag hydroxide-simeth (MAALOX/MYLANTA) 200-200-20 MG/5ML suspension Take 30 mLs by mouth every 6 (six) hours as needed for indigestion or heartburn. Do not exceed 4 doses in 24 hours   Yes Historical Provider, MD  amitriptyline (ELAVIL) 50 MG tablet Take 0.5 tablets (25 mg total) by mouth at bedtime. 10/13/14  Yes Ripudeep Krystal Eaton, MD  amLODipine (NORVASC) 10 MG tablet Take 10 mg by mouth at bedtime.    Yes Historical Provider, MD  aspirin 81 MG chewable tablet Chew 81 mg by mouth daily.   Yes Historical Provider, MD  B Complex-C (B-COMPLEX WITH VITAMIN C) tablet Take 1 tablet by mouth daily.   Yes Historical Provider, MD  bisacodyl (DULCOLAX) 10 MG suppository Place 10 mg rectally as needed for moderate constipation.   Yes Historical Provider, MD  carvedilol (COREG) 6.25 MG tablet Take 1 tablet (6.25 mg total) by mouth 2 (two) times daily with a meal. Patient taking differently: Take 6.25 mg by mouth 2 (two) times daily with a meal. 8am, 5pm 10/13/14  Yes Ripudeep K Rai, MD  cholecalciferol (VITAMIN D) 1000 units tablet Take 1,000 Units by mouth daily.   Yes Historical Provider, MD  cloNIDine (CATAPRES) 0.2 MG tablet Take 0.2 mg by mouth 2 (two) times daily. Hold if blood pressure less than 100/60 (8am, 8pm)   Yes Historical Provider, MD  Cranberry 250 MG TABS Take 1,000 mg by mouth 2 (two) times daily.   Yes Historical Provider, MD  docusate sodium (COLACE) 100 MG capsule Take 1 capsule (100 mg total) by mouth every 12 (twelve) hours. 08/07/14  Yes Kristen N Ward, DO  escitalopram (LEXAPRO) 20 MG tablet Take 20 mg by mouth daily.   Yes Historical Provider, MD  famotidine (PEPCID) 20 MG tablet Take 1 tablet (20 mg total) by mouth 2 (two) times daily as needed for heartburn or indigestion. 10/08/14  Yes Debbe Odea, MD  furosemide  (LASIX) 40 MG tablet Take 0.5 tablets (20 mg total) by mouth daily. Patient taking differently: Take 40 mg by mouth daily.  10/13/14  Yes Ripudeep Krystal Eaton, MD  gabapentin (NEURONTIN) 100 MG capsule Take 2 capsules (200 mg total) by mouth 2 (two) times daily. Patient taking differently: Take 200 mg by mouth 2 (two) times daily. 8am, 8pm 10/13/14  Yes Ripudeep K Rai, MD  guaiFENesin (ROBITUSSIN) 100 MG/5ML liquid Take 100 mg by mouth every 6 (six) hours as needed for cough. Not to exceed 4 doses in 24 hours   Yes Historical Provider, MD  HYDROcodone-acetaminophen (NORCO/VICODIN) 5-325 MG per tablet Take 1 tablet by mouth every 6 (six) hours as needed for moderate pain or severe pain. Patient taking differently: Take 2 tablets  by mouth every 4 (four) hours as needed for severe pain (pain).  10/13/14  Yes Ripudeep Krystal Eaton, MD  insulin aspart (NOVOLOG) 100 UNIT/ML injection Inject 0-9 Units into the skin 3 (three) times daily with meals. Sliding scale CBG 70 - 120: 0 units CBG 121 - 150: 1 unit,  CBG 151 - 200: 2 units,  CBG 201 - 250: 3 units,  CBG 251 - 300: 5 units,  CBG 301 - 350: 7 units,  CBG 351 - 400: 9 units   CBG > 400: 9 units and notify your MD Patient taking differently: Inject 5-14 Units into the skin 3 (three) times daily with meals. Inject 5 units subcutaneously with each meal plus Sliding scale CBG 70 - 120: 0 units CBG 121 - 150: 1 unit,  CBG 151 - 200: 2 units,  CBG 201 - 250: 3 units,  CBG 251 - 300: 5 units,  CBG 301 - 350: 7 units,  CBG 351 - 400: 9 units   CBG > 400: 9 units and notify your MD 10/13/14  Yes Ripudeep K Rai, MD  insulin detemir (LEVEMIR) 100 UNIT/ML injection Inject 30-42 Units into the skin at bedtime. Inject 42 units subcutaneously every morning and 30 units daily at bedtime   Yes Historical Provider, MD  levothyroxine (SYNTHROID, LEVOTHROID) 50 MCG tablet Take 50 mcg by mouth daily before breakfast.    Yes Historical Provider, MD  loperamide (IMODIUM) 2 MG capsule Take 2 mg by  mouth as needed for diarrhea or loose stools. Do not exceed 8 doses in 24 hours   Yes Historical Provider, MD  Multiple Vitamins-Minerals (PRESERVISION AREDS 2) CAPS Take 1-2 capsules by mouth 2 (two) times daily. Take 2 capsules by mouth every morning and 1 capsule in the evening   Yes Historical Provider, MD  ondansetron (ZOFRAN ODT) 4 MG disintegrating tablet Take 1 tablet (4 mg total) by mouth every 8 (eight) hours as needed for nausea or vomiting. 08/07/14  Yes Kristen N Ward, DO  pantoprazole (PROTONIX) 40 MG tablet Take 1 tablet (40 mg total) by mouth daily. 08/13/14  Yes Reyne Dumas, MD  polyethylene glycol (MIRALAX / GLYCOLAX) packet Take 17 g by mouth daily as needed for mild constipation, moderate constipation or severe constipation. Mix in 8 oz liquid and drink 10/13/14  Yes Ripudeep K Rai, MD  rosuvastatin (CRESTOR) 20 MG tablet Take 20 mg by mouth every evening. 4pm   Yes Historical Provider, MD   BP 137/57 mmHg  Pulse 60  Temp(Src) 98.6 F (37 C) (Oral)  Resp 17  Ht 5\' 3"  (1.6 m)  Wt 83.28 kg  BMI 32.53 kg/m2  SpO2 93% Physical Exam  Constitutional: She is oriented to person, place, and time. No distress.  HENT:  Head: Normocephalic and atraumatic.  Eyes: EOM are normal. Pupils are equal, round, and reactive to light.  Neck: Normal range of motion. Neck supple.  Cardiovascular: Normal rate and intact distal pulses.   Pulmonary/Chest: No respiratory distress.  Abdominal: Soft. There is no tenderness.  Musculoskeletal: Normal range of motion.  Neurological: She is oriented to person, place, and time.  Sleepy but easily arousable to voice. Intact motor strength in all four extremities.  Skin: No rash noted. She is not diaphoretic.  Psychiatric: She has a normal mood and affect.    ED Course  Procedures (including critical care time) Labs Review Labs Reviewed  COMPREHENSIVE METABOLIC PANEL - Abnormal; Notable for the following:    BUN 61 (*)  Creatinine, Ser 2.55 (*)     Albumin 2.9 (*)    AST 188 (*)    ALT 130 (*)    Alkaline Phosphatase 131 (*)    GFR calc non Af Amer 17 (*)    GFR calc Af Amer 20 (*)    All other components within normal limits  CBC WITH DIFFERENTIAL/PLATELET - Abnormal; Notable for the following:    RBC 3.58 (*)    Hemoglobin 10.0 (*)    HCT 32.4 (*)    All other components within normal limits  URINALYSIS, ROUTINE W REFLEX MICROSCOPIC (NOT AT [facility]) - Abnormal; Notable for the following:    APPearance TURBID (*)    Hgb urine dipstick MODERATE (*)    Protein, ur 100 (*)    Leukocytes, UA LARGE (*)    All other components within normal limits  URINE MICROSCOPIC-ADD ON - Abnormal; Notable for the following:    Squamous Epithelial / LPF 6-30 (*)    Bacteria, UA MANY (*)    All other components within normal limits  CBC - Abnormal; Notable for the following:    WBC 10.9 (*)    RBC 3.30 (*)    Hemoglobin 9.2 (*)    HCT 30.4 (*)    All other components within normal limits  BASIC METABOLIC PANEL - Abnormal; Notable for the following:    Glucose, Bld 157 (*)    BUN 51 (*)    Creatinine, Ser 1.98 (*)    Calcium 8.2 (*)    GFR calc non Af Amer 23 (*)    GFR calc Af Amer 27 (*)    All other components within normal limits  GLUCOSE, CAPILLARY - Abnormal; Notable for the following:    Glucose-Capillary 144 (*)    All other components within normal limits  GLUCOSE, CAPILLARY - Abnormal; Notable for the following:    Glucose-Capillary 105 (*)    All other components within normal limits  IRON AND TIBC - Abnormal; Notable for the following:    Iron 19 (*)    TIBC 235 (*)    Saturation Ratios 8 (*)    All other components within normal limits  GLUCOSE, CAPILLARY - Abnormal; Notable for the following:    Glucose-Capillary 154 (*)    All other components within normal limits  I-STAT CHEM 8, ED - Abnormal; Notable for the following:    BUN 62 (*)    Creatinine, Ser 2.40 (*)    Hemoglobin 10.9 (*)    HCT 32.0 (*)    All  other components within normal limits  URINE CULTURE  CULTURE, BLOOD (ROUTINE X 2)  CULTURE, BLOOD (ROUTINE X 2)  MRSA PCR SCREENING  VITAMIN B12  FERRITIN  I-STAT TROPOININ, ED  I-STAT CG4 LACTIC ACID, ED  I-STAT CG4 LACTIC ACID, ED  I-STAT CG4 LACTIC ACID, ED    Imaging Review Dg Chest Portable 1 View  05/31/2015  CLINICAL DATA:  Fever, weakness and lethargy beginning Friday, fever to 102 degrees, frequent UTIs, history hypertension, type II diabetes mellitus, COPD, smoker EXAM: PORTABLE CHEST 1 VIEW COMPARISON:  Portable exam 1656 hours compared to 10/09/2014 FINDINGS: Rotation to the RIGHT. Upper normal heart size. Mediastinal contours and pulmonary vascularity normal. Bronchitic changes with resolution of pulmonary edema versus previous exam. No acute infiltrate, pleural effusion or pneumothorax. Prior cervical spine fusion. Bones demineralized. Atherosclerotic calcification aorta. IMPRESSION: Bronchitic changes. Resolution of pulmonary edema seen on previous exam. No acute abnormalities. Electronically Signed   By: Elta Gu[place]  Thornton Papas M.D.   On: 05/31/2015 17:09   I have personally reviewed and evaluated these images and lab results as part of my medical decision-making.   EKG Interpretation   Date/Time:  Sunday May 31 2015 16:49:28 EST Ventricular Rate:  54 PR Interval:  260 QRS Duration: 149 QT Interval:  459 QTC Calculation: 435 R Axis:   -36 Text Interpretation:  Sinus rhythm Prolonged PR interval Consider left  atrial enlargement Right bundle branch block Probable lateral infarct, old  Sinus rhythm Right bundle branch block T wave abnormality Abnormal ekg  Confirmed by Carmin Muskrat  MD [phone]) on 05/31/2015 5:08:49 PM      MDM   Final diagnoses:  UTI (lower urinary tract infection)  Sepsis, due to unspecified organism [facility])    78 y F w/ mult comorbid conditions who presents with fever and lethargy since Friday.   Exam as above.  Concern for sepsis.  Will cover  with vanc/cefepime.  Will get blood cx x2, cbc/cmp/lactic/ua/cxr No headache, doubt meningitis No sig abdominal ttp, doubt dangerous intra-abdominal cause.  ua appears infected, bp's remain stable, patient is appearing more well.  No lactic acidosis.  Will admit for further treatment of urosepsis.  No acute events in the ED      Jarome Matin, MD 06/01/15 [id number]  Jarome Matin, MD 06/01/15 [place], MD 06/04/15 [phone]

## 2015-05-31 NOTE — ED Notes (Signed)
Pt arrives via EMS from [place] home. C/o weakness, fever and lethargy. Symptoms began Friday. Daughter came in today, pt was running 102 fever, 1000 mg tylenol given 1300, fever subsided. BP 77/60, HR 50 SR, RR 20. Frequent hx of UTI. 500 ml NS given BP sys now 98.

## 2015-06-01 DIAGNOSIS — M62838 Other muscle spasm: Secondary | ICD-10-CM | POA: Diagnosis not present

## 2015-06-01 DIAGNOSIS — N184 Chronic kidney disease, stage 4 (severe): Secondary | ICD-10-CM | POA: Diagnosis present

## 2015-06-01 LAB — GLUCOSE, CAPILLARY
GLUCOSE-CAPILLARY: 105 mg/dL — AB (ref 65–99)
GLUCOSE-CAPILLARY: 154 mg/dL — AB (ref 65–99)
GLUCOSE-CAPILLARY: 44 mg/dL — AB (ref 65–99)
GLUCOSE-CAPILLARY: 196 mg/dL — AB (ref 65–99)
Glucose-Capillary: 144 mg/dL — ABNORMAL HIGH (ref 65–99)
Glucose-Capillary: 74 mg/dL (ref 65–99)

## 2015-06-01 LAB — CBC
HCT: 30.4 % — ABNORMAL LOW (ref 36.0–46.0)
Hemoglobin: 9.2 g/dL — ABNORMAL LOW (ref 12.0–15.0)
MCH: 27.9 pg (ref 26.0–34.0)
MCHC: 30.3 g/dL (ref 30.0–36.0)
MCV: 92.1 fL (ref 78.0–100.0)
PLATELETS: 189 10*3/uL (ref 150–400)
RBC: 3.3 MIL/uL — ABNORMAL LOW (ref 3.87–5.11)
RDW: 13 % (ref 11.5–15.5)
WBC: 10.9 10*3/uL — AB (ref 4.0–10.5)

## 2015-06-01 LAB — BASIC METABOLIC PANEL
ANION GAP: 8 (ref 5–15)
BUN: 51 mg/dL — ABNORMAL HIGH (ref 6–20)
CALCIUM: 8.2 mg/dL — AB (ref 8.9–10.3)
CO2: 22 mmol/L (ref 22–32)
Chloride: 109 mmol/L (ref 101–111)
Creatinine, Ser: 1.98 mg/dL — ABNORMAL HIGH (ref 0.44–1.00)
GFR, EST AFRICAN AMERICAN: 27 mL/min — AB (ref >60)
GFR, EST NON AFRICAN AMERICAN: 23 mL/min — AB (ref >60)
GLUCOSE: 157 mg/dL — AB (ref 65–99)
Potassium: 4.8 mmol/L (ref 3.5–5.1)
SODIUM: 139 mmol/L (ref 135–145)

## 2015-06-01 LAB — MRSA PCR SCREENING: MRSA by PCR: NEGATIVE

## 2015-06-01 LAB — IRON AND TIBC
Iron: 19 ug/dL — ABNORMAL LOW (ref 28–170)
SATURATION RATIOS: 8 % — AB (ref 10.4–31.8)
TIBC: 235 ug/dL — ABNORMAL LOW (ref 250–450)
UIBC: 216 ug/dL

## 2015-06-01 LAB — URINE CULTURE: Culture: NO GROWTH

## 2015-06-01 LAB — FERRITIN: Ferritin: 48 ng/mL (ref 11–307)

## 2015-06-01 LAB — VITAMIN B12: Vitamin B-12: 248 pg/mL (ref 180–914)

## 2015-06-01 MED ORDER — INSULIN DETEMIR 100 UNIT/ML ~~LOC~~ SOLN
30.0000 [IU] | Freq: Every day | SUBCUTANEOUS | Status: DC
  Filled 2015-06-01: qty 0.3

## 2015-06-01 MED ORDER — VANCOMYCIN HCL IN DEXTROSE 750-5 MG/150ML-% IV SOLN
750.0000 mg | INTRAVENOUS | Status: DC
  Administered 2015-06-01: 750 mg via INTRAVENOUS
  Filled 2015-06-01 (×2): qty 150

## 2015-06-01 MED ORDER — CYCLOBENZAPRINE HCL 5 MG PO TABS
5.0000 mg | ORAL_TABLET | Freq: Once | ORAL | Status: AC
  Administered 2015-06-01: 5 mg via ORAL
  Filled 2015-06-01: qty 1

## 2015-06-01 MED ORDER — ACETAMINOPHEN 325 MG PO TABS
650.0000 mg | ORAL_TABLET | Freq: Four times a day (QID) | ORAL | Status: DC | PRN
Start: 2015-06-01 — End: 2015-06-07
  Administered 2015-06-03 – 2015-06-05 (×3): 650 mg via ORAL
  Filled 2015-06-01 (×3): qty 2

## 2015-06-01 NOTE — Progress Notes (Signed)
cbgs low. Adjusted insulin.  Doree Barthel, MD

## 2015-06-01 NOTE — Progress Notes (Signed)
Pharmacy Antibiotic Follow-up Note  [name] is a 81 y.o.  female admitted on 05/31/2015.  The patient is currently on day #2 of abx for sepsis.  Assessment/Plan: Day #2 of abx for presumed UTI. Aferbile, WBC elevated at 10.9. Has had enterococcus in the past. Allergic to PCN. SCr 1.98, CrCl ~69ml/min. Pharmacy consulted to continue vancomycin for possible enterococcus UTI.  Plan: Start vancomycin 750mg  IV Q24 Continue cefepime 1g IV q24h Monitor clinical picture, renal function F/U C&S, abx deescalation / LOT   Temp (24hrs), Avg:98.7 F (37.1 C), Min:98.5 F (36.9 C), Max:99 F (37.2 C)   Recent Labs Lab 05/31/15 1653 06/01/15 0628  WBC 10.3 10.9*    Recent Labs Lab 05/31/15 1653 05/31/15 1704 06/01/15 0628  CREATININE 2.55* 2.40* 1.98*   Estimated Creatinine Clearance: 24 mL/min (by C-G formula based on Cr of 1.98).    Allergies  Allergen Reactions  . Ace Inhibitors Other (See Comments)    Listed on MAR  . Codeine Other (See Comments)    Listed on MAR  . Lisinopril Other (See Comments)    Listed on MAR  . Penicillins Hives    Listed on MAR - no other information available  . Trimethoprim Other (See Comments)    Listed on MAR  . Septra [Sulfamethoxazole-Trimethoprim] Hives and Rash  . Sulfur Hives and Rash    Antimicrobials this admission: Cefepime 1/15 >> Vancomycin 1/15 >>  Microbiology results: Blood cx 1/15 > sent Urine cx 1/15 > sent  Thank you for allowing pharmacy to be a part of this patient's care.  Elenor Quinones, PharmD, BCPS Clinical Pharmacist Pager [phone] 06/01/2015 11:09 AM

## 2015-06-01 NOTE — Progress Notes (Signed)
Hypoglycemic Event  CBG: 44  Treatment: 15 GM carbohydrate snack  Symptoms: Sweaty and Shaky  Follow-up CBG: Time:1732 CBG Result:74  Possible Reasons for Event: Unknown  Comments/MD notified:Dr. Fletcher Anon, Deandrew Hoecker B

## 2015-06-01 NOTE — Progress Notes (Signed)
[facility] PROGRESS NOTE  [name] [id number] DOB: [birth year] DOA: 05/31/2015 PCP: Mayra Neer, MD  Summary 81 y.o. female with h/o recurrent UTIs, DM2, HTN, patient presents to the ED with fever and lethargy that has been ongoing since Friday. Fevers up to 102 at NH, has urinary "pressure" like sensation. Today BP was low in the 70s at the NH so EMS was called  Assessment/Plan:  Principal Problem:   UTI (lower urinary tract infection): on cefepime. Previous Urine cultures x2 with enterococcus. Pt is PCN allergic. Will cover with vancomycin pending culture results. BP stabilized. Active Problems:   Diabetes mellitus without complication ([place]): controlled on current regimen   Anemia: no evidence of bleeding. Will check anemia panel   Essential hypertension: antihypertensives held due to hypotension on admission   Chronic diastolic HF. Lasix held getting IVF due to hypotension, infection, prerenal azotemia.   Acute on chronic renal failure ([place]): improving. Baseline creat 1.7-1.9   Chronic kidney disease (CKD), stage III - IV (severe) (H[facility])   Neck muscle spasm: tylenol prn, kpad. Flexeril x1 Abnormal LFTs: possibly secondary to hypotension, infection. Recheck in am  Code Status:  DNR Family Communication:   Disposition Plan:  SNF when urine cx back if clinically stable  Consultants:    Procedures:     Antibiotics:  Cefepime  vancomycin  HPI/Subjective: C/o left neck spasm. No dyspnea.  Objective: Filed Vitals:   05/31/15 2150 06/01/15 0520  BP: 141/32 137/57  Pulse: 64 60  Temp: 98.5 F (36.9 C) 98.6 F (37 C)  Resp: 19 17    Intake/Output Summary (Last 24 hours) at 06/01/15 1111 Last data filed at 06/01/15 0859  Gross per 24 hour  Intake 2781.25 ml  Output    350 ml  Net 2431.25 ml   Filed Weights   05/31/15 1743 05/31/15 2150  Weight: 80.74 kg (178 lb) 83.28 kg (183 lb 9.6 oz)    Exam:   General:  A and o  Cardiovascular:  RRR without MGR  Respiratory: CTA without WRR  Abdomen: obese. S, nt, nd  Ext: no CCE  Basic Metabolic Panel:  Recent Labs Lab 05/31/15 1653 05/31/15 1704 06/01/15 0628  NA 139 139 139  K 4.9 4.9 4.8  CL 103 104 109  CO2 25  --  22  GLUCOSE 91 85 157*  BUN 61* 62* 51*  CREATININE 2.55* 2.40* 1.98*  CALCIUM 8.9  --  8.2*   Liver Function Tests:  Recent Labs Lab 05/31/15 1653  AST 188*  ALT 130*  ALKPHOS 131*  BILITOT 0.3  PROT 6.6  ALBUMIN 2.9*   No results for input(s): LIPASE, AMYLASE in the last 168 hours. No results for input(s): AMMONIA in the last 168 hours. CBC:  Recent Labs Lab 05/31/15 1653 05/31/15 1704 06/01/15 0628  WBC 10.3  --  10.9*  NEUTROABS 5.7  --   --   HGB 10.0* 10.9* 9.2*  HCT 32.4* 32.0* 30.4*  MCV 90.5  --  92.1  PLT 225  --  189   Cardiac Enzymes: No results for input(s): CKTOTAL, CKMB, CKMBINDEX, TROPONINI in the last 168 hours. BNP (last 3 results)  Recent Labs  08/10/14 0608 10/03/14 1217 10/10/14 0314  BNP 170.7* 323.1* 479.9*    ProBNP (last 3 results) No results for input(s): PROBNP in the last 8760 hours.  CBG:  Recent Labs Lab 05/31/15 2150 06/01/15 0806  GLUCAP 105* 144*    No results found for this or any previous visit (  from the past 240 hour(s)).   Studies: Dg Chest Portable 1 View  05/31/2015  CLINICAL DATA:  Fever, weakness and lethargy beginning Friday, fever to 102 degrees, frequent UTIs, history hypertension, type II diabetes mellitus, COPD, smoker EXAM: PORTABLE CHEST 1 VIEW COMPARISON:  Portable exam 1656 hours compared to 10/09/2014 FINDINGS: Rotation to the RIGHT. Upper normal heart size. Mediastinal contours and pulmonary vascularity normal. Bronchitic changes with resolution of pulmonary edema versus previous exam. No acute infiltrate, pleural effusion or pneumothorax. Prior cervical spine fusion. Bones demineralized. Atherosclerotic calcification aorta. IMPRESSION: Bronchitic changes.  Resolution of pulmonary edema seen on previous exam. No acute abnormalities. Electronically Signed   By: Lavonia Dana M.D.   On: 05/31/2015 17:09    Scheduled Meds: . amitriptyline  25 mg Oral QHS  . aspirin  81 mg Oral Daily  . ceFEPime (MAXIPIME) IV  1 g Intravenous Q24H  . docusate sodium  100 mg Oral BID  . escitalopram  20 mg Oral Daily  . gabapentin  200 mg Oral BID  . heparin  5,000 Units Subcutaneous 3 times per day  . insulin aspart  0-15 Units Subcutaneous TID WC  . insulin aspart  5 Units Subcutaneous TID WC  . insulin detemir  42 Units Subcutaneous Daily  . levothyroxine  50 mcg Oral QAC breakfast  . multivitamin-lutein  2 capsule Oral Daily  . pantoprazole  40 mg Oral Daily  . rosuvastatin  20 mg Oral QPM  . vancomycin  750 mg Intravenous Q24H   Continuous Infusions: . sodium chloride 75 mL/hr at 06/01/15 1046    Time spent: 35 minutes  [facility] [email or web address], password [facility] 06/01/2015, 11:11 AM  LOS: 1 day

## 2015-06-02 LAB — CBC
HCT: 32 % — ABNORMAL LOW (ref 36.0–46.0)
HEMOGLOBIN: 9.7 g/dL — AB (ref 12.0–15.0)
MCH: 27.7 pg (ref 26.0–34.0)
MCHC: 30.3 g/dL (ref 30.0–36.0)
MCV: 91.4 fL (ref 78.0–100.0)
Platelets: 232 10*3/uL (ref 150–400)
RBC: 3.5 MIL/uL — ABNORMAL LOW (ref 3.87–5.11)
RDW: 13.1 % (ref 11.5–15.5)
WBC: 8 10*3/uL (ref 4.0–10.5)

## 2015-06-02 LAB — COMPREHENSIVE METABOLIC PANEL
ALK PHOS: 136 U/L — AB (ref 38–126)
ALT: 72 U/L — AB (ref 14–54)
AST: 45 U/L — AB (ref 15–41)
Albumin: 2.6 g/dL — ABNORMAL LOW (ref 3.5–5.0)
Anion gap: 11 (ref 5–15)
BUN: 36 mg/dL — AB (ref 6–20)
CALCIUM: 8.7 mg/dL — AB (ref 8.9–10.3)
CHLORIDE: 107 mmol/L (ref 101–111)
CO2: 22 mmol/L (ref 22–32)
CREATININE: 1.68 mg/dL — AB (ref 0.44–1.00)
GFR, EST AFRICAN AMERICAN: 33 mL/min — AB (ref >60)
GFR, EST NON AFRICAN AMERICAN: 28 mL/min — AB (ref >60)
Glucose, Bld: 260 mg/dL — ABNORMAL HIGH (ref 65–99)
Potassium: 4.4 mmol/L (ref 3.5–5.1)
Sodium: 140 mmol/L (ref 135–145)
TOTAL PROTEIN: 6 g/dL — AB (ref 6.5–8.1)

## 2015-06-02 LAB — GLUCOSE, CAPILLARY
GLUCOSE-CAPILLARY: 248 mg/dL — AB (ref 65–99)
GLUCOSE-CAPILLARY: 237 mg/dL — AB (ref 65–99)
Glucose-Capillary: 288 mg/dL — ABNORMAL HIGH (ref 65–99)
Glucose-Capillary: 159 mg/dL — ABNORMAL HIGH (ref 65–99)

## 2015-06-02 MED ORDER — DOCUSATE SODIUM 100 MG PO CAPS
100.0000 mg | ORAL_CAPSULE | Freq: Two times a day (BID) | ORAL | Status: DC
Start: 2015-06-02 — End: 2015-06-02

## 2015-06-02 MED ORDER — ESCITALOPRAM OXALATE 20 MG PO TABS: 20.0000 mg | ORAL_TABLET | Freq: Every day | ORAL | Status: DC

## 2015-06-02 MED ORDER — CARVEDILOL 3.125 MG PO TABS
3.1250 mg | ORAL_TABLET | Freq: Two times a day (BID) | ORAL | Status: DC
  Administered 2015-06-02 – 2015-06-04 (×5): 3.125 mg via ORAL
  Filled 2015-06-02 (×5): qty 1

## 2015-06-02 MED ORDER — INSULIN ASPART 100 UNIT/ML ~~LOC~~ SOLN
4.0000 [IU] | Freq: Three times a day (TID) | SUBCUTANEOUS | Status: DC
  Administered 2015-06-02 – 2015-06-05 (×8): 4 [IU] via SUBCUTANEOUS

## 2015-06-02 MED ORDER — CEFUROXIME AXETIL 500 MG PO TABS
500.0000 mg | ORAL_TABLET | Freq: Two times a day (BID) | ORAL | Status: DC
  Administered 2015-06-02 – 2015-06-04 (×4): 500 mg via ORAL
  Filled 2015-06-02 (×7): qty 1

## 2015-06-02 MED ORDER — DARBEPOETIN ALFA 40 MCG/0.4ML IJ SOSY
40.0000 ug | PREFILLED_SYRINGE | INTRAMUSCULAR | Status: DC
  Administered 2015-06-02: 40 ug via SUBCUTANEOUS
  Filled 2015-06-02: qty 0.4

## 2015-06-02 MED ORDER — AMITRIPTYLINE HCL 25 MG PO TABS: 25.0000 mg | ORAL_TABLET | Freq: Every day | ORAL | Status: DC

## 2015-06-02 MED ORDER — INSULIN DETEMIR 100 UNIT/ML ~~LOC~~ SOLN
40.0000 [IU] | Freq: Every day | SUBCUTANEOUS | Status: DC
  Administered 2015-06-03 – 2015-06-07 (×5): 40 [IU] via SUBCUTANEOUS
  Filled 2015-06-02 (×5): qty 0.4

## 2015-06-02 MED ORDER — ASPIRIN 81 MG PO CHEW: 81.0000 mg | CHEWABLE_TABLET | Freq: Every day | ORAL | Status: DC

## 2015-06-02 MED ORDER — FERUMOXYTOL INJECTION 510 MG/17 ML
510.0000 mg | Freq: Once | INTRAVENOUS | Status: AC
  Administered 2015-06-02: 510 mg via INTRAVENOUS
  Filled 2015-06-02: qty 17

## 2015-06-02 MED ORDER — FUROSEMIDE 20 MG PO TABS
20.0000 mg | ORAL_TABLET | Freq: Every day | ORAL | Status: DC
  Administered 2015-06-02 – 2015-06-07 (×6): 20 mg via ORAL
  Filled 2015-06-02 (×6): qty 1

## 2015-06-02 NOTE — Progress Notes (Signed)
[facility] PROGRESS NOTE  [name] [id number] DOB: 11/07/1934 DOA: 05/31/2015 PCP: Mayra Neer, MD  Summary 81 y.o. female with h/o recurrent UTIs, DM2, HTN, patient presents to the ED with fever and lethargy that has been ongoing since Friday. Fevers up to 102 at NH, has urinary "pressure" like sensation. Today BP was low in the 70s at the NH so EMS was called  Assessment/Plan:  Principal Problem:   UTI (lower urinary tract infection): cx negative. Will change to ceftin and treat 5 days total Active Problems:   Diabetes mellitus without complication ([place]): CBGs labile. No lows today. Will increase lantus   Anemia: anemia panel consistent with chronic disease possible iron deficiency component. Will give IV iron and ESA   Essential hypertension: antihypertensives held due to hypotension on admission. Resume carvedilol at lower dose   Chronic diastolic HF. Lasix held getting IVF due to hypotension, infection, prerenal azotemia. Appears euvolemic   Acute on chronic renal failure ([place]): improving. Baseline creat 1.7-1.9. Resume lasix at lower dose   Chronic kidney disease (CKD), stage III - IV (severe) (HCC)   Neck muscle spasm: resolved Abnormal LFTs: hypotension related, improving  Code Status:  DNR Family Communication:  Pt is lucid Disposition Plan:  SNF 1-2 days if stable  Consultants:    Procedures:     Antibiotics:  Cefepime  vancomycin  HPI/Subjective: C/o malaise, poor appetite, dysuria. Chronic dyspnea.  Objective: Filed Vitals:   06/01/15 2204 06/02/15 0630  BP: 115/78 158/45  Pulse: 65 87  Temp: 100 F (37.8 C) 99 F (37.2 C)  Resp: 18 18    Intake/Output Summary (Last 24 hours) at 06/02/15 1309 Last data filed at 06/01/15 2300  Gross per 24 hour  Intake 1804.67 ml  Output      0 ml  Net 1804.67 ml   Filed Weights   05/31/15 1743 05/31/15 2150  Weight: 80.74 kg (178 lb) 83.28 kg (183 lb 9.6 oz)    Exam:   General:   A and o in chair. Breathing nonlabored  Cardiovascular: RRR without MGR  Respiratory: CTA without WRR  Abdomen: obese. S, nt, nd  Ext: no CCE  Basic Metabolic Panel:  Recent Labs Lab 05/31/15 1653 05/31/15 1704 06/01/15 0628 06/02/15 0457  NA 139 139 139 140  K 4.9 4.9 4.8 4.4  CL 103 104 109 107  CO2 25  --  22 22  GLUCOSE 91 85 157* 260*  BUN 61* 62* 51* 36*  CREATININE 2.55* 2.40* 1.98* 1.68*  CALCIUM 8.9  --  8.2* 8.7*   Liver Function Tests:  Recent Labs Lab 05/31/15 1653 06/02/15 0457  AST 188* 45*  ALT 130* 72*  ALKPHOS 131* 136*  BILITOT 0.3 <0.1*  PROT 6.6 6.0*  ALBUMIN 2.9* 2.6*   No results for input(s): LIPASE, AMYLASE in the last 168 hours. No results for input(s): AMMONIA in the last 168 hours. CBC:  Recent Labs Lab 05/31/15 1653 05/31/15 1704 06/01/15 0628 06/02/15 0457  WBC 10.3  --  10.9* 8.0  NEUTROABS 5.7  --   --   --   HGB 10.0* 10.9* 9.2* 9.7*  HCT 32.4* 32.0* 30.4* 32.0*  MCV 90.5  --  92.1 91.4  PLT 225  --  189 232   Cardiac Enzymes: No results for input(s): CKTOTAL, CKMB, CKMBINDEX, TROPONINI in the last 168 hours. BNP (last 3 results)  Recent Labs  08/10/14 0608 10/03/14 1217 10/10/14 0314  BNP 170.7* 323.1* 479.9*    ProBNP (  last 3 results) No results for input(s): PROBNP in the last 8760 hours.  CBG:  Recent Labs Lab 06/01/15 1714 06/01/15 1732 06/01/15 2202 06/02/15 0725 06/02/15 1235  GLUCAP 44* 74 196* 248* 237*    Recent Results (from the past 240 hour(s))  Blood culture (routine x 2)     Status: None (Preliminary result)   Collection Time: 05/31/15  4:53 PM  Result Value Ref Range Status   Specimen Description BLOOD LEFT ARM  Final   Special Requests BOTTLES DRAWN AEROBIC ONLY 5CC  Final   Culture NO GROWTH 2 DAYS  Final   Report Status PENDING  Incomplete  Blood culture (routine x 2)     Status: None (Preliminary result)   Collection Time: 05/31/15  5:05 PM  Result Value Ref Range Status    Specimen Description BLOOD RIGHT HAND  Final   Special Requests BOTTLES DRAWN AEROBIC ONLY 5CC  Final   Culture NO GROWTH 2 DAYS  Final   Report Status PENDING  Incomplete  Urine culture     Status: None   Collection Time: 05/31/15  5:43 PM  Result Value Ref Range Status   Specimen Description URINE, CATHETERIZED  Final   Special Requests NONE  Final   Culture NO GROWTH 1 DAY  Final   Report Status 06/01/2015 FINAL  Final  MRSA PCR Screening     Status: None   Collection Time: 06/01/15  1:10 PM  Result Value Ref Range Status   MRSA by PCR NEGATIVE NEGATIVE Final    Comment:        The GeneXpert MRSA Assay (FDA approved for NASAL specimens only), is one component of a comprehensive MRSA colonization surveillance program. It is not intended to diagnose MRSA infection nor to guide or monitor treatment for MRSA infections.      Studies: Dg Chest Portable 1 View  05/31/2015  CLINICAL DATA:  Fever, weakness and lethargy beginning Friday, fever to 102 degrees, frequent UTIs, history hypertension, type II diabetes mellitus, COPD, smoker EXAM: PORTABLE CHEST 1 VIEW COMPARISON:  Portable exam 1656 hours compared to 10/09/2014 FINDINGS: Rotation to the RIGHT. Upper normal heart size. Mediastinal contours and pulmonary vascularity normal. Bronchitic changes with resolution of pulmonary edema versus previous exam. No acute infiltrate, pleural effusion or pneumothorax. Prior cervical spine fusion. Bones demineralized. Atherosclerotic calcification aorta. IMPRESSION: Bronchitic changes. Resolution of pulmonary edema seen on previous exam. No acute abnormalities. Electronically Signed   By: Lavonia Dana M.D.   On: 05/31/2015 17:09    Scheduled Meds: . amitriptyline  25 mg Oral QHS  . aspirin  81 mg Oral Daily  . ceFEPime (MAXIPIME) IV  1 g Intravenous Q24H  . docusate sodium  100 mg Oral BID  . escitalopram  20 mg Oral Daily  . gabapentin  200 mg Oral BID  . heparin  5,000 Units  Subcutaneous 3 times per day  . insulin aspart  0-15 Units Subcutaneous TID WC  . insulin detemir  30 Units Subcutaneous Daily  . levothyroxine  50 mcg Oral QAC breakfast  . multivitamin-lutein  2 capsule Oral Daily  . pantoprazole  40 mg Oral Daily  . rosuvastatin  20 mg Oral QPM   Continuous Infusions:    Time spent: 25 minutes  [facility] [email or web address], password [facility] 06/02/2015, 1:09 PM  LOS: 2 days

## 2015-06-02 NOTE — Evaluation (Signed)
Physical Therapy Evaluation Patient Details Name: [name] MRN: [id number] DOB: 1934-07-09 Today's Date: 06/02/2015   History of Present Illness  Pt is a 81 y.o. female with a UTI. Pt has other significant PMH of HTN, macular degeneration, type II DM, COPD, peripheral neuropathy, PAD, CKD, TIA, tobacco use, and altered mental status.  Clinical Impression  Pt was willing to work in treatment, but demonstrated confusion to her situation. For example, Pt was confused because she had lost her "big blue salad bowl" while transferring to EOB. With +2 max assist, Pt was able to get EOB. With +2 max assist, Pt was able to transfer to [facility]. Pt came from SNF, and upon d/c plans to return there.  PT to follow acutely for deficits listed below.       Follow Up Recommendations SNF    Equipment Recommendations  None recommended by PT    Recommendations for Other Services       Precautions / Restrictions Precautions Precautions: None Restrictions Weight Bearing Restrictions: No      Mobility  Bed Mobility Overal bed mobility: +2 for physical assistance;Needs Assistance Bed Mobility: Supine to Sit     Supine to sit: Max assist;+2 for physical assistance     General bed mobility comments: Pt is able to get to EOB with +2 max assist. Pt requires one person to help pull her legs to EOB and another person to support her from behind as she tends to lean posteriorly and to her right.   Transfers   Equipment used: Rolling walker (2 wheeled) Transfers: Sit to/from Stand Sit to Stand: +2 physical assistance;Max assist         General transfer comment: Pt is able to get EOB with +2 max assist. Pt is aware and able to follow one step commands in the transfers. Pt reports feeling very weak.         Balance Overall balance assessment: Needs assistance Sitting-balance support: Bilateral upper extremity supported;Feet supported Sitting balance-Leahy Scale: Zero Sitting balance -  Comments: Pt requires posterior support in order to maintain upright sitting EOB. Pt leans to her right, which also throws her off balance in sitting.  Postural control: Right lateral lean;Posterior lean Standing balance support: Bilateral upper extremity supported Standing balance-Leahy Scale: Zero Standing balance comment: Pt is able to stand with +2 max assist. Pt's legs give away due to weakness. Pt holds on to RW with bilateral UE. Pt requires cues to stand upright and to look forward.                             Pertinent Vitals/Pain Pain Assessment: 0-10 Pain Score: 6  Pain Location: bottom of stomach  Pain Descriptors / Indicators: Aching Pain Intervention(s): Repositioned;Monitored during session;Limited activity within patient's tolerance    Home Living Family/patient expects to be discharged to:: Skilled nursing facility                      Prior Function Level of [place]: Needs assistance   Gait / Transfers Assistance Needed: Pt reports she has RW with 4 wheels, but uses her wheelchair more than her walker in the SNF. Pt reports she leaves SNF when children come to get her. Pt reports it often takes 2 people to transfer her.           Hand Dominance   Dominant Hand: Right       Communication  Communication: No difficulties  Cognition Arousal/Alertness: Awake/alert Behavior During Therapy: WFL for tasks assessed/performed Overall Cognitive Status: Impaired/Different from baseline Area of Impairment: Awareness;Orientation;Safety/judgement Orientation Level: Situation       Safety/Judgement: Decreased awareness of safety;Decreased awareness of deficits Awareness: Intellectual   General Comments: Once tx was over, Pt was in chair and asked if she thought she would be "able to get back into the bed on her own". Pt responded she could demonstrating a lack of understanding of her ability.    General Comments General comments (skin  integrity, edema, etc.): Pt seems to be somewhat confused and disoriented. Pt asked for her "blue salad bowl" when transferring her to EOB.          Assessment/Plan    PT Assessment Patient needs continued PT services  PT Diagnosis Generalized weakness;Acute pain   PT Problem List Decreased strength;Decreased activity tolerance;Decreased balance;Decreased mobility;Decreased knowledge of use of DME;Decreased safety awareness;Pain  PT Treatment Interventions DME instruction;Functional mobility training;Therapeutic activities;Therapeutic exercise;Balance training;Modalities   PT Goals (Current goals can be found in the Care Plan section) Acute Rehab PT Goals Patient Stated Goal: no goal stated PT Goal Formulation: With patient Time For Goal Achievement: 06/16/15 Potential to Achieve Goals: Fair    Frequency Min 2X/week    End of Session Equipment Utilized During Treatment: Gait belt Activity Tolerance: Patient limited by fatigue Patient left: in chair;with chair alarm set;with call bell/phone within reach Nurse Communication: Mobility status         Time: [id number] PT Time Calculation (min) (ACUTE ONLY): 34 min   Charges: 1 mod eval, 1 therapeutic activity           [place], [place] office  Arelia Sneddon 06/02/2015, 1:38 PM

## 2015-06-02 NOTE — Consult Note (Signed)
PHARMACY NOTE   Pharmacy Consult for :  Darbepoetin Indication:  ACD  Dosing Weight: 83 kg  Labs:  Recent Labs  05/31/15 1653 05/31/15 1704 06/01/15 0628 06/02/15 0457  HGB 10.0* 10.9* 9.2* 9.7*  HCT 32.4* 32.0* 30.4* 32.0*  PLT 225  --  189 232  CREATININE 2.55* 2.40* 1.98* 1.68*   - Anemia: anemia panel consistent with chronic disease possible iron deficiency component.   Estimated Creatinine Clearance: 28.2 mL/min (by C-G formula based on Cr of 1.68).  Current Medication[s] Include: Scheduled:  Scheduled:  . amitriptyline  25 mg Oral QHS  . aspirin  81 mg Oral Daily  . carvedilol  3.125 mg Oral BID WC  . cefUROXime  500 mg Oral BID WC  . docusate sodium  100 mg Oral BID  . escitalopram  20 mg Oral Daily  . ferumoxytol  510 mg Intravenous Once  . furosemide  20 mg Oral Daily  . gabapentin  200 mg Oral BID  . heparin  5,000 Units Subcutaneous 3 times per day  . insulin aspart  0-15 Units Subcutaneous TID WC  . insulin aspart  4 Units Subcutaneous TID WC  . [START ON 06/03/2015] insulin detemir  40 Units Subcutaneous Daily  . levothyroxine  50 mcg Oral QAC breakfast  . multivitamin-lutein  2 capsule Oral Daily  . pantoprazole  40 mg Oral Daily  . rosuvastatin  20 mg Oral QPM   Infusion[s]: Infusions:   Antibiotic[s]: Anti-infectives    Start     Dose/Rate Route Frequency Ordered Stop   06/02/15 1400  cefUROXime (CEFTIN) tablet 500 mg    Comments:  Aware of PCN allergy   500 mg Oral 2 times daily with meals 06/02/15 1327 06/06/15 0759   06/01/15 1700  ceFEPIme (MAXIPIME) 1 g in dextrose 5 % 50 mL IVPB  Status:  Discontinued     1 g 100 mL/hr over 30 Minutes Intravenous Every 24 hours 05/31/15 2350 06/02/15 1320   06/01/15 1700  vancomycin (VANCOCIN) IVPB 750 mg/150 ml premix  Status:  Discontinued     750 mg 150 mL/hr over 60 Minutes Intravenous Every 24 hours 06/01/15 1111 06/02/15 1008   05/31/15 1730  vancomycin (VANCOCIN) 1,500 mg in sodium chloride 0.9  % 500 mL IVPB     1,500 mg 250 mL/hr over 120 Minutes Intravenous  Once 05/31/15 1646 05/31/15 2010   05/31/15 1700  ceFEPIme (MAXIPIME) 2 g in dextrose 5 % 50 mL IVPB     2 g 100 mL/hr over 30 Minutes Intravenous STAT 05/31/15 1649 05/31/15 1835     Assessment:  81 y/o female with ACD, low iron, TIBC, iron Saturation who will be given Feraheme + Darbepoetin.  Pharmacy asked to dose Darbepoetin per protocol.  Goal:  Hgb consistently > 11 gm/dl.   Plan: 1. Feraheme as previously ordered. 2. Begin Aranesp 40 mcg sq weekly x 4.   Marthenia Rolling,  Pharm.D   06/02/2015,  5:56 PM

## 2015-06-02 NOTE — Care Management Note (Signed)
Case Management Note  Patient Details  Name: [name] MRN: [id number] Date of Birth: [birth year]  Subjective/Objective:                    Action/Plan:  Initial UR completed  Expected Discharge Date:  06/04/15               Expected Discharge Plan:  Skilled Nursing Facility  In-House Referral:  Clinical Social Work  Discharge planning Services     Post Acute Care Choice:    Choice offered to:     DME Arranged:    DME Agency:     HH Arranged:    [place] Agency:     Status of Service:  In process, will continue to follow  Medicare Important Message Given:    Date Medicare IM Given:    Medicare IM give by:    Date Additional Medicare IM Given:    Additional Medicare Important Message give by:     If discussed at [place] of Stay Meetings, dates discussed:    Additional Comments:  Marilu Favre, RN 06/02/2015, 10:52 AM

## 2015-06-03 DIAGNOSIS — D631 Anemia in chronic kidney disease: Secondary | ICD-10-CM

## 2015-06-03 DIAGNOSIS — N184 Chronic kidney disease, stage 4 (severe): Secondary | ICD-10-CM

## 2015-06-03 LAB — GLUCOSE, CAPILLARY
GLUCOSE-CAPILLARY: 113 mg/dL — AB (ref 65–99)
Glucose-Capillary: 219 mg/dL — ABNORMAL HIGH (ref 65–99)
Glucose-Capillary: 182 mg/dL — ABNORMAL HIGH (ref 65–99)
Glucose-Capillary: 76 mg/dL (ref 65–99)

## 2015-06-03 LAB — COMPREHENSIVE METABOLIC PANEL
ALBUMIN: 2.7 g/dL — AB (ref 3.5–5.0)
ALK PHOS: 123 U/L (ref 38–126)
ALT: 51 U/L (ref 14–54)
AST: 25 U/L (ref 15–41)
Anion gap: 12 (ref 5–15)
BUN: 22 mg/dL — ABNORMAL HIGH (ref 6–20)
CALCIUM: 9.3 mg/dL (ref 8.9–10.3)
CO2: 24 mmol/L (ref 22–32)
CREATININE: 1.52 mg/dL — AB (ref 0.44–1.00)
Chloride: 108 mmol/L (ref 101–111)
GFR calc non Af Amer: 32 mL/min — ABNORMAL LOW (ref >60)
GFR, EST AFRICAN AMERICAN: 37 mL/min — AB (ref >60)
GLUCOSE: 203 mg/dL — AB (ref 65–99)
Potassium: 4.2 mmol/L (ref 3.5–5.1)
Sodium: 144 mmol/L (ref 135–145)
Total Bilirubin: 0.5 mg/dL (ref 0.3–1.2)
Total Protein: 6.5 g/dL (ref 6.5–8.1)

## 2015-06-03 LAB — URINALYSIS, ROUTINE W REFLEX MICROSCOPIC
BILIRUBIN URINE: NEGATIVE
GLUCOSE, UA: NEGATIVE mg/dL
KETONES UR: NEGATIVE mg/dL
Nitrite: NEGATIVE
PH: 5.5 (ref 5.0–8.0)
Protein, ur: 100 mg/dL — AB
Specific Gravity, Urine: 1.011 (ref 1.005–1.030)

## 2015-06-03 LAB — URINE MICROSCOPIC-ADD ON

## 2015-06-03 NOTE — Progress Notes (Signed)
Pt a/c, had temp 100.1 and BP 181/53 @ 2117, PRN Tylenol given at 2118 temp at 2344 98.4 and BP 149/69, pt resting comfortably in bed

## 2015-06-03 NOTE — NC FL2 (Signed)
[place] LEVEL OF CARE SCREENING TOOL     IDENTIFICATION  Patient Name: [name] Birthdate: Apr 23, 1935 Sex: female Admission Date (Current Location): 05/31/2015  [facility] and [place] Number:  Herbalist and Address:  [facility], [place] [address] [place], [place] [address]      Provider Number: [id number]  Attending Physician Name and Address:  Verlee Monte, MD  Relative Name and Phone Number:       Current Level of Care: Hospital Recommended Level of Care: [employer] Prior Approval Number:    Date Approved/Denied:   PASRR Number:    Discharge Plan: SNF    Current Diagnoses: Patient Active Problem List   Diagnosis Date Noted  . Chronic kidney disease (CKD), stage III - IV (severe) ([place]) 06/01/2015  . Neck muscle spasm 06/01/2015  . CHF exacerbation ([place]) 10/09/2014  . Near syncope 10/05/2014  . Acute on chronic renal failure ([place]) 10/05/2014  . Tremor of both hands 10/05/2014  . Bradycardia 10/05/2014  . Wax in ear 10/05/2014  . Left ankle strain 08/09/2014  . Protein-calorie malnutrition, severe ([place]) 10/13/2013  . UTI (urinary tract infection) 10/11/2013  . Nausea, vomiting and abdominal pain. 10/11/2013  . Nausea and vomiting 10/11/2013  . TIA (transient ischemic attack) 10/07/2013  . Ulcer of left heel ([place]) 10/07/2013  . Altered mental status 06/20/2013  . Acute encephalopathy 06/20/2013  . Hypoglycemia 06/20/2013  . Renal failure (ARF), acute on chronic (HCC) 06/20/2013  . UTI (lower urinary tract infection) 06/20/2013  . ARF (acute renal failure) ([place]) 10/31/2012  . Hyperkalemia 10/31/2012  . Weakness generalized 10/31/2012  . CKD (chronic kidney disease) stage 3, GFR 30-59 ml/min 10/31/2012  . Fall 10/31/2012  . Nasal laceration 10/31/2012  . Diastolic dysfunction, grade II 01/27/2012  . Acute exacerbation of chronic obstructive pulmonary disease (COPD) ([place]) 01/26/2012  . Hypoxia  01/25/2012  . Elevated serum creatinine 01/25/2012  . Pyelonephritis 01/23/2012  . Lumbago 01/23/2012  . Hypothyroidism 12/01/2008  . Diabetes mellitus without complication ([place]) [id number]  . HYPERCHOLESTEROLEMIA 12/01/2008  . Anemia 12/01/2008  . Essential hypertension 12/01/2008  . CAD (coronary artery disease) 12/01/2008  . COPD (chronic obstructive pulmonary disease) ([place]) 12/01/2008  . WEAKNESS 12/01/2008  . EDEMA 12/01/2008  . SHORTNESS OF BREATH 12/01/2008  . DYSPHAGIA UNSPECIFIED 12/01/2008    Orientation RESPIRATION BLADDER Height & Weight    Self, Time, Situation, Place (memory impairment)  Normal Incontinent 5\' 3"  (160 cm) 183 lbs.  BEHAVIORAL SYMPTOMS/MOOD NEUROLOGICAL BOWEL NUTRITION STATUS      Continent Diet  AMBULATORY STATUS COMMUNICATION OF NEEDS Skin   Extensive Assist Verbally Normal                       Personal Care Assistance Level of Assistance  Bathing, Dressing Bathing Assistance: Maximum assistance   Dressing Assistance: Maximum assistance     Functional Limitations Info             SPECIAL CARE FACTORS FREQUENCY  PT (By licensed PT)                    Contractures      Additional Factors Info  Code Status, Allergies, Insulin Sliding Scale Code Status Info: DNR Allergies Info: Ace Inhibitors, Codeine, Lisinopril, Penicillins, Trimethoprim, Septra, Sulfur   Insulin Sliding Scale Info: 6/day       Current Medications (06/03/2015):  This is the current hospital active medication list  Current Facility-Administered Medications  Medication Dose Route Frequency Provider Last Rate Last Dose  . acetaminophen (TYLENOL) tablet 650 mg  650 mg Oral Q6H PRN Delfina Redwood, MD      . albuterol (PROVENTIL) (2.5 MG/3ML) 0.083% nebulizer solution 2.5 mg  2.5 mg Nebulization Q6H PRN Etta Quill, DO      . amitriptyline (ELAVIL) tablet 25 mg  25 mg Oral QHS Etta Quill, DO   25 mg at 06/02/15 2132  . aspirin chewable tablet  81 mg  81 mg Oral Daily Etta Quill, DO   81 mg at 06/03/15 [id number]  . carvedilol (COREG) tablet 3.125 mg  3.125 mg Oral BID WC Delfina Redwood, MD   3.125 mg at 06/03/15 0843  . cefUROXime (CEFTIN) tablet 500 mg  500 mg Oral BID WC Delfina Redwood, MD   500 mg at 06/03/15 0843  . Darbepoetin Alfa (ARANESP) injection 40 mcg  40 mcg Subcutaneous Q Tue-1800 Delfina Redwood, MD   40 mcg at 06/02/15 1854  . docusate sodium (COLACE) capsule 100 mg  100 mg Oral BID Etta Quill, DO   100 mg at 06/03/15 [id number]  . escitalopram (LEXAPRO) tablet 20 mg  20 mg Oral Daily Etta Quill, DO   20 mg at 06/03/15 [id number]  . furosemide (LASIX) tablet 20 mg  20 mg Oral Daily Delfina Redwood, MD   20 mg at 06/03/15 [id number]  . gabapentin (NEURONTIN) capsule 200 mg  200 mg Oral BID Etta Quill, DO   200 mg at 06/03/15 [id number]  . heparin injection 5,000 Units  5,000 Units Subcutaneous 3 times per day Etta Quill, DO   5,000 Units at 06/03/15 [id number]  . HYDROcodone-acetaminophen (NORCO/VICODIN) 5-325 MG per tablet 1-2 tablet  1-2 tablet Oral Q4H PRN Etta Quill, DO   2 tablet at 06/01/15 0040  . insulin aspart (novoLOG) injection 0-15 Units  0-15 Units Subcutaneous TID WC Etta Quill, DO   5 Units at 06/03/15 0845  . insulin aspart (novoLOG) injection 4 Units  4 Units Subcutaneous TID WC Delfina Redwood, MD   4 Units at 06/03/15 0845  . insulin detemir (LEVEMIR) injection 40 Units  40 Units Subcutaneous Daily Delfina Redwood, MD   40 Units at 06/03/15 [phone]  . levothyroxine (SYNTHROID, LEVOTHROID) tablet 50 mcg  50 mcg Oral QAC breakfast Etta Quill, DO   50 mcg at 06/03/15 0843  . multivitamin-lutein (OCUVITE-LUTEIN) capsule 2 capsule  2 capsule Oral Daily Etta Quill, DO   2 capsule at 06/03/15 [id number]  . pantoprazole (PROTONIX) EC tablet 40 mg  40 mg Oral Daily Etta Quill, DO   40 mg at 06/03/15 [id number]  . rosuvastatin (CRESTOR) tablet 20 mg  20 mg Oral QPM Etta Quill, DO   20 mg at  06/02/15 1752     Discharge Medications: Please see discharge summary for a list of discharge medications.  Relevant Imaging Results:  Relevant Lab Results:   Additional Information SS# [id number]  Cranford Mon, [place]

## 2015-06-03 NOTE — Care Management Important Message (Signed)
Important Message  Patient Details  Name: [name] MRN: [id number] Date of Birth: 12-09-1934   Medicare Important Message Given:  Yes    German Manke P Latrelle Bazar 06/03/2015, 2:32 PM

## 2015-06-03 NOTE — Progress Notes (Signed)
[facility] PROGRESS NOTE  [name] [id number] DOB: 26-Jun-1934 DOA: 05/31/2015 PCP: Mayra Neer, MD   Subjective:  She developed fever of 100.6 last night, low temp of 99.4 earlier today. She was on cefepime and was discontinued and switched to Ceftin yesterday. Hold on discharge to nursing home, recent the urine for analysis and culture.   Summary 81 y.o. female with h/o recurrent UTIs, DM2, HTN, patient presents to the ED with fever and lethargy that has been ongoing since Friday. Fevers up to 102 at NH, has urinary "pressure" like sensation. Today BP was low in the 70s at the NH so EMS was called  Assessment/Plan: Principal Problem:   UTI (lower urinary tract infection) Active Problems:   Diabetes mellitus without complication (HCC)   Anemia   Essential hypertension   Diastolic dysfunction, grade II   Acute on chronic renal failure (HCC)   Chronic kidney disease (CKD), stage III - IV (severe) (HCC)   Neck muscle spasm   UTI Patient presented with fever, urinalysis consistent with UTI. Patient started on cefepime on admission for UTI. Last previous 2 UTIs was enterococcus.  Switched to [place] on 06/02/2015, she developed fever, reculture urine on 1/18.  Diabetes mellitus type 2 without complications On carbohydrate modified diet, insulin sliding scale. Lantus increased, follow CBGs closely.  Anemia iron indices consistent with anemia of chronic disease with low iron. Patient given IV iron and Aranesp.  Hypertension IV hypertensive on hold because of hypotension on admission, Coreg restarted.  Chronic diastolic CHF Initially Lasix held and started on IV fluids. Restarted Lasix.  CKD stage III Creatinine at baseline at 1.52, follow renal function.  Neck muscle spasm This is resolved.  Transaminitis This is likely mild form of shocked liver secondary to hypotension, this is completely resolved. .  Code Status:  DNR Family Communication:   Pt is lucid Disposition Plan:  SNF 1-2 days if stable  Consultants:    Procedures:     Antibiotics:  Cefepime  vancomycin    Objective: Filed Vitals:   06/02/15 2137 06/03/15 0500  BP: 161/41 169/77  Pulse: 83 95  Temp: 100.6 F (38.1 C) 99.4 F (37.4 C)  Resp: 20 22    Intake/Output Summary (Last 24 hours) at 06/03/15 1316 Last data filed at 06/02/15 2030  Gross per 24 hour  Intake    357 ml  Output      0 ml  Net    357 ml   Filed Weights   05/31/15 1743 05/31/15 2150  Weight: 80.74 kg (178 lb) 83.28 kg (183 lb 9.6 oz)    Exam:   General:  A and o in chair. Breathing nonlabored  Cardiovascular: RRR without MGR  Respiratory: CTA without WRR  Abdomen: obese. S, nt, nd  Ext: no CCE  Basic Metabolic Panel:  Recent Labs Lab 05/31/15 1653 05/31/15 1704 06/01/15 0628 06/02/15 0457 06/03/15 0543  NA 139 139 139 140 144  K 4.9 4.9 4.8 4.4 4.2  CL 103 104 109 107 108  CO2 25  --  22 22 24   GLUCOSE 91 85 157* 260* 203*  BUN 61* 62* 51* 36* 22*  CREATININE 2.55* 2.40* 1.98* 1.68* 1.52*  CALCIUM 8.9  --  8.2* 8.7* 9.3   Liver Function Tests:  Recent Labs Lab 05/31/15 1653 06/02/15 0457 06/03/15 0543  AST 188* 45* 25  ALT 130* 72* 51  ALKPHOS 131* 136* 123  BILITOT 0.3 <0.1* 0.5  PROT 6.6 6.0* 6.5  ALBUMIN 2.9*  2.6* 2.7*   No results for input(s): LIPASE, AMYLASE in the last 168 hours. No results for input(s): AMMONIA in the last 168 hours. CBC:  Recent Labs Lab 05/31/15 1653 05/31/15 1704 06/01/15 0628 06/02/15 0457  WBC 10.3  --  10.9* 8.0  NEUTROABS 5.7  --   --   --   HGB 10.0* 10.9* 9.2* 9.7*  HCT 32.4* 32.0* 30.4* 32.0*  MCV 90.5  --  92.1 91.4  PLT 225  --  189 232   Cardiac Enzymes: No results for input(s): CKTOTAL, CKMB, CKMBINDEX, TROPONINI in the last 168 hours. BNP (last 3 results)  Recent Labs  08/10/14 0608 10/03/14 1217 10/10/14 0314  BNP 170.7* 323.1* 479.9*    ProBNP (last 3 results) No results  for input(s): PROBNP in the last 8760 hours.  CBG:  Recent Labs Lab 06/02/15 1235 06/02/15 1738 06/02/15 2119 06/03/15 0744 06/03/15 1243  GLUCAP 237* 288* 159* 219* 182*    Recent Results (from the past 240 hour(s))  Blood culture (routine x 2)     Status: None (Preliminary result)   Collection Time: 05/31/15  4:53 PM  Result Value Ref Range Status   Specimen Description BLOOD LEFT ARM  Final   Special Requests BOTTLES DRAWN AEROBIC ONLY 5CC  Final   Culture NO GROWTH 3 DAYS  Final   Report Status PENDING  Incomplete  Blood culture (routine x 2)     Status: None (Preliminary result)   Collection Time: 05/31/15  5:05 PM  Result Value Ref Range Status   Specimen Description BLOOD RIGHT HAND  Final   Special Requests BOTTLES DRAWN AEROBIC ONLY 5CC  Final   Culture NO GROWTH 3 DAYS  Final   Report Status PENDING  Incomplete  Urine culture     Status: None   Collection Time: 05/31/15  5:43 PM  Result Value Ref Range Status   Specimen Description URINE, CATHETERIZED  Final   Special Requests NONE  Final   Culture NO GROWTH 1 DAY  Final   Report Status 06/01/2015 FINAL  Final  MRSA PCR Screening     Status: None   Collection Time: 06/01/15  1:10 PM  Result Value Ref Range Status   MRSA by PCR NEGATIVE NEGATIVE Final    Comment:        The GeneXpert MRSA Assay (FDA approved for NASAL specimens only), is one component of a comprehensive MRSA colonization surveillance program. It is not intended to diagnose MRSA infection nor to guide or monitor treatment for MRSA infections.      Studies: No results found.  Scheduled Meds: . amitriptyline  25 mg Oral QHS  . aspirin  81 mg Oral Daily  . carvedilol  3.125 mg Oral BID WC  . cefUROXime  500 mg Oral BID WC  . darbepoetin (ARANESP) injection - NON-DIALYSIS  40 mcg Subcutaneous Q Tue-1800  . docusate sodium  100 mg Oral BID  . escitalopram  20 mg Oral Daily  . furosemide  20 mg Oral Daily  . gabapentin  200 mg Oral  BID  . heparin  5,000 Units Subcutaneous 3 times per day  . insulin aspart  0-15 Units Subcutaneous TID WC  . insulin aspart  4 Units Subcutaneous TID WC  . insulin detemir  40 Units Subcutaneous Daily  . levothyroxine  50 mcg Oral QAC breakfast  . multivitamin-lutein  2 capsule Oral Daily  . pantoprazole  40 mg Oral Daily  . rosuvastatin  20 mg Oral  QPM   Continuous Infusions:    Time spent: 25 minutes  [facility] [email or web address], password [facility] 06/03/2015, 1:16 PM  LOS: 3 days

## 2015-06-03 NOTE — Clinical Social Work Note (Signed)
Clinical Social Work Assessment  Patient Details  Name: [name] MRN: [id number] Date of Birth: Oct 26, 1934  Date of referral:  06/03/15               Reason for consult:  Facility Placement                Permission sought to share information with:  Family Supports Permission granted to share information::  Yes, Verbal Permission Granted  Name::     Butch Penny  Agency::  [facility]  Relationship::  dtr  Contact Information:     Housing/Transportation Living arrangements for the past 2 months:  [place] of Information:  Patient Patient Interpreter Needed:  None Criminal Activity/Legal Involvement Pertinent to Current Situation/Hospitalization:  No - Comment as needed Significant Relationships:  Adult Children Lives with:  Facility Resident Do you feel safe going back to the place where you live?  No Need for family participation in patient care:  No (Coment)  Care giving concerns:  None- pt is long term care resident at [facility] / plan:  CSW spoke with pt concerning return to [facility] when stable to leave the hospital  Employment status:  Retired Forensic scientist:  Programmer, applications PT Recommendations:  [place] / Referral to community resources:  [place]  Patient/Family's Response to care:  Patient is agreeable to return to [facility] when stable  Patient/Family's Understanding of and Emotional Response to Diagnosis, Current Treatment, and Prognosis:  No questions or concerns at this time.  Emotional Assessment Appearance:    Attitude/Demeanor/Rapport:    Affect (typically observed):  Appropriate Orientation:  Oriented to Self, Oriented to Place, Oriented to  Time, Oriented to Situation Alcohol / Substance use:  Not Applicable Psych involvement (Current and /or in the community):  No (Comment)  Discharge Needs  Concerns to be addressed:  Care  Coordination Readmission within the last 30 days:  No Current discharge risk:  Physical Impairment Barriers to Discharge:  Continued Medical Work up   [employer], LCSW 06/03/2015, 3:01 PM

## 2015-06-04 ENCOUNTER — Encounter: Payer: Self-pay | Admitting: Cardiovascular Disease

## 2015-06-04 ENCOUNTER — Telehealth: Payer: Self-pay | Admitting: Cardiovascular Disease

## 2015-06-04 LAB — BASIC METABOLIC PANEL
Anion gap: 11 (ref 5–15)
BUN: 18 mg/dL (ref 6–20)
CALCIUM: 9.4 mg/dL (ref 8.9–10.3)
CO2: 25 mmol/L (ref 22–32)
CREATININE: 1.47 mg/dL — AB (ref 0.44–1.00)
Chloride: 107 mmol/L (ref 101–111)
GFR calc Af Amer: 38 mL/min — ABNORMAL LOW (ref >60)
GFR, EST NON AFRICAN AMERICAN: 33 mL/min — AB (ref >60)
GLUCOSE: 144 mg/dL — AB (ref 65–99)
Potassium: 3.9 mmol/L (ref 3.5–5.1)
SODIUM: 143 mmol/L (ref 135–145)

## 2015-06-04 LAB — GLUCOSE, CAPILLARY
GLUCOSE-CAPILLARY: 159 mg/dL — AB (ref 65–99)
Glucose-Capillary: 148 mg/dL — ABNORMAL HIGH (ref 65–99)
Glucose-Capillary: 216 mg/dL — ABNORMAL HIGH (ref 65–99)
Glucose-Capillary: 127 mg/dL — ABNORMAL HIGH (ref 65–99)

## 2015-06-04 LAB — URINE CULTURE

## 2015-06-04 MED ORDER — CARVEDILOL 6.25 MG PO TABS
6.2500 mg | ORAL_TABLET | Freq: Two times a day (BID) | ORAL | Status: DC
  Administered 2015-06-05 – 2015-06-07 (×5): 6.25 mg via ORAL
  Filled 2015-06-04 (×5): qty 1

## 2015-06-04 MED ORDER — SENNA 8.6 MG PO TABS
2.0000 | ORAL_TABLET | Freq: Every day | ORAL | Status: DC
Start: 2015-06-04 — End: 2015-06-07
  Administered 2015-06-04 – 2015-06-07 (×4): 17.2 mg via ORAL
  Filled 2015-06-04 (×4): qty 2

## 2015-06-04 MED ORDER — DEXTROSE 5 % IV SOLN
2.0000 g | INTRAVENOUS | Status: DC
  Administered 2015-06-04 – 2015-06-06 (×3): 2 g via INTRAVENOUS
  Filled 2015-06-04 (×4): qty 2

## 2015-06-04 MED ORDER — POLYETHYLENE GLYCOL 3350 17 G PO PACK
17.0000 g | PACK | Freq: Two times a day (BID) | ORAL | Status: DC
  Administered 2015-06-04 – 2015-06-07 (×7): 17 g via ORAL
  Filled 2015-06-04 (×7): qty 1

## 2015-06-04 MED ORDER — AMLODIPINE BESYLATE 10 MG PO TABS
10.0000 mg | ORAL_TABLET | Freq: Every day | ORAL | Status: DC
  Administered 2015-06-04 – 2015-06-06 (×3): 10 mg via ORAL
  Filled 2015-06-04 (×3): qty 1

## 2015-06-04 MED ORDER — BISACODYL 10 MG RE SUPP: 10.0000 mg | RECTAL | Status: DC | PRN

## 2015-06-04 NOTE — Progress Notes (Signed)
[facility] PROGRESS NOTE  [name] [id number] DOB: May 16, 1935 DOA: 05/31/2015 PCP: Mayra Neer, MD  Summary 81 y.o. female with h/o recurrent UTIs, DM2, HTN, patient presents to the ED with fever and lethargy that has been ongoing since Friday. Fevers up to 102 at NH, has urinary "pressure" like sensation. BP was low in the 70s at the NH so EMS was called  Assessment/Plan:  Principal Problem:   UTI (lower urinary tract infection): cx negative. Still with fevers. Repeat UA with many epis, but also TNTC WBC and bacteria. Will resume cefepime for now. Active Problems: Constipation: miralax and senna   Diabetes mellitus without complication ([place]): CBGs reasonably contorolled   Anemia: anemia panel consistent with chronic disease possible iron deficiency component. s/p IV iron and ESA   Essential hypertension: BP creeping up. Resume amlodipine   Chronic diastolic HF. compensated   Acute on chronic renal failure [facility]): resolved   Chronic kidney disease (CKD), stage III - IV (severe) (HCC) Abnormal LFTs: hypotension related, resolved  Code Status:  DNR Family Communication:  Pt is lucid Disposition Plan:  SNF once afebrile and constipation resolved  Consultants:    Procedures:     Antibiotics:  Cefepime  vancomycin  HPI/Subjective: Poor appetite. No BM for about a week. No dyspnea  Objective: Filed Vitals:   06/03/15 2344 06/04/15 0503  BP: 149/69 131/46  Pulse:  93  Temp: 98.4 F (36.9 C) 98.8 F (37.1 C)  Resp:  18    Intake/Output Summary (Last 24 hours) at 06/04/15 1341 Last data filed at 06/04/15 1038  Gross per 24 hour  Intake     60 ml  Output      0 ml  Net     60 ml   Filed Weights   05/31/15 1743 05/31/15 2150  Weight: 80.74 kg (178 lb) 83.28 kg (183 lb 9.6 oz)    Exam:   General:  A and o in chair. Breathing nonlabored  Cardiovascular: RRR without MGR  Respiratory: CTA without WRR  Abdomen: obese. S, nt, nd  Ext:  no CCE  Basic Metabolic Panel:  Recent Labs Lab 05/31/15 1653 05/31/15 1704 06/01/15 0628 06/02/15 0457 06/03/15 0543 06/04/15 0442  NA 139 139 139 140 144 143  K 4.9 4.9 4.8 4.4 4.2 3.9  CL 103 104 109 107 108 107  CO2 25  --  22 22 24 25   GLUCOSE 91 85 157* 260* 203* 144*  BUN 61* 62* 51* 36* 22* 18  CREATININE 2.55* 2.40* 1.98* 1.68* 1.52* 1.47*  CALCIUM 8.9  --  8.2* 8.7* 9.3 9.4   Liver Function Tests:  Recent Labs Lab 05/31/15 1653 06/02/15 0457 06/03/15 0543  AST 188* 45* 25  ALT 130* 72* 51  ALKPHOS 131* 136* 123  BILITOT 0.3 <0.1* 0.5  PROT 6.6 6.0* 6.5  ALBUMIN 2.9* 2.6* 2.7*   No results for input(s): LIPASE, AMYLASE in the last 168 hours. No results for input(s): AMMONIA in the last 168 hours. CBC:  Recent Labs Lab 05/31/15 1653 05/31/15 1704 06/01/15 0628 06/02/15 0457  WBC 10.3  --  10.9* 8.0  NEUTROABS 5.7  --   --   --   HGB 10.0* 10.9* 9.2* 9.7*  HCT 32.4* 32.0* 30.4* 32.0*  MCV 90.5  --  92.1 91.4  PLT 225  --  189 232   Cardiac Enzymes: No results for input(s): CKTOTAL, CKMB, CKMBINDEX, TROPONINI in the last 168 hours. BNP (last 3 results)  Recent Labs  08/10/14 0608 10/03/14 1217 10/10/14 0314  BNP 170.7* 323.1* 479.9*    ProBNP (last 3 results) No results for input(s): PROBNP in the last 8760 hours.  CBG:  Recent Labs Lab 06/03/15 1243 06/03/15 1737 06/03/15 2114 06/04/15 0802 06/04/15 1243  GLUCAP 182* 76 113* 159* 148*    Recent Results (from the past 240 hour(s))  Blood culture (routine x 2)     Status: None (Preliminary result)   Collection Time: 05/31/15  4:53 PM  Result Value Ref Range Status   Specimen Description BLOOD LEFT ARM  Final   Special Requests BOTTLES DRAWN AEROBIC ONLY 5CC  Final   Culture NO GROWTH 3 DAYS  Final   Report Status PENDING  Incomplete  Blood culture (routine x 2)     Status: None (Preliminary result)   Collection Time: 05/31/15  5:05 PM  Result Value Ref Range Status    Specimen Description BLOOD RIGHT HAND  Final   Special Requests BOTTLES DRAWN AEROBIC ONLY 5CC  Final   Culture NO GROWTH 3 DAYS  Final   Report Status PENDING  Incomplete  Urine culture     Status: None   Collection Time: 05/31/15  5:43 PM  Result Value Ref Range Status   Specimen Description URINE, CATHETERIZED  Final   Special Requests NONE  Final   Culture NO GROWTH 1 DAY  Final   Report Status 06/01/2015 FINAL  Final  MRSA PCR Screening     Status: None   Collection Time: 06/01/15  1:10 PM  Result Value Ref Range Status   MRSA by PCR NEGATIVE NEGATIVE Final    Comment:        The GeneXpert MRSA Assay (FDA approved for NASAL specimens only), is one component of a comprehensive MRSA colonization surveillance program. It is not intended to diagnose MRSA infection nor to guide or monitor treatment for MRSA infections.   Urine culture     Status: None (Preliminary result)   Collection Time: 06/03/15  3:30 PM  Result Value Ref Range Status   Specimen Description URINE, CLEAN CATCH  Final   Special Requests NONE  Final   Culture TOO YOUNG TO READ  Final   Report Status PENDING  Incomplete     Studies: No results found.  Scheduled Meds: . amitriptyline  25 mg Oral QHS  . aspirin  81 mg Oral Daily  . carvedilol  3.125 mg Oral BID WC  . cefUROXime  500 mg Oral BID WC  . darbepoetin (ARANESP) injection - NON-DIALYSIS  40 mcg Subcutaneous Q Tue-1800  . docusate sodium  100 mg Oral BID  . escitalopram  20 mg Oral Daily  . furosemide  20 mg Oral Daily  . gabapentin  200 mg Oral BID  . heparin  5,000 Units Subcutaneous 3 times per day  . insulin aspart  0-15 Units Subcutaneous TID WC  . insulin aspart  4 Units Subcutaneous TID WC  . insulin detemir  40 Units Subcutaneous Daily  . levothyroxine  50 mcg Oral QAC breakfast  . multivitamin-lutein  2 capsule Oral Daily  . pantoprazole  40 mg Oral Daily  . polyethylene glycol  17 g Oral BID  . rosuvastatin  20 mg Oral QPM  .  senna  2 tablet Oral Daily   Continuous Infusions:    Time spent: 25 minutes  [facility] [email or web address], password [facility] 06/04/2015, 1:41 PM  LOS: 4 days

## 2015-06-04 NOTE — Telephone Encounter (Signed)
New Message ° °This message is to inform you that we have made 3 consecutive attempts to contact the patient since 03/25/2015 We have also mailed two letters to the patient to inform them to call in and schedule. Although we were unsuccessful in these attempts we wanted you to be aware of our efforts. Will remove the patient from our recall list at this time  ° ° °Shanti °[facility] ° °

## 2015-06-04 NOTE — Progress Notes (Signed)
ANTIBIOTIC CONSULT NOTE - INITIAL  Pharmacy Consult for Cefepime Indication: UTI  Patient Measurements: Height: 5\' 3"  (160 cm) Weight: 183 lb 9.6 oz (83.28 kg) IBW/kg (Calculated) : 52.4  Vital Signs: Temp: 98.8 F (37.1 C) (01/19 0503) Temp Source: Oral (01/19 0503) BP: 131/46 mmHg (01/19 0503) Pulse Rate: 93 (01/19 0503) Labs:   Recent Labs  06/02/15 0457 06/03/15 0543 06/04/15 0442  WBC 8.0  --   --   HGB 9.7*  --   --   PLT 232  --   --   CREATININE 1.68* 1.52* 1.47*    Microbiology: 1/15 BCx: ngtd 1/15 UCx: ngF 1/16 PCR: MRSA negative 1/18 UCx: px   Anti-infective's  1/15 Cefepime 1/17 - 1/19 >>  1/17 Ceftin 1/19  Assessment: 62 yoF who developed new onset fever while on Ceftin for treatment of UTI. Pharmacy consulted to broaden coverage to Cefepime  Goal of Therapy:  Treatment of infection    Plan:  1. Cefepime 2 grams IV q 24 hours  2. F/u clinical response  Vincenza Hews, PharmD, BCPS 06/04/2015, 2:19 PM Pager: [phone]

## 2015-06-05 LAB — GLUCOSE, CAPILLARY
GLUCOSE-CAPILLARY: 195 mg/dL — AB (ref 65–99)
GLUCOSE-CAPILLARY: 460 mg/dL — AB (ref 65–99)
GLUCOSE-CAPILLARY: 273 mg/dL — AB (ref 65–99)
GLUCOSE-CAPILLARY: 295 mg/dL — AB (ref 65–99)
Glucose-Capillary: 458 mg/dL — ABNORMAL HIGH (ref 65–99)
Glucose-Capillary: 519 mg/dL — ABNORMAL HIGH (ref 65–99)
Glucose-Capillary: 397 mg/dL — ABNORMAL HIGH (ref 65–99)

## 2015-06-05 LAB — BASIC METABOLIC PANEL
Anion gap: 11 (ref 5–15)
BUN: 27 mg/dL — ABNORMAL HIGH (ref 6–20)
CHLORIDE: 103 mmol/L (ref 101–111)
CO2: 27 mmol/L (ref 22–32)
CREATININE: 1.94 mg/dL — AB (ref 0.44–1.00)
Calcium: 9.2 mg/dL (ref 8.9–10.3)
GFR calc non Af Amer: 24 mL/min — ABNORMAL LOW (ref >60)
GFR, EST AFRICAN AMERICAN: 27 mL/min — AB (ref >60)
Glucose, Bld: 514 mg/dL — ABNORMAL HIGH (ref 65–99)
Potassium: 4 mmol/L (ref 3.5–5.1)
Sodium: 141 mmol/L (ref 135–145)

## 2015-06-05 LAB — CULTURE, BLOOD (ROUTINE X 2)
CULTURE: NO GROWTH
Culture: NO GROWTH

## 2015-06-05 MED ORDER — INSULIN ASPART 100 UNIT/ML ~~LOC~~ SOLN
8.0000 [IU] | Freq: Three times a day (TID) | SUBCUTANEOUS | Status: DC
  Administered 2015-06-05 – 2015-06-07 (×4): 8 [IU] via SUBCUTANEOUS

## 2015-06-05 NOTE — Progress Notes (Signed)
CSW received a message from [place] at [facility] that Pt family did not show at facility to complete paperwork.   Per facility the Pt can not return to the facility until the Pt's family signs facility paperwork.   CSW will inform weekend CSW to follow up with family and if family completes paperwork, Narda Rutherford can be reached on the weekend number.   [facility] [phone]

## 2015-06-05 NOTE — Care Management Note (Signed)
Case Management Note  Patient Details  Name: EIZA TESKA MRN: [id number] Date of Birth: [birth year]  Subjective/Objective:                    Action/Plan:  UR updated  Expected Discharge Date:  06/04/15               Expected Discharge Plan:  Skilled Nursing Facility  In-House Referral:  Clinical Social Work  Discharge planning Services     Post Acute Care Choice:    Choice offered to:     DME Arranged:    DME Agency:     HH Arranged:    [place] Agency:     Status of Service:  In process, will continue to follow  Medicare Important Message Given:  Yes Date Medicare IM Given:    Medicare IM give by:    Date Additional Medicare IM Given:    Additional Medicare Important Message give by:     If discussed at [place] of Stay Meetings, dates discussed:    Additional Comments:  Marilu Favre, RN 06/05/2015, 7:49 AM

## 2015-06-05 NOTE — Progress Notes (Signed)
Physical Therapy Treatment Patient Details Name: [name] MRN: [id number] DOB: 09-03-1934 Today's Date: 06/05/2015    History of Present Illness Pt is a 81 y.o. female with a UTI. Pt has other significant PMH of HTN, macular degeneration, type II DM, COPD, peripheral neuropathy, PAD, CKD, TIA, tobacco use, and altered mental status.    PT Comments    Patient is making slow progress toward goals with improved bed mobility. Mod/max A +2 for OOB mobility. Continue to progress as tolerated with anticipated d/c to SNF for further skilled PT services.    Follow Up Recommendations  SNF     Equipment Recommendations  None recommended by PT    Recommendations for Other Services       Precautions / Restrictions Precautions Precautions: None Restrictions Weight Bearing Restrictions: No    Mobility  Bed Mobility Overal bed mobility: Needs Assistance Bed Mobility: Supine to Sit     Supine to sit: Min assist     General bed mobility comments: vc for technique with HOB elevated and use of bedrails; min A to elevate trunk and scoot hips to EOB with use of bed pad  Transfers Overall transfer level: Needs assistance Equipment used: Rolling walker (2 wheeled) Transfers: Sit to/from [facility] Sit to Stand: +2 physical assistance;Min assist Stand pivot transfers: Mod assist;Max assist;+2 physical assistance       General transfer comment: vc for hand and foot placement and technique; 2 attempts before achieving upright posture with posterior lean initially; pt took few steps to chair with mod/max A +2 due to knee buckling and difficulty maintaining balance with upright posture  Ambulation/Gait                 Stairs            Wheelchair Mobility    Modified Rankin (Stroke Patients Only)       Balance Overall balance assessment: Needs assistance Sitting-balance support: Bilateral upper extremity supported;Feet supported Sitting  balance-Leahy Scale: Poor     Standing balance support: Bilateral upper extremity supported Standing balance-Leahy Scale: Zero                      Cognition Arousal/Alertness: Awake/alert Behavior During Therapy: WFL for tasks assessed/performed Overall Cognitive Status: Impaired/Different from baseline Area of Impairment: Awareness;Orientation;Safety/judgement Orientation Level: Situation       Safety/Judgement: Decreased awareness of safety;Decreased awareness of deficits Awareness: Intellectual        Exercises      General Comments        Pertinent Vitals/Pain Pain Assessment: No/denies pain    Home Living                      Prior Function            PT Goals (current goals can now be found in the care plan section) Acute Rehab PT Goals Patient Stated Goal: no goal stated PT Goal Formulation: With patient Time For Goal Achievement: 06/16/15 Potential to Achieve Goals: Fair Progress towards PT goals: Progressing toward goals    Frequency  Min 2X/week    PT Plan Current plan remains appropriate    Co-evaluation             End of Session Equipment Utilized During Treatment: Gait belt Activity Tolerance: Patient tolerated treatment well Patient left: in chair;with chair alarm set;with call bell/phone within reach     Time: 1451-1507 PT Time Calculation (min) (ACUTE  ONLY): 16 min  Charges:  $Therapeutic Activity: 8-22 mins                    G Codes:      [place] April, PTA Pager: [phone]   06/05/2015, 4:17 PM

## 2015-06-05 NOTE — Progress Notes (Signed)
CSW was contacted by facility to see if Pt would be ready for d/c today.   CSW contacted Pt. MD and Pt will not be ready for d/c today as the Pt is not medically stable.   CSW contacted [facility]'s admission coordinator and passed along information.   Facility will be able to accept Pt when medically ready for d/c.   [facility] [phone]

## 2015-06-05 NOTE — Telephone Encounter (Signed)
Patient in hospital at this time. Patient is usually lives in nursing home. Daughter Vernelle Emerald stated patient is in hospital at this time. Informed her that patient is due for a yearly follow-up. Patient's daughter stated that patient can barely get around in a wheelchair, so it might not be possible to make office visits. Patient's daughter is going to talk with nursing home and give our office a call back.

## 2015-06-05 NOTE — Progress Notes (Signed)
Dr.  Hartford Poli notified of CBG [phone]

## 2015-06-05 NOTE — Progress Notes (Signed)
[facility] PROGRESS NOTE  ARMONEE HUBBERT [id number] DOB: 27-Oct-1934 DOA: 05/31/2015 PCP: Mayra Neer, MD   Subjective:  Had a fever of 101.3 last night, restarted back on cefepime. Repeated urine culture.  Summary 81 y.o. female with h/o recurrent UTIs, DM2, HTN, patient presents to the ED with fever and lethargy that has been ongoing since Friday. Fevers up to 102 at NH, has urinary "pressure" like sensation. Today BP was low in the 70s at the NH so EMS was called  Assessment/Plan: Principal Problem:   UTI (lower urinary tract infection) Active Problems:   Diabetes mellitus without complication (HCC)   Anemia   Essential hypertension   Diastolic dysfunction, grade II   Acute on chronic renal failure (HCC)   Chronic kidney disease (CKD), stage III - IV (severe) (HCC)   Neck muscle spasm   UTI Patient presented with fever, urinalysis consistent with UTI. Patient started on cefepime on admission for UTI. Last previous 2 UTIs was enterococcus.  Developed fever and Ceftin, discontinued and started on cefepime.  Diabetes mellitus type 2 without complications On carbohydrate modified diet, insulin sliding scale. Lantus increased, follow CBGs closely.  Anemia iron indices consistent with anemia of chronic disease with low iron. Patient given IV iron and Aranesp.  Hypertension IV hypertensive on hold because of hypotension on admission, Coreg restarted.  Chronic diastolic CHF Initially Lasix held and started on IV fluids. Restarted Lasix.  CKD stage III Creatinine at baseline at 1.52, follow renal function.  Neck muscle spasm This is resolved.  Transaminitis This is likely mild form of shocked liver secondary to hypotension, this is completely resolved. . Constipation On MiraLAX and senna  Code Status:  DNR Family Communication:  Pt is lucid Disposition Plan:  SNF 1-2 days if stable  Consultants:    Procedures:      Antibiotics:  Cefepime  vancomycin    Objective: Filed Vitals:   06/04/15 2218 06/05/15 0546  BP:  130/59  Pulse:  107  Temp: 98.9 F (37.2 C) 98.5 F (36.9 C)  Resp:  18    Intake/Output Summary (Last 24 hours) at 06/05/15 1427 Last data filed at 06/05/15 1011  Gross per 24 hour  Intake    330 ml  Output      0 ml  Net    330 ml   Filed Weights   05/31/15 1743 05/31/15 2150  Weight: 80.74 kg (178 lb) 83.28 kg (183 lb 9.6 oz)    Exam:   General:  A and o in chair. Breathing nonlabored  Cardiovascular: RRR without MGR  Respiratory: CTA without WRR  Abdomen: obese. S, nt, nd  Ext: no CCE  Basic Metabolic Panel:  Recent Labs Lab 06/01/15 0628 06/02/15 0457 06/03/15 0543 06/04/15 0442 06/05/15 1256  NA 139 140 144 143 141  K 4.8 4.4 4.2 3.9 4.0  CL 109 107 108 107 103  CO2 22 22 24 25 27   GLUCOSE 157* 260* 203* 144* 514*  BUN 51* 36* 22* 18 27*  CREATININE 1.98* 1.68* 1.52* 1.47* 1.94*  CALCIUM 8.2* 8.7* 9.3 9.4 9.2   Liver Function Tests:  Recent Labs Lab 05/31/15 1653 06/02/15 0457 06/03/15 0543  AST 188* 45* 25  ALT 130* 72* 51  ALKPHOS 131* 136* 123  BILITOT 0.3 <0.1* 0.5  PROT 6.6 6.0* 6.5  ALBUMIN 2.9* 2.6* 2.7*   No results for input(s): LIPASE, AMYLASE in the last 168 hours. No results for input(s): AMMONIA in the last 168 hours. CBC:  Recent Labs Lab 05/31/15 1653 05/31/15 1704 06/01/15 0628 06/02/15 0457  WBC 10.3  --  10.9* 8.0  NEUTROABS 5.7  --   --   --   HGB 10.0* 10.9* 9.2* 9.7*  HCT 32.4* 32.0* 30.4* 32.0*  MCV 90.5  --  92.1 91.4  PLT 225  --  189 232   Cardiac Enzymes: No results for input(s): CKTOTAL, CKMB, CKMBINDEX, TROPONINI in the last 168 hours. BNP (last 3 results)  Recent Labs  08/10/14 0608 10/03/14 1217 10/10/14 0314  BNP 170.7* 323.1* 479.9*    ProBNP (last 3 results) No results for input(s): PROBNP in the last 8760 hours.  CBG:  Recent Labs Lab 06/05/15 0756  06/05/15 1205 06/05/15 1215 06/05/15 1232 06/05/15 1355  GLUCAP 195* 519* 458* 460* 397*    Recent Results (from the past 240 hour(s))  Blood culture (routine x 2)     Status: None   Collection Time: 05/31/15  4:53 PM  Result Value Ref Range Status   Specimen Description BLOOD LEFT ARM  Final   Special Requests BOTTLES DRAWN AEROBIC ONLY 5CC  Final   Culture NO GROWTH 5 DAYS  Final   Report Status 06/05/2015 FINAL  Final  Blood culture (routine x 2)     Status: None   Collection Time: 05/31/15  5:05 PM  Result Value Ref Range Status   Specimen Description BLOOD RIGHT HAND  Final   Special Requests BOTTLES DRAWN AEROBIC ONLY 5CC  Final   Culture NO GROWTH 5 DAYS  Final   Report Status 06/05/2015 FINAL  Final  Urine culture     Status: None   Collection Time: 05/31/15  5:43 PM  Result Value Ref Range Status   Specimen Description URINE, CATHETERIZED  Final   Special Requests NONE  Final   Culture NO GROWTH 1 DAY  Final   Report Status 06/01/2015 FINAL  Final  MRSA PCR Screening     Status: None   Collection Time: 06/01/15  1:10 PM  Result Value Ref Range Status   MRSA by PCR NEGATIVE NEGATIVE Final    Comment:        The GeneXpert MRSA Assay (FDA approved for NASAL specimens only), is one component of a comprehensive MRSA colonization surveillance program. It is not intended to diagnose MRSA infection nor to guide or monitor treatment for MRSA infections.   Urine culture     Status: None   Collection Time: 06/03/15  3:30 PM  Result Value Ref Range Status   Specimen Description URINE, CLEAN CATCH  Final   Special Requests NONE  Final   Culture MULTIPLE SPECIES PRESENT, SUGGEST RECOLLECTION  Final   Report Status 06/04/2015 FINAL  Final     Studies: No results found.  Scheduled Meds: . amitriptyline  25 mg Oral QHS  . amLODipine  10 mg Oral QHS  . aspirin  81 mg Oral Daily  . carvedilol  6.25 mg Oral BID WC  . ceFEPime (MAXIPIME) IV  2 g Intravenous Q24H  .  darbepoetin (ARANESP) injection - NON-DIALYSIS  40 mcg Subcutaneous Q Tue-1800  . docusate sodium  100 mg Oral BID  . escitalopram  20 mg Oral Daily  . furosemide  20 mg Oral Daily  . gabapentin  200 mg Oral BID  . heparin  5,000 Units Subcutaneous 3 times per day  . insulin aspart  0-15 Units Subcutaneous TID WC  . insulin aspart  4 Units Subcutaneous TID WC  . insulin detemir  40 Units Subcutaneous Daily  . levothyroxine  50 mcg Oral QAC breakfast  . multivitamin-lutein  2 capsule Oral Daily  . pantoprazole  40 mg Oral Daily  . polyethylene glycol  17 g Oral BID  . rosuvastatin  20 mg Oral QPM  . senna  2 tablet Oral Daily   Continuous Infusions:    Time spent: 25 minutes  [facility] [email or web address], password [facility] 06/05/2015, 2:27 PM  LOS: 5 days

## 2015-06-06 DIAGNOSIS — M6248 Contracture of muscle, other site: Secondary | ICD-10-CM

## 2015-06-06 LAB — BASIC METABOLIC PANEL
Anion gap: 10 (ref 5–15)
BUN: 30 mg/dL — AB (ref 6–20)
CALCIUM: 9 mg/dL (ref 8.9–10.3)
CO2: 30 mmol/L (ref 22–32)
CREATININE: 1.86 mg/dL — AB (ref 0.44–1.00)
Chloride: 105 mmol/L (ref 101–111)
GFR calc non Af Amer: 25 mL/min — ABNORMAL LOW (ref >60)
GFR, EST AFRICAN AMERICAN: 29 mL/min — AB (ref >60)
Glucose, Bld: 231 mg/dL — ABNORMAL HIGH (ref 65–99)
Potassium: 4.1 mmol/L (ref 3.5–5.1)
SODIUM: 145 mmol/L (ref 135–145)

## 2015-06-06 LAB — GLUCOSE, CAPILLARY
GLUCOSE-CAPILLARY: 192 mg/dL — AB (ref 65–99)
GLUCOSE-CAPILLARY: 303 mg/dL — AB (ref 65–99)
Glucose-Capillary: 166 mg/dL — ABNORMAL HIGH (ref 65–99)
Glucose-Capillary: 120 mg/dL — ABNORMAL HIGH (ref 65–99)

## 2015-06-06 NOTE — Progress Notes (Signed)
Per MD, Pt not ready for d/c today.  Notified facility.  Bernita Raisin, [place] [facility] [phone]

## 2015-06-06 NOTE — Progress Notes (Signed)
Per Narda Rutherford, Admissions Coordinator at [facility], family will need to commit to signing paperwork tomorrow at 16 or 12.  Notified Pt's daughter.  Pt's daughter apologized for her brother's no-show and stated that she will be at [facility] tomorrow at 11 to sign the paperwork.  Notified Janie, Admissions Coordinator at [facility], that daughter will be in tomorrow at 58.  Bernita Raisin, [place] [facility] [phone]

## 2015-06-06 NOTE — Progress Notes (Signed)
[facility] PROGRESS NOTE  TIMMEKA TULLOS [id number] DOB: [birth year] DOA: 05/31/2015 PCP: Mayra Neer, MD   Subjective:  Feels okay, restarted back on cefepime. Discussed with the brother at bedside, needs to be afebrile for at least 24 hours prior to discharge.  Summary 81 y.o. female with h/o recurrent UTIs, DM2, HTN, patient presents to the ED with fever and lethargy that has been ongoing since Friday. Fevers up to 102 at NH, has urinary "pressure" like sensation. Today BP was low in the 70s at the NH so EMS was called  Assessment/Plan: Principal Problem:   UTI (lower urinary tract infection) Active Problems:   Diabetes mellitus without complication (HCC)   Anemia   Essential hypertension   Diastolic dysfunction, grade II   Acute on chronic renal failure (HCC)   Chronic kidney disease (CKD), stage III - IV (severe) (HCC)   Neck muscle spasm    UTI Patient presented with fever, urinalysis consistent with UTI. Patient started on cefepime on admission for UTI. Last previous 2 UTIs was enterococcus.  Developed fever on Ceftin, discontinued and started on cefepime.  Diabetes mellitus type 2 without complications On carbohydrate modified diet, insulin sliding scale. Lantus increased, follow CBGs closely.  Anemia iron indices consistent with anemia of chronic disease with low iron. Patient given IV iron and Aranesp.  Hypertension IV hypertensive on hold because of hypotension on admission, Coreg restarted.  Chronic diastolic CHF Initially Lasix held and started on IV fluids. Restarted Lasix.  CKD stage III Creatinine at baseline at 1.52, follow renal function.  Neck muscle spasm This is resolved.  Transaminitis This is likely mild form of shocked liver secondary to hypotension, this is completely resolved. . Constipation On MiraLAX and senna   Code Status:  DNR Family Communication:  Pt is lucid Disposition Plan:  SNF 1-2 days if  stable  Consultants:    Procedures:     Antibiotics:  Cefepime  vancomycin    Objective: Filed Vitals:   06/05/15 2218 06/06/15 0600  BP: 138/35 155/44  Pulse: 84 85  Temp: 98.5 F (36.9 C) 98.4 F (36.9 C)  Resp: 18 18    Intake/Output Summary (Last 24 hours) at 06/06/15 1335 Last data filed at 06/06/15 0900  Gross per 24 hour  Intake    720 ml  Output      0 ml  Net    720 ml   Filed Weights   05/31/15 1743 05/31/15 2150  Weight: 80.74 kg (178 lb) 83.28 kg (183 lb 9.6 oz)    Exam:   General:  A and o in chair. Breathing nonlabored  Cardiovascular: RRR without MGR  Respiratory: CTA without WRR  Abdomen: obese. S, nt, nd  Ext: no CCE  Basic Metabolic Panel:  Recent Labs Lab 06/02/15 0457 06/03/15 0543 06/04/15 0442 06/05/15 1256 06/06/15 0556  NA 140 144 143 141 145  K 4.4 4.2 3.9 4.0 4.1  CL 107 108 107 103 105  CO2 22 24 25 27 30   GLUCOSE 260* 203* 144* 514* 231*  BUN 36* 22* 18 27* 30*  CREATININE 1.68* 1.52* 1.47* 1.94* 1.86*  CALCIUM 8.7* 9.3 9.4 9.2 9.0   Liver Function Tests:  Recent Labs Lab 05/31/15 1653 06/02/15 0457 06/03/15 0543  AST 188* 45* 25  ALT 130* 72* 51  ALKPHOS 131* 136* 123  BILITOT 0.3 <0.1* 0.5  PROT 6.6 6.0* 6.5  ALBUMIN 2.9* 2.6* 2.7*   No results for input(s): LIPASE, AMYLASE in the last 168 hours.  No results for input(s): AMMONIA in the last 168 hours. CBC:  Recent Labs Lab 05/31/15 1653 05/31/15 1704 06/01/15 0628 06/02/15 0457  WBC 10.3  --  10.9* 8.0  NEUTROABS 5.7  --   --   --   HGB 10.0* 10.9* 9.2* 9.7*  HCT 32.4* 32.0* 30.4* 32.0*  MCV 90.5  --  92.1 91.4  PLT 225  --  189 232   Cardiac Enzymes: No results for input(s): CKTOTAL, CKMB, CKMBINDEX, TROPONINI in the last 168 hours. BNP (last 3 results)  Recent Labs  08/10/14 0608 10/03/14 1217 10/10/14 0314  BNP 170.7* 323.1* 479.9*    ProBNP (last 3 results) No results for input(s): PROBNP in the last 8760  hours.  CBG:  Recent Labs Lab 06/05/15 1232 06/05/15 1355 06/05/15 1657 06/05/15 2217 06/06/15 0813  GLUCAP 460* 397* 273* 295* 192*    Recent Results (from the past 240 hour(s))  Blood culture (routine x 2)     Status: None   Collection Time: 05/31/15  4:53 PM  Result Value Ref Range Status   Specimen Description BLOOD LEFT ARM  Final   Special Requests BOTTLES DRAWN AEROBIC ONLY 5CC  Final   Culture NO GROWTH 5 DAYS  Final   Report Status 06/05/2015 FINAL  Final  Blood culture (routine x 2)     Status: None   Collection Time: 05/31/15  5:05 PM  Result Value Ref Range Status   Specimen Description BLOOD RIGHT HAND  Final   Special Requests BOTTLES DRAWN AEROBIC ONLY 5CC  Final   Culture NO GROWTH 5 DAYS  Final   Report Status 06/05/2015 FINAL  Final  Urine culture     Status: None   Collection Time: 05/31/15  5:43 PM  Result Value Ref Range Status   Specimen Description URINE, CATHETERIZED  Final   Special Requests NONE  Final   Culture NO GROWTH 1 DAY  Final   Report Status 06/01/2015 FINAL  Final  MRSA PCR Screening     Status: None   Collection Time: 06/01/15  1:10 PM  Result Value Ref Range Status   MRSA by PCR NEGATIVE NEGATIVE Final    Comment:        The GeneXpert MRSA Assay (FDA approved for NASAL specimens only), is one component of a comprehensive MRSA colonization surveillance program. It is not intended to diagnose MRSA infection nor to guide or monitor treatment for MRSA infections.   Urine culture     Status: None   Collection Time: 06/03/15  3:30 PM  Result Value Ref Range Status   Specimen Description URINE, CLEAN CATCH  Final   Special Requests NONE  Final   Culture MULTIPLE SPECIES PRESENT, SUGGEST RECOLLECTION  Final   Report Status 06/04/2015 FINAL  Final     Studies: No results found.  Scheduled Meds: . amitriptyline  25 mg Oral QHS  . amLODipine  10 mg Oral QHS  . aspirin  81 mg Oral Daily  . carvedilol  6.25 mg Oral BID WC  .  ceFEPime (MAXIPIME) IV  2 g Intravenous Q24H  . darbepoetin (ARANESP) injection - NON-DIALYSIS  40 mcg Subcutaneous Q Tue-1800  . docusate sodium  100 mg Oral BID  . escitalopram  20 mg Oral Daily  . furosemide  20 mg Oral Daily  . gabapentin  200 mg Oral BID  . heparin  5,000 Units Subcutaneous 3 times per day  . insulin aspart  0-15 Units Subcutaneous TID WC  .  insulin aspart  8 Units Subcutaneous TID WC  . insulin detemir  40 Units Subcutaneous Daily  . levothyroxine  50 mcg Oral QAC breakfast  . multivitamin-lutein  2 capsule Oral Daily  . pantoprazole  40 mg Oral Daily  . polyethylene glycol  17 g Oral BID  . rosuvastatin  20 mg Oral QPM  . senna  2 tablet Oral Daily   Continuous Infusions:    Time spent: 25 minutes  [facility] [email or web address], password [facility] 06/06/2015, 1:35 PM  LOS: 6 days

## 2015-06-07 LAB — BASIC METABOLIC PANEL
ANION GAP: 9 (ref 5–15)
BUN: 31 mg/dL — ABNORMAL HIGH (ref 6–20)
CHLORIDE: 106 mmol/L (ref 101–111)
CO2: 26 mmol/L (ref 22–32)
Calcium: 8.7 mg/dL — ABNORMAL LOW (ref 8.9–10.3)
Creatinine, Ser: 1.78 mg/dL — ABNORMAL HIGH (ref 0.44–1.00)
GFR calc non Af Amer: 26 mL/min — ABNORMAL LOW (ref >60)
GFR, EST AFRICAN AMERICAN: 30 mL/min — AB (ref >60)
Glucose, Bld: 241 mg/dL — ABNORMAL HIGH (ref 65–99)
POTASSIUM: 3.9 mmol/L (ref 3.5–5.1)
Sodium: 141 mmol/L (ref 135–145)

## 2015-06-07 LAB — GLUCOSE, CAPILLARY
GLUCOSE-CAPILLARY: 309 mg/dL — AB (ref 65–99)
Glucose-Capillary: 201 mg/dL — ABNORMAL HIGH (ref 65–99)

## 2015-06-07 MED ORDER — FOSFOMYCIN TROMETHAMINE 3 G PO PACK
3.0000 g | PACK | Freq: Once | ORAL | Status: AC
  Administered 2015-06-07: 3 g via ORAL
  Filled 2015-06-07: qty 3

## 2015-06-07 MED ORDER — HYDROCODONE-ACETAMINOPHEN 5-325 MG PO TABS
1.0000 | ORAL_TABLET | Freq: Four times a day (QID) | ORAL | Status: AC | PRN
Start: 2015-06-07 — End: ?

## 2015-06-07 NOTE — Progress Notes (Signed)
Per MD, Pt ready for d/c.  Notified facility.  Facility ready to receive Pt.  Notified Pt, RN and Pt's daughter, Butch Penny.  Arranged for [facility] transport.  RN to give report at [phone].  Pt to be d/c'd.  Bernita Raisin, [place] Social [facility] [phone]

## 2015-06-07 NOTE — Discharge Summary (Signed)
Physician Discharge Summary  [name] [id number] DOB: 28-Jun-1934 DOA: 05/31/2015  PCP: Mayra Neer, MD  Admit date: 05/31/2015 Discharge date: 06/07/2015  Time spent: 40 minutes  Recommendations for Outpatient Follow-up:  Follow-up with primary care physician within one week.  Discharge Diagnoses:  Principal Problem:   UTI (lower urinary tract infection) Active Problems:   Diabetes mellitus without complication (HCC)   Anemia   Essential hypertension   Diastolic dysfunction, grade II   Acute on chronic renal failure (HCC)   Chronic kidney disease (CKD), stage III - IV (severe) (HCC)   Neck muscle spasm   Discharge Condition: Stable  Diet recommendation: Heart healthy  Filed Weights   05/31/15 1743 05/31/15 2150  Weight: 80.74 kg (178 lb) 83.28 kg (183 lb 9.6 oz)    History of present illness:  [name] is a 81 y.o. female with h/o recurrent UTIs, DM2, HTN, patient presents to the ED with fever and lethargy that has been ongoing since Friday. Fevers up to 102 at NH, has urinary "pressure" like sensation. Today BP was low in the 70s at the NH so EMS was called, improved with IVF. Patient is now feeling much better and actually eating in the room.  No CP, headache, cough, sob, abd pain, nor other symptoms.  Hospital Course:   UTI Patient presented with fever, urinalysis consistent with UTI. Patient started on cefepime on admission for UTI. Last previous 2 UTIs was enterococcus.  Patient has recurrent UTIs likely secondary to wearing diapers in the nursing home. Started on cefepime on admission, switch to Ceftin developed fever and chills, UA repeated with TNTC WBC. Restarted back on cefepime, given dose of fosfomycin prior to discharge. No fever or chills, no UTI symptoms on discharge.  Diabetes mellitus type 2 without complications On carbohydrate modified diet, insulin sliding scale. Restart home medication on discharge.  Anemia iron indices  consistent with anemia of chronic disease with low iron. Patient given IV iron and Aranesp.  Hypertension IV hypertensive on hold because of hypotension on admission, Coreg restarted.  Chronic diastolic CHF Initially Lasix held and started on IV fluids. Restarted Lasix. Appears euvolemic on discharge.  CKD stage III Creatinine around baseline, creatinine is 1.78 on discharge..  Neck muscle spasm This is resolved.  Transaminitis This is likely mild form of shocked liver secondary to hypotension, this is resolved. . Constipation On MiraLAX and senna   Procedures:  None  Consultations:  None  Discharge Exam: Filed Vitals:   06/06/15 2132 06/07/15 0432  BP: 147/50 138/51  Pulse: 103 74  Temp: 99.3 F (37.4 C) 99 F (37.2 C)  Resp: 17 17   General: Alert and awake, oriented x3, not in any acute distress. HEENT: anicteric sclera, pupils reactive to light and accommodation, EOMI CVS: S1-S2 clear, no murmur rubs or gallops Chest: clear to auscultation bilaterally, no wheezing, rales or rhonchi Abdomen: soft nontender, nondistended, normal bowel sounds, no organomegaly Extremities: no cyanosis, clubbing or edema noted bilaterally Neuro: Cranial nerves II-XII intact, no focal neurological deficits  Discharge Instructions   Discharge Instructions    Diet - low sodium heart healthy    Complete by:  As directed      Increase activity slowly    Complete by:  As directed           Current Discharge Medication List    CONTINUE these medications which have CHANGED   Details  HYDROcodone-acetaminophen (NORCO/VICODIN) 5-325 MG tablet Take 1 tablet by mouth every 6 (six)  hours as needed for moderate pain or severe pain. Qty: 20 tablet, Refills: 0      CONTINUE these medications which have NOT CHANGED   Details  !! acetaminophen (TYLENOL) 325 MG tablet Take 650 mg by mouth every 4 (four) hours as needed (for elevated temp of 100 degrees X24 hours).    !!  acetaminophen (TYLENOL) 500 MG tablet Take 500 mg by mouth 3 (three) times daily. 8am 12pm, 4pm    albuterol (PROVENTIL) (2.5 MG/3ML) 0.083% nebulizer solution Take 2.5 mg by nebulization every 6 (six) hours as needed for wheezing.    alum & mag hydroxide-simeth (MAALOX/MYLANTA) 200-200-20 MG/5ML suspension Take 30 mLs by mouth every 6 (six) hours as needed for indigestion or heartburn. Do not exceed 4 doses in 24 hours    amitriptyline (ELAVIL) 50 MG tablet Take 0.5 tablets (25 mg total) by mouth at bedtime. Qty: 10 tablet, Refills: 0    amLODipine (NORVASC) 10 MG tablet Take 10 mg by mouth at bedtime.     aspirin 81 MG chewable tablet Chew 81 mg by mouth daily.    B Complex-C (B-COMPLEX WITH VITAMIN C) tablet Take 1 tablet by mouth daily.    bisacodyl (DULCOLAX) 10 MG suppository Place 10 mg rectally as needed for moderate constipation.    carvedilol (COREG) 6.25 MG tablet Take 1 tablet (6.25 mg total) by mouth 2 (two) times daily with a meal.    cholecalciferol (VITAMIN D) 1000 units tablet Take 1,000 Units by mouth daily.    cloNIDine (CATAPRES) 0.2 MG tablet Take 0.2 mg by mouth 2 (two) times daily. Hold if blood pressure less than 100/60 (8am, 8pm)    Cranberry 250 MG TABS Take 1,000 mg by mouth 2 (two) times daily.    docusate sodium (COLACE) 100 MG capsule Take 1 capsule (100 mg total) by mouth every 12 (twelve) hours. Qty: 60 capsule, Refills: 0    escitalopram (LEXAPRO) 20 MG tablet Take 20 mg by mouth daily.    famotidine (PEPCID) 20 MG tablet Take 1 tablet (20 mg total) by mouth 2 (two) times daily as needed for heartburn or indigestion.    furosemide (LASIX) 40 MG tablet Take 0.5 tablets (20 mg total) by mouth daily. Qty: 30 tablet    gabapentin (NEURONTIN) 100 MG capsule Take 2 capsules (200 mg total) by mouth 2 (two) times daily. Qty: 20 capsule, Refills: 0    guaiFENesin (ROBITUSSIN) 100 MG/5ML liquid Take 100 mg by mouth every 6 (six) hours as needed for cough.  Not to exceed 4 doses in 24 hours    insulin aspart (NOVOLOG) 100 UNIT/ML injection Inject 0-9 Units into the skin 3 (three) times daily with meals. Sliding scale CBG 70 - 120: 0 units CBG 121 - 150: 1 unit,  CBG 151 - 200: 2 units,  CBG 201 - 250: 3 units,  CBG 251 - 300: 5 units,  CBG 301 - 350: 7 units,  CBG 351 - 400: 9 units   CBG > 400: 9 units and notify your MD Qty: 10 mL, Refills: 11    insulin detemir (LEVEMIR) 100 UNIT/ML injection Inject 30-42 Units into the skin at bedtime. Inject 42 units subcutaneously every morning and 30 units daily at bedtime    levothyroxine (SYNTHROID, LEVOTHROID) 50 MCG tablet Take 50 mcg by mouth daily before breakfast.     loperamide (IMODIUM) 2 MG capsule Take 2 mg by mouth as needed for diarrhea or loose stools. Do not exceed 8 doses in  24 hours    Multiple Vitamins-Minerals (PRESERVISION AREDS 2) CAPS Take 1-2 capsules by mouth 2 (two) times daily. Take 2 capsules by mouth every morning and 1 capsule in the evening    ondansetron (ZOFRAN ODT) 4 MG disintegrating tablet Take 1 tablet (4 mg total) by mouth every 8 (eight) hours as needed for nausea or vomiting. Qty: 20 tablet, Refills: 0    pantoprazole (PROTONIX) 40 MG tablet Take 1 tablet (40 mg total) by mouth daily. Qty: 30 tablet, Refills: 1    polyethylene glycol (MIRALAX / GLYCOLAX) packet Take 17 g by mouth daily as needed for mild constipation, moderate constipation or severe constipation. Mix in 8 oz liquid and drink Qty: 14 each, Refills: 0    rosuvastatin (CRESTOR) 20 MG tablet Take 20 mg by mouth every evening. 4pm     !! - Potential duplicate medications found. Please discuss with provider.     Allergies  Allergen Reactions  . Ace Inhibitors Other (See Comments)    Listed on MAR  . Codeine Other (See Comments)    Listed on MAR  . Lisinopril Other (See Comments)    Listed on MAR  . Penicillins Hives    Listed on MAR - no other information available  . Trimethoprim Other (See  Comments)    Listed on MAR  . Septra [Sulfamethoxazole-Trimethoprim] Hives and Rash  . Sulfur Hives and Rash   Follow-up Information    Follow up with [facility] .   Specialty:  [place] information:   [address] [place] [phone]       The results of significant diagnostics from this hospitalization (including imaging, microbiology, ancillary and laboratory) are listed below for reference.    Significant Diagnostic Studies: Dg Chest Portable 1 View  05/31/2015  CLINICAL DATA:  Fever, weakness and lethargy beginning Friday, fever to 102 degrees, frequent UTIs, history hypertension, type II diabetes mellitus, COPD, smoker EXAM: PORTABLE CHEST 1 VIEW COMPARISON:  Portable exam 1656 hours compared to 10/09/2014 FINDINGS: Rotation to the RIGHT. Upper normal heart size. Mediastinal contours and pulmonary vascularity normal. Bronchitic changes with resolution of pulmonary edema versus previous exam. No acute infiltrate, pleural effusion or pneumothorax. Prior cervical spine fusion. Bones demineralized. Atherosclerotic calcification aorta. IMPRESSION: Bronchitic changes. Resolution of pulmonary edema seen on previous exam. No acute abnormalities. Electronically Signed   By: Lavonia Dana M.D.   On: 05/31/2015 17:09    Microbiology: Recent Results (from the past 240 hour(s))  Blood culture (routine x 2)     Status: None   Collection Time: 05/31/15  4:53 PM  Result Value Ref Range Status   Specimen Description BLOOD LEFT ARM  Final   Special Requests BOTTLES DRAWN AEROBIC ONLY 5CC  Final   Culture NO GROWTH 5 DAYS  Final   Report Status 06/05/2015 FINAL  Final  Blood culture (routine x 2)     Status: None   Collection Time: 05/31/15  5:05 PM  Result Value Ref Range Status   Specimen Description BLOOD RIGHT HAND  Final   Special Requests BOTTLES DRAWN AEROBIC ONLY 5CC  Final   Culture NO GROWTH 5 DAYS   Final   Report Status 06/05/2015 FINAL  Final  Urine culture     Status: None   Collection Time: 05/31/15  5:43 PM  Result Value Ref Range Status   Specimen Description URINE, CATHETERIZED  Final   Special Requests NONE  Final  Culture NO GROWTH 1 DAY  Final   Report Status 06/01/2015 FINAL  Final  MRSA PCR Screening     Status: None   Collection Time: 06/01/15  1:10 PM  Result Value Ref Range Status   MRSA by PCR NEGATIVE NEGATIVE Final    Comment:        The GeneXpert MRSA Assay (FDA approved for NASAL specimens only), is one component of a comprehensive MRSA colonization surveillance program. It is not intended to diagnose MRSA infection nor to guide or monitor treatment for MRSA infections.   Urine culture     Status: None   Collection Time: 06/03/15  3:30 PM  Result Value Ref Range Status   Specimen Description URINE, CLEAN CATCH  Final   Special Requests NONE  Final   Culture MULTIPLE SPECIES PRESENT, SUGGEST RECOLLECTION  Final   Report Status 06/04/2015 FINAL  Final     Labs: Basic Metabolic Panel:  Recent Labs Lab 06/03/15 0543 06/04/15 0442 06/05/15 1256 06/06/15 0556 06/07/15 0517  NA 144 143 141 145 141  K 4.2 3.9 4.0 4.1 3.9  CL 108 107 103 105 106  CO2 24 25 27 30 26   GLUCOSE 203* 144* 514* 231* 241*  BUN 22* 18 27* 30* 31*  CREATININE 1.52* 1.47* 1.94* 1.86* 1.78*  CALCIUM 9.3 9.4 9.2 9.0 8.7*   Liver Function Tests:  Recent Labs Lab 05/31/15 1653 06/02/15 0457 06/03/15 0543  AST 188* 45* 25  ALT 130* 72* 51  ALKPHOS 131* 136* 123  BILITOT 0.3 <0.1* 0.5  PROT 6.6 6.0* 6.5  ALBUMIN 2.9* 2.6* 2.7*   No results for input(s): LIPASE, AMYLASE in the last 168 hours. No results for input(s): AMMONIA in the last 168 hours. CBC:  Recent Labs Lab 05/31/15 1653 05/31/15 1704 06/01/15 0628 06/02/15 0457  WBC 10.3  --  10.9* 8.0  NEUTROABS 5.7  --   --   --   HGB 10.0* 10.9* 9.2* 9.7*  HCT 32.4* 32.0* 30.4* 32.0*  MCV 90.5  --  92.1  91.4  PLT 225  --  189 232   Cardiac Enzymes: No results for input(s): CKTOTAL, CKMB, CKMBINDEX, TROPONINI in the last 168 hours. BNP: BNP (last 3 results)  Recent Labs  08/10/14 0608 10/03/14 1217 10/10/14 0314  BNP 170.7* 323.1* 479.9*    ProBNP (last 3 results) No results for input(s): PROBNP in the last 8760 hours.  CBG:  Recent Labs Lab 06/06/15 0813 06/06/15 1452 06/06/15 1728 06/06/15 2134 06/07/15 0749  GLUCAP 192* 303* 166* 120* 201*       Signed:  Verlee Monte A MD.  [facility] 06/07/2015, 11:05 AM

## 2015-06-07 NOTE — Progress Notes (Signed)
Pharmacy Antibiotic Follow-up Note  [name] is a 81 y.o. year-old female admitted on 05/31/2015.  The patient is currently on day #4 of cefepime for UTI.  Assessment/Plan: S/p IV abx for presumed UTI. Still with fevers on Cefuroxime so switched back to IV. Now day #4 of cefepime restart for UTI due to repeat UA with many bacteria and TNTC WBC. Most likely contaminated due to epithelial cells. Tmax was 101.3 on 1/19, but now afebrile, WBC wnl  Plan: Continue cefepime 2g IV Q24 Monitor clinical picture, renal function F/U LOT  Consider need to continue abx. Has been afebrile past 2 days and most likely treated for UTI at this point  Temp (24hrs), Avg:99.1 F (37.3 C), Min:99 F (37.2 C), Max:99.3 F (37.4 C)   Recent Labs Lab 05/31/15 1653 06/01/15 0628 06/02/15 0457  WBC 10.3 10.9* 8.0    Recent Labs Lab 06/03/15 0543 06/04/15 0442 06/05/15 1256 06/06/15 0556 06/07/15 0517  CREATININE 1.52* 1.47* 1.94* 1.86* 1.78*   Estimated Creatinine Clearance: 26.6 mL/min (by C-G formula based on Cr of 1.78).    Allergies  Allergen Reactions  . Ace Inhibitors Other (See Comments)    Listed on MAR  . Codeine Other (See Comments)    Listed on MAR  . Lisinopril Other (See Comments)    Listed on MAR  . Penicillins Hives    Listed on MAR - no other information available  . Trimethoprim Other (See Comments)    Listed on MAR  . Septra [Sulfamethoxazole-Trimethoprim] Hives and Rash  . Sulfur Hives and Rash    Antimicrobials this admission: Cefepime 1/15 >> 1/17; 1/19 >>  Vancomycin 1/15 >> 1/17 Cefuroxime 1/17 >> 1/19  Levels/dose changes this admission: N/a  Microbiology results: Blood cx 1/15 > ngF Urine cx 1/15 > ngF Urine cx 1/18 > multiple species present  Thank you for allowing pharmacy to be a part of this patient's care.  Elenor Quinones, PharmD, BCPS Clinical Pharmacist Pager [phone] 06/07/2015 8:58 AM

## 2015-06-07 NOTE — Progress Notes (Signed)
IV removed. Belongings packed. Transportation arranged by SW via ambulance to [facility]. Report called to their RN at [phone].

## 2015-06-08 LAB — URINE CULTURE

## 2015-08-19 ENCOUNTER — Telehealth: Payer: Self-pay

## 2015-08-19 NOTE — Telephone Encounter (Signed)
Rn call [facility] and talk to [facility] who sent the fax. Rn explain patient was only seen one time on 01/2014 for a hospital follow up. Rn explain patient has never schedule a follow up with Dr.Xu. Rn explain that future office notes from [facility] can be forward to patients PCP. Mary verbalized understanding.

## 2016-01-29 ENCOUNTER — Ambulatory Visit (INDEPENDENT_AMBULATORY_CARE_PROVIDER_SITE_OTHER): Payer: Medicare HMO | Admitting: Ophthalmology

## 2016-05-24 IMAGING — CT CT ABD-PELV W/O CM
2 of 5 series · 17 of 46 positions shown, 19 images · non-contrast
Comparison: None.

CLINICAL DATA: Right upper quadrant pain, weakness

EXAM:
CT ABDOMEN AND PELVIS WITHOUT CONTRAST
TECHNIQUE: Multidetector CT imaging of the abdomen and pelvis was performed
following the standard protocol without IV contrast.

[Series 2: rtn a/p w/o · axial · non-contrast · 0.85mm/px · z∈[+1344,+1794]mm · 14 of 100 slices shown, 16 images]
[im 5/100  soft-tissue]
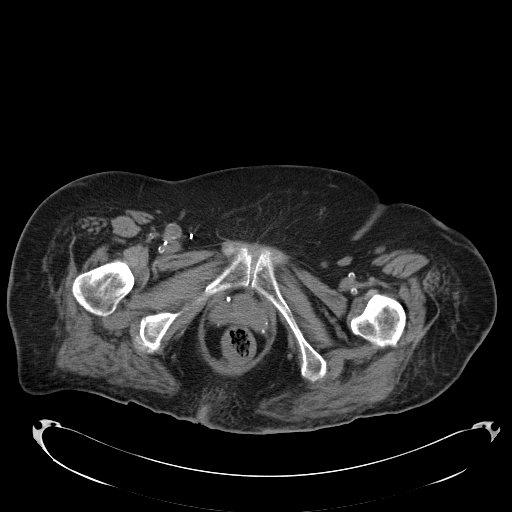
[im 5/100  bone]
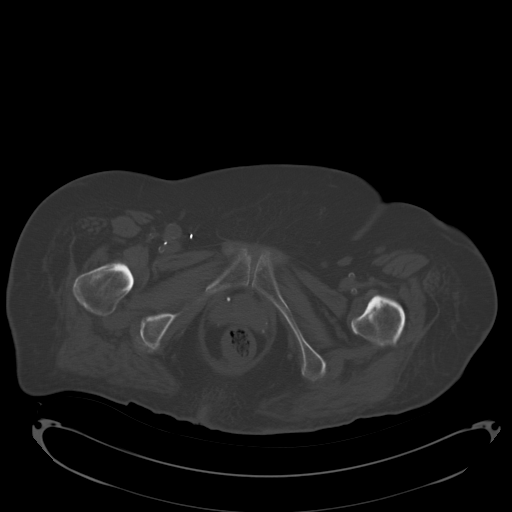
[im 15/100  soft-tissue]
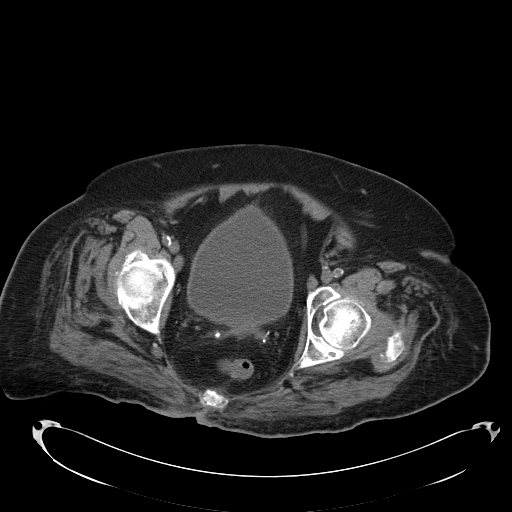
[im 19/100  soft-tissue]
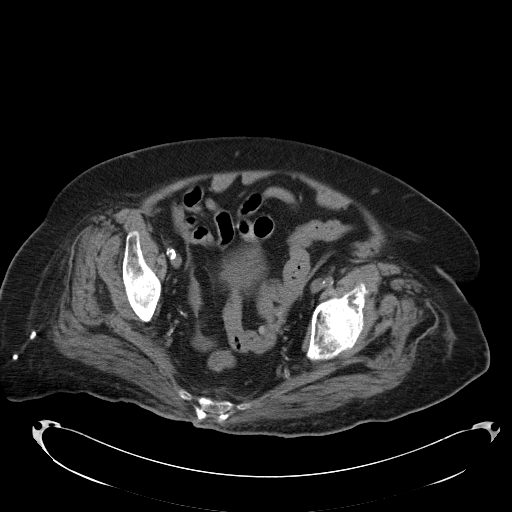
[im 29/100  soft-tissue]
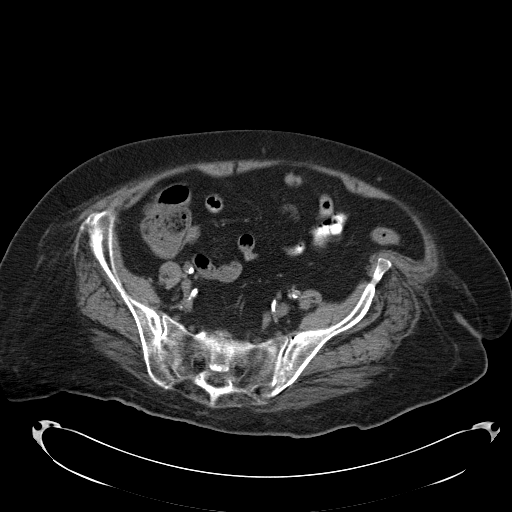
[im 34/100  soft-tissue]
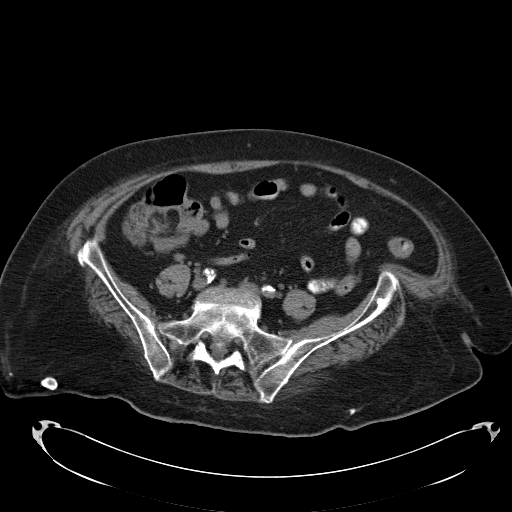
[im 38/100  soft-tissue]
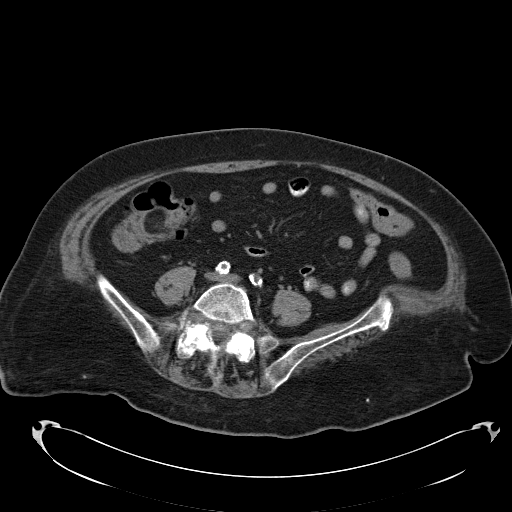
[im 48/100  soft-tissue]
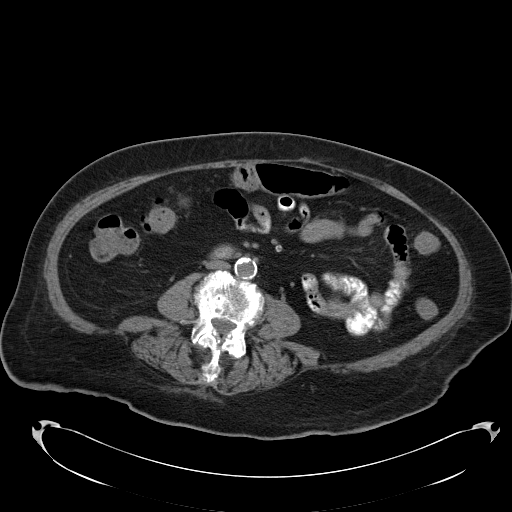
[im 52/100  soft-tissue]
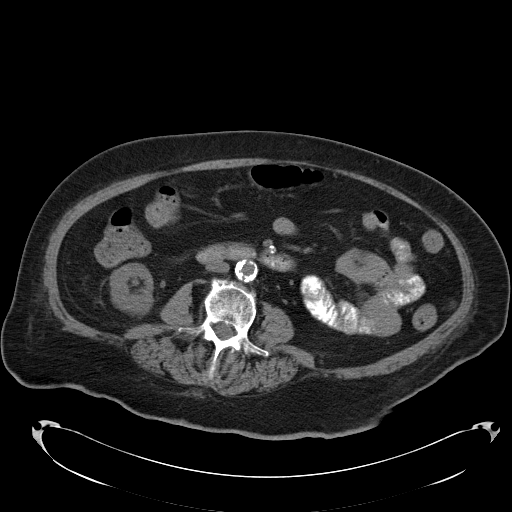
[im 62/100  soft-tissue]
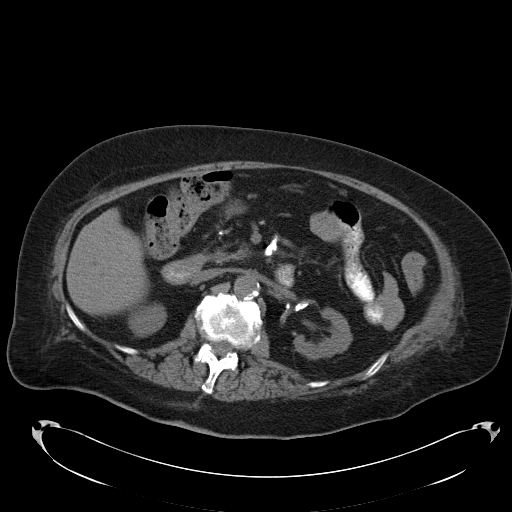
[im 62/100  bone]
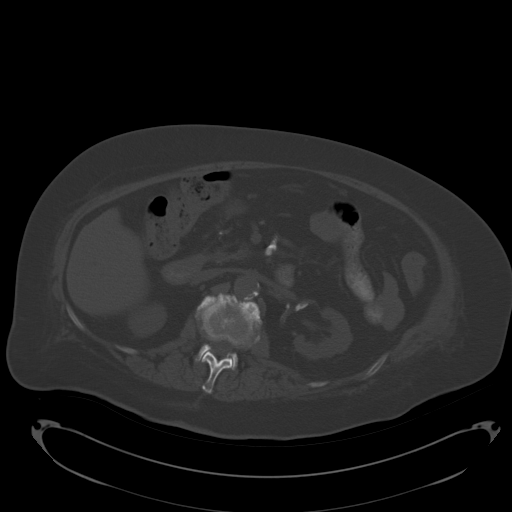
[im 67/100  soft-tissue]
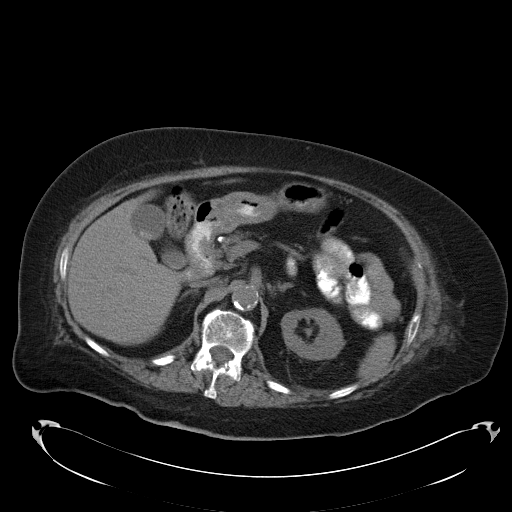
[im 76/100  soft-tissue]
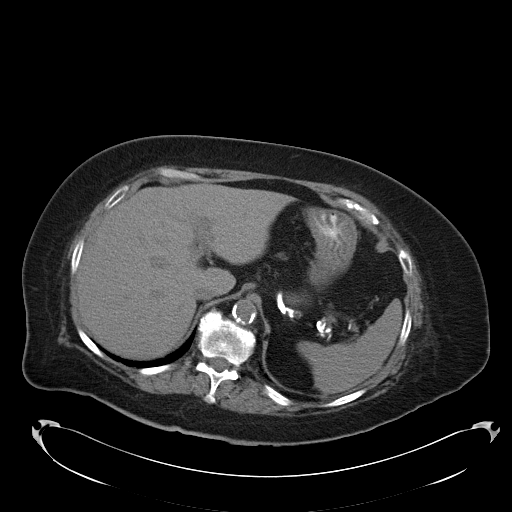
[im 81/100  soft-tissue]
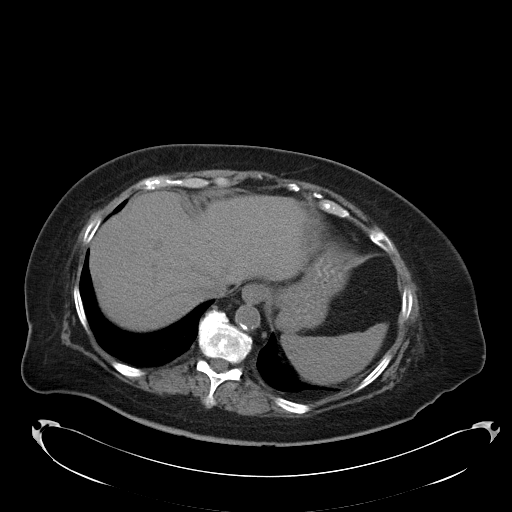
[im 85/100  soft-tissue]
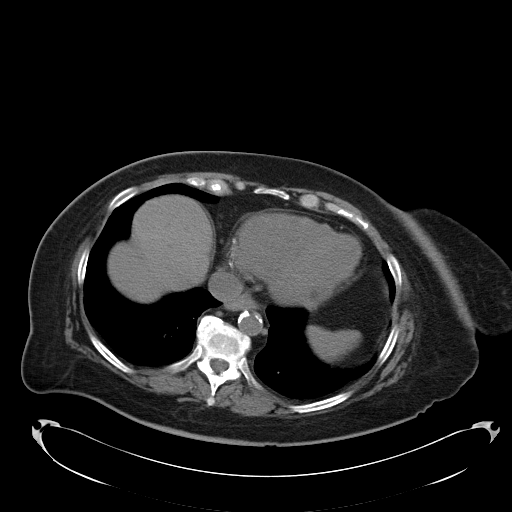
[im 95/100  soft-tissue]
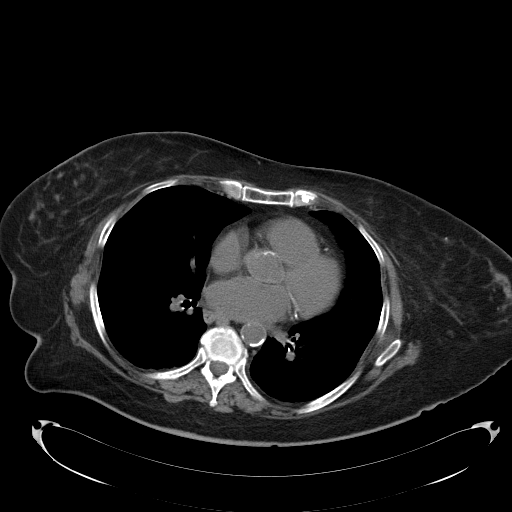

[Series 602: <mpr thick range> · coronal · 0.97mm/px · 3 of 141 slices shown]
[im 47/141  soft-tissue]
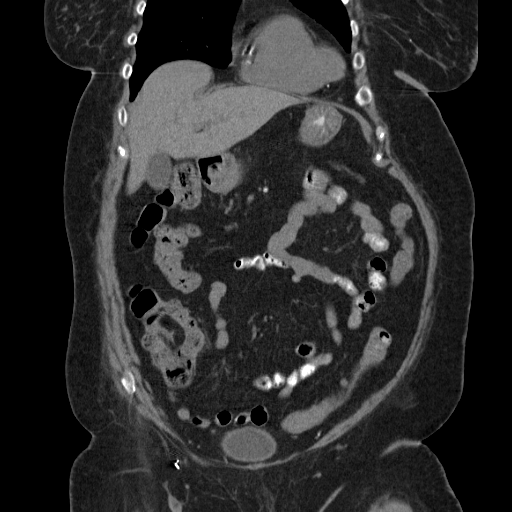
[im 63/141  soft-tissue]
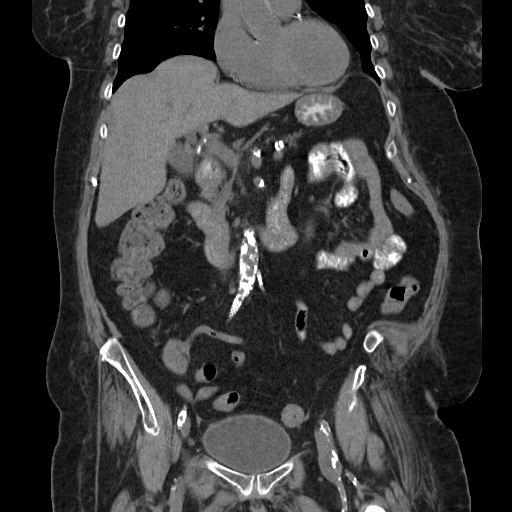
[im 78/141  soft-tissue]
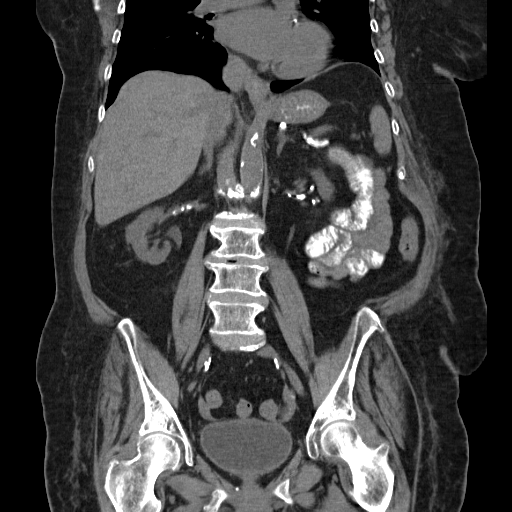

[17 of 46 positions shown; findings below may reference images not displayed]

FINDINGS: The lung bases are clear. Multivessel coronary artery
atherosclerosis.

No renal, ureteral, or bladder calculi. No obstructive uropathy. No
perinephric stranding is seen. The kidneys are symmetric in size
without evidence for exophytic mass. The bladder is unremarkable.

The liver demonstrates no focal abnormality. There are multiple
cholelithiasis. The spleen demonstrates no focal abnormality. The
adrenal glands and pancreas are normal.

The stomach, duodenum, small intestine and large intestine are
unremarkable. There is a normal caliber appendix in the right lower
quadrant without periappendiceal inflammatory changes. There is no
pneumoperitoneum, pneumatosis, or portal venous gas. There is no
abdominal or pelvic free fluid. There is no lymphadenopathy.

The abdominal aorta is normal in caliber with atherosclerosis.

Degenerative disc disease and degenerative facet arthropathy
throughout the lumbar spine. There is a moderate broad-based disc
bulge at L5-S1 with severe bilateral facet arthropathy resulting in
bilateral foraminal stenosis.
IMPRESSION: 1. Cholelithiasis.
2. No urolithiasis or obstructive uropathy.
3. Multi vessel coronary artery disease.
4. Lumbar spine spondylosis.

## 2016-09-08 ENCOUNTER — Other Ambulatory Visit: Payer: Self-pay | Admitting: Urology

## 2016-09-08 NOTE — Progress Notes (Signed)
Called [facility] and spoke to Shelby, nurse who is taking care of patient and requested MAR, Last H & P and any labs done within the last 30 days to be Faxed to [phone]. Patient has a POA, who is daughter, Vernelle Emerald , [phone]. Awaiting getting information from [facility].

## 2016-09-12 ENCOUNTER — Encounter (HOSPITAL_COMMUNITY): Payer: Self-pay | Admitting: *Deleted

## 2016-09-12 NOTE — Progress Notes (Signed)
Spoke to [employer] , nurse at [facility] taking care of patient requesting H & P, MAR, and any labs drawn in last 30 days Faxed to s.

## 2016-09-12 NOTE — Progress Notes (Signed)
Preop instructions for:      [name]                    Date of Birth    [birth year]                          Date of Procedure:   09/19/2016     Doctor:Dr Kathie Rhodes  Time to arrive at [facility]:  [id number]  Report to: Admitting  Procedure:cystoscopy with suprapubic tube placement  Any procedure time changes, MD office will notify you!   Do not eat or drink past midnight the night before your procedure.(To include any tube feedings-must be discontinued) Reminder:Follow bowel prep instructions per MD office!   Take these morning medications only with sips of water.(or give through gastrostomy or feeding tube). Synthroid ( Levothyroxine), Gabapentin ( Neurontin), Escitalopram( Lexapro), Myrbetriq, Loratidine, Pantoprazole, Carvedilol ( Coreg), Clonidine ( Catapres) if systolic blood pressure >045, Norco if needed for pain , LEVEMIR INSULIN - take 1/2 of pm dose nite before surgery .  Note: No Insulin or Diabetic meds should be given or taken the morning of the procedure!   Facility contact:   [facility]                Phone: [phone]                 Health Care WUJ:[id number]- Donna Xwaim- [phone]  Transportation contact phone#: [facility]   Please send day of procedure:current med list and meds last taken that day, confirm nothing by mouth status from what time, Patient Demographic info( to include DNR status, problem list, allergies)   RN contact name/phone#:                             and Fax #: [phone]  Bring Insurance card and picture ID Leave all jewelry and other valuables at place where living( no metal or rings to be worn) No contact lens Women-no make-up, no lotions,perfumes,powders Men-no colognes,lotions  Any questions day of procedure,call [facility]  [phone]   Sent from :[facility]                   [place]                   [phone]  Sent by    Gillian Shields RN

## 2016-09-18 NOTE — H&P (Signed)
HPI: [name] is a 82 year-old female who presents for conversion of a urethral Foley catheter to a suprapubic tube.  GU hx:  Chronic cystitis: She was seen and evaluated in 2005 for chronic cystitis and at that time she underwent cytology, CT scan and cystoscopy all which were unremarkable other than an inflamed appearing bladder. I entertained a diagnosis of interstitial cystitis and on 09/19/03 she underwent cystoscopy with hydrodistention and bladder biopsy. The biopsy revealed no evidence of malignancy but changes consistent with chronic inflammation. The patient returned after that procedure having noted complete resolution of her urinary symptoms and was quite pleased.    Interval history 07/09/15: She came in with her daughter today and indicates that she's been having multiple infections. She was hospitalized 3 times. The most recent with fever and sepsis. I reviewed her cultures in Epic however and found the following:  10/10/14 - negative (catheterized specimen)  05/31/15 - negative (catheterized specimen)  06/03/15 - negative (clean catch specimen)  06/05/15 - negative for bacteria with yeast present. (catheterized specimen)   Interval history 07/12/16: When I saw her last I placed her on low-dose nightly prophylaxis using trimethoprim.  She came in today but I received no information for many referring physicians. The patient indicates she has urinary frequency as her only complaint. She does not have any incontinence. She denies nocturia. She also denies dysuria. Her daughter was on the phone and indicated that she had been having some dysuria but the patient denies any dysuria. She said her only complaint is that of urinary frequency. Her daughter indicated that multiple urinalyses have shown infection although I received no documentation of this such as urinalyses or urine culture results.   Interval history 08/16/16: When she was seen last she was having significant urinary frequency and  was found to have greater than 1 L in her bladder. A Foley catheter was left indwelling. She underwent urodynamics for further evaluation.    Interval 09/01/16: Samples of myrbetriq 25mg  given for urine frequency. Pt was unwilling to learn CIC at last OV. She was very hesitant about urethral vs S/P tube management of incomplete bladder emptying. Although not having documentation, the patient presents stating a Foley catheter was replaced a few days after her last office visit. She denies being treated for a UTI. Denies fevers. States catheter has been draining well without significant painful urgency but she will inadvertently sit on the drainage tubing pulling on it causing pain and discomfort.   The patient complains of lower urinary tract symptom(s) that include frequency. Her symptoms did begin gradually. Patient denies getting up to urinate in the night. She does not have urgency.     ALLERGIES: Codeine Sulfate TABS Lisinopril TABS Penicillins Septra TABS    MEDICATIONS: Advair Diskus 100-50 MCG/DOSE Inhalation Aerosol Powder Breath Activated Inhalation  Albuterol 90 MCG/ACT AERS Inhalation  Amitriptyline HCl - 50 MG Oral Tablet Oral  Amlodipine Besylate 10 mg tablet Oral  Aspirin 81 mg tablet, chewable Oral  BD Pen Needle Ultrafine 29G X 12.7MM Miscellaneous Does Not Apply  Carafate 1 gram/10 ml suspension, oral Oral  Carvedilol 25 mg tablet Oral  Cipro 500 mg tablet Oral  Clonidine Hcl 0.2 mg tablet Oral  Crestor 20 MG Oral Tablet Oral  Docusate Sodium 100 mg tablet Oral  Ferrous Sulfate 325 MG CAPS Oral  Furosemide 40 mg tablet Oral  Gabapentin 100 mg capsule Oral  Humalog 100 unit/ml vial Subcutaneous  Lantus SoloStar 100 UNIT/ML SOLN Subcutaneous  Levothyroxine  Sodium 50 MCG Oral Tablet Oral  Lisinopril 40 MG Oral Tablet Oral  Nitrofurantoin Monohyd Macro 100 MG Oral Capsule 0 Oral Bedtime  Nitroglycerin 0.4 MG Sublingual Tablet Sublingual Sublingual  NovoLOG 100 UNIT/ML  Subcutaneous Solution Subcutaneous  Potassium Chloride Crys ER 20 MEQ Oral Tablet Extended Release Oral  Spiriva 18 mcg capsule, with inhalation device Inhalation  Vitamin D 400 UNIT TABS Oral  Xanax 0.25 MG Oral Tablet Oral     GU PSH: Catheterization For Collection Of Specimen, Single Patient, All Places Of Service - 07/12/2016 Complex cystometrogram, w/ void pressure and urethral pressure profile studies, any technique - 08/09/2016 Complex Uroflow - 08/09/2016 Cystoscopy Hydrodistention - 2011 Emg surf Electrd - 08/09/2016 Inject For cystogram - 08/09/2016 Intrabd voidng Press - 08/09/2016      PSH Notes: No Surgical Problems, Cystoscopy With Dilation Of Bladder   NON-GU PSH: No Non-GU PSH    GU PMH: Feeling of incomplete bladder emptying (Stable), She didn't want to consider the catheter either per urethra or suprapubic tube and did not want to consider CIC so since her remaining voiding symptoms is that of frequency I told her I could let her take Myrbetriq 25 mg with the understanding that this may not decrease her frequency if she ends up with a liter of urine in her bladder again. I'm going to have her return for reevaluation on the medication in 2 weeks. - 08/16/2016 Frequency of micturition, Her urine will be cultured today however it appears that her frequency is due to retention and not recurrent urinary tract infections based on all of the previous negative cultures. - 07/12/2016 Other retention of urine, With greater than 1 L in the bladder at the time of in and out catheterization for specimen this indicates that her urinary frequency is almost certainly due to her decreased functional volume caused by her markedly elevated PVR. She may have diabetic cystopathy although I will better understand what is going on with urodynamics. - 07/12/2016 Acute cystitis without hematuria, Acute cystitis without hematuria - 07/09/2015 Other chronic cystitis without hematuria, Chronic cystitis -  07/09/2015 Overactive bladder, Detrusor instability - 2014 Urge incontinence, Urge incontinence of urine - 2014      PMH Notes: Urinary incontinence: She reported that in 2011 she began having difficulty with frequent urination as well as urgency and urge incontinence. This required her to wear 5 protective pads in a day at times and is associated with nocturia 5-6 times. Recommended urodynamics at that time however she failed to return for the study.      NON-GU PMH: Encounter for general adult medical examination without abnormal findings, Encounter for preventive health examination - 07/09/2015 Personal history of other diseases of the circulatory system, History of cardiac disorder - 2015, History of hypertension, - 2014 Personal history of other diseases of the digestive system, History of esophageal reflux - 2015 Personal history of other diseases of the nervous system and sense organs, History of glaucoma - 2015 Personal history of other mental and behavioral disorders, History of depression - 2015 Anxiety disorder, unspecified, Anxiety (Symptom) - 2014 Chronic obstructive pulmonary disease, unspecified, Chronic Obstructive Pulmonary Disease - 2014 Personal history of diseases of the blood and blood-forming organs and certain disorders involving the immune mechanism, History of anemia - 2014 Personal history of other endocrine, nutritional and metabolic disease, History of hyperlipidemia - 2014, History of hypercholesterolemia, - 2014, History of hypothyroidism, - 2014, History of type 2 diabetes mellitus, - 2014    FAMILY HISTORY:  Death In The Family Father - Runs In Family Death In The Family Mother - 37 In [place] is 1 daughter and 1 - Runs In [place] _4__ Living Daughter - Runs In Family Kidney Cancer - Sister nephrolithiasis - Brother Prostate Cancer - Son   SOCIAL HISTORY: Marital Status: Widowed Current Smoking  Status: Patient does not smoke anymore.   Tobacco Use Assessment Completed: Used Tobacco in last 30 days? Has never drank.  Does not drink caffeine.     Notes: Current every day smoker, Caffeine use, Alcohol Use, Tobacco Use, Occupation:, Marital History - Widowed   REVIEW OF SYSTEMS:    GU Review Female:   Patient denies frequent urination, hard to postpone urination, burning /pain with urination, get up at night to urinate, leakage of urine, stream starts and stops, trouble starting your stream, have to strain to urinate, and currently pregnant.  Gastrointestinal (Upper):   Patient denies nausea, vomiting, and indigestion/ heartburn.  Gastrointestinal (Lower):   Patient denies diarrhea and constipation.  Constitutional:   Patient denies fever, night sweats, weight loss, and fatigue.  Skin:   Patient denies skin rash/ lesion and itching.  Eyes:   Patient denies blurred vision and double vision.  Ears/ Nose/ Throat:   Patient denies sore throat and sinus problems.  Hematologic/Lymphatic:   Patient denies swollen glands and easy bruising.  Cardiovascular:   Patient denies leg swelling and chest pains.  Respiratory:   Patient denies cough and shortness of breath.  Endocrine:   Patient denies excessive thirst.  Musculoskeletal:   Patient denies back pain and joint pain.  Neurological:   Patient denies headaches and dizziness.  Psychologic:   Patient denies anxiety and depression.   VITAL SIGNS:    Weight 156 lb / 70.76 kg  Height 63 in / 160.02 cm  BP 119/52 mmHg  Pulse 48 /min  BMI 27.6 kg/m        Notes: Foley catheter draining appropriately from urethra, clear-yellow urine noted.    Physical Exam  Constitutional: Well nourished and well developed . No acute distress.   ENT:. The ears and nose are normal in appearance.   Neck: The appearance of the neck is normal and no neck mass is present.   Pulmonary: No respiratory distress and normal respiratory rhythm and effort.    Cardiovascular: Heart rate and rhythm are normal . No peripheral edema.   Abdomen: The abdomen is soft and nontender. No masses are palpated. No CVA tenderness. No hernias are palpable. No hepatosplenomegaly noted.   Lymphatics: The femoral and inguinal nodes are not enlarged or tender.   Skin: Normal skin turgor, no visible rash and no visible skin lesions.   Neuro/Psych:. Mood and affect are appropriate     PAST DATA REVIEWED:  Source Of History:  Patient  Records Review:   Previous Patient Records  Urodynamics Review:   Review Urodynamics Tests   PROCEDURES:        Notes:   Patient has Foley catheter in place.   ASSESSMENT/PLAN: Per the patient, the catheter was replaced a few days after her last office visit. It is draining appropriately but patient does complain of pain when inadvertently sitting on the catheter tubing itself. She may benefit from a suprapubic tube to avoid long-term irritative and erosive effects to the meatus and urethra. She understands a suprapubic tube does not decrease her infection risk over urethral foley but for some is better tolerated in terms  of less bothersome LUTS. She denies/refuses wanting to learn CIC.  Placement of the suprapubic tube was therefore discussed and the patient would like to proceed with this procedure.  The procedure was discussed in detail including how the procedure would be performed, where the tube would be placed, the risks of the procedure as well as the potential complications, the outpatient nature of the procedure and the anticipated postoperative course.  She understands and has elected to proceed.  She has been placed on preoperative antibiotics by mouth.

## 2016-09-18 NOTE — Discharge Instructions (Signed)
Suprapubic Catheter Home Guide A suprapubic catheter is a rubber tube used to drain urine from the bladder into a collection bag. The catheter is inserted into the bladder through a small opening in the in the lower abdomen, near the center of the body, above the pubic bone (suprapubic area). There is a tiny balloon filled with germ-free (sterile) water on the end of the catheter that is in the bladder. The balloon helps to keep the catheter in place. Your suprapubic catheter may need to be replaced every 4-6 weeks, or as often as recommended by your health care provider. The collection bag must be emptied every day and cleaned every 2-3 days. The collection bag can be put beside your bed at night and attached to your leg during the day. You may have a large collection bag to use at night and a smaller one to use during the day. What are the risks?  Urine flow can become blocked. This can happen if the catheter is not working correctly, or if you have a blood clot in your bladder or in the catheter.  Tissue near the catheter may can become irritated and bleed.  Bacteria may get into your bladder and cause a urinary tract infection. How do I change the catheter? Supplies needed   Two pairs of sterile gloves.  Catheter.  Two syringes.  Sterile water.  Sterile cleaning solution.  Lubricant.  Collection bags. Changing the catheter  To replace your catheter, take the following steps: 1. Drink plenty of fluids during the hours before you plan to change the catheter. 2. Wash your hands with soap and water. If soap and water are not available, use hand sanitizer. 3. Lie on your back and put on sterile gloves. 4. Clean the skin around the catheter opening using the sterile cleaning solution. 5. Remove the water from the balloon using a syringe. 6. Slowly remove the catheter.  Do not pull on the catheter if it seems stuck.  Call your health care provider immediately if you have difficulty  removing the catheter. 7. Take off the used gloves, and put on a new pair. 8. Put lubricant on the end of the new catheter that will go into your bladder. 9. Gently slide the catheter through the opening in your abdomen and into your bladder. 10. Wait for some urine to start flowing through the catheter. When urine starts to flow through the catheter, use a new syringe to fill the balloon with sterile water. 11. Attach the collection bag to the end of the catheter. Make sure the connection is tight. 12. Remove the gloves and wash your hands with soap and water. How do I care for my skin around the catheter? Use a clean washcloth and soapy water to clean the skin around your catheter every day. Pat the area dry with a clean towel.  Do not pull on the catheter.  Do not use ointment or lotion on this area unless told by your health care provider.  Check your skin around the catheter every day for signs of infection. Check for:  Redness, swelling, or pain.  Fluid or blood.  Warmth.  Pus or a bad smell. How do I clean and empty the collection bag? Clean the collection bag every 2-3 days, or as often as told by your health care provider. To do this, take the following steps:  Wash your hands with soap and water. If soap and water are not available, use hand sanitizer.  Disconnect the bag  from the catheter and immediately attach a new bag to the catheter.  Empty the used bag completely.  Clean the used bag using one of the following methods:  Rinse the used bag with warm water and soap.  Fill the bag with water and add 1 tsp of vinegar. Let it sit for about 30 minutes, then empty the bag.  Let the bag dry completely, and put it in a clean plastic bag before storing it. 1.  Empty the large collection bag every 8 hours. Empty the small collection bag when it is about ? full. To empty your large or small collection bag, take the following steps:  Always keep the bag below the level of  the catheter. This keeps urine from flowing backwards into the catheter.  Hold the bag over the toilet or another container. Turn the valve (spigot) at the bottom of the bag to empty the urine.  Do not touch the opening of the spigot.  Do not let the opening touch the toilet or container.  Close the spigot tightly when the bag is empty. What are some general tips?  Always wash your hands before and after caring for your catheter and collection bag. Use a mild, fragrance-free soap. If soap and water are not available, use hand sanitizer.  Clean the catheter with soap and water as often as told by your health care provider.  Always make sure there are no twists or curls (kinks) in the catheter tube.  Always make sure there are no leaks in the catheter or collection bag.  Drink enough fluid to keep your urine clear or pale yellow.  Do not take baths, swim, or use a hot tub. When should I seek medical care? Seek medical care if:  You leak urine.  You have redness, swelling, or pain around your catheter opening.  You have fluid or blood coming from your catheter opening.  Your catheter opening feels warm to the touch.  You have pus or a bad smell coming from your catheter opening.  You have a fever or chills.  Your urine flow slows down.  Your urine becomes cloudy or smelly. When should I seek immediate medical care? Seek immediate medical care if your catheter comes out, or if you have:  Nausea.  Back pain.  Difficulty changing your catheter.  Blood in your urine.  No urine flow for 1 hour.

## 2016-09-19 ENCOUNTER — Ambulatory Visit (HOSPITAL_COMMUNITY): Payer: Medicare Other | Admitting: Certified Registered Nurse Anesthetist

## 2016-09-19 ENCOUNTER — Encounter (HOSPITAL_COMMUNITY)
Admission: RE | Disposition: A | Discharge status: Skilled Nursing Facility | Payer: Self-pay | Source: Ambulatory Visit | Attending: Urology

## 2016-09-19 ENCOUNTER — Ambulatory Visit (HOSPITAL_COMMUNITY)
Admission: RE | Admit: 2016-09-19 | Discharge: 2016-09-19 | Disposition: A | Discharge status: Skilled Nursing Facility | Payer: Medicare Other | Source: Ambulatory Visit | Attending: Urology | Admitting: Urology

## 2016-09-19 ENCOUNTER — Encounter (HOSPITAL_COMMUNITY): Payer: Self-pay | Admitting: *Deleted

## 2016-09-19 DIAGNOSIS — I10 Essential (primary) hypertension: Secondary | ICD-10-CM | POA: Diagnosis not present

## 2016-09-19 DIAGNOSIS — Z8042 Family history of malignant neoplasm of prostate: Secondary | ICD-10-CM | POA: Insufficient documentation

## 2016-09-19 DIAGNOSIS — H409 Unspecified glaucoma: Secondary | ICD-10-CM | POA: Diagnosis not present

## 2016-09-19 DIAGNOSIS — Z794 Long term (current) use of insulin: Secondary | ICD-10-CM | POA: Diagnosis not present

## 2016-09-19 DIAGNOSIS — N3020 Other chronic cystitis without hematuria: Secondary | ICD-10-CM | POA: Diagnosis present

## 2016-09-19 DIAGNOSIS — J449 Chronic obstructive pulmonary disease, unspecified: Secondary | ICD-10-CM | POA: Insufficient documentation

## 2016-09-19 DIAGNOSIS — Z888 Allergy status to other drugs, medicaments and biological substances status: Secondary | ICD-10-CM | POA: Diagnosis not present

## 2016-09-19 DIAGNOSIS — N312 Flaccid neuropathic bladder, not elsewhere classified: Secondary | ICD-10-CM | POA: Diagnosis not present

## 2016-09-19 DIAGNOSIS — E785 Hyperlipidemia, unspecified: Secondary | ICD-10-CM | POA: Insufficient documentation

## 2016-09-19 DIAGNOSIS — Z87891 Personal history of nicotine dependence: Secondary | ICD-10-CM | POA: Insufficient documentation

## 2016-09-19 DIAGNOSIS — Z881 Allergy status to other antibiotic agents status: Secondary | ICD-10-CM | POA: Diagnosis not present

## 2016-09-19 DIAGNOSIS — Z79899 Other long term (current) drug therapy: Secondary | ICD-10-CM | POA: Diagnosis not present

## 2016-09-19 DIAGNOSIS — Z841 Family history of disorders of kidney and ureter: Secondary | ICD-10-CM | POA: Diagnosis not present

## 2016-09-19 DIAGNOSIS — E039 Hypothyroidism, unspecified: Secondary | ICD-10-CM | POA: Diagnosis not present

## 2016-09-19 DIAGNOSIS — Z880 Allergy status to penicillin: Secondary | ICD-10-CM | POA: Insufficient documentation

## 2016-09-19 DIAGNOSIS — I452 Bifascicular block: Secondary | ICD-10-CM | POA: Diagnosis not present

## 2016-09-19 DIAGNOSIS — I4510 Unspecified right bundle-branch block: Secondary | ICD-10-CM | POA: Diagnosis not present

## 2016-09-19 DIAGNOSIS — R339 Retention of urine, unspecified: Secondary | ICD-10-CM | POA: Diagnosis not present

## 2016-09-19 DIAGNOSIS — Z8051 Family history of malignant neoplasm of kidney: Secondary | ICD-10-CM | POA: Insufficient documentation

## 2016-09-19 DIAGNOSIS — Z7982 Long term (current) use of aspirin: Secondary | ICD-10-CM | POA: Insufficient documentation

## 2016-09-19 DIAGNOSIS — E119 Type 2 diabetes mellitus without complications: Secondary | ICD-10-CM | POA: Insufficient documentation

## 2016-09-19 HISTORY — DX: Heart failure, unspecified: I50.9

## 2016-09-19 HISTORY — PX: CYSTOSTOMY: SHX155

## 2016-09-19 HISTORY — DX: Dehydration: E86.0

## 2016-09-19 HISTORY — DX: Unspecified lack of coordination: R27.9

## 2016-09-19 HISTORY — DX: Disorder of the autonomic nervous system, unspecified: G90.9

## 2016-09-19 HISTORY — DX: Gastro-esophageal reflux disease without esophagitis: K21.9

## 2016-09-19 HISTORY — DX: Depression, unspecified: F32.A

## 2016-09-19 HISTORY — DX: Other chronic pain: G89.29

## 2016-09-19 HISTORY — DX: Major depressive disorder, single episode, unspecified: F32.9

## 2016-09-19 HISTORY — DX: Anxiety disorder, unspecified: F41.9

## 2016-09-19 HISTORY — DX: Constipation, unspecified: K59.00

## 2016-09-19 HISTORY — DX: Allergic rhinitis, unspecified: J30.9

## 2016-09-19 HISTORY — DX: Urinary tract infection, site not specified: N39.0

## 2016-09-19 HISTORY — DX: Anemia, unspecified: D64.9

## 2016-09-19 LAB — GLUCOSE, CAPILLARY
Glucose-Capillary: 72 mg/dL (ref 65–99)
Glucose-Capillary: 101 mg/dL — ABNORMAL HIGH (ref 65–99)

## 2016-09-19 SURGERY — CREATION, CYSTOSTOMY, SUPRAPUBIC
Anesthesia: General

## 2016-09-19 MED ORDER — CIPROFLOXACIN IN D5W 400 MG/200ML IV SOLN
400.0000 mg | INTRAVENOUS | Status: AC
  Administered 2016-09-19: 400 mg via INTRAVENOUS

## 2016-09-19 MED ORDER — CIPROFLOXACIN IN D5W 400 MG/200ML IV SOLN
INTRAVENOUS | Status: AC
  Filled 2016-09-19: qty 200

## 2016-09-19 MED ORDER — LACTATED RINGERS IV SOLN
INTRAVENOUS | Status: DC | PRN
  Administered 2016-09-19: 09:00:00 via INTRAVENOUS

## 2016-09-19 MED ORDER — BUPIVACAINE HCL (PF) 0.5 % IJ SOLN
INTRAMUSCULAR | Status: AC
  Filled 2016-09-19: qty 30

## 2016-09-19 MED ORDER — PROPOFOL 10 MG/ML IV BOLUS
INTRAVENOUS | Status: AC
  Filled 2016-09-19: qty 20

## 2016-09-19 MED ORDER — CIPROFLOXACIN HCL 500 MG PO TABS: 500.0000 mg | ORAL_TABLET | Freq: Two times a day (BID) | ORAL | 0 refills | Status: DC

## 2016-09-19 MED ORDER — EPHEDRINE SULFATE 50 MG/ML IJ SOLN
INTRAMUSCULAR | Status: DC | PRN
  Administered 2016-09-19: 5 mg via INTRAVENOUS
  Administered 2016-09-19: 10 mg via INTRAVENOUS

## 2016-09-19 MED ORDER — ONDANSETRON HCL 4 MG/2ML IJ SOLN: 4.0000 mg | Freq: Once | INTRAMUSCULAR | Status: DC | PRN

## 2016-09-19 MED ORDER — ONDANSETRON HCL 4 MG/2ML IJ SOLN
INTRAMUSCULAR | Status: DC | PRN
  Administered 2016-09-19: 4 mg via INTRAVENOUS

## 2016-09-19 MED ORDER — BUPIVACAINE HCL (PF) 0.5 % IJ SOLN
INTRAMUSCULAR | Status: DC | PRN
  Administered 2016-09-19: 10 mL

## 2016-09-19 MED ORDER — FENTANYL CITRATE (PF) 100 MCG/2ML IJ SOLN
INTRAMUSCULAR | Status: AC
  Filled 2016-09-19: qty 2

## 2016-09-19 MED ORDER — PROPOFOL 10 MG/ML IV BOLUS
INTRAVENOUS | Status: DC | PRN
  Administered 2016-09-19: 100 mg via INTRAVENOUS

## 2016-09-19 MED ORDER — LACTATED RINGERS IV SOLN: INTRAVENOUS | Status: DC

## 2016-09-19 MED ORDER — FENTANYL CITRATE (PF) 100 MCG/2ML IJ SOLN: 25.0000 ug | INTRAMUSCULAR | Status: DC | PRN

## 2016-09-19 MED ORDER — TRAMADOL HCL 50 MG PO TABS: 50.0000 mg | ORAL_TABLET | Freq: Four times a day (QID) | ORAL | 0 refills | Status: DC | PRN

## 2016-09-19 MED ORDER — FENTANYL CITRATE (PF) 100 MCG/2ML IJ SOLN
INTRAMUSCULAR | Status: DC | PRN
  Administered 2016-09-19 (×2): 25 ug via INTRAVENOUS

## 2016-09-19 MED ORDER — LIDOCAINE 2% (20 MG/ML) 5 ML SYRINGE
INTRAMUSCULAR | Status: AC
  Filled 2016-09-19: qty 5

## 2016-09-19 MED ORDER — ONDANSETRON HCL 4 MG/2ML IJ SOLN
INTRAMUSCULAR | Status: AC
  Filled 2016-09-19: qty 2

## 2016-09-19 MED ORDER — GLYCOPYRROLATE 0.2 MG/ML IJ SOLN
INTRAMUSCULAR | Status: DC | PRN
  Administered 2016-09-19: 0.2 mg via INTRAVENOUS

## 2016-09-19 MED ORDER — LIDOCAINE 2% (20 MG/ML) 5 ML SYRINGE
INTRAMUSCULAR | Status: DC | PRN
  Administered 2016-09-19: 50 mg via INTRAVENOUS

## 2016-09-19 MED ORDER — ATROPINE SULFATE 0.4 MG/ML IJ SOLN
INTRAMUSCULAR | Status: DC | PRN
  Administered 2016-09-19: .1 mg via INTRAVENOUS

## 2016-09-19 MED ORDER — STERILE WATER FOR IRRIGATION IR SOLN
Status: DC | PRN
  Administered 2016-09-19: 3000 mL

## 2016-09-19 SURGICAL SUPPLY — 28 items
BAG URINE DRAINAGE (UROLOGICAL SUPPLIES) IMPLANT
BAG URINE LEG 500ML (DRAIN) IMPLANT
BLADE CLIPPER SURG (BLADE) IMPLANT
BLADE SURG 15 STRL LF DISP TIS (BLADE) ×1 IMPLANT
BLADE SURG 15 STRL SS (BLADE) ×1
CATH BONANNO SUPRAPUBIC 14G (CATHETERS) IMPLANT
CATH FOLEY 2WAY SLVR  5CC 16FR (CATHETERS) ×1
CATH FOLEY 2WAY SLVR 5CC 16FR (CATHETERS) ×1 IMPLANT
CATH FOLEY INTRO SUPRA 16F (CATHETERS) ×2 IMPLANT
CLOTH BEACON ORANGE TIMEOUT ST (SAFETY) ×2 IMPLANT
COVER SURGICAL LIGHT HANDLE (MISCELLANEOUS) ×2 IMPLANT
DRSG TEGADERM 4X4.75 (GAUZE/BANDAGES/DRESSINGS) ×4 IMPLANT
ELECT PENCIL ROCKER SW 15FT (MISCELLANEOUS) IMPLANT
ELECT REM PT RETURN 15FT ADLT (MISCELLANEOUS) IMPLANT
GAUZE SPONGE 4X4 12PLY STRL (GAUZE/BANDAGES/DRESSINGS) ×2 IMPLANT
GAUZE SPONGE 4X4 16PLY XRAY LF (GAUZE/BANDAGES/DRESSINGS) ×2 IMPLANT
GLOVE BIOGEL M 8.0 STRL (GLOVE) ×2 IMPLANT
GOWN STRL REUS W/TWL XL LVL3 (GOWN DISPOSABLE) ×2 IMPLANT
MANIFOLD NEPTUNE II (INSTRUMENTS) ×2 IMPLANT
NEEDLE HYPO 22GX1.5 SAFETY (NEEDLE) IMPLANT
NS IRRIG 1000ML POUR BTL (IV SOLUTION) IMPLANT
PACK CYSTO (CUSTOM PROCEDURE TRAY) ×2 IMPLANT
PLUG CATH AND CAP STER (CATHETERS) IMPLANT
SUT SILK 2 0 30  PSL (SUTURE) ×1
SUT SILK 2 0 30 PSL (SUTURE) ×1 IMPLANT
SYR CONTROL 10ML LL (SYRINGE) IMPLANT
TOWEL OR 17X26 10 PK STRL BLUE (TOWEL DISPOSABLE) ×4 IMPLANT
WATER STERILE IRR 3000ML UROMA (IV SOLUTION) ×2 IMPLANT

## 2016-09-19 NOTE — Op Note (Signed)
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 Cystoscopy 2. Suprapubic tube placement  SURGEON:  Claybon Jabs  INDICATION: [name] is a 82 year old female with a hypotonic bladder. She cannot perform CIC. She has been bothered by urethral Foley catheter and would like to be converted to a suprapubic tube. She presents today for SP tube placement. She has been on the operative antibiotics.  ANESTHESIA:  General  EBL:  Minimal  DRAINS: 49 French Foley catheter as a suprapubic tube  LOCAL MEDICATIONS USED:  1/2% Marcaine  SPECIMEN:  None  Description of procedure: After informed consent the patient was taken to the operating room and placed on the table in a supine position. General anesthesia was then administered. Once fully anesthetized the patient was moved to the dorsal lithotomy position and the genitalia were sterilely prepped and draped in standard fashion. An official timeout was then performed.  The 38 French cystoscope with 30 lens was passed into the bladder. The bladder was fully and systematically inspected. There were no tumors, stones or inflammatory lesions. Ureteral orifices were noted to be of normal configuration and position.  The bladder was filled to capacity and I palpated a location approximately 2 fingerbreadths above the symphysis pubis and noted that this resulted in an indentation at the dome of the bladder wall where the bottle was located. This was marked with a surgical marker and the patient was then placed in Trendelenburg position. Local anesthesia was then used to infiltrate the skin and subcutaneous tissue. I then made a transverse incision in the skin and passed the trocar suprapubic tube introducer through the skin incision and through the dome of the bladder under direct cystoscopic visualization. The inner  trocar of the introducer was removed and the 16 French Foley catheter was then passed through the sheath into the bladder. The peel-away sheath was then removed and the catheter balloon was filled with 10 mL of water. It was pulled up against the dome of the bladder and there was no active bleeding noted.  The cystoscope was removed and attention was directed to the suprapubic incision which was closed around the catheter with a figure of 8 2-0 silk suture which was also secured to the catheter. Dry gauze dressing was applied as well as a Tegaderm and the patient was awakened and taken to recovery room in stable and satisfactory condition. She tolerated procedure well and there were no intraoperative complications.  PLAN OF CARE: Discharge to home after PACU  PATIENT DISPOSITION:  PACU - hemodynamically stable.

## 2016-09-19 NOTE — Transfer of Care (Signed)
Immediate Anesthesia Transfer of Care Note  Patient: [name]  Procedure(s) Performed: Procedure(s): CYSTOSCOPY WITH SUPRAPUBIC TUBE PLACEMENT (N/A)  Patient Location: PACU  Anesthesia Type:General  Level of Consciousness:  sedated, patient cooperative and responds to stimulation  Airway & Oxygen Therapy:Patient Spontanous Breathing and Patient connected to face mask oxgen  Post-op Assessment:  Report given to PACU RN and Post -op Vital signs reviewed and stable  Post vital signs:  Reviewed and stable  Last Vitals:  Vitals:   09/19/16 0701  BP: (!) 160/43  Pulse: (!) 50  Resp: 18  Temp: 97.4 C    Complications: No apparent anesthesia complications

## 2016-09-19 NOTE — Anesthesia Postprocedure Evaluation (Addendum)
Anesthesia Post Note  Patient: [name]  Procedure(s) Performed: Procedure(s) (LRB): CYSTOSCOPY WITH SUPRAPUBIC TUBE PLACEMENT (N/A)  Patient location during evaluation: PACU Anesthesia Type: General Level of consciousness: awake, awake and alert and oriented Pain management: pain level controlled Vital Signs Assessment: post-procedure vital signs reviewed and stable Respiratory status: spontaneous breathing, nonlabored ventilation and respiratory function stable Cardiovascular status: blood pressure returned to baseline Anesthetic complications: no       Last Vitals:  Vitals:   09/19/16 1115 09/19/16 1145  BP: (!) 148/37 (!) 148/56  Pulse: (!) 58 (!) 56  Resp: 14 16  Temp: 36.3 C 36.2 C    Last Pain:  Vitals:   09/19/16 1145  TempSrc: Oral  PainSc:                  Niam Nepomuceno COKER

## 2016-09-19 NOTE — Anesthesia Preprocedure Evaluation (Signed)
Anesthesia Evaluation  Patient identified by MRN, date of birth, ID band Patient awake    Reviewed: Allergy & Precautions, NPO status , Patient's Chart, lab work & pertinent test results  Airway Mallampati: II       Dental  (+) Edentulous Upper, Edentulous Lower   Pulmonary former smoker,    breath sounds clear to auscultation       Cardiovascular hypertension,  Rhythm:Regular Rate:Normal     Neuro/Psych    GI/Hepatic   Endo/Other  diabetes  Renal/GU      Musculoskeletal   Abdominal   Peds  Hematology   Anesthesia Other Findings   Reproductive/Obstetrics                             Anesthesia Physical Anesthesia Plan  ASA: III  Anesthesia Plan: General   Post-op Pain Management:    Induction: Intravenous  Airway Management Planned: LMA  Additional Equipment:   Intra-op Plan:   Post-operative Plan:   Informed Consent: I have reviewed the patients History and Physical, chart, labs and discussed the procedure including the risks, benefits and alternatives for the proposed anesthesia with the patient or authorized representative who has indicated his/her understanding and acceptance.     Plan Discussed with: CRNA and Anesthesiologist  Anesthesia Plan Comments:         Anesthesia Quick Evaluation

## 2016-09-19 NOTE — Anesthesia Procedure Notes (Signed)
Procedure Name: LMA Insertion Date/Time: 09/19/2016 9:29 AM Performed by: Maxwell Caul Pre-anesthesia Checklist: Patient identified, Emergency Drugs available, Suction available and Patient being monitored Patient Re-evaluated:Patient Re-evaluated prior to inductionOxygen Delivery Method: Circle system utilized Preoxygenation: Pre-oxygenation with 100% oxygen Intubation Type: IV induction Ventilation: Mask ventilation without difficulty LMA: LMA inserted LMA Size: 4.0 Number of attempts: 1 Placement Confirmation: positive ETCO2 and breath sounds checked- equal and bilateral Tube secured with: Tape Dental Injury: Teeth and Oropharynx as per pre-operative assessment

## 2016-09-20 ENCOUNTER — Encounter (HOSPITAL_COMMUNITY): Payer: Self-pay | Admitting: Urology

## 2016-09-20 LAB — HEMOGLOBIN A1C
HEMOGLOBIN A1C: 7.5 % — AB (ref 4.8–5.6)
MEAN PLASMA GLUCOSE: 169 mg/dL

## 2016-10-24 ENCOUNTER — Encounter (HOSPITAL_BASED_OUTPATIENT_CLINIC_OR_DEPARTMENT_OTHER): Payer: Medicare Other | Attending: Internal Medicine

## 2016-10-24 DIAGNOSIS — E1122 Type 2 diabetes mellitus with diabetic chronic kidney disease: Secondary | ICD-10-CM | POA: Insufficient documentation

## 2016-10-24 DIAGNOSIS — E1151 Type 2 diabetes mellitus with diabetic peripheral angiopathy without gangrene: Secondary | ICD-10-CM | POA: Diagnosis not present

## 2016-10-24 DIAGNOSIS — E1140 Type 2 diabetes mellitus with diabetic neuropathy, unspecified: Secondary | ICD-10-CM | POA: Insufficient documentation

## 2016-10-24 DIAGNOSIS — L97529 Non-pressure chronic ulcer of other part of left foot with unspecified severity: Secondary | ICD-10-CM | POA: Insufficient documentation

## 2016-10-24 DIAGNOSIS — I129 Hypertensive chronic kidney disease with stage 1 through stage 4 chronic kidney disease, or unspecified chronic kidney disease: Secondary | ICD-10-CM | POA: Diagnosis not present

## 2016-10-24 DIAGNOSIS — N183 Chronic kidney disease, stage 3 (moderate): Secondary | ICD-10-CM | POA: Insufficient documentation

## 2016-10-24 DIAGNOSIS — Z794 Long term (current) use of insulin: Secondary | ICD-10-CM | POA: Diagnosis not present

## 2016-10-24 DIAGNOSIS — I2510 Atherosclerotic heart disease of native coronary artery without angina pectoris: Secondary | ICD-10-CM | POA: Diagnosis not present

## 2016-10-24 DIAGNOSIS — J449 Chronic obstructive pulmonary disease, unspecified: Secondary | ICD-10-CM | POA: Insufficient documentation

## 2016-10-24 DIAGNOSIS — Z8673 Personal history of transient ischemic attack (TIA), and cerebral infarction without residual deficits: Secondary | ICD-10-CM | POA: Insufficient documentation

## 2016-10-24 DIAGNOSIS — E11621 Type 2 diabetes mellitus with foot ulcer: Secondary | ICD-10-CM | POA: Diagnosis not present

## 2016-11-07 DIAGNOSIS — E11621 Type 2 diabetes mellitus with foot ulcer: Secondary | ICD-10-CM | POA: Diagnosis not present

## 2016-11-29 ENCOUNTER — Other Ambulatory Visit: Payer: Self-pay | Admitting: Internal Medicine

## 2016-11-29 ENCOUNTER — Inpatient Hospital Stay (HOSPITAL_COMMUNITY): Admission: RE | Admit: 2016-11-29 | Payer: Medicare Other | Source: Ambulatory Visit

## 2016-11-29 ENCOUNTER — Encounter (HOSPITAL_COMMUNITY): Payer: Medicare Other

## 2016-11-29 DIAGNOSIS — S81809A Unspecified open wound, unspecified lower leg, initial encounter: Secondary | ICD-10-CM

## 2016-12-15 ENCOUNTER — Ambulatory Visit (HOSPITAL_COMMUNITY)
Admission: RE | Admit: 2016-12-15 | Discharge: 2016-12-15 | Disposition: A | Discharge status: Home / Self Care | Payer: Medicare Other | Source: Ambulatory Visit | Attending: Vascular Surgery | Admitting: Vascular Surgery

## 2016-12-15 DIAGNOSIS — E119 Type 2 diabetes mellitus without complications: Secondary | ICD-10-CM | POA: Diagnosis not present

## 2016-12-15 DIAGNOSIS — S81809A Unspecified open wound, unspecified lower leg, initial encounter: Secondary | ICD-10-CM | POA: Diagnosis not present

## 2016-12-15 DIAGNOSIS — I10 Essential (primary) hypertension: Secondary | ICD-10-CM | POA: Insufficient documentation

## 2016-12-15 DIAGNOSIS — R938 Abnormal findings on diagnostic imaging of other specified body structures: Secondary | ICD-10-CM | POA: Diagnosis not present

## 2016-12-15 DIAGNOSIS — E785 Hyperlipidemia, unspecified: Secondary | ICD-10-CM | POA: Diagnosis not present

## 2016-12-15 DIAGNOSIS — X58XXXA Exposure to other specified factors, initial encounter: Secondary | ICD-10-CM | POA: Insufficient documentation

## 2016-12-15 DIAGNOSIS — Z87891 Personal history of nicotine dependence: Secondary | ICD-10-CM | POA: Diagnosis not present

## 2016-12-15 LAB — VAS US LOWER EXTREMITY ARTERIAL DUPLEX
RIGHT ANT DIST TIBAL SYS PSV: 24 cm/s
RIGHT POST TIB DIST SYS: -36 cm/s
RPERPSV: 15 cm/s
RSFPPSV: 114 cm/s
Right super femoral dist sys PSV: 348 cm/s
Right super femoral mid sys PSV: -141 cm/s

## 2017-01-05 NOTE — Addendum Note (Signed)
Addendum  created 01/05/17 1410 by Roberts Gaudy, MD   Sign clinical note

## 2017-01-31 ENCOUNTER — Ambulatory Visit (INDEPENDENT_AMBULATORY_CARE_PROVIDER_SITE_OTHER): Payer: Medicare Other | Admitting: Podiatry

## 2017-01-31 ENCOUNTER — Encounter: Payer: Self-pay | Admitting: Podiatry

## 2017-01-31 VITALS — BP 152/65 | HR 62

## 2017-01-31 DIAGNOSIS — M79675 Pain in left toe(s): Secondary | ICD-10-CM

## 2017-01-31 DIAGNOSIS — E1142 Type 2 diabetes mellitus with diabetic polyneuropathy: Secondary | ICD-10-CM

## 2017-01-31 DIAGNOSIS — M79674 Pain in right toe(s): Secondary | ICD-10-CM

## 2017-01-31 DIAGNOSIS — B351 Tinea unguium: Secondary | ICD-10-CM

## 2017-01-31 NOTE — Progress Notes (Signed)
   Subjective:    Patient ID: [name], female    DOB: 1934/12/13, 81 y.o.   MRN: [id number]  HPI this patient presents the office for diabetic foot exam and care.  She is brought to the office by her daughter.  Her daughter states that her mother is staying at [facility] and has taken pictures of her long nails.  She says there is a podiatrist on staff, but obviously he is not helping her mother.  She states the nails are extremely long and she herself attempted to trim her mothers nails. She says she cut her mothers nails herself and realized she needs to be seen in this office She has taken pictures of the long nails.  She also is concerned about a skin lesion that noted on the left big toe left foot.  This area is mildly red with no drainage noted.  She presents the office today for an evaluation and treatment of her diabetic feet    Review of Systems  All other systems reviewed and are negative.      Objective:   Physical Exam GENERAL APPEARANCE: Alert, conversant. Appropriately groomed. No acute distress.  VASCULAR: Pedal pulses are  palpable at  [facility] and PT bilateral.  Capillary refill time is immediate to all digits,  Normal temperature gradient.   NEUROLOGIC: LOPS  is diminished right foot.  LOPS absent left foot. to 5.07 monofilament at 5/5 sites bilateral.   Muscle strength normal.  MUSCULOSKELETAL: acceptable muscle strength, tone and stability bilateral.  Intrinsic muscluature intact bilateral.  Rectus appearance of foot and digits noted bilateral. 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 NAILS  thick disfigured discolored, elongated toenails, both feet.  The left hallux toenail is thickened and disfigured and is present on the nail bed . No redness, swelling or pus is noted at the site of the nail. DERMATOLOGIC: skin color, texture, and turgor are within normal limits.  There is a skin lesion noted on the dorsal medial aspect of the IPJ left hallux.  No infection or drainage  noted.  The distal aspect of the left hallux also has a start of the skin lesion in the absence of an infection.           Assessment & Plan:   Onychomycosis  B/L   IE.  Debridement of long thick painful nails.  Attention was directed to the toenail on the left hallux where the nail plate was totally removed since it was minimally attached to the nailbed.  The skin lesion on the left hallux was bandaged with Neosporin and dry sterile dressing.  The border was instructed to soak the mothers feet daily and bandaged the toe as needed.  The patient's daughter was then told to return to the office if the skin lesion worsens. Otherwise, return to the clinic in 10 weeks for further evaluation and treatment   Gardiner Barefoot DPM

## 2017-04-12 ENCOUNTER — Ambulatory Visit (INDEPENDENT_AMBULATORY_CARE_PROVIDER_SITE_OTHER): Payer: Medicare Other | Admitting: Podiatry

## 2017-04-12 ENCOUNTER — Other Ambulatory Visit: Payer: Self-pay | Admitting: Podiatry

## 2017-04-12 ENCOUNTER — Encounter: Payer: Self-pay | Admitting: Podiatry

## 2017-04-12 ENCOUNTER — Ambulatory Visit (INDEPENDENT_AMBULATORY_CARE_PROVIDER_SITE_OTHER): Payer: Medicare Other

## 2017-04-12 DIAGNOSIS — L02612 Cutaneous abscess of left foot: Secondary | ICD-10-CM

## 2017-04-12 DIAGNOSIS — B351 Tinea unguium: Secondary | ICD-10-CM

## 2017-04-12 DIAGNOSIS — L97301 Non-pressure chronic ulcer of unspecified ankle limited to breakdown of skin: Secondary | ICD-10-CM

## 2017-04-12 DIAGNOSIS — E11622 Type 2 diabetes mellitus with other skin ulcer: Secondary | ICD-10-CM | POA: Diagnosis not present

## 2017-04-12 DIAGNOSIS — L03032 Cellulitis of left toe: Secondary | ICD-10-CM | POA: Diagnosis not present

## 2017-04-12 DIAGNOSIS — R52 Pain, unspecified: Secondary | ICD-10-CM

## 2017-04-12 DIAGNOSIS — M79675 Pain in left toe(s): Secondary | ICD-10-CM

## 2017-04-12 DIAGNOSIS — M79674 Pain in right toe(s): Secondary | ICD-10-CM

## 2017-04-12 MED ORDER — DOXYCYCLINE HYCLATE 100 MG PO TABS: 100.0000 mg | ORAL_TABLET | Freq: Two times a day (BID) | ORAL | 0 refills | Status: DC

## 2017-04-13 NOTE — Progress Notes (Signed)
This patient presents the office for continued diabetic foot care.  She was seen approximately 10 weeks ago for preventative foot care services.  She presents today saying that her nails are thick and long and are painful walking and wearing her shoes.  She also says she has a skin lesion on the top of the big toe left foot.  She says this area is extremely painful to the touch.  She says this skin lesion has been open and closing for over 1 year and continues to open.  She presents the office today for diabetic foot care for the treatment of her nails and for an evaluation of her open skin lesion left big toe.  General Appearance  Alert, conversant and in no acute stress.  Vascular  Dorsalis pedis and posterior pulses are palpable  bilaterally.  Capillary return is within normal limits  Bilaterally. Temperature is within normal limits  Bilaterally  Neurologic  Senn-Weinstein monofilament wire test diminished  bilaterally. Muscle power  within normal limits bilaterally.  Nails Thick disfigured discolored nails with subungual debris bilaterally from hallux to fifth toes bilaterally. No evidence of bacterial infection or drainage bilaterally.  Orthopedic  No limitations of motion of motion feet bilaterally.  No crepitus or effusions noted.  No bony pathology or digital deformities noted. DJD midfoot  B/L  Hallux malleus IPJ left hallux.  Skin  normotropic skin with no porokeratosis noted bilaterally.  There is a skin lesion noted on the dorsomedial aspect of the IPJ left hallux.  There is increased redness and swelling locally around the diabetic ulcer site.  The distal aspect of the left hallux also has a diabetic ulcer in the absence of any infection.    Onychomycoses bilaterally.   Infected diabetic ulcer left hallux  Treatment.  Debridement of nails 10.  Debridement of infected skin at the 2 separate ulcers sites.  Neosporin and dry sterile dressing was applied.  X-rays taken reveal no bony  pathology left hallux.  Patient was told to continue home soaks and to take doxycycline. For the infection .  RTC 2 weeks.   Gardiner Barefoot DPM

## 2017-04-28 ENCOUNTER — Encounter: Payer: Self-pay | Admitting: Podiatry

## 2017-04-28 ENCOUNTER — Ambulatory Visit (INDEPENDENT_AMBULATORY_CARE_PROVIDER_SITE_OTHER): Payer: Medicare Other | Admitting: Podiatry

## 2017-04-28 VITALS — BP 143/58 | HR 58

## 2017-04-28 DIAGNOSIS — L02612 Cutaneous abscess of left foot: Secondary | ICD-10-CM

## 2017-04-28 DIAGNOSIS — L03032 Cellulitis of left toe: Secondary | ICD-10-CM

## 2017-04-28 DIAGNOSIS — L97301 Non-pressure chronic ulcer of unspecified ankle limited to breakdown of skin: Secondary | ICD-10-CM | POA: Diagnosis not present

## 2017-04-28 DIAGNOSIS — E11622 Type 2 diabetes mellitus with other skin ulcer: Secondary | ICD-10-CM

## 2017-04-28 MED ORDER — DOXYCYCLINE HYCLATE 100 MG PO TABS: 100.0000 mg | ORAL_TABLET | Freq: Two times a day (BID) | ORAL | 0 refills | Status: DC

## 2017-04-28 NOTE — Progress Notes (Signed)
This patient presents the office for continued evaluation of an infected diabetic ulcer left hallux.  She says that her toe continues to be painful.  She says she has been soaking her foot and bandaging her foot as directed.  She has also been taking doxycycline for the infection.  She presents the office with the ulcer on the top of the toe being open and a persistent redness/pinkish coloration  to the left big toe.  . She also has a blackened skin lesion noted on the tip of the left hallux.  She presents the office today for continued evaluation and treatment of this diabetic ulcer.   General Appearance  Alert, conversant and in no acute stress.  Vascular  Dorsalis pedis and posterior pulses are palpable  bilaterally.  Capillary return is within normal limits  Bilaterally. Temperature is within normal limits  Bilaterally  Neurologic  Senn-Weinstein monofilament wire test diminished  bilaterally. Muscle power  Within normal limits bilaterally.  Nails Thick disfigured discolored nails with subungual debris bilaterally from hallux to fifth toes bilaterally. No evidence of bacterial infection or drainage bilaterally.  Orthopedic  No limitations of motion of motion feet bilaterally.  Hallux malleus left.  DJD midfoot bilaterally  Skin  normotropic skin with no porokeratosis noted bilaterally.  Draining ulcer at the dorsomedial aspect of the IPJ left hallux.  There is continued redness in the absence of swelling left hallux.  There is black necrosis noted at the distal tip of the left hallux.     Diabetic ulcer left hallux.  Skin necrosis left hallux.  Treatment  debridement of the draining ulcer left hallux. Neosporin and dry sterile dressing was applied.  Patient was told to continue home soaks and a refill of the doxycycline was prescribed.  She is to return to the office in 2 weeks for continued evaluation and treatment of her diabetic ulcer.   Gardiner Barefoot DPM

## 2017-05-12 ENCOUNTER — Encounter: Payer: Self-pay | Admitting: Podiatry

## 2017-05-12 ENCOUNTER — Ambulatory Visit (INDEPENDENT_AMBULATORY_CARE_PROVIDER_SITE_OTHER): Payer: Medicare Other | Admitting: Podiatry

## 2017-05-12 DIAGNOSIS — L02612 Cutaneous abscess of left foot: Secondary | ICD-10-CM

## 2017-05-12 DIAGNOSIS — L03032 Cellulitis of left toe: Secondary | ICD-10-CM

## 2017-05-12 DIAGNOSIS — L97511 Non-pressure chronic ulcer of other part of right foot limited to breakdown of skin: Secondary | ICD-10-CM

## 2017-05-12 NOTE — Progress Notes (Signed)
This patient presents the office for continued evaluation of an infected diabetic ulcer left hallux.  She says that her toe continues to be painful.  She says she has been soaking her foot and bandaging her foot as directed.  She has also been taking doxycycline for the infection.  She presents the office with the ulcer on the top of the big toe with black crust noted and  a persistent redness/pinkish coloration  to the left big toe..  She presents the office today for continued evaluation and treatment of this diabetic ulcer.   General Appearance  Alert, conversant and in no acute stress.  Vascular  Dorsalis pedis and posterior pulses are palpable  bilaterally.  Capillary return is within normal limits  Bilaterally. Temperature is within normal limits  Bilaterally  Neurologic  Senn-Weinstein monofilament wire test diminished  bilaterally. Muscle power  Within normal limits bilaterally.  Nails Thick disfigured discolored nails with subungual debris bilaterally from hallux to fifth toes bilaterally. No evidence of bacterial infection or drainage bilaterally.  Orthopedic  No limitations of motion of motion feet bilaterally.  Hallux malleus left.  DJD midfoot bilaterally  Skin  normotropic skin with no porokeratosis noted bilaterally.  Draining ulcer at the dorsomedial aspect of the IPJ left hallux.  The ulcer measures 5 mm x 7 mm.  Pus exuded from under black crust covering the diabetic ulcer. There is continued redness in the absence of swelling left hallux.  Healed skin necrosis left hallux.   Diabetic ulcer left hallux.  .  Treatment  debridement of the draining ulcer left hallux. Neosporin and dry sterile dressing was applied.  Patient was told to continue home soaks and a refill of the doxycycline was prescribed.  She is to return to the office in 3 weeks for continued evaluation and treatment of her diabetic ulcer. The facility appears to be helping with soaks and bandaging.   Gardiner Barefoot  DPM

## 2017-06-02 ENCOUNTER — Other Ambulatory Visit: Payer: Self-pay

## 2017-06-02 ENCOUNTER — Encounter (HOSPITAL_COMMUNITY): Payer: Self-pay

## 2017-06-02 ENCOUNTER — Emergency Department (HOSPITAL_COMMUNITY): Payer: Medicare Other

## 2017-06-02 ENCOUNTER — Inpatient Hospital Stay (HOSPITAL_COMMUNITY)
Admission: EM | Admit: 2017-06-02 | Discharge: 2017-06-06 | DRG: 313 | Disposition: A | Discharge status: Skilled Nursing Facility | Payer: Medicare Other | Attending: Internal Medicine | Admitting: Internal Medicine

## 2017-06-02 ENCOUNTER — Encounter (HOSPITAL_COMMUNITY)
Admission: EM | Disposition: A | Discharge status: Skilled Nursing Facility | Payer: Self-pay | Source: Home / Self Care | Attending: Internal Medicine

## 2017-06-02 DIAGNOSIS — Z79899 Other long term (current) drug therapy: Secondary | ICD-10-CM

## 2017-06-02 DIAGNOSIS — E11649 Type 2 diabetes mellitus with hypoglycemia without coma: Secondary | ICD-10-CM | POA: Diagnosis not present

## 2017-06-02 DIAGNOSIS — Z888 Allergy status to other drugs, medicaments and biological substances status: Secondary | ICD-10-CM

## 2017-06-02 DIAGNOSIS — Z833 Family history of diabetes mellitus: Secondary | ICD-10-CM

## 2017-06-02 DIAGNOSIS — E1122 Type 2 diabetes mellitus with diabetic chronic kidney disease: Secondary | ICD-10-CM | POA: Diagnosis not present

## 2017-06-02 DIAGNOSIS — Z79891 Long term (current) use of opiate analgesic: Secondary | ICD-10-CM

## 2017-06-02 DIAGNOSIS — I5032 Chronic diastolic (congestive) heart failure: Secondary | ICD-10-CM | POA: Diagnosis not present

## 2017-06-02 DIAGNOSIS — H3530 Unspecified macular degeneration: Secondary | ICD-10-CM | POA: Diagnosis not present

## 2017-06-02 DIAGNOSIS — I4510 Unspecified right bundle-branch block: Secondary | ICD-10-CM | POA: Diagnosis present

## 2017-06-02 DIAGNOSIS — L8990 Pressure ulcer of unspecified site, unspecified stage: Secondary | ICD-10-CM | POA: Diagnosis not present

## 2017-06-02 DIAGNOSIS — I2510 Atherosclerotic heart disease of native coronary artery without angina pectoris: Secondary | ICD-10-CM | POA: Diagnosis present

## 2017-06-02 DIAGNOSIS — J449 Chronic obstructive pulmonary disease, unspecified: Secondary | ICD-10-CM | POA: Diagnosis present

## 2017-06-02 DIAGNOSIS — E1142 Type 2 diabetes mellitus with diabetic polyneuropathy: Secondary | ICD-10-CM | POA: Diagnosis not present

## 2017-06-02 DIAGNOSIS — R079 Chest pain, unspecified: Secondary | ICD-10-CM | POA: Diagnosis present

## 2017-06-02 DIAGNOSIS — E7800 Pure hypercholesterolemia, unspecified: Secondary | ICD-10-CM | POA: Diagnosis not present

## 2017-06-02 DIAGNOSIS — J309 Allergic rhinitis, unspecified: Secondary | ICD-10-CM | POA: Diagnosis present

## 2017-06-02 DIAGNOSIS — Z993 Dependence on wheelchair: Secondary | ICD-10-CM

## 2017-06-02 DIAGNOSIS — K219 Gastro-esophageal reflux disease without esophagitis: Secondary | ICD-10-CM | POA: Diagnosis not present

## 2017-06-02 DIAGNOSIS — G8929 Other chronic pain: Secondary | ICD-10-CM | POA: Diagnosis not present

## 2017-06-02 DIAGNOSIS — Z79890 Hormone replacement therapy (postmenopausal): Secondary | ICD-10-CM

## 2017-06-02 DIAGNOSIS — D638 Anemia in other chronic diseases classified elsewhere: Secondary | ICD-10-CM | POA: Diagnosis present

## 2017-06-02 DIAGNOSIS — Z880 Allergy status to penicillin: Secondary | ICD-10-CM

## 2017-06-02 DIAGNOSIS — F419 Anxiety disorder, unspecified: Secondary | ICD-10-CM | POA: Diagnosis present

## 2017-06-02 DIAGNOSIS — I130 Hypertensive heart and chronic kidney disease with heart failure and stage 1 through stage 4 chronic kidney disease, or unspecified chronic kidney disease: Secondary | ICD-10-CM | POA: Diagnosis present

## 2017-06-02 DIAGNOSIS — E1151 Type 2 diabetes mellitus with diabetic peripheral angiopathy without gangrene: Secondary | ICD-10-CM | POA: Diagnosis not present

## 2017-06-02 DIAGNOSIS — F329 Major depressive disorder, single episode, unspecified: Secondary | ICD-10-CM | POA: Diagnosis not present

## 2017-06-02 DIAGNOSIS — Z885 Allergy status to narcotic agent status: Secondary | ICD-10-CM

## 2017-06-02 DIAGNOSIS — Z794 Long term (current) use of insulin: Secondary | ICD-10-CM

## 2017-06-02 DIAGNOSIS — Z7982 Long term (current) use of aspirin: Secondary | ICD-10-CM

## 2017-06-02 DIAGNOSIS — Z66 Do not resuscitate: Secondary | ICD-10-CM | POA: Diagnosis not present

## 2017-06-02 DIAGNOSIS — D649 Anemia, unspecified: Secondary | ICD-10-CM | POA: Diagnosis present

## 2017-06-02 DIAGNOSIS — N183 Chronic kidney disease, stage 3 unspecified: Secondary | ICD-10-CM | POA: Diagnosis present

## 2017-06-02 DIAGNOSIS — N390 Urinary tract infection, site not specified: Secondary | ICD-10-CM | POA: Diagnosis present

## 2017-06-02 DIAGNOSIS — R0789 Other chest pain: Secondary | ICD-10-CM | POA: Diagnosis present

## 2017-06-02 DIAGNOSIS — Z961 Presence of intraocular lens: Secondary | ICD-10-CM | POA: Diagnosis present

## 2017-06-02 DIAGNOSIS — Z8673 Personal history of transient ischemic attack (TIA), and cerebral infarction without residual deficits: Secondary | ICD-10-CM

## 2017-06-02 DIAGNOSIS — F17210 Nicotine dependence, cigarettes, uncomplicated: Secondary | ICD-10-CM | POA: Diagnosis not present

## 2017-06-02 DIAGNOSIS — E039 Hypothyroidism, unspecified: Secondary | ICD-10-CM | POA: Diagnosis not present

## 2017-06-02 DIAGNOSIS — Z90710 Acquired absence of both cervix and uterus: Secondary | ICD-10-CM

## 2017-06-02 DIAGNOSIS — I5189 Other ill-defined heart diseases: Secondary | ICD-10-CM | POA: Diagnosis present

## 2017-06-02 DIAGNOSIS — I10 Essential (primary) hypertension: Secondary | ICD-10-CM | POA: Diagnosis present

## 2017-06-02 DIAGNOSIS — E119 Type 2 diabetes mellitus without complications: Secondary | ICD-10-CM

## 2017-06-02 LAB — COMPREHENSIVE METABOLIC PANEL
ALT: 17 U/L (ref 14–54)
ANION GAP: 14 (ref 5–15)
AST: 25 U/L (ref 15–41)
Albumin: 2.5 g/dL — ABNORMAL LOW (ref 3.5–5.0)
Alkaline Phosphatase: 82 U/L (ref 38–126)
BUN: 45 mg/dL — ABNORMAL HIGH (ref 6–20)
CHLORIDE: 103 mmol/L (ref 101–111)
CO2: 18 mmol/L — AB (ref 22–32)
CREATININE: 1.4 mg/dL — AB (ref 0.44–1.00)
Calcium: 9.2 mg/dL (ref 8.9–10.3)
GFR, EST AFRICAN AMERICAN: 40 mL/min — AB (ref >60)
GFR, EST NON AFRICAN AMERICAN: 34 mL/min — AB (ref >60)
Glucose, Bld: 340 mg/dL — ABNORMAL HIGH (ref 65–99)
POTASSIUM: 4.6 mmol/L (ref 3.5–5.1)
Sodium: 135 mmol/L (ref 135–145)
Total Bilirubin: 0.6 mg/dL (ref 0.3–1.2)
Total Protein: 6.3 g/dL — ABNORMAL LOW (ref 6.5–8.1)

## 2017-06-02 LAB — CBC WITH DIFFERENTIAL/PLATELET
BASOS ABS: 0 10*3/uL (ref 0.0–0.1)
BASOS PCT: 0 %
EOS PCT: 1 %
Eosinophils Absolute: 0.1 10*3/uL (ref 0.0–0.7)
HCT: 32.2 % — ABNORMAL LOW (ref 36.0–46.0)
Hemoglobin: 10.2 g/dL — ABNORMAL LOW (ref 12.0–15.0)
LYMPHS PCT: 16 %
Lymphs Abs: 2 10*3/uL (ref 0.7–4.0)
MCH: 28.4 pg (ref 26.0–34.0)
MCHC: 31.7 g/dL (ref 30.0–36.0)
MCV: 89.7 fL (ref 78.0–100.0)
MONO ABS: 1.6 10*3/uL — AB (ref 0.1–1.0)
Monocytes Relative: 12 %
NEUTROS ABS: 9.3 10*3/uL — AB (ref 1.7–7.7)
Neutrophils Relative %: 71 %
PLATELETS: 276 10*3/uL (ref 150–400)
RBC: 3.59 MIL/uL — AB (ref 3.87–5.11)
RDW: 13.3 % (ref 11.5–15.5)
WBC: 13.1 10*3/uL — ABNORMAL HIGH (ref 4.0–10.5)

## 2017-06-02 LAB — LIPID PANEL
CHOL/HDL RATIO: 2.8 ratio
CHOLESTEROL: 116 mg/dL (ref 0–200)
HDL: 42 mg/dL (ref >40)
LDL Cholesterol: 50 mg/dL (ref 0–99)
TRIGLYCERIDES: 118 mg/dL (ref <150)
VLDL: 24 mg/dL (ref 0–40)

## 2017-06-02 LAB — PROTIME-INR
INR: 1.05
PROTHROMBIN TIME: 13.6 s (ref 11.4–15.2)

## 2017-06-02 LAB — I-STAT TROPONIN, ED: Troponin i, poc: 0.01 ng/mL (ref 0.00–0.08)

## 2017-06-02 LAB — APTT: APTT: 31 s (ref 24–36)

## 2017-06-02 LAB — TROPONIN I

## 2017-06-02 SURGERY — CORONARY/GRAFT ACUTE MI REVASCULARIZATION
Anesthesia: LOCAL

## 2017-06-02 MED ORDER — HEPARIN SODIUM (PORCINE) 5000 UNIT/ML IJ SOLN
4000.0000 [IU] | Freq: Once | INTRAMUSCULAR | Status: AC
  Administered 2017-06-02: 4000 [IU] via INTRAVENOUS

## 2017-06-02 MED ORDER — NITROGLYCERIN 0.4 MG SL SUBL: 0.4000 mg | SUBLINGUAL_TABLET | SUBLINGUAL | Status: DC | PRN

## 2017-06-02 MED ORDER — SODIUM CHLORIDE 0.9 % IV SOLN
INTRAVENOUS | Status: DC
  Administered 2017-06-02: 23:00:00 via INTRAVENOUS

## 2017-06-02 NOTE — ED Provider Notes (Addendum)
[facility] EMERGENCY DEPARTMENT Provider Note   CSN: [id number] Arrival date & time: 06/02/17  2220     History   Chief Complaint Chief Complaint  Patient presents with  . Code STEMI    HPI [name] is a 83 y.o. female.  HPI   83 year old female with past medical history as below who presents with chest pain.  The patient states that she developed chest pain earlier this afternoon.  She describes it as a dull substernal chest pain, but also describes it as sharp and positional.  It seems to be reproduced on exam as well.  She reports she does have shortness of breath throughout the day today, as well as some mild leg swelling.  She denies any diaphoresis.  Denies any nausea or vomiting.  She states it does not get better or worse with eating.  She has been taking her medications as prescribed.  No alleviating factors.  Past Medical History:  Diagnosis Date  . Allergic rhinitis   . Anemia    iron deficiency   . Anxiety   . Autonomic neuropathy   . Carotid arterial disease ([place])    a. 09/2013 Carotid U/S: 40-59% bilat ICA stenosis.  . CHF (congestive heart failure) ([place])   . Cholelithiasis   . Chronic pain   . CKD (chronic kidney disease), stage III ([place])   . Constipation   . COPD (chronic obstructive pulmonary disease) ([place])   . Dehydration   . Depression    major depressive disorder  . Diabetic peripheral neuropathy ([place])   . Enterococcus UTI    a. 09/2013.  Marland Kitchen GERD (gastroesophageal reflux disease)   . H/O echocardiogram    a. 09/2013 Echo: nl LV fxn, no rwma, mildly to mod dil LA, mildly dil RA.  Marland Kitchen Headache(784.0)   . Hypercholesteremia   . Hypertension   . Hypothyroid   . Lack of coordination   . Macular degeneration, bilateral   . Non-obstructive CAD    a. 11/2005 Cath: LM nl, LAD 30 diffuse, LCX small, 20p, 69m/d, 75d into OM, RCA dominant, 40p, 30-40 diffuse, EF 55%.  Marland Kitchen PAD (peripheral artery disease) ([place])    a. 1997 s/p R fem-pop  bypass;  b. 03/1999 R CIA stenting.  . Pyelonephritis   . TIA (transient ischemic attack)    a. 09/2013.  . Tobacco abuse   . Type II diabetes mellitus ([place])   . Urinary tract infection     Patient Active Problem List   Diagnosis Date Noted  . Chest pain 06/03/2017  . Chronic diastolic CHF (congestive heart failure) ([place])   . Neck muscle spasm 06/01/2015  . CHF exacerbation ([place]) 10/09/2014  . Near syncope 10/05/2014  . Acute on chronic renal failure ([place]) 10/05/2014  . Tremor of both hands 10/05/2014  . Bradycardia 10/05/2014  . Wax in ear 10/05/2014  . Left ankle strain 08/09/2014  . Protein-calorie malnutrition, severe ([place]) 10/13/2013  . UTI (urinary tract infection) 10/11/2013  . Nausea, vomiting and abdominal pain. 10/11/2013  . Nausea and vomiting 10/11/2013  . TIA (transient ischemic attack) 10/07/2013  . Ulcer of left heel ([place]) 10/07/2013  . Altered mental status 06/20/2013  . Acute encephalopathy 06/20/2013  . Hypoglycemia 06/20/2013  . Renal failure (ARF), acute on chronic (HCC) 06/20/2013  . UTI (lower urinary tract infection) 06/20/2013  . ARF (acute renal failure) ([place]) 10/31/2012  . Hyperkalemia 10/31/2012  . Weakness generalized 10/31/2012  . CKD (chronic kidney disease) stage 3,  GFR 30-59 ml/min ([place]) 10/31/2012  . Fall 10/31/2012  . Nasal laceration 10/31/2012  . Diastolic dysfunction, grade II 01/27/2012  . Acute exacerbation of chronic obstructive pulmonary disease (COPD) ([place]) 01/26/2012  . Hypoxia 01/25/2012  . Elevated serum creatinine 01/25/2012  . Pyelonephritis 01/23/2012  . Lumbago 01/23/2012  . Hypothyroidism 12/01/2008  . Diabetes mellitus without complication ([place]) 63/87/5643  . HYPERCHOLESTEROLEMIA 12/01/2008  . Normocytic anemia 12/01/2008  . Essential hypertension 12/01/2008  . CAD (coronary artery disease) 12/01/2008  . COPD (chronic obstructive pulmonary disease) ([place]) 12/01/2008  . WEAKNESS 12/01/2008  . EDEMA 12/01/2008  .  SHORTNESS OF BREATH 12/01/2008  . DYSPHAGIA UNSPECIFIED 12/01/2008    Past Surgical History:  Procedure Laterality Date  . ABDOMINAL HYSTERECTOMY  1970's  . ANTERIOR CERVICAL DECOMP/DISCECTOMY FUSION  01/02/2006   Archie Endo 01/02/2006 (11/01/2012)  . BYPASS GRAFT Right ~ 1997   RLE by Dr. Gwenlyn Perking 11/29/2005 (11/01/2012)  . CARDIAC CATHETERIZATION  11/29/2005   Archie Endo 11/29/2005 (11/01/2012)  . CATARACT EXTRACTION W/ INTRAOCULAR LENS  IMPLANT, BILATERAL Bilateral ~ 2010  . CYSTOSTOMY N/A 09/19/2016   Procedure: CYSTOSCOPY WITH SUPRAPUBIC TUBE PLACEMENT;  Surgeon: Kathie Rhodes, MD;  Location: [facility];  Service: Urology;  Laterality: N/A;  . CYSTOSTOMY W/ BLADDER BIOPSY  2005   Archie Endo 09/19/2003 (11/01/2012)  . DILATION AND CURETTAGE OF UTERUS  1970's   "probably 2" (11/01/2012)  . ESOPHAGOGASTRODUODENOSCOPY N/A 10/14/2013   Procedure: ESOPHAGOGASTRODUODENOSCOPY (EGD);  Surgeon: Missy Sabins, MD;  Location: [facility];  Service: Endoscopy;  Laterality: N/A;  . SHOULDER OPEN ROTATOR CUFF REPAIR Bilateral 1990's-2000's   "3X on the left; twice on the right" (11/01/2012)  . TONSILLECTOMY  1940's    OB History    No data available       Home Medications    Prior to Admission medications   Medication Sig Start Date End Date Taking? Authorizing Provider  acetaminophen (TYLENOL) 325 MG tablet Take 650 mg by mouth daily as needed for mild pain.    Yes [provider]  acetaminophen (TYLENOL) 500 MG tablet Take 500 mg by mouth 3 (three) times daily.    Yes [provider]  acetic acid 0.25 % irrigation 30 ml's mixed with 30 ml's sterile water irrigate SP cath daily   Yes [provider]  alum & mag hydroxide-simeth (MAALOX/MYLANTA) 200-200-20 MG/5ML suspension Take 30 mLs by mouth every 6 (six) hours as needed for indigestion or heartburn. Do not exceed 4 doses in 24 hours   Yes [provider]  Amino Acids-Protein Hydrolys (FEEDING SUPPLEMENT, PRO-STAT SUGAR FREE  64,) LIQD Take 15 mLs by mouth 2 (two) times daily.   Yes [provider]  amLODipine (NORVASC) 10 MG tablet Take 10 mg by mouth at bedtime.    Yes [provider]  aspirin 81 MG chewable tablet Chew 81 mg by mouth daily.   Yes [provider]  B Complex-C-Folic Acid (VITA-BEE/C) TABS Take 1 tablet by mouth daily.   Yes [provider]  bisacodyl (DULCOLAX) 10 MG suppository Place 10 mg rectally daily as needed for moderate constipation.    Yes [provider]  carvedilol (COREG) 6.25 MG tablet Take 1 tablet (6.25 mg total) by mouth 2 (two) times daily with a meal. 10/13/14  Yes Rai, Ripudeep K, MD  cloNIDine (CATAPRES) 0.1 MG tablet Take 0.1 mg by mouth 2 (two) times daily. Hold for SBP<110   Yes [provider]  Cranberry 250 MG TABS Take 1,000 mg by mouth  2 (two) times daily.   Yes [provider]  docusate sodium (COLACE) 100 MG capsule Take 1 capsule (100 mg total) by mouth every 12 (twelve) hours. 08/07/14  Yes Ward, Kristen N, DO  escitalopram (LEXAPRO) 5 MG tablet Take 5 mg by mouth daily.   Yes [provider]  furosemide (LASIX) 20 MG tablet Take 20 mg by mouth daily.   Yes [provider]  gabapentin (NEURONTIN) 300 MG capsule Take 300 mg by mouth 3 (three) times daily.    Yes [provider]  HYDROcodone-acetaminophen (NORCO/VICODIN) 5-325 MG tablet Take 1 tablet by mouth every 6 (six) hours as needed for moderate pain or severe pain. Patient taking differently: Take 1 tablet by mouth 2 (two) times daily as needed for moderate pain or severe pain.  06/07/15  Yes Verlee Monte, MD  insulin aspart (NOVOLOG) 100 UNIT/ML injection Inject 0-9 Units into the skin 3 (three) times daily with meals. Sliding scale CBG 70 - 120: 0 units CBG 121 - 150: 1 unit,  CBG 151 - 200: 2 units,  CBG 201 - 250: 3 units,  CBG 251 - 300: 5 units,  CBG 301 - 350: 7 units,  CBG 351 - 400: 9 units   CBG > 400: 9 units and notify  your MD Patient taking differently: Inject 1-9 Units into the skin See admin instructions. 1-9 units into the skin three times a day before meals per sliding scale: BGL 101-150 = 1 unit; 151-200 = 2 units; 201-250 = 3 units; 251-300 = 5 units; 301-350 = 7 units; >350 = 9 units; CALL MD FOR BGL >400 OR <60 10/13/14  Yes Rai, Ripudeep K, MD  insulin detemir (LEVEMIR) 100 UNIT/ML injection Inject 30-42 Units into the skin See admin instructions. 42 units into the skin in the morning and 30 units at bedtime   Yes [provider]  Iron-FA-B Cmp-C-Biot-Probiotic (FUSION PLUS) CAPS Take 1 capsule by mouth daily.   Yes [provider]  levothyroxine (SYNTHROID, LEVOTHROID) 50 MCG tablet Take 50 mcg by mouth daily before breakfast.    Yes [provider]  loratadine (CLARITIN) 10 MG tablet Take 10 mg by mouth daily.    Yes [provider]  Menthol, Topical Analgesic, (BIOFREEZE) 4 % GEL Apply 1 application topically See admin instructions. 1 application to lower back two times a day   Yes [provider]  Multiple Vitamins-Minerals (50+ ADULT EYE HEALTH) CAPS Take 1 capsule by mouth at bedtime.   Yes [provider]  Multiple Vitamins-Minerals (EYE VITAMINS & MINERALS) TABS Take 2 tablets by mouth daily.    Yes [provider]  Neomycin-Bacitracin-Polymyxin (TRIPLE ANTIBIOTIC EX) Apply 1 application topically See admin instructions. 1 application to left shin daily   Yes [provider]  ondansetron (ZOFRAN) 4 MG tablet Take 4 mg by mouth every 8 (eight) hours as needed for nausea or vomiting.   Yes [provider]  polyethylene glycol (MIRALAX / GLYCOLAX) packet Take 17 g by mouth daily as needed for mild constipation, moderate constipation or severe constipation. Mix in 8 oz liquid and drink Patient taking differently: Take 17 g by mouth daily as needed (for constipation). Mix in 8 oz liquid and drink 10/13/14  Yes Rai, Ripudeep K,  MD  potassium chloride (K-DUR,KLOR-CON) 10 MEQ tablet Take 10 mEq by mouth daily.   Yes [provider]  rosuvastatin (CRESTOR) 20 MG tablet Take 20 mg by mouth every evening.    Yes  [provider]  traMADol (ULTRAM) 50 MG tablet Take 1 tablet (50 mg total) by mouth every 6 (six) hours as needed. Patient taking differently: Take 50 mg by mouth every 6 (six) hours as needed (for pain).  09/19/16  Yes Kathie Rhodes, MD  Trospium Chloride 60 MG CP24 Take 60 mg by mouth daily.   Yes [provider]  UNABLE TO FIND MedPass: Drink 120 ml's by mouth two times a day   Yes [provider]  Vitamin D, Ergocalciferol, (DRISDOL) 50000 units CAPS capsule Take 50,000 Units by mouth every Friday.   Yes [provider]  pantoprazole (PROTONIX) 40 MG tablet Take 1 tablet (40 mg total) by mouth daily. Patient not taking: Reported on 06/02/2017 08/13/14   Reyne Dumas, MD    Family History Family History  Problem Relation Age of Onset  . Diabetes Mother     Social History Social History   Tobacco Use  . Smoking status: Former Smoker    Packs/day: 0.12    Years: 60.00    Pack years: 7.20    Types: Cigarettes    Last attempt to quit: 08/29/2016    Years since quitting: 0.7  . Smokeless tobacco: Never Used  . Tobacco comment: states she smokes 4 cigs per day  Substance Use Topics  . Alcohol use: No    Alcohol/week: 0.0 oz  . Drug use: No     Allergies   Ace inhibitors; Codeine; Lisinopril; Norco [hydrocodone-acetaminophen]; Penicillins; Trimethoprim; Septra [sulfamethoxazole-trimethoprim]; and Sulfur   Review of Systems Review of Systems  Constitutional: Positive for fatigue.  Respiratory: Positive for shortness of breath.   Cardiovascular: Positive for chest pain and leg swelling.  All other systems reviewed and are negative.    Physical Exam Updated Vital Signs BP (!) 128/111   Pulse 66   Resp 19   Ht 5\' 3"  (1.6 m)   Wt 83.9 kg (185 lb)    SpO2 100%   BMI 32.77 kg/m   Physical Exam  Constitutional: She is oriented to person, place, and time. She appears well-developed and well-nourished. No distress.  HENT:  Head: Normocephalic and atraumatic.  Eyes: Conjunctivae are normal.  Neck: Neck supple.  Cardiovascular: Normal rate, regular rhythm and normal heart sounds. Exam reveals no friction rub.  No murmur heard. Pulmonary/Chest: Effort normal and breath sounds normal. No respiratory distress. She has no wheezes. She has no rales. She exhibits tenderness.  Abdominal: She exhibits no distension.  Musculoskeletal: She exhibits no edema.  Neurological: She is alert and oriented to person, place, and time. She exhibits normal muscle tone.  Skin: Skin is warm. Capillary refill takes less than 2 seconds.  Psychiatric: She has a normal mood and affect.  Nursing note and vitals reviewed.    ED Treatments / Results  Labs (all labs ordered are listed, but only abnormal results are displayed) Labs Reviewed  CBC WITH DIFFERENTIAL/PLATELET - Abnormal; Notable for the following components:      Result Value   WBC 13.1 (*)    RBC 3.59 (*)    Hemoglobin 10.2 (*)    HCT 32.2 (*)    Neutro Abs 9.3 (*)    Monocytes Absolute 1.6 (*)    All other components within normal limits  COMPREHENSIVE METABOLIC PANEL - Abnormal; Notable for the following components:   CO2 18 (*)    Glucose, Bld 340 (*)    BUN 45 (*)    Creatinine, Ser 1.40 (*)    Total  Protein 6.3 (*)    Albumin 2.5 (*)    GFR calc non Af Amer 34 (*)    GFR calc Af Amer 40 (*)    All other components within normal limits  BRAIN NATRIURETIC PEPTIDE - Abnormal; Notable for the following components:   B Natriuretic Peptide 301.1 (*)    All other components within normal limits  CBG MONITORING, ED - Abnormal; Notable for the following components:   Glucose-Capillary 338 (*)    All other components within normal limits  PROTIME-INR  APTT  TROPONIN I  LIPID PANEL    TROPONIN I  TROPONIN I  TROPONIN I  BASIC METABOLIC PANEL  HEPARIN LEVEL (UNFRACTIONATED)  I-STAT TROPONIN, ED    EKG  EKG Interpretation  Date/Time:  Friday June 02 2017 23:34:20 EST Ventricular Rate:  70 PR Interval:    QRS Duration: 127 QT Interval:  414 QTC Calculation: 447 R Axis:   -39 Text Interpretation:  Sinus rhythm Right bundle branch block Left ventricular hypertrophy Probable inferior infarct, acute Lateral leads are also involved Baseline wander in lead(s) I III aVL No significant change since last tracing Confirmed by Duffy Bruce [phone]) on 06/03/2017 12:20:55 AM       Radiology Dg Chest Portable 1 View  Result Date: 06/02/2017 CLINICAL DATA:  Chest pain and chest pressure EXAM: PORTABLE CHEST 1 VIEW COMPARISON:  05/31/2015 FINDINGS: Normal heart size. Aortic atherosclerosis noted. There is a small right pleural effusion and mild interstitial edema. IMPRESSION: 1. Suspect mild CHF. 2.  Aortic Atherosclerosis (ICD10-I70.0). Electronically Signed   By: Kerby Moors M.D.   On: 06/02/2017 22:48    Procedures .Critical Care Performed by: Duffy Bruce, MD Authorized by: Duffy Bruce, MD   Critical care provider statement:    Critical care time (minutes):  35   Critical care time was exclusive of:  Separately billable procedures and treating other patients and teaching time   Critical care was necessary to treat or prevent imminent or life-threatening deterioration of the following conditions:  Cardiac failure and circulatory failure   Critical care was time spent personally by me on the following activities:  Development of treatment plan with patient or surrogate, discussions with consultants, evaluation of patient's response to treatment, examination of patient, obtaining history from patient or surrogate, ordering and performing treatments and interventions, ordering and review of laboratory studies, ordering and review of radiographic studies, pulse  oximetry, re-evaluation of patient's condition and review of old charts   I assumed direction of critical care for this patient from another provider in my specialty: no     (including critical care time)  Medications Ordered in ED Medications  acetic acid 0.25 % irrigation (not administered)  alum & mag hydroxide-simeth (MAALOX/MYLANTA) 200-200-20 MG/5ML suspension 30 mL (not administered)  aspirin chewable tablet 81 mg (not administered)  B-complex with vitamin C tablet 1 tablet (not administered)  bisacodyl (DULCOLAX) suppository 10 mg (not administered)  cloNIDine (CATAPRES) tablet 0.1 mg (not administered)  docusate sodium (COLACE) capsule 100 mg (not administered)  escitalopram (LEXAPRO) tablet 5 mg (not administered)  furosemide (LASIX) tablet 20 mg (not administered)  gabapentin (NEURONTIN) capsule 300 mg (not administered)  HYDROcodone-acetaminophen (NORCO/VICODIN) 5-325 MG per tablet 1 tablet (not administered)  levothyroxine (SYNTHROID, LEVOTHROID) tablet 50 mcg (not administered)  loratadine (CLARITIN) tablet 10 mg (not administered)  multivitamin (PROSIGHT) tablet 1 tablet (not administered)  polyethylene glycol (MIRALAX / GLYCOLAX) packet 17 g (not administered)  rosuvastatin (CRESTOR) tablet 20 mg (not administered)  darifenacin (ENABLEX) 24 hr tablet 7.5 mg (not administered)  nitroGLYCERIN (NITROSTAT) SL tablet 0.4 mg (not administered)  acetaminophen (TYLENOL) tablet 650 mg (not administered)  ondansetron (ZOFRAN) injection 4 mg (not administered)  sodium chloride flush (NS) 0.9 % injection 3 mL (not administered)  sodium chloride flush (NS) 0.9 % injection 3 mL (not administered)  0.9 %  sodium chloride infusion (not administered)  insulin aspart (novoLOG) injection 0-15 Units (11 Units Subcutaneous Given 06/03/17 0204)  insulin detemir (LEVEMIR) injection 20 Units (not administered)  carvedilol (COREG) tablet 6.25 mg (not administered)  heparin ADULT infusion 100  units/mL (25000 units/234mL sodium chloride 0.45%) (850 Units/hr Intravenous New Bag/Given 06/03/17 0205)  heparin injection 4,000 Units (4,000 Units Intravenous Given 06/02/17 2225)     Initial Impression / Assessment and Plan / ED Course  I have reviewed the triage vital signs and the nursing notes.  Pertinent labs & imaging results that were available during my care of the patient were reviewed by me and considered in my medical decision making (see chart for details).     83 year old female here with somewhat atypical chest pain.  EKG does show ST elevations in lead II that appear new, as well as development of a right bundle branch block.  She has some subtle ST changes in the lateral precordial leads.  Patient was initially activated as a code STEMI.  Cardiology at bedside.  They have canceled the code STEMI, but do recommend admission for troponins and echocardiogram.  Dr. Massie Bougie has seen pt, will consult/write note and follow along.  He recommends holding on heparin at this time. She does have some mild CHF on her exam, which could be secondary to possible underlying ACS, but could also be primary etiology.  Will add on a BNP and plan for admission.  Final Clinical Impressions(s) / ED Diagnoses   Final diagnoses:  Other chest pain    ED Discharge Orders    None       Duffy Bruce, MD 06/03/17 Glynis Smiles    Duffy Bruce, MD 06/19/17 1005

## 2017-06-02 NOTE — ED Triage Notes (Signed)
From home with chest pain, and pressure since lunchtime.  324 ASA, 1 nitro on board.  Refused further nitro or morphine.  80% on room air.  A&Ox4

## 2017-06-02 NOTE — ED Notes (Signed)
Cardiology at bedside.

## 2017-06-03 ENCOUNTER — Encounter (HOSPITAL_COMMUNITY): Payer: Self-pay | Admitting: Family Medicine

## 2017-06-03 DIAGNOSIS — I25119 Atherosclerotic heart disease of native coronary artery with unspecified angina pectoris: Secondary | ICD-10-CM

## 2017-06-03 DIAGNOSIS — I130 Hypertensive heart and chronic kidney disease with heart failure and stage 1 through stage 4 chronic kidney disease, or unspecified chronic kidney disease: Secondary | ICD-10-CM | POA: Diagnosis not present

## 2017-06-03 DIAGNOSIS — E1122 Type 2 diabetes mellitus with diabetic chronic kidney disease: Secondary | ICD-10-CM | POA: Diagnosis not present

## 2017-06-03 DIAGNOSIS — E119 Type 2 diabetes mellitus without complications: Secondary | ICD-10-CM

## 2017-06-03 DIAGNOSIS — E7800 Pure hypercholesterolemia, unspecified: Secondary | ICD-10-CM | POA: Diagnosis not present

## 2017-06-03 DIAGNOSIS — L8990 Pressure ulcer of unspecified site, unspecified stage: Secondary | ICD-10-CM | POA: Diagnosis not present

## 2017-06-03 DIAGNOSIS — E1142 Type 2 diabetes mellitus with diabetic polyneuropathy: Secondary | ICD-10-CM | POA: Diagnosis not present

## 2017-06-03 DIAGNOSIS — R0789 Other chest pain: Secondary | ICD-10-CM | POA: Diagnosis present

## 2017-06-03 DIAGNOSIS — J449 Chronic obstructive pulmonary disease, unspecified: Secondary | ICD-10-CM | POA: Diagnosis not present

## 2017-06-03 DIAGNOSIS — I4510 Unspecified right bundle-branch block: Secondary | ICD-10-CM | POA: Diagnosis not present

## 2017-06-03 DIAGNOSIS — J309 Allergic rhinitis, unspecified: Secondary | ICD-10-CM | POA: Diagnosis not present

## 2017-06-03 DIAGNOSIS — D638 Anemia in other chronic diseases classified elsewhere: Secondary | ICD-10-CM | POA: Diagnosis not present

## 2017-06-03 DIAGNOSIS — K219 Gastro-esophageal reflux disease without esophagitis: Secondary | ICD-10-CM | POA: Diagnosis not present

## 2017-06-03 DIAGNOSIS — N390 Urinary tract infection, site not specified: Secondary | ICD-10-CM | POA: Diagnosis not present

## 2017-06-03 DIAGNOSIS — G8929 Other chronic pain: Secondary | ICD-10-CM | POA: Diagnosis not present

## 2017-06-03 DIAGNOSIS — I5032 Chronic diastolic (congestive) heart failure: Secondary | ICD-10-CM

## 2017-06-03 DIAGNOSIS — N183 Chronic kidney disease, stage 3 (moderate): Secondary | ICD-10-CM

## 2017-06-03 DIAGNOSIS — R079 Chest pain, unspecified: Secondary | ICD-10-CM | POA: Diagnosis not present

## 2017-06-03 DIAGNOSIS — E1151 Type 2 diabetes mellitus with diabetic peripheral angiopathy without gangrene: Secondary | ICD-10-CM | POA: Diagnosis not present

## 2017-06-03 DIAGNOSIS — D649 Anemia, unspecified: Secondary | ICD-10-CM | POA: Diagnosis not present

## 2017-06-03 DIAGNOSIS — E11649 Type 2 diabetes mellitus with hypoglycemia without coma: Secondary | ICD-10-CM | POA: Diagnosis not present

## 2017-06-03 DIAGNOSIS — E039 Hypothyroidism, unspecified: Secondary | ICD-10-CM | POA: Diagnosis not present

## 2017-06-03 DIAGNOSIS — H3530 Unspecified macular degeneration: Secondary | ICD-10-CM | POA: Diagnosis not present

## 2017-06-03 DIAGNOSIS — I2510 Atherosclerotic heart disease of native coronary artery without angina pectoris: Secondary | ICD-10-CM | POA: Diagnosis not present

## 2017-06-03 DIAGNOSIS — F329 Major depressive disorder, single episode, unspecified: Secondary | ICD-10-CM | POA: Diagnosis not present

## 2017-06-03 DIAGNOSIS — Z66 Do not resuscitate: Secondary | ICD-10-CM | POA: Diagnosis not present

## 2017-06-03 DIAGNOSIS — F17210 Nicotine dependence, cigarettes, uncomplicated: Secondary | ICD-10-CM | POA: Diagnosis not present

## 2017-06-03 DIAGNOSIS — F419 Anxiety disorder, unspecified: Secondary | ICD-10-CM | POA: Diagnosis not present

## 2017-06-03 LAB — CBC WITH DIFFERENTIAL/PLATELET
Basophils Absolute: 0 10*3/uL (ref 0.0–0.1)
Basophils Relative: 0 %
Eosinophils Absolute: 0.1 10*3/uL (ref 0.0–0.7)
Eosinophils Relative: 1 %
HCT: 33 % — ABNORMAL LOW (ref 36.0–46.0)
Hemoglobin: 10.2 g/dL — ABNORMAL LOW (ref 12.0–15.0)
Lymphocytes Relative: 22 %
Lymphs Abs: 2.4 10*3/uL (ref 0.7–4.0)
MCH: 27.7 pg (ref 26.0–34.0)
MCHC: 30.9 g/dL (ref 30.0–36.0)
MCV: 89.7 fL (ref 78.0–100.0)
Monocytes Absolute: 1.5 10*3/uL — ABNORMAL HIGH (ref 0.1–1.0)
Monocytes Relative: 14 %
Neutro Abs: 7.1 10*3/uL (ref 1.7–7.7)
Neutrophils Relative %: 63 %
Platelets: 272 10*3/uL (ref 150–400)
RBC: 3.68 MIL/uL — ABNORMAL LOW (ref 3.87–5.11)
RDW: 13.4 % (ref 11.5–15.5)
WBC: 11.1 10*3/uL — ABNORMAL HIGH (ref 4.0–10.5)

## 2017-06-03 LAB — BASIC METABOLIC PANEL
Anion gap: 11 (ref 5–15)
BUN: 43 mg/dL — ABNORMAL HIGH (ref 6–20)
CHLORIDE: 105 mmol/L (ref 101–111)
CO2: 20 mmol/L — AB (ref 22–32)
Calcium: 9.2 mg/dL (ref 8.9–10.3)
Creatinine, Ser: 1.36 mg/dL — ABNORMAL HIGH (ref 0.44–1.00)
GFR calc Af Amer: 41 mL/min — ABNORMAL LOW (ref >60)
GFR calc non Af Amer: 36 mL/min — ABNORMAL LOW (ref >60)
GLUCOSE: 164 mg/dL — AB (ref 65–99)
POTASSIUM: 4.5 mmol/L (ref 3.5–5.1)
Sodium: 136 mmol/L (ref 135–145)

## 2017-06-03 LAB — TROPONIN I
Troponin I: <0.03 ng/mL (ref <0.03)
Troponin I: <0.03 ng/mL (ref <0.03)
Troponin I: <0.03 ng/mL (ref <0.03)

## 2017-06-03 LAB — HEPARIN LEVEL (UNFRACTIONATED): Heparin Unfractionated: <0.1 IU/mL — ABNORMAL LOW (ref 0.30–0.70)

## 2017-06-03 LAB — CBG MONITORING, ED
GLUCOSE-CAPILLARY: 169 mg/dL — AB (ref 65–99)
Glucose-Capillary: 338 mg/dL — ABNORMAL HIGH (ref 65–99)

## 2017-06-03 LAB — GLUCOSE, CAPILLARY
Glucose-Capillary: 177 mg/dL — ABNORMAL HIGH (ref 65–99)
Glucose-Capillary: 230 mg/dL — ABNORMAL HIGH (ref 65–99)

## 2017-06-03 LAB — BRAIN NATRIURETIC PEPTIDE: B NATRIURETIC PEPTIDE 5: 301.1 pg/mL — AB (ref 0.0–100.0)

## 2017-06-03 MED ORDER — HEPARIN SODIUM (PORCINE) 5000 UNIT/ML IJ SOLN: 2500.0000 [IU] | Freq: Once | INTRAMUSCULAR | Status: DC

## 2017-06-03 MED ORDER — SODIUM CHLORIDE 0.9% FLUSH: 3.0000 mL | INTRAVENOUS | Status: DC | PRN

## 2017-06-03 MED ORDER — ALUM & MAG HYDROXIDE-SIMETH 200-200-20 MG/5ML PO SUSP: 30.0000 mL | Freq: Four times a day (QID) | ORAL | Status: DC | PRN

## 2017-06-03 MED ORDER — PROSIGHT PO TABS
1.0000 | ORAL_TABLET | Freq: Every day | ORAL | Status: DC
  Administered 2017-06-03 – 2017-06-05 (×3): 1 via ORAL
  Filled 2017-06-03 (×4): qty 1

## 2017-06-03 MED ORDER — ONDANSETRON HCL 4 MG/2ML IJ SOLN: 4.0000 mg | Freq: Four times a day (QID) | INTRAMUSCULAR | Status: DC | PRN

## 2017-06-03 MED ORDER — CARVEDILOL 12.5 MG PO TABS
12.5000 mg | ORAL_TABLET | Freq: Two times a day (BID) | ORAL | Status: DC
  Administered 2017-06-03 – 2017-06-06 (×6): 12.5 mg via ORAL
  Filled 2017-06-03 (×6): qty 1

## 2017-06-03 MED ORDER — FUROSEMIDE 20 MG PO TABS: 20.0000 mg | ORAL_TABLET | Freq: Every day | ORAL | Status: DC

## 2017-06-03 MED ORDER — FUROSEMIDE 10 MG/ML IJ SOLN
40.0000 mg | Freq: Two times a day (BID) | INTRAMUSCULAR | Status: DC
  Administered 2017-06-03 – 2017-06-05 (×4): 40 mg via INTRAVENOUS
  Filled 2017-06-03 (×4): qty 4

## 2017-06-03 MED ORDER — ACETAMINOPHEN 325 MG PO TABS
650.0000 mg | ORAL_TABLET | ORAL | Status: DC | PRN
Start: 2017-06-03 — End: 2017-06-06

## 2017-06-03 MED ORDER — BISACODYL 10 MG RE SUPP
10.0000 mg | Freq: Every day | RECTAL | Status: DC | PRN
  Filled 2017-06-03: qty 1

## 2017-06-03 MED ORDER — SODIUM CHLORIDE 0.9 % IV SOLN: 250.0000 mL | INTRAVENOUS | Status: DC | PRN

## 2017-06-03 MED ORDER — SODIUM CHLORIDE 0.9% FLUSH
3.0000 mL | Freq: Two times a day (BID) | INTRAVENOUS | Status: DC
  Administered 2017-06-03 – 2017-06-05 (×5): 3 mL via INTRAVENOUS

## 2017-06-03 MED ORDER — ESCITALOPRAM OXALATE 10 MG PO TABS
5.0000 mg | ORAL_TABLET | Freq: Every day | ORAL | Status: DC
  Administered 2017-06-03 – 2017-06-06 (×4): 5 mg via ORAL
  Filled 2017-06-03 (×4): qty 1

## 2017-06-03 MED ORDER — FUROSEMIDE 10 MG/ML IJ SOLN: 40.0000 mg | Freq: Two times a day (BID) | INTRAMUSCULAR | Status: DC

## 2017-06-03 MED ORDER — INSULIN ASPART 100 UNIT/ML ~~LOC~~ SOLN
0.0000 [IU] | SUBCUTANEOUS | Status: DC
  Administered 2017-06-03: 11 [IU] via SUBCUTANEOUS
  Administered 2017-06-03: 5 [IU] via SUBCUTANEOUS
  Administered 2017-06-03: 3 [IU] via SUBCUTANEOUS
  Administered 2017-06-04 (×2): 8 [IU] via SUBCUTANEOUS
  Administered 2017-06-04 – 2017-06-05 (×2): 3 [IU] via SUBCUTANEOUS
  Administered 2017-06-05: 8 [IU] via SUBCUTANEOUS
  Administered 2017-06-06: 3 [IU] via SUBCUTANEOUS
  Administered 2017-06-06: 5 [IU] via SUBCUTANEOUS
  Administered 2017-06-06: 11 [IU] via SUBCUTANEOUS
  Filled 2017-06-03: qty 1

## 2017-06-03 MED ORDER — POLYETHYLENE GLYCOL 3350 17 G PO PACK: 17.0000 g | PACK | Freq: Every day | ORAL | Status: DC | PRN

## 2017-06-03 MED ORDER — DARIFENACIN HYDROBROMIDE ER 7.5 MG PO TB24
7.5000 mg | ORAL_TABLET | Freq: Every day | ORAL | Status: DC
  Administered 2017-06-04 – 2017-06-06 (×3): 7.5 mg via ORAL
  Filled 2017-06-03 (×4): qty 1

## 2017-06-03 MED ORDER — LORATADINE 10 MG PO TABS
10.0000 mg | ORAL_TABLET | Freq: Every day | ORAL | Status: DC
  Administered 2017-06-04 – 2017-06-06 (×3): 10 mg via ORAL
  Filled 2017-06-03 (×3): qty 1

## 2017-06-03 MED ORDER — CLONIDINE HCL 0.1 MG PO TABS
0.1000 mg | ORAL_TABLET | Freq: Two times a day (BID) | ORAL | Status: DC
  Administered 2017-06-03 – 2017-06-06 (×6): 0.1 mg via ORAL
  Filled 2017-06-03 (×6): qty 1

## 2017-06-03 MED ORDER — HYDROCODONE-ACETAMINOPHEN 5-325 MG PO TABS
1.0000 | ORAL_TABLET | Freq: Four times a day (QID) | ORAL | Status: DC | PRN
  Administered 2017-06-05: 1 via ORAL
  Filled 2017-06-03: qty 1

## 2017-06-03 MED ORDER — DOCUSATE SODIUM 100 MG PO CAPS
100.0000 mg | ORAL_CAPSULE | Freq: Two times a day (BID) | ORAL | Status: DC
  Administered 2017-06-03 – 2017-06-06 (×6): 100 mg via ORAL
  Filled 2017-06-03 (×6): qty 1

## 2017-06-03 MED ORDER — CARVEDILOL 12.5 MG PO TABS: 6.2500 mg | ORAL_TABLET | Freq: Two times a day (BID) | ORAL | Status: DC

## 2017-06-03 MED ORDER — NITROGLYCERIN 0.4 MG SL SUBL: 0.4000 mg | SUBLINGUAL_TABLET | SUBLINGUAL | Status: DC | PRN

## 2017-06-03 MED ORDER — ROSUVASTATIN CALCIUM 20 MG PO TABS
20.0000 mg | ORAL_TABLET | Freq: Every evening | ORAL | Status: DC
  Administered 2017-06-03 – 2017-06-05 (×3): 20 mg via ORAL
  Filled 2017-06-03 (×4): qty 1

## 2017-06-03 MED ORDER — ACETIC ACID 0.25 % IR SOLN
Freq: Every day | Status: DC
Start: 2017-06-03 — End: 2017-06-06
  Administered 2017-06-04 – 2017-06-06 (×3)
  Filled 2017-06-03 (×3): qty 1000

## 2017-06-03 MED ORDER — INSULIN DETEMIR 100 UNIT/ML ~~LOC~~ SOLN
20.0000 [IU] | Freq: Every day | SUBCUTANEOUS | Status: DC
  Administered 2017-06-03 – 2017-06-05 (×3): 20 [IU] via SUBCUTANEOUS
  Filled 2017-06-03 (×6): qty 0.2

## 2017-06-03 MED ORDER — LEVOTHYROXINE SODIUM 50 MCG PO TABS
50.0000 ug | ORAL_TABLET | Freq: Every day | ORAL | Status: DC
  Administered 2017-06-04 – 2017-06-06 (×3): 50 ug via ORAL
  Filled 2017-06-03 (×4): qty 1

## 2017-06-03 MED ORDER — ENOXAPARIN SODIUM 40 MG/0.4ML ~~LOC~~ SOLN
40.0000 mg | SUBCUTANEOUS | Status: DC
  Administered 2017-06-03 – 2017-06-04 (×2): 40 mg via SUBCUTANEOUS
  Filled 2017-06-03 (×2): qty 0.4

## 2017-06-03 MED ORDER — B COMPLEX-C PO TABS
1.0000 | ORAL_TABLET | Freq: Every day | ORAL | Status: DC
Start: 2017-06-03 — End: 2017-06-06
  Administered 2017-06-04 – 2017-06-06 (×3): 1 via ORAL
  Filled 2017-06-03 (×4): qty 1

## 2017-06-03 MED ORDER — HEPARIN (PORCINE) IN NACL 100-0.45 UNIT/ML-% IJ SOLN
1100.0000 [IU]/h | INTRAMUSCULAR | Status: DC
  Administered 2017-06-03: 850 [IU]/h via INTRAVENOUS
  Filled 2017-06-03: qty 250

## 2017-06-03 MED ORDER — GABAPENTIN 300 MG PO CAPS
300.0000 mg | ORAL_CAPSULE | Freq: Three times a day (TID) | ORAL | Status: DC
  Administered 2017-06-03 – 2017-06-06 (×9): 300 mg via ORAL
  Filled 2017-06-03 (×9): qty 1

## 2017-06-03 MED ORDER — ASPIRIN 81 MG PO CHEW
81.0000 mg | CHEWABLE_TABLET | Freq: Every day | ORAL | Status: DC
  Administered 2017-06-03 – 2017-06-06 (×4): 81 mg via ORAL
  Filled 2017-06-03 (×4): qty 1

## 2017-06-03 NOTE — H&P (Signed)
History and Physical    [name] WNI:[id number] DOB: 06/26/1934 DOA: 06/02/2017  PCP: Patient, No Pcp Per   Patient coming from: Home  Chief Complaint: Chest pain   HPI: [name] is a 83 y.o. female with medical history significant for insulin-dependent diabetes mellitus, hypertension, chronic kidney disease stage III, hypothyroidism, chronic diastolic CHF, and coronary artery disease, now presenting to the emergency department for evaluation of chest pain.  Patient reports that she had been in her usual state of health and was having an uneventful day when she developed severe nonradiating pain in the central chest and around noon.  Pain remained fairly constant with no alleviating or exacerbating factors identified.  She reports some associated nausea without vomiting, shortness of breath, and reports some sweats associated with this at time of onset.  She denies experiencing similar symptoms previously.  Denies any recent fevers or chills.  Denies any lower extremity swelling or tenderness.  ED Course: Upon arrival to the ED, patient is found to be saturating well on 3 L/min of supplemental oxygen, and with vitals otherwise normal.  EKG features a sinus rhythm with chronic RBBB, chronic LAFB, and ST abnormalities in the inferolateral leads.  Chest x-ray findings are consistent with mild CHF.  Chemistry panel features a serum creatinine of 1.40, better than priors, and a serum glucose of 340.  CBC is notable for leukocytosis to 13,100 and a chronic stable normocytic anemia with hemoglobin of 10.2.  Initial troponin is undetectable.  Patient was given IV heparin and cardiology was consulted.  Cardiology reviewed the EKG and recommends a medical admission.  Patient remains hemodynamically stable, continues to have some chest pain though reports it has improved, and will be observed in the stepdown unit for ongoing evaluation and management of chest pain with EKG changes and known  CAD.  Review of Systems:  All other systems reviewed and apart from HPI, are negative.  Past Medical History:  Diagnosis Date  . Allergic rhinitis   . Anemia    iron deficiency   . Anxiety   . Autonomic neuropathy   . Carotid arterial disease ([address])    a. 09/2013 Carotid U/S: 40-59% bilat ICA stenosis.  . CHF (congestive heart failure) ([place])   . Cholelithiasis   . Chronic pain   . CKD (chronic kidney disease), stage III ([place])   . Constipation   . COPD (chronic obstructive pulmonary disease) ([place])   . Dehydration   . Depression    major depressive disorder  . Diabetic peripheral neuropathy ([place])   . Enterococcus UTI    a. 09/2013.  Marland Kitchen GERD (gastroesophageal reflux disease)   . H/O echocardiogram    a. 09/2013 Echo: nl LV fxn, no rwma, mildly to mod dil LA, mildly dil RA.  Marland Kitchen Headache(784.0)   . Hypercholesteremia   . Hypertension   . Hypothyroid   . Lack of coordination   . Macular degeneration, bilateral   . Non-obstructive CAD    a. 11/2005 Cath: LM nl, LAD 30 diffuse, LCX small, 20p, 81m/d, 75d into OM, RCA dominant, 40p, 30-40 diffuse, EF 55%.  Marland Kitchen PAD (peripheral artery disease) ([place])    a. 1997 s/p R fem-pop bypass;  b. 03/1999 R CIA stenting.  . Pyelonephritis   . TIA (transient ischemic attack)    a. 09/2013.  . Tobacco abuse   . Type II diabetes mellitus ([place])   . Urinary tract infection     Past Surgical History:  Procedure Laterality Date  .  ABDOMINAL HYSTERECTOMY  1970's  . ANTERIOR CERVICAL DECOMP/DISCECTOMY FUSION  01/02/2006   Archie Endo 01/02/2006 (11/01/2012)  . BYPASS GRAFT Right ~ 1997   RLE by Dr. Gwenlyn Perking 11/29/2005 (11/01/2012)  . CARDIAC CATHETERIZATION  11/29/2005   Archie Endo 11/29/2005 (11/01/2012)  . CATARACT EXTRACTION W/ INTRAOCULAR LENS  IMPLANT, BILATERAL Bilateral ~ 2010  . CYSTOSTOMY N/A 09/19/2016   Procedure: CYSTOSCOPY WITH SUPRAPUBIC TUBE PLACEMENT;  Surgeon: Kathie Rhodes, MD;  Location: [facility];  Service: Urology;  Laterality: N/A;  . CYSTOSTOMY  W/ BLADDER BIOPSY  2005   Archie Endo 09/19/2003 (11/01/2012)  . DILATION AND CURETTAGE OF UTERUS  1970's   "probably 2" (11/01/2012)  . ESOPHAGOGASTRODUODENOSCOPY N/A 10/14/2013   Procedure: ESOPHAGOGASTRODUODENOSCOPY (EGD);  Surgeon: Missy Sabins, MD;  Location: [facility];  Service: Endoscopy;  Laterality: N/A;  . SHOULDER OPEN ROTATOR CUFF REPAIR Bilateral 1990's-2000's   "3X on the left; twice on the right" (11/01/2012)  . TONSILLECTOMY  1940's     reports that she quit smoking about 9 months ago. Her smoking use included cigarettes. She has a 7.20 pack-year smoking history. she has never used smokeless tobacco. She reports that she does not drink alcohol or use drugs.  Allergies  Allergen Reactions  . Ace Inhibitors Other (See Comments)    Listed on MAR  . Codeine Other (See Comments)    Listed on MAR  . Lisinopril Other (See Comments)    Listed on MAR  . Norco [Hydrocodone-Acetaminophen] Other (See Comments)    Listed on MAR  . Penicillins Hives    Has patient had a PCN reaction causing immediate rash, facial/tongue/throat swelling, SOB or lightheadedness with hypotension: Yes Has patient had a PCN reaction causing severe rash involving mucus membranes or skin necrosis: Unk Has patient had a PCN reaction that required hospitalization: Unk Has patient had a PCN reaction occurring within the last 10 years: Unk If all of the above answers are "NO", then may proceed with Cephalosporin use.   . Trimethoprim Other (See Comments)    Listed on MAR  . Septra [Sulfamethoxazole-Trimethoprim] Hives and Rash  . Sulfur Hives and Rash    Family History  Problem Relation Age of Onset  . Diabetes Mother      Prior to Admission medications   Medication Sig Start Date End Date Taking? Authorizing Provider  acetaminophen (TYLENOL) 325 MG tablet Take 650 mg by mouth daily as needed for mild pain.    Yes [provider]  acetaminophen (TYLENOL) 500 MG tablet Take 500 mg by mouth 3  (three) times daily.    Yes [provider]  acetic acid 0.25 % irrigation 30 ml's mixed with 30 ml's sterile water irrigate SP cath daily   Yes [provider]  alum & mag hydroxide-simeth (MAALOX/MYLANTA) 200-200-20 MG/5ML suspension Take 30 mLs by mouth every 6 (six) hours as needed for indigestion or heartburn. Do not exceed 4 doses in 24 hours   Yes [provider]  Amino Acids-Protein Hydrolys (FEEDING SUPPLEMENT, PRO-STAT SUGAR FREE 64,) LIQD Take 15 mLs by mouth 2 (two) times daily.   Yes [provider]  amLODipine (NORVASC) 10 MG tablet Take 10 mg by mouth at bedtime.    Yes [provider]  aspirin 81 MG chewable tablet Chew 81 mg by mouth daily.   Yes [provider]  B Complex-C-Folic Acid (VITA-BEE/C) TABS Take 1 tablet by mouth daily.   Yes [provider]  bisacodyl (DULCOLAX) 10 MG suppository Place 10 mg rectally daily  as needed for moderate constipation.    Yes [provider]  carvedilol (COREG) 6.25 MG tablet Take 1 tablet (6.25 mg total) by mouth 2 (two) times daily with a meal. 10/13/14  Yes Rai, Ripudeep K, MD  cloNIDine (CATAPRES) 0.1 MG tablet Take 0.1 mg by mouth 2 (two) times daily. Hold for SBP<110   Yes [provider]  Cranberry 250 MG TABS Take 1,000 mg by mouth 2 (two) times daily.   Yes [provider]  docusate sodium (COLACE) 100 MG capsule Take 1 capsule (100 mg total) by mouth every 12 (twelve) hours. 08/07/14  Yes Ward, Kristen N, DO  escitalopram (LEXAPRO) 5 MG tablet Take 5 mg by mouth daily.   Yes [provider]  furosemide (LASIX) 20 MG tablet Take 20 mg by mouth daily.   Yes [provider]  gabapentin (NEURONTIN) 300 MG capsule Take 300 mg by mouth 3 (three) times daily.    Yes [provider]  HYDROcodone-acetaminophen (NORCO/VICODIN) 5-325 MG tablet Take 1 tablet by mouth every 6 (six) hours as needed for moderate pain or severe  pain. Patient taking differently: Take 1 tablet by mouth 2 (two) times daily as needed for moderate pain or severe pain.  06/07/15  Yes Verlee Monte, MD  insulin aspart (NOVOLOG) 100 UNIT/ML injection Inject 0-9 Units into the skin 3 (three) times daily with meals. Sliding scale CBG 70 - 120: 0 units CBG 121 - 150: 1 unit,  CBG 151 - 200: 2 units,  CBG 201 - 250: 3 units,  CBG 251 - 300: 5 units,  CBG 301 - 350: 7 units,  CBG 351 - 400: 9 units   CBG > 400: 9 units and notify your MD Patient taking differently: Inject 1-9 Units into the skin See admin instructions. 1-9 units into the skin three times a day before meals per sliding scale: BGL 101-150 = 1 unit; 151-200 = 2 units; 201-250 = 3 units; 251-300 = 5 units; 301-350 = 7 units; >350 = 9 units; CALL MD FOR BGL >400 OR <60 10/13/14  Yes Rai, Ripudeep K, MD  insulin detemir (LEVEMIR) 100 UNIT/ML injection Inject 30-42 Units into the skin See admin instructions. 42 units into the skin in the morning and 30 units at bedtime   Yes [provider]  Iron-FA-B Cmp-C-Biot-Probiotic (FUSION PLUS) CAPS Take 1 capsule by mouth daily.   Yes [provider]  levothyroxine (SYNTHROID, LEVOTHROID) 50 MCG tablet Take 50 mcg by mouth daily before breakfast.    Yes [provider]  loratadine (CLARITIN) 10 MG tablet Take 10 mg by mouth daily.    Yes [provider]  Menthol, Topical Analgesic, (BIOFREEZE) 4 % GEL Apply 1 application topically See admin instructions. 1 application to lower back two times a day   Yes [provider]  Multiple Vitamins-Minerals (50+ ADULT EYE HEALTH) CAPS Take 1 capsule by mouth at bedtime.   Yes [provider]  Multiple Vitamins-Minerals (EYE VITAMINS & MINERALS) TABS Take 2 tablets by mouth daily.    Yes [provider]  Neomycin-Bacitracin-Polymyxin (TRIPLE ANTIBIOTIC EX) Apply 1 application topically See admin instructions. 1 application to left shin daily   Yes [provider]  ondansetron (ZOFRAN) 4 MG tablet Take 4 mg by mouth every 8 (eight) hours as needed for nausea or vomiting.   Yes [provider]  polyethylene glycol (MIRALAX / GLYCOLAX) packet Take 17 g by mouth daily as needed for mild constipation, moderate  constipation or severe constipation. Mix in 8 oz liquid and drink Patient taking differently: Take 17 g by mouth daily as needed (for constipation). Mix in 8 oz liquid and drink 10/13/14  Yes Rai, Ripudeep K, MD  potassium chloride (K-DUR,KLOR-CON) 10 MEQ tablet Take 10 mEq by mouth daily.   Yes [provider]  rosuvastatin (CRESTOR) 20 MG tablet Take 20 mg by mouth every evening.    Yes [provider]  traMADol (ULTRAM) 50 MG tablet Take 1 tablet (50 mg total) by mouth every 6 (six) hours as needed. Patient taking differently: Take 50 mg by mouth every 6 (six) hours as needed (for pain).  09/19/16  Yes Kathie Rhodes, MD  Trospium Chloride 60 MG CP24 Take 60 mg by mouth daily.   Yes [provider]  UNABLE TO FIND MedPass: Drink 120 ml's by mouth two times a day   Yes [provider]  Vitamin D, Ergocalciferol, (DRISDOL) 50000 units CAPS capsule Take 50,000 Units by mouth every Friday.   Yes [provider]  pantoprazole (PROTONIX) 40 MG tablet Take 1 tablet (40 mg total) by mouth daily. Patient not taking: Reported on 06/02/2017 08/13/14   Reyne Dumas, MD    Physical Exam: Vitals:   06/02/17 2235 06/02/17 2330 06/03/17 0015 06/03/17 0045  BP:  (!) 147/54 (!) 132/51 (!) 132/55  Pulse:  72 66 65  Resp:  20 (!) 21 19  SpO2:  100% 100% 99%  Weight: 83.9 kg (185 lb)     Height: 5\' 3"  (1.6 m)         Constitutional: NAD, calm  Eyes: PERTLA, lids and conjunctivae normal ENMT: Mucous membranes are moist. Posterior pharynx clear of any exudate or lesions.   Neck: normal, supple, no masses, no thyromegaly Respiratory: clear to auscultation bilaterally, no wheezing, no crackles. Normal  respiratory effort.   Cardiovascular: S1 & S2 heard, regular rate and rhythm. No extremity edema. No significant JVD. Abdomen: No distension, no tenderness, no masses palpated. Bowel sounds normal.  Musculoskeletal: no clubbing / cyanosis. No joint deformity upper and lower extremities.    Skin: no significant rashes, lesions, ulcers. Warm, dry, well-perfused. Neurologic: CN 2-12 grossly intact. Sensation intact. Strength 5/5 in all 4 limbs.  Psychiatric: Alert and oriented x 3. Calm, cooperative.     Labs on Admission: I have personally reviewed following labs and imaging studies  CBC: Recent Labs  Lab 06/02/17 2224  WBC 13.1*  NEUTROABS 9.3*  HGB 10.2*  HCT 32.2*  MCV 89.7  PLT 263   Basic Metabolic Panel: Recent Labs  Lab 06/02/17 2224  NA 135  K 4.6  CL 103  CO2 18*  GLUCOSE 340*  BUN 45*  CREATININE 1.40*  CALCIUM 9.2   GFR: Estimated Creatinine Clearance: 32.9 mL/min (A) (by C-G formula based on SCr of 1.4 mg/dL (H)). Liver Function Tests: Recent Labs  Lab 06/02/17 2224  AST 25  ALT 17  ALKPHOS 82  BILITOT 0.6  PROT 6.3*  ALBUMIN 2.5*   No results for input(s): LIPASE, AMYLASE in the last 168 hours. No results for input(s): AMMONIA in the last 168 hours. Coagulation Profile: Recent Labs  Lab 06/02/17 2224  INR 1.05   Cardiac Enzymes: Recent Labs  Lab 06/02/17 2224  TROPONINI <0.03   BNP (last 3 results) No results for input(s): PROBNP in the last 8760 hours. HbA1C: No results for input(s): HGBA1C in the last 72 hours. CBG: No results for input(s): GLUCAP in the last  168 hours. Lipid Profile: Recent Labs    06/02/17 2224  CHOL 116  HDL 42  LDLCALC 50  TRIG 118  CHOLHDL 2.8   Thyroid Function Tests: No results for input(s): TSH, T4TOTAL, FREET4, T3FREE, THYROIDAB in the last 72 hours. Anemia Panel: No results for input(s): VITAMINB12, FOLATE, FERRITIN, TIBC, IRON, RETICCTPCT in the last 72 hours. Urine analysis:    Component  Value Date/Time   COLORURINE YELLOW 06/03/2015 1530   APPEARANCEUR CLOUDY (A) 06/03/2015 1530   LABSPEC 1.011 06/03/2015 1530   PHURINE 5.5 06/03/2015 1530   GLUCOSEU NEGATIVE 06/03/2015 1530   HGBUR SMALL (A) 06/03/2015 1530   BILIRUBINUR NEGATIVE 06/03/2015 1530   KETONESUR NEGATIVE 06/03/2015 1530   PROTEINUR 100 (A) 06/03/2015 1530   UROBILINOGEN 0.2 10/09/2014 2040   NITRITE NEGATIVE 06/03/2015 1530   LEUKOCYTESUR MODERATE (A) 06/03/2015 1530   Sepsis Labs: @LABRCNTIP (procalcitonin:4,lacticidven:4) )No results found for this or any previous visit (from the past 240 hour(s)).   Radiological Exams on Admission: Dg Chest Portable 1 View  Result Date: 06/02/2017 CLINICAL DATA:  Chest pain and chest pressure EXAM: PORTABLE CHEST 1 VIEW COMPARISON:  05/31/2015 FINDINGS: Normal heart size. Aortic atherosclerosis noted. There is a small right pleural effusion and mild interstitial edema. IMPRESSION: 1. Suspect mild CHF. 2.  Aortic Atherosclerosis (ICD10-I70.0). Electronically Signed   By: Kerby Moors M.D.   On: 06/02/2017 22:48    EKG: Independently reviewed. Sinus rhythm, chronic RBBB and LAFB, new ST-abnormalities in inferolateral leads.   Assessment/Plan  1. Chest pain; EKG changes; CAD  - Pt has known CAD and presents with acute-onset of chest pain  - Found to have EKG changes, undetectable first troponin, and CXR with mild CHF  - Treated with ASA 324 and nitroglycerin prior to arrival  - Cardiology is consulting, will follow-up on recommendations  - Continue cardiac monitoring, obtain serial troponin measurements, trend EKG, continue IV heparin, continue beta-blocker and statin, check echocardiogram   2. Chronic diastolic CHF  - Appears euvolemic, CXR suggests mild CHF  - SLIV, follow daily wts and I/O's, continue Lasix, Coreg, echo ordered as above   3. Insulin-dependent DM  - A1c was 7.5% in May 2018  - Managed at home with Levemir 30 units qAM and 42 units qPM, as  well as Novolog 0-9 units TID  - Follow CBG's, continue Levemir with 20 units BID and start SSI with Novolog   4. Hypertension  - BP at goal  - Continue Coreg and clonidine    5. CKD stage III  - SCr is 1.40 on admission, improved from priors  - Renally-dose medications, avoid nephrotoxins where possible    6. Chronic normocytic anemia  - Hgb is stable at 10.2 and patient denies bleeding  - Follow closely while on IV heparin    DVT prophylaxis: IV heparin infusion Code Status: DNR  Family Communication: Discussed with patient Disposition Plan: Observe on SDU Consults called: Cardiology  Admission status: Observation     Vianne Bulls, MD [facility] Pager [phone]  If 7PM-7AM, please contact night-coverage [email or web address] Password [email or web address]  06/03/2017, 1:17 AM

## 2017-06-03 NOTE — ED Notes (Signed)
Admitting at bedside 

## 2017-06-03 NOTE — Progress Notes (Addendum)
Addnedum: Heparin discontinued by Dr. Marthenia Rolling.   ANTICOAGULATION CONSULT NOTE - Follow Up Consult  Pharmacy Consult for Heparin Indication: chest pain/ACS  Allergies  Allergen Reactions  . Ace Inhibitors Other (See Comments)    Listed on MAR  . Codeine Other (See Comments)    Listed on MAR  . Lisinopril Other (See Comments)    Listed on MAR  . Norco [Hydrocodone-Acetaminophen] Other (See Comments)    Listed on MAR  . Penicillins Hives    Has patient had a PCN reaction causing immediate rash, facial/tongue/throat swelling, SOB or lightheadedness with hypotension: Yes Has patient had a PCN reaction causing severe rash involving mucus membranes or skin necrosis: Unk Has patient had a PCN reaction that required hospitalization: Unk Has patient had a PCN reaction occurring within the last 10 years: Unk If all of the above answers are "NO", then may proceed with Cephalosporin use.   . Trimethoprim Other (See Comments)    Listed on MAR  . Septra [Sulfamethoxazole-Trimethoprim] Hives and Rash  . Sulfur Hives and Rash    Patient Measurements: Height: 5\' 3"  (160 cm) Weight: 185 lb (83.9 kg) IBW/kg (Calculated) : 52.4 Heparin Dosing Weight: 71 kg  Vital Signs: BP: 131/43 (01/19 0830) Pulse Rate: 65 (01/19 0830)  Labs: Recent Labs    06/02/17 2224 06/03/17 0146  HGB 10.2*  --   HCT 32.2*  --   PLT 276  --   APTT 31  --   LABPROT 13.6  --   INR 1.05  --   CREATININE 1.40*  --   TROPONINI <0.03 <0.03    Estimated Creatinine Clearance: 32.9 mL/min (A) (by C-G formula based on SCr of 1.4 mg/dL (H)).   Medications:  Infusions:  . sodium chloride    . heparin 850 Units/hr (06/03/17 0205)    Assessment: 83 year old female admitted for ACS/chest pain and initiated on IV heparin.   Initial heparin level is undetectable at <0.1 after 4000 unit bolus and drip rate of 850 units/hr.  H/H was low-stable. Platelets within normal limits.  No bleeding reported. Troponin is  negative x3.   Goal of Therapy:  Heparin level 0.3-0.7 units/ml Monitor platelets by anticoagulation protocol: Yes   Plan:  Re-bolus 2500 units x1, then increase to 1100 units/hr.  Recheck heparin level in 8 hours.  Daily heparin level and CBC while on therapy.   Sloan Leiter, PharmD, BCPS, BCCCP Clinical Pharmacist Clinical phone 06/03/2017 until 3:30PM [phone] After hours, please call [phone] 06/03/2017,11:09 AM

## 2017-06-03 NOTE — Consult Note (Addendum)
CARDIOLOGY CONSULT NOTE   Referring Physician: Dr. Nickolas Madrid Primary Physician: Dr. Mayra Neer Primary Cardiologist: New  on: Chest pain  HPI:  Patient is an 83 y/o F has not been seen by cardiology  was brought in by EMS today for substernal chest pain. Pain presumably started at 12 this afternoon prior to her eating lunch. Pain remained constant since that time and has not improved. She denies any alleviating or aggravating factors. Pain is reproducible and worse with deep inspirations. She denies cough or productive sputum, but does endorse some shortness of breath. She also denies any PND or orthopnea. She denies any radiation of the pain and also endorses nausea without abdominal pain or vomiting. At the time of EMS arrival, an EKG was obtained and was concerning for inferior ST elevations, but on review by Dr. Gwenlyn Found and myself, it appeared that only lead II had these changes and there were non-specific lateral ST changes, but no diagnostic criteria was met for STEMI. Hence, STEMI was cancelled.  Initial troponin was negative and repeat EKG showed similar findings to the initial EKG where there was elevations in only lead II, but no other leads. She has a persistent RBBB and an LAFB. Given the below risks factors, we recommended hospital admission with trending troponin's and obtaining echocardiogram in the AM.  Pmhx: HTN, HLP, CKD with baseline Cr of 1.4-1.8, HFpEF and Type 2 DM  Review of Systems:     Cardiac Review of Systems: {Y] = yes '[ ]'  = no  Chest Pain [  Y  ]  Resting SOB [ Y  ] Exertional SOB  [  ]  Orthopnea [  ]   Pedal Edema [   ]    Palpitations [  ] Syncope  [  ]   Presyncope [   ]  General Review of Systems: [Y] = yes [  ]=no Constitional: recent weight change [  ]; anorexia [  ]; fatigue [ Y ]; nausea [ Y ]; night sweats [  ]; fever [  ]; or chills [  ];                                                                     Eyes : blurred vision [  ]; diplopia [   ];  vision changes [  ];  Amaurosis fugax[  ]; Resp: cough [  ];  wheezing[  ];  hemoptysis[  ];  PND [  ];  GI:  gallstones[  ], vomiting[  ];  dysphagia[  ]; melena[  ];  hematochezia [  ]; heartburn[  ];   GU: kidney stones [  ]; hematuria[  ];   dysuria [  ];  nocturia[  ]; incontinence [  ];             Skin: rash, swelling[  ];, hair loss[  ];  peripheral edema[  ];  or itching[  ]; Musculosketetal: myalgias[  ];  joint swelling[  ];  joint erythema[  ];  joint pain[  ];  back pain[  ];  Heme/Lymph: bruising[  ];  bleeding[  ];  anemia[  ];  Neuro: TIA[  ];  headaches[  ];  stroke[  ];  vertigo[  ];  seizures[  ];   paresthesias[  ];  difficulty walking[  ];  Psych:depression[  ]; anxiety[  ];  Endocrine: diabetes[  ];  thyroid dysfunction[  ];  Other:  Past Medical History:  Diagnosis Date  . Allergic rhinitis   . Anemia    iron deficiency   . Anxiety   . Autonomic neuropathy   . Carotid arterial disease ([place])    a. 09/2013 Carotid U/S: 40-59% bilat ICA stenosis.  . CHF (congestive heart failure) ([place])   . Cholelithiasis   . Chronic pain   . CKD (chronic kidney disease), stage III ([place])   . Constipation   . COPD (chronic obstructive pulmonary disease) ([place])   . Dehydration   . Depression    major depressive disorder  . Diabetic peripheral neuropathy ([place])   . Enterococcus UTI    a. 09/2013.  Marland Kitchen GERD (gastroesophageal reflux disease)   . H/O echocardiogram    a. 09/2013 Echo: nl LV fxn, no rwma, mildly to mod dil LA, mildly dil RA.  Marland Kitchen Headache(784.0)   . Hypercholesteremia   . Hypertension   . Hypothyroid   . Lack of coordination   . Macular degeneration, bilateral   . Non-obstructive CAD    a. 11/2005 Cath: LM nl, LAD 30 diffuse, LCX small, 20p, 87md, 75d into OM, RCA dominant, 40p, 30-40 diffuse, EF 55%.  .Marland KitchenPAD ([place] artery disease) [place]    a. 1997 s/p R fem-pop bypass;  b. 03/1999 R CIA stenting.  . Pyelonephritis   . TIA (transient ischemic attack)    a.  09/2013.  . Tobacco abuse   . Type II diabetes mellitus ([place]   . Urinary tract infection     (Not in a hospital admission)  Infusions: . sodium chloride 20 mL/hr at 06/02/17 2230   Allergies  Allergen Reactions  . Ace Inhibitors Other (See Comments)    Listed on MAR  . Codeine Other (See Comments)    Listed on MAR  . Lisinopril Other (See Comments)    Listed on MAR  . Norco [Hydrocodone-Acetaminophen] Other (See Comments)    Listed on MAR  . Penicillins Hives    Has patient had a PCN reaction causing immediate rash, facial/tongue/throat swelling, SOB or lightheadedness with hypotension: Yes Has patient had a PCN reaction causing severe rash involving mucus membranes or skin necrosis: Unk Has patient had a PCN reaction that required hospitalization: Unk Has patient had a PCN reaction occurring within the last 10 years: Unk If all of the above answers are "NO", then may proceed with Cephalosporin use.   . Trimethoprim Other (See Comments)    Listed on MAR  . Septra [Sulfamethoxazole-Trimethoprim] Hives and Rash  . Sulfur Hives and Rash   Social History   Socioeconomic History  . Marital status: Widowed    Spouse name: Not on file  . Number of children: 4  . Years of education: 9  . Highest education level: Not on file  Social Needs  . Financial resource strain: Not on file  . Food insecurity - worry: Not on file  . Food insecurity - inability: Not on file  . Transportation needs - medical: Not on file  . Transportation needs - non-medical: Not on file  Occupational History  . Occupation: retired  Tobacco Use  . Smoking status: Former Smoker    Packs/day: 0.12    Years: 60.00    Pack years: 7.20    Types: Cigarettes    Last attempt to  quit: 08/29/2016    Years since quitting: 0.7  . Smokeless tobacco: Never Used  . Tobacco comment: states she smokes 4 cigs per day  Substance and Sexual Activity  . Alcohol use: No    Alcohol/week: 0.0 oz  . Drug use: No  .  Sexual activity: No  Other Topics Concern  . Not on file  Social History Narrative   Lives with her Grandson    Family History  Problem Relation Age of Onset  . Diabetes Mother    PHYSICAL EXAM: Vitals:   06/03/17 0015 06/03/17 0045  BP: (!) 132/51 (!) 132/55  Pulse: 66 65  Resp: (!) 21 19  SpO2: 100% 99%   No intake or output data in the 24 hours ending 06/03/17 0107  General:  Well appearing. No respiratory difficulty HEENT: normal Neck: supple. no JVD.  Cor: PMI nondisplaced. Regular rate & rhythm. No rubs, gallops or murmurs. Reproducible tenderness on chest wall. Lungs: clear Abdomen: soft, nontender, nondistended. No hepatosplenomegaly. No bruits or masses. Good bowel sounds. Extremities: no cyanosis, clubbing, rash, trace edema Neuro: alert & oriented x 2(to self and place, but not time), cranial nerves grossly intact. moves all 4 extremities w/o difficulty. Affect pleasant.  ECG:  Results for orders placed or performed during the hospital encounter of 06/02/17 (from the past 24 hour(s))  CBC with Differential/Platelet     Status: Abnormal   Collection Time: 06/02/17 10:24 PM  Result Value Ref Range   WBC 13.1 (H) 4.0 - 10.5 K/uL   RBC 3.59 (L) 3.87 - 5.11 MIL/uL   Hemoglobin 10.2 (L) 12.0 - 15.0 g/dL   HCT 32.2 (L) 36.0 - 46.0 %   MCV 89.7 78.0 - 100.0 fL   MCH 28.4 26.0 - 34.0 pg   MCHC 31.7 30.0 - 36.0 g/dL   RDW 13.3 11.5 - 15.5 %   Platelets 276 150 - 400 K/uL   Neutrophils Relative % 71 %   Neutro Abs 9.3 (H) 1.7 - 7.7 K/uL   Lymphocytes Relative 16 %   Lymphs Abs 2.0 0.7 - 4.0 K/uL   Monocytes Relative 12 %   Monocytes Absolute 1.6 (H) 0.1 - 1.0 K/uL   Eosinophils Relative 1 %   Eosinophils Absolute 0.1 0.0 - 0.7 K/uL   Basophils Relative 0 %   Basophils Absolute 0.0 0.0 - 0.1 K/uL  Protime-INR     Status: None   Collection Time: 06/02/17 10:24 PM  Result Value Ref Range   Prothrombin Time 13.6 11.4 - 15.2 seconds   INR 1.05   APTT     Status:  None   Collection Time: 06/02/17 10:24 PM  Result Value Ref Range   aPTT 31 24 - 36 seconds  Comprehensive metabolic panel     Status: Abnormal   Collection Time: 06/02/17 10:24 PM  Result Value Ref Range   Sodium 135 135 - 145 mmol/L   Potassium 4.6 3.5 - 5.1 mmol/L   Chloride 103 101 - 111 mmol/L   CO2 18 (L) 22 - 32 mmol/L   Glucose, Bld 340 (H) 65 - 99 mg/dL   BUN 45 (H) 6 - 20 mg/dL   Creatinine, Ser 1.40 (H) 0.44 - 1.00 mg/dL   Calcium 9.2 8.9 - 10.3 mg/dL   Total Protein 6.3 (L) 6.5 - 8.1 g/dL   Albumin 2.5 (L) 3.5 - 5.0 g/dL   AST 25 15 - 41 U/L   ALT 17 14 - 54 U/L   Alkaline Phosphatase  82 38 - 126 U/L   Total Bilirubin 0.6 0.3 - 1.2 mg/dL   GFR calc non Af Amer 34 (L) >60 mL/min   GFR calc Af Amer 40 (L) >60 mL/min   Anion gap 14 5 - 15  Troponin I     Status: None   Collection Time: 06/02/17 10:24 PM  Result Value Ref Range   Troponin I <0.03 <0.03 ng/mL  Lipid panel     Status: None   Collection Time: 06/02/17 10:24 PM  Result Value Ref Range   Cholesterol 116 0 - 200 mg/dL   Triglycerides 118 <150 mg/dL   HDL 42 >40 mg/dL   Total CHOL/HDL Ratio 2.8 RATIO   VLDL 24 0 - 40 mg/dL   LDL Cholesterol 50 0 - 99 mg/dL  Brain natriuretic peptide     Status: Abnormal   Collection Time: 06/02/17 10:24 PM  Result Value Ref Range   B Natriuretic Peptide 301.1 (H) 0.0 - 100.0 pg/mL  I-Stat Troponin, ED (not at [facility])     Status: None   Collection Time: 06/02/17 10:29 PM  Result Value Ref Range   Troponin i, poc 0.01 0.00 - 0.08 ng/mL   Comment 3           Dg Chest Portable 1 View  Result Date: 06/02/2017 CLINICAL DATA:  Chest pain and chest pressure EXAM: PORTABLE CHEST 1 VIEW COMPARISON:  05/31/2015 FINDINGS: Normal heart size. Aortic atherosclerosis noted. There is a small right pleural effusion and mild interstitial edema. IMPRESSION: 1. Suspect mild CHF. 2.  Aortic Atherosclerosis (ICD10-I70.0). Electronically Signed   By: Kerby Moors M.D.   On: 06/02/2017 22:48     ASSESSMENT:  Atypical chest pain Old RBBB HTN, Type 2 DM CKD baseline Cr of 1.4-1.8 HFpEF  PLAN/DISCUSSION:  Given the subtle EKG changes, although criteria not met for STEMI, would recommend continuing heparin until 2 sets of troponin's are negative and which time heparin gtt may be d/c. Patient may continue the baby asa and will need initiation of moderate statin therapy given the type 2 DM. Pain may be MSK related. Consider tx pain, but if concerning for cardiac or with trop elevation would recommend repeating an EKG.  She can continue her BP medications and medications may be up titrated for goal HR of 60-80 and BP of < 606 systolic.  Patient may benefit from gentle diuresis with 14m of IV lasix to see if her SOB improves.   Will obtain an Echo in the AM. Check TSH, trend troponin's, A1c and lipid panel.   Cardiology will continue to follow.   Cardiology fellow Harish Devineni   Pt remains chest pain free.  Currently quite stable.  CMs negative thus far.  Continue IV heparin and admission for further CV risk stratification.  JThompson GrayerMD, [facility]one [facility]1/19/2019 12:44 PM

## 2017-06-03 NOTE — ED Notes (Signed)
Report called to [facility].

## 2017-06-03 NOTE — ED Notes (Signed)
CBG 169. 

## 2017-06-03 NOTE — Progress Notes (Signed)
PROGRESS NOTE    [name]  ZTI:[id number] DOB: [birth year] DOA: 06/02/2017 PCP: Patient, No Pcp Per  Outpatient Specialists:     Brief Narrative: Patient is an 83 y.o. female with past medical history significant for insulin-dependent diabetes mellitus, hypertension, chronic kidney disease stage III, hypothyroidism, chronic diastolic CHF, and coronary artery disease. Patient was admitted with chest pain Cardiology input is appreciated. CXR is said to reveal possible mild CHF. Troponin has been stable at 0.03. Will discontinue Heparin drip,  Assessment & Plan:   Principal Problem:   Chest pain Active Problems:   Diabetes mellitus without complication (HCC)   Normocytic anemia   Essential hypertension   CAD (coronary artery disease)   COPD (chronic obstructive pulmonary disease) (HCC)   Diastolic dysfunction, grade II   CKD (chronic kidney disease) stage 3, GFR 30-59 ml/min [facility])   Cardiology is directing chest pain work up Optimize BP Monitor renal function Optimize Blood sugar. Change Lasix to IV Repeat CBC in am (Mild leukocytosis noted)   DVT prophylaxis: [place] Lovenox Code Status: Full Family Communication:  Disposition Plan: Likely home in am   Consultants:   Cardiology  Procedures:   None  Antimicrobials:   None    Subjective: Reproducible chest pain No SOB  Objective: Vitals:   06/03/17 1200 06/03/17 1215 06/03/17 1300 06/03/17 1315  BP: (!) 134/49 (!) 164/59 (!) 173/58 (!) 167/53  Pulse: 74 72 78 78  Resp: (!) 23 16 19 15   SpO2: 94% 95% 93% 95%  Weight:      Height:        Intake/Output Summary (Last 24 hours) at 06/03/2017 1337 Last data filed at 06/03/2017 0139 Gross per 24 hour  Intake 63 ml  Output -  Net 63 ml   Filed Weights   06/02/17 2235  Weight: 83.9 kg (185 lb)    Examination:  General exam: Appears calm and comfortable  Respiratory system: Clear to auscultation.  Cardiovascular system: S1 & S2  heard Gastrointestinal system: Abdomen is obese, soft and non tender. Central nervous system: Alert and oriented. No focal neurological deficits. Extremities: Symmetric 5 x 5 power. Skin: No rashes, lesions or ulcers Psychiatry: Judgement and insight appear normal. Mood & affect appropriate.   Data Reviewed: I have personally reviewed following labs and imaging studies  CBC: Recent Labs  Lab 06/02/17 2224 06/03/17 1010  WBC 13.1* 11.1*  NEUTROABS 9.3* 7.1  HGB 10.2* 10.2*  HCT 32.2* 33.0*  MCV 89.7 89.7  PLT 276 825   Basic Metabolic Panel: Recent Labs  Lab 06/02/17 2224 06/03/17 1010  NA 135 136  K 4.6 4.5  CL 103 105  CO2 18* 20*  GLUCOSE 340* 164*  BUN 45* 43*  CREATININE 1.40* 1.36*  CALCIUM 9.2 9.2   GFR: Estimated Creatinine Clearance: 33.9 mL/min (A) (by C-G formula based on SCr of 1.36 mg/dL (H)). Liver Function Tests: Recent Labs  Lab 06/02/17 2224  AST 25  ALT 17  ALKPHOS 82  BILITOT 0.6  PROT 6.3*  ALBUMIN 2.5*   No results for input(s): LIPASE, AMYLASE in the last 168 hours. No results for input(s): AMMONIA in the last 168 hours. Coagulation Profile: Recent Labs  Lab 06/02/17 2224  INR 1.05   Cardiac Enzymes: Recent Labs  Lab 06/02/17 2224 06/03/17 0146 06/03/17 1010  TROPONINI <0.03 <0.03 <0.03   BNP (last 3 results) No results for input(s): PROBNP in the last 8760 hours. HbA1C: No results for input(s): HGBA1C in the last 72  hours. CBG: Recent Labs  Lab 06/03/17 0153  GLUCAP 338*   Lipid Profile: Recent Labs    06/02/17 2224  CHOL 116  HDL 42  LDLCALC 50  TRIG 118  CHOLHDL 2.8   Thyroid Function Tests: No results for input(s): TSH, T4TOTAL, FREET4, T3FREE, THYROIDAB in the last 72 hours. Anemia Panel: No results for input(s): VITAMINB12, FOLATE, FERRITIN, TIBC, IRON, RETICCTPCT in the last 72 hours. Urine analysis:    Component Value Date/Time   COLORURINE YELLOW 06/03/2015 1530   APPEARANCEUR CLOUDY (A)  06/03/2015 1530   LABSPEC 1.011 06/03/2015 1530   PHURINE 5.5 06/03/2015 1530   GLUCOSEU NEGATIVE 06/03/2015 1530   HGBUR SMALL (A) 06/03/2015 1530   BILIRUBINUR NEGATIVE 06/03/2015 1530   KETONESUR NEGATIVE 06/03/2015 1530   PROTEINUR 100 (A) 06/03/2015 1530   UROBILINOGEN 0.2 10/09/2014 2040   NITRITE NEGATIVE 06/03/2015 1530   LEUKOCYTESUR MODERATE (A) 06/03/2015 1530   Sepsis Labs: @LABRCNTIP (procalcitonin:4,lacticidven:4)  )No results found for this or any previous visit (from the past 240 hour(s)).       Radiology Studies: Dg Chest Portable 1 View  Result Date: 06/02/2017 CLINICAL DATA:  Chest pain and chest pressure EXAM: PORTABLE CHEST 1 VIEW COMPARISON:  05/31/2015 FINDINGS: Normal heart size. Aortic atherosclerosis noted. There is a small right pleural effusion and mild interstitial edema. IMPRESSION: 1. Suspect mild CHF. 2.  Aortic Atherosclerosis (ICD10-I70.0). Electronically Signed   By: Kerby Moors M.D.   On: 06/02/2017 22:48        Scheduled Meds: . acetic acid   Irrigation Daily  . aspirin  81 mg Oral Daily  . B-complex with vitamin C  1 tablet Oral Daily  . carvedilol  12.5 mg Oral BID WC  . cloNIDine  0.1 mg Oral BID  . darifenacin  7.5 mg Oral Daily  . docusate sodium  100 mg Oral Q12H  . escitalopram  5 mg Oral Daily  . furosemide  40 mg Intravenous Q12H  . gabapentin  300 mg Oral TID  . heparin  2,500 Units Intravenous Once  . insulin aspart  0-15 Units Subcutaneous Q4H  . insulin detemir  20 Units Subcutaneous QHS  . levothyroxine  50 mcg Oral QAC breakfast  . loratadine  10 mg Oral Daily  . multivitamin  1 tablet Oral QHS  . rosuvastatin  20 mg Oral QPM  . sodium chloride flush  3 mL Intravenous Q12H   Continuous Infusions: . sodium chloride       LOS: 0 days    Time spent: 40 Mins    Dana Allan, MD  [facility] Pager #: [phone] 7PM-7AM contact night coverage as above

## 2017-06-03 NOTE — Progress Notes (Signed)
ANTICOAGULATION CONSULT NOTE - Initial Consult  Pharmacy Consult for Heparin  Indication: chest pain/ACS  Allergies  Allergen Reactions  . Ace Inhibitors Other (See Comments)    Listed on MAR  . Codeine Other (See Comments)    Listed on MAR  . Lisinopril Other (See Comments)    Listed on MAR  . Norco [Hydrocodone-Acetaminophen] Other (See Comments)    Listed on MAR  . Penicillins Hives    Has patient had a PCN reaction causing immediate rash, facial/tongue/throat swelling, SOB or lightheadedness with hypotension: Yes Has patient had a PCN reaction causing severe rash involving mucus membranes or skin necrosis: Unk Has patient had a PCN reaction that required hospitalization: Unk Has patient had a PCN reaction occurring within the last 10 years: Unk If all of the above answers are "NO", then may proceed with Cephalosporin use.   . Trimethoprim Other (See Comments)    Listed on MAR  . Septra [Sulfamethoxazole-Trimethoprim] Hives and Rash  . Sulfur Hives and Rash   Patient Measurements: Height: 5\' 3"  (160 cm) Weight: 185 lb (83.9 kg) IBW/kg (Calculated) : 52.4  Vital Signs: BP: 132/55 (01/19 0045) Pulse Rate: 65 (01/19 0045)  Labs: Recent Labs    06/02/17 2224  HGB 10.2*  HCT 32.2*  PLT 276  APTT 31  LABPROT 13.6  INR 1.05  CREATININE 1.40*  TROPONINI <0.03    Estimated Creatinine Clearance: 32.9 mL/min (A) (by C-G formula based on SCr of 1.4 mg/dL (H)).   Medical History: Past Medical History:  Diagnosis Date  . Allergic rhinitis   . Anemia    iron deficiency   . Anxiety   . Autonomic neuropathy   . Carotid arterial disease ([place])    a. 09/2013 Carotid U/S: 40-59% bilat ICA stenosis.  . CHF (congestive heart failure) ([place])   . Cholelithiasis   . Chronic pain   . CKD (chronic kidney disease), stage III ([place])   . Constipation   . COPD (chronic obstructive pulmonary disease) ([place])   . Dehydration   . Depression    major depressive disorder  . Diabetic  peripheral neuropathy ([place])   . Enterococcus UTI    a. 09/2013.  Marland Kitchen GERD (gastroesophageal reflux disease)   . H/O echocardiogram    a. 09/2013 Echo: nl LV fxn, no rwma, mildly to mod dil LA, mildly dil RA.  Marland Kitchen Headache(784.0)   . Hypercholesteremia   . Hypertension   . Hypothyroid   . Lack of coordination   . Macular degeneration, bilateral   . Non-obstructive CAD    a. 11/2005 Cath: LM nl, LAD 30 diffuse, LCX small, 20p, 74m/d, 75d into OM, RCA dominant, 40p, 30-40 diffuse, EF 55%.  Marland Kitchen PAD (peripheral artery disease) ([place])    a. 1997 s/p R fem-pop bypass;  b. 03/1999 R CIA stenting.  . Pyelonephritis   . TIA (transient ischemic attack)    a. 09/2013.  . Tobacco abuse   . Type II diabetes mellitus ([place])   . Urinary tract infection    Assessment: 83 y/o F with multiple medical issues initially presents as a CODE STEMI that was cancelled, now starting heparin drip, initial troponin negative, Hgb 10.2, noted renal dysfunction, PTA meds reviewed.   Goal of Therapy:  Heparin level 0.3-0.7 units/ml Monitor platelets by anticoagulation protocol: Yes   Plan:  -Heparin 4000 units was given upon arrival in ED -Will start heparin drip at 850 units/hr -1000 HL -Daily CBC/HL -Monitor for bleeding  Narda Bonds 06/03/2017,1:44 AM

## 2017-06-03 NOTE — ED Notes (Signed)
Report called to 2C 

## 2017-06-04 ENCOUNTER — Other Ambulatory Visit (HOSPITAL_COMMUNITY): Payer: Medicare Other

## 2017-06-04 DIAGNOSIS — I5032 Chronic diastolic (congestive) heart failure: Secondary | ICD-10-CM | POA: Diagnosis not present

## 2017-06-04 DIAGNOSIS — R51 Headache: Secondary | ICD-10-CM | POA: Diagnosis not present

## 2017-06-04 DIAGNOSIS — N390 Urinary tract infection, site not specified: Secondary | ICD-10-CM | POA: Diagnosis not present

## 2017-06-04 DIAGNOSIS — R0789 Other chest pain: Secondary | ICD-10-CM | POA: Diagnosis not present

## 2017-06-04 DIAGNOSIS — I5033 Acute on chronic diastolic (congestive) heart failure: Secondary | ICD-10-CM | POA: Diagnosis not present

## 2017-06-04 DIAGNOSIS — Z794 Long term (current) use of insulin: Secondary | ICD-10-CM | POA: Diagnosis not present

## 2017-06-04 DIAGNOSIS — I25119 Atherosclerotic heart disease of native coronary artery with unspecified angina pectoris: Secondary | ICD-10-CM | POA: Diagnosis not present

## 2017-06-04 DIAGNOSIS — L8990 Pressure ulcer of unspecified site, unspecified stage: Secondary | ICD-10-CM

## 2017-06-04 DIAGNOSIS — I10 Essential (primary) hypertension: Secondary | ICD-10-CM | POA: Diagnosis not present

## 2017-06-04 DIAGNOSIS — N183 Chronic kidney disease, stage 3 (moderate): Secondary | ICD-10-CM | POA: Diagnosis not present

## 2017-06-04 DIAGNOSIS — E11649 Type 2 diabetes mellitus with hypoglycemia without coma: Secondary | ICD-10-CM | POA: Diagnosis not present

## 2017-06-04 DIAGNOSIS — I130 Hypertensive heart and chronic kidney disease with heart failure and stage 1 through stage 4 chronic kidney disease, or unspecified chronic kidney disease: Secondary | ICD-10-CM | POA: Diagnosis not present

## 2017-06-04 DIAGNOSIS — G9341 Metabolic encephalopathy: Secondary | ICD-10-CM | POA: Diagnosis not present

## 2017-06-04 DIAGNOSIS — R079 Chest pain, unspecified: Secondary | ICD-10-CM | POA: Diagnosis not present

## 2017-06-04 LAB — GLUCOSE, CAPILLARY
Glucose-Capillary: 100 mg/dL — ABNORMAL HIGH (ref 65–99)
Glucose-Capillary: 108 mg/dL — ABNORMAL HIGH (ref 65–99)
Glucose-Capillary: 147 mg/dL — ABNORMAL HIGH (ref 65–99)
Glucose-Capillary: 185 mg/dL — ABNORMAL HIGH (ref 65–99)
Glucose-Capillary: 256 mg/dL — ABNORMAL HIGH (ref 65–99)
Glucose-Capillary: 264 mg/dL — ABNORMAL HIGH (ref 65–99)

## 2017-06-04 LAB — CBC WITH DIFFERENTIAL/PLATELET
Basophils Absolute: 0 10*3/uL (ref 0.0–0.1)
Basophils Relative: 0 %
Eosinophils Absolute: 0.1 10*3/uL (ref 0.0–0.7)
Eosinophils Relative: 1 %
HCT: 32.5 % — ABNORMAL LOW (ref 36.0–46.0)
Hemoglobin: 10 g/dL — ABNORMAL LOW (ref 12.0–15.0)
Lymphocytes Relative: 33 %
Lymphs Abs: 3 10*3/uL (ref 0.7–4.0)
MCH: 27.7 pg (ref 26.0–34.0)
MCHC: 30.8 g/dL (ref 30.0–36.0)
MCV: 90 fL (ref 78.0–100.0)
Monocytes Absolute: 0.9 10*3/uL (ref 0.1–1.0)
Monocytes Relative: 10 %
Neutro Abs: 5 10*3/uL (ref 1.7–7.7)
Neutrophils Relative %: 56 %
Platelets: 326 10*3/uL (ref 150–400)
RBC: 3.61 MIL/uL — ABNORMAL LOW (ref 3.87–5.11)
RDW: 13.5 % (ref 11.5–15.5)
WBC: 9.1 10*3/uL (ref 4.0–10.5)

## 2017-06-04 LAB — RENAL FUNCTION PANEL
Albumin: 2.3 g/dL — ABNORMAL LOW (ref 3.5–5.0)
Anion gap: 12 (ref 5–15)
BUN: 44 mg/dL — ABNORMAL HIGH (ref 6–20)
CO2: 22 mmol/L (ref 22–32)
Calcium: 9.3 mg/dL (ref 8.9–10.3)
Chloride: 104 mmol/L (ref 101–111)
Creatinine, Ser: 1.52 mg/dL — ABNORMAL HIGH (ref 0.44–1.00)
GFR calc Af Amer: 36 mL/min — ABNORMAL LOW (ref >60)
GFR calc non Af Amer: 31 mL/min — ABNORMAL LOW (ref >60)
Glucose, Bld: 98 mg/dL (ref 65–99)
Phosphorus: 3.4 mg/dL (ref 2.5–4.6)
Potassium: 4.3 mmol/L (ref 3.5–5.1)
Sodium: 138 mmol/L (ref 135–145)

## 2017-06-04 LAB — URINALYSIS, ROUTINE W REFLEX MICROSCOPIC
Bilirubin Urine: NEGATIVE
Glucose, UA: 50 mg/dL — AB
Ketones, ur: NEGATIVE mg/dL
Nitrite: NEGATIVE
Protein, ur: 100 mg/dL — AB
Specific Gravity, Urine: 1.013 (ref 1.005–1.030)
Squamous Epithelial / LPF: NONE SEEN
pH: 5 (ref 5.0–8.0)

## 2017-06-04 LAB — MRSA PCR SCREENING: MRSA by PCR: NEGATIVE

## 2017-06-04 MED ORDER — REGADENOSON 0.4 MG/5ML IV SOLN
0.4000 mg | Freq: Once | INTRAVENOUS | Status: AC
  Administered 2017-06-05: 0.4 mg via INTRAVENOUS
  Filled 2017-06-04: qty 5

## 2017-06-04 MED ORDER — DEXTROSE 5 % IV SOLN
1.0000 g | INTRAVENOUS | Status: DC
  Administered 2017-06-04 – 2017-06-06 (×3): 1 g via INTRAVENOUS
  Filled 2017-06-04 (×4): qty 10

## 2017-06-04 NOTE — Progress Notes (Signed)
Suprapubic cath bag was changed due to leakage. Urin clear yellow with some sediment.  Has odor. Will collect urine sample as ordered.  Idolina Primer, RN

## 2017-06-04 NOTE — Progress Notes (Signed)
Progress Note   Subjective   Doing well today, the patient denies CP or SOB.  No new concerns  Inpatient Medications    Scheduled Meds: . acetic acid   Irrigation Daily  . aspirin  81 mg Oral Daily  . B-complex with vitamin C  1 tablet Oral Daily  . carvedilol  12.5 mg Oral BID WC  . cloNIDine  0.1 mg Oral BID  . darifenacin  7.5 mg Oral Daily  . docusate sodium  100 mg Oral Q12H  . enoxaparin (LOVENOX) injection  40 mg Subcutaneous Q24H  . escitalopram  5 mg Oral Daily  . furosemide  40 mg Intravenous Q12H  . gabapentin  300 mg Oral TID  . insulin aspart  0-15 Units Subcutaneous Q4H  . insulin detemir  20 Units Subcutaneous QHS  . levothyroxine  50 mcg Oral QAC breakfast  . loratadine  10 mg Oral Daily  . multivitamin  1 tablet Oral QHS  . [START ON 06/05/2017] regadenoson  0.4 mg Intravenous Once  . rosuvastatin  20 mg Oral QPM  . sodium chloride flush  3 mL Intravenous Q12H   Continuous Infusions: . sodium chloride     PRN Meds: sodium chloride, acetaminophen, alum & mag hydroxide-simeth, bisacodyl, HYDROcodone-acetaminophen, nitroGLYCERIN, ondansetron (ZOFRAN) IV, polyethylene glycol, sodium chloride flush   Vital Signs    Vitals:   06/03/17 1821 06/03/17 2043 06/03/17 2200 06/04/17 0428  BP: (!) 170/51 (!) 128/43  (!) 137/48  Pulse: 90 69  67  Resp:  16  14  Temp:  98.5 F (36.9 C)  (!) 97.5 F (36.4 C)  TempSrc:  Oral  Axillary  SpO2:  94%  95%  Weight:   158 lb 11.7 oz (72 kg) 157 lb 14.4 oz (71.6 kg)  Height:        Intake/Output Summary (Last 24 hours) at 06/04/2017 1443 Last data filed at 06/03/2017 2208 Gross per 24 hour  Intake 198 ml  Output 1600 ml  Net -1402 ml   Filed Weights   06/02/17 2235 06/03/17 2200 06/04/17 0428  Weight: 185 lb (83.9 kg) 158 lb 11.7 oz (72 kg) 157 lb 14.4 oz (71.6 kg)    Telemetry    sinus - Personally Reviewed  Physical Exam   GEN- The patient is elderly appearing, alert, poor historian Head-  normocephalic, atraumatic Eyes-  Sclera clear, conjunctiva pink Ears- hearing intact Oropharynx- clear Neck- supple, Lungs- Clear to ausculation bilaterally, normal work of breathing Heart- Regular rate and rhythm  GI- soft, NT, ND, + BS Extremities- no clubbing, cyanosis, or edema  MS-diffuse atrophy Skin- no rash or lesion Psych- poor historian Neuro- strength and sensation are intact   Labs    Chemistry Recent Labs  Lab 06/02/17 2224 06/03/17 1010 06/04/17 0639  NA 135 136 138  K 4.6 4.5 4.3  CL 103 105 104  CO2 18* 20* 22  GLUCOSE 340* 164* 98  BUN 45* 43* 44*  CREATININE 1.40* 1.36* 1.52*  CALCIUM 9.2 9.2 9.3  PROT 6.3*  --   --   ALBUMIN 2.5*  --  2.3*  AST 25  --   --   ALT 17  --   --   ALKPHOS 82  --   --   BILITOT 0.6  --   --   GFRNONAA 34* 36* 31*  GFRAA 40* 41* 36*  ANIONGAP 14 11 12      Hematology Recent Labs  Lab 06/02/17 2224 06/03/17 1010 06/04/17 1540  WBC 13.1* 11.1* 9.1  RBC 3.59* 3.68* 3.61*  HGB 10.2* 10.2* 10.0*  HCT 32.2* 33.0* 32.5*  MCV 89.7 89.7 90.0  MCH 28.4 27.7 27.7  MCHC 31.7 30.9 30.8  RDW 13.3 13.4 13.5  PLT 276 272 326    Cardiac Enzymes Recent Labs  Lab 06/02/17 2224 06/03/17 0146 06/03/17 1010 06/03/17 1312  TROPONINI <0.03 <0.03 <0.03 <0.03    Recent Labs  Lab 06/02/17 2229  TROPIPOC 0.01        Assessment & Plan    1.  Chest pain Poor historian Likely noncardiac Given difficulty with history and comorbidities, I think that lexiscan would be beneficial for further CV risk stratification.  If low to moderate risk, would treat medically  2. Acute on chronic diastolic dysfuntion Gentle diuresis Echo pending  3. HTN Stable No change required today  myoview in am (she has eaten today).  If low to moderate risk, would treat medically.  Hopefully to discharge tomorrow pending myoview/ echo results  Thompson Grayer MD, [facility] 06/04/2017 9:37 AM

## 2017-06-04 NOTE — Care Management Obs Status (Signed)
[facility] NOTIFICATION   Patient Details  Name: [name] MRN: [id number] Date of Birth: 18-Nov-1934   Medicare Observation Status Notification Given:  Yes    Arley Phenix, RN 06/04/2017, 10:39 AM

## 2017-06-04 NOTE — Progress Notes (Signed)
PROGRESS NOTE    [name]  IZT:[id number] DOB: Sep 08, 1934 DOA: 06/02/2017 PCP: Patient, No Pcp Per  Outpatient Specialists:     Brief Narrative: Patient is an 83 y.o. female with past medical history significant for insulin-dependent diabetes mellitus, hypertension, chronic kidney disease stage III, hypothyroidism, chronic diastolic CHF, and coronary artery disease. Patient was admitted with chest pain Cardiology input is appreciated. CXR is said to reveal possible mild CHF. Troponin has been stable at 0.03.  Heparin drip discontinued.    06/04/17 -patient seen today.  No new complaints.  Cardiology input is highly appreciated.  The plan is for cardiac stress test in the morning.  Foul smelling urine is noted.  Will proceed with urinalysis and urine culture.  Likely, the patient will be discharged back home if the cardiac stress test is non-revealing.  Assessment & Plan:   Principal Problem:   Chest pain Active Problems:   Diabetes mellitus without complication (HCC)   Normocytic anemia   Essential hypertension   CAD (coronary artery disease)   COPD (chronic obstructive pulmonary disease) (HCC)   Diastolic dysfunction, grade II   CKD (chronic kidney disease) stage 3, GFR 30-59 ml/min (HCC)   Pressure injury of skin   Cardiology is directing chest pain work up For cardiac stress test in the morning. Urinalysis and urine culture. Optimize BP Monitor renal function Optimize Blood sugar. Change Lasix to IV Repeat CBC in am (Mild leukocytosis noted)   DVT prophylaxis: [place] Lovenox Code Status: Full Family Communication:  Disposition Plan: Likely home in am   Consultants:   Cardiology  Procedures:   None  Antimicrobials:   None    Subjective: Reproducible chest pain No SOB  Objective: Vitals:   06/03/17 2043 06/03/17 2200 06/04/17 0428 06/04/17 0948  BP: (!) 128/43  (!) 137/48 (!) 125/50  Pulse: 69  67 67  Resp: 16  14   Temp: 98.5 F (36.9 C)  (!)  97.5 F (36.4 C)   TempSrc: Oral  Axillary   SpO2: 94%  95%   Weight:  72 kg (158 lb 11.7 oz) 71.6 kg (157 lb 14.4 oz)   Height:        Intake/Output Summary (Last 24 hours) at 06/04/2017 1341 Last data filed at 06/04/2017 1100 Gross per 24 hour  Intake 438 ml  Output 1950 ml  Net -1512 ml   Filed Weights   06/02/17 2235 06/03/17 2200 06/04/17 0428  Weight: 83.9 kg (185 lb) 72 kg (158 lb 11.7 oz) 71.6 kg (157 lb 14.4 oz)    Examination:  General exam: Appears calm and comfortable  Respiratory system: Clear to auscultation.  Cardiovascular system: S1 & S2 heard Gastrointestinal system: Abdomen is obese, soft and non tender. Central nervous system: Alert and oriented. No focal neurological deficits. Extremities: Symmetric 5 x 5 power. Skin: No rashes, lesions or ulcers Psychiatry: Judgement and insight appear normal. Mood & affect appropriate.   Data Reviewed: I have personally reviewed following labs and imaging studies  CBC: Recent Labs  Lab 06/02/17 2224 06/03/17 1010 06/04/17 0639  WBC 13.1* 11.1* 9.1  NEUTROABS 9.3* 7.1 5.0  HGB 10.2* 10.2* 10.0*  HCT 32.2* 33.0* 32.5*  MCV 89.7 89.7 90.0  PLT 276 272 382   Basic Metabolic Panel: Recent Labs  Lab 06/02/17 2224 06/03/17 1010 06/04/17 0639  NA 135 136 138  K 4.6 4.5 4.3  CL 103 105 104  CO2 18* 20* 22  GLUCOSE 340* 164* 98  BUN 45*  43* 44*  CREATININE 1.40* 1.36* 1.52*  CALCIUM 9.2 9.2 9.3  PHOS  --   --  3.4   GFR: Estimated Creatinine Clearance: 28 mL/min (A) (by C-G formula based on SCr of 1.52 mg/dL (H)). Liver Function Tests: Recent Labs  Lab 06/02/17 2224 06/04/17 0639  AST 25  --   ALT 17  --   ALKPHOS 82  --   BILITOT 0.6  --   PROT 6.3*  --   ALBUMIN 2.5* 2.3*   No results for input(s): LIPASE, AMYLASE in the last 168 hours. No results for input(s): AMMONIA in the last 168 hours. Coagulation Profile: Recent Labs  Lab 06/02/17 2224  INR 1.05   Cardiac Enzymes: Recent Labs    Lab 06/02/17 2224 06/03/17 0146 06/03/17 1010 06/03/17 1312  TROPONINI <0.03 <0.03 <0.03 <0.03   BNP (last 3 results) No results for input(s): PROBNP in the last 8760 hours. HbA1C: No results for input(s): HGBA1C in the last 72 hours. CBG: Recent Labs  Lab 06/03/17 2048 06/04/17 0025 06/04/17 0437 06/04/17 0734 06/04/17 1139  GLUCAP 230* 147* 108* 100* 256*   Lipid Profile: Recent Labs    06/02/17 2224  CHOL 116  HDL 42  LDLCALC 50  TRIG 118  CHOLHDL 2.8   Thyroid Function Tests: No results for input(s): TSH, T4TOTAL, FREET4, T3FREE, THYROIDAB in the last 72 hours. Anemia Panel: No results for input(s): VITAMINB12, FOLATE, FERRITIN, TIBC, IRON, RETICCTPCT in the last 72 hours. Urine analysis:    Component Value Date/Time   COLORURINE YELLOW 06/03/2015 1530   APPEARANCEUR CLOUDY (A) 06/03/2015 1530   LABSPEC 1.011 06/03/2015 1530   PHURINE 5.5 06/03/2015 1530   GLUCOSEU NEGATIVE 06/03/2015 1530   HGBUR SMALL (A) 06/03/2015 1530   BILIRUBINUR NEGATIVE 06/03/2015 1530   KETONESUR NEGATIVE 06/03/2015 1530   PROTEINUR 100 (A) 06/03/2015 1530   UROBILINOGEN 0.2 10/09/2014 2040   NITRITE NEGATIVE 06/03/2015 1530   LEUKOCYTESUR MODERATE (A) 06/03/2015 1530   Sepsis Labs: @LABRCNTIP (procalcitonin:4,lacticidven:4)  )No results found for this or any previous visit (from the past 240 hour(s)).       Radiology Studies: Dg Chest Portable 1 View  Result Date: 06/02/2017 CLINICAL DATA:  Chest pain and chest pressure EXAM: PORTABLE CHEST 1 VIEW COMPARISON:  05/31/2015 FINDINGS: Normal heart size. Aortic atherosclerosis noted. There is a small right pleural effusion and mild interstitial edema. IMPRESSION: 1. Suspect mild CHF. 2.  Aortic Atherosclerosis (ICD10-I70.0). Electronically Signed   By: Kerby Moors M.D.   On: 06/02/2017 22:48        Scheduled Meds: . acetic acid   Irrigation Daily  . aspirin  81 mg Oral Daily  . B-complex with vitamin C  1 tablet  Oral Daily  . carvedilol  12.5 mg Oral BID WC  . cloNIDine  0.1 mg Oral BID  . darifenacin  7.5 mg Oral Daily  . docusate sodium  100 mg Oral Q12H  . enoxaparin (LOVENOX) injection  40 mg Subcutaneous Q24H  . escitalopram  5 mg Oral Daily  . furosemide  40 mg Intravenous Q12H  . gabapentin  300 mg Oral TID  . insulin aspart  0-15 Units Subcutaneous Q4H  . insulin detemir  20 Units Subcutaneous QHS  . levothyroxine  50 mcg Oral QAC breakfast  . loratadine  10 mg Oral Daily  . multivitamin  1 tablet Oral QHS  . [START ON 06/05/2017] regadenoson  0.4 mg Intravenous Once  . rosuvastatin  20 mg Oral QPM  .  sodium chloride flush  3 mL Intravenous Q12H   Continuous Infusions: . sodium chloride       LOS: 0 days    Time spent: 30 Mins    Dana Allan, MD  [facility] Pager #: [phone] 7PM-7AM contact night coverage as above

## 2017-06-05 ENCOUNTER — Observation Stay (HOSPITAL_COMMUNITY): Payer: Medicare Other

## 2017-06-05 ENCOUNTER — Other Ambulatory Visit: Payer: Self-pay

## 2017-06-05 DIAGNOSIS — R072 Precordial pain: Secondary | ICD-10-CM

## 2017-06-05 DIAGNOSIS — R079 Chest pain, unspecified: Secondary | ICD-10-CM

## 2017-06-05 DIAGNOSIS — I361 Nonrheumatic tricuspid (valve) insufficiency: Secondary | ICD-10-CM | POA: Diagnosis not present

## 2017-06-05 DIAGNOSIS — R0789 Other chest pain: Secondary | ICD-10-CM | POA: Diagnosis not present

## 2017-06-05 DIAGNOSIS — R51 Headache: Secondary | ICD-10-CM | POA: Diagnosis not present

## 2017-06-05 DIAGNOSIS — N390 Urinary tract infection, site not specified: Secondary | ICD-10-CM

## 2017-06-05 DIAGNOSIS — I25118 Atherosclerotic heart disease of native coronary artery with other forms of angina pectoris: Secondary | ICD-10-CM | POA: Diagnosis not present

## 2017-06-05 LAB — ECHOCARDIOGRAM COMPLETE
Height: 63 in
WEIGHTICAEL: 2512 [oz_av]

## 2017-06-05 LAB — NM MYOCAR MULTI W/SPECT W/WALL MOTION / EF
CHL CUP MPHR: 140 {beats}/min
CHL CUP RESTING HR STRESS: 55 {beats}/min
CSEPED: 0 min
Estimated workload: 1 METS
Exercise duration (sec): 0 s
Peak HR: 65 {beats}/min
Percent HR: 46 %
RPE: 0

## 2017-06-05 LAB — URINE CULTURE

## 2017-06-05 LAB — GLUCOSE, CAPILLARY
Glucose-Capillary: 119 mg/dL — ABNORMAL HIGH (ref 65–99)
Glucose-Capillary: 166 mg/dL — ABNORMAL HIGH (ref 65–99)
Glucose-Capillary: 208 mg/dL — ABNORMAL HIGH (ref 65–99)
Glucose-Capillary: 215 mg/dL — ABNORMAL HIGH (ref 65–99)
Glucose-Capillary: 287 mg/dL — ABNORMAL HIGH (ref 65–99)
Glucose-Capillary: 93 mg/dL (ref 65–99)

## 2017-06-05 MED ORDER — TECHNETIUM TC 99M TETROFOSMIN IV KIT
30.0000 | PACK | Freq: Once | INTRAVENOUS | Status: AC | PRN
  Administered 2017-06-05: 30 via INTRAVENOUS

## 2017-06-05 MED ORDER — REGADENOSON 0.4 MG/5ML IV SOLN
INTRAVENOUS | Status: AC
  Administered 2017-06-05: 0.4 mg via INTRAVENOUS
  Filled 2017-06-05: qty 5

## 2017-06-05 MED ORDER — FUROSEMIDE 40 MG PO TABS
40.0000 mg | ORAL_TABLET | Freq: Every day | ORAL | Status: DC
  Administered 2017-06-05 – 2017-06-06 (×2): 40 mg via ORAL
  Filled 2017-06-05 (×2): qty 1

## 2017-06-05 MED ORDER — GLUCERNA SHAKE PO LIQD: 237.0000 mL | Freq: Two times a day (BID) | ORAL | Status: DC

## 2017-06-05 MED ORDER — TECHNETIUM TC 99M TETROFOSMIN IV KIT
10.0000 | PACK | Freq: Once | INTRAVENOUS | Status: AC | PRN
  Administered 2017-06-05: 10 via INTRAVENOUS

## 2017-06-05 MED ORDER — ENOXAPARIN SODIUM 30 MG/0.3ML ~~LOC~~ SOLN
30.0000 mg | SUBCUTANEOUS | Status: DC
  Administered 2017-06-05: 30 mg via SUBCUTANEOUS
  Filled 2017-06-05: qty 0.3

## 2017-06-05 NOTE — Progress Notes (Signed)
Progress Note  Patient Name: [name] Date of Encounter: 06/05/2017  Primary Cardiologist:   No primary care provider on file.   Subjective   She denies any chest pain but she says that she is SOB.   Inpatient Medications    Scheduled Meds: . acetic acid   Irrigation Daily  . aspirin  81 mg Oral Daily  . B-complex with vitamin C  1 tablet Oral Daily  . carvedilol  12.5 mg Oral BID WC  . cloNIDine  0.1 mg Oral BID  . darifenacin  7.5 mg Oral Daily  . docusate sodium  100 mg Oral Q12H  . enoxaparin (LOVENOX) injection  40 mg Subcutaneous Q24H  . escitalopram  5 mg Oral Daily  . furosemide  40 mg Intravenous Q12H  . gabapentin  300 mg Oral TID  . insulin aspart  0-15 Units Subcutaneous Q4H  . insulin detemir  20 Units Subcutaneous QHS  . levothyroxine  50 mcg Oral QAC breakfast  . loratadine  10 mg Oral Daily  . multivitamin  1 tablet Oral QHS  . regadenoson  0.4 mg Intravenous Once  . rosuvastatin  20 mg Oral QPM  . sodium chloride flush  3 mL Intravenous Q12H   Continuous Infusions: . sodium chloride    . cefTRIAXone (ROCEPHIN)  IV Stopped (06/04/17 2100)   PRN Meds: sodium chloride, acetaminophen, alum & mag hydroxide-simeth, bisacodyl, HYDROcodone-acetaminophen, nitroGLYCERIN, ondansetron (ZOFRAN) IV, polyethylene glycol, sodium chloride flush   Vital Signs    Vitals:   06/04/17 1633 06/04/17 2027 06/05/17 0418 06/05/17 0809  BP: (!) 129/43 (!) 129/44 (!) 120/42 (!) 139/41  Pulse: 63  (!) 58 (!) 58  Resp:  20 18   Temp:  98 F (36.7 C) 99.1 F (37.3 C) 98.2 F (36.8 C)  TempSrc:  Axillary Axillary Axillary  SpO2:  93% 92% 94%  Weight:   157 lb (71.2 kg)   Height:        Intake/Output Summary (Last 24 hours) at 06/05/2017 0836 Last data filed at 06/05/2017 0433 Gross per 24 hour  Intake 1060 ml  Output 750 ml  Net 310 ml   Filed Weights   06/03/17 2200 06/04/17 0428 06/05/17 0418  Weight: 158 lb 11.7 oz (72 kg) 157 lb 14.4 oz (71.6 kg) 157 lb  (71.2 kg)    Telemetry    NSR - Personally Reviewed  ECG    NA - Personally Reviewed  Physical Exam   GEN: No acute distress.  Frail Neck: No  JVD Cardiac: RRR, no murmurs, rubs, or gallops.  Respiratory: Clear  to auscultation bilaterally. GI: Soft, nontender, non-distended  MS: No  edema; No deformity. Neuro:  Nonfocal  Psych: Normal affect   Labs    Chemistry Recent Labs  Lab 06/02/17 2224 06/03/17 1010 06/04/17 0639  NA 135 136 138  K 4.6 4.5 4.3  CL 103 105 104  CO2 18* 20* 22  GLUCOSE 340* 164* 98  BUN 45* 43* 44*  CREATININE 1.40* 1.36* 1.52*  CALCIUM 9.2 9.2 9.3  PROT 6.3*  --   --   ALBUMIN 2.5*  --  2.3*  AST 25  --   --   ALT 17  --   --   ALKPHOS 82  --   --   BILITOT 0.6  --   --   GFRNONAA 34* 36* 31*  GFRAA 40* 41* 36*  ANIONGAP 14 11 12      Hematology Recent Labs  Lab 06/02/17 2224 06/03/17 1010 06/04/17 0639  WBC 13.1* 11.1* 9.1  RBC 3.59* 3.68* 3.61*  HGB 10.2* 10.2* 10.0*  HCT 32.2* 33.0* 32.5*  MCV 89.7 89.7 90.0  MCH 28.4 27.7 27.7  MCHC 31.7 30.9 30.8  RDW 13.3 13.4 13.5  PLT 276 272 326    Cardiac Enzymes Recent Labs  Lab 06/02/17 2224 06/03/17 0146 06/03/17 1010 06/03/17 1312  TROPONINI <0.03 <0.03 <0.03 <0.03    Recent Labs  Lab 06/02/17 2229  TROPIPOC 0.01     BNP Recent Labs  Lab 06/02/17 2224  BNP 301.1*     DDimer No results for input(s): DDIMER in the last 168 hours.   Radiology    No results found.  Cardiac Studies   Echo and Lexiscan Myoview pending.   Patient Profile     83 y.o. female without prior cardiac history.  We are asked to see in consultation for evaluation of chest pain.   Assessment & Plan    CHEST PAIN:  She denies chest pain currently.  Lexiscan Myoview today.  Enzymes negative.   HTN:  BP is reasonably controlled.    RBBB:  Chronic.    CHRONIC DIASTOLIC HF:   Negative one liter since admission. Change to PO Lasix.     For questions or updates, please contact  [place] Please consult [email or web address] for contact info under Cardiology/STEMI.   Signed, Minus Breeding, MD  06/05/2017, 8:36 AM

## 2017-06-05 NOTE — Progress Notes (Signed)
PROGRESS NOTE    [name]  ENI:[id number] DOB: 1934/06/15 DOA: 06/02/2017 PCP: Patient, No Pcp Per  Outpatient Specialists:     Brief Narrative: Patient is an 83 y.o. female with past medical history significant for insulin-dependent diabetes mellitus, hypertension, chronic kidney disease stage III, hypothyroidism, chronic diastolic CHF, and coronary artery disease. Patient was admitted with chest pain Cardiology input is appreciated. CXR is said to reveal possible mild CHF. Troponin has been stable at 0.03.  Heparin drip discontinued.    06/04/17 -patient seen today.  No new complaints.  Cardiology input is highly appreciated.  The plan is for cardiac stress test in the morning.  Foul smelling urine is noted.  Will proceed with urinalysis and urine culture.  Likely, the patient will be discharged back home if the cardiac stress test is non-revealing.  06/05/17 - Cardiac work up in progress. No chest pain. Cardiology input is appreciated. Follow final urine culture. Continue antibiotics for now. Patient reports having a headache.   Assessment & Plan:   Principal Problem:   Chest pain Active Problems:   Diabetes mellitus without complication (HCC)   Normocytic anemia   Essential hypertension   CAD (coronary artery disease)   COPD (chronic obstructive pulmonary disease) (HCC)   Diastolic dysfunction, grade II   CKD (chronic kidney disease) stage 3, GFR 30-59 ml/min (HCC)   Pressure injury of skin   Cardiology is directing chest pain work up For cardiac stress test in the morning. Urinalysis and urine culture. Optimize BP Monitor renal function Optimize Blood sugar. Change Lasix to IV Repeat CBC in am (Mild leukocytosis noted)   DVT prophylaxis: [place] Lovenox Code Status: Full Family Communication:  Disposition Plan: Likely home in am   Consultants:   Cardiology  Procedures:   None  Antimicrobials:   None    Subjective: Reproducible chest pain No  SOB  Objective: Vitals:   06/05/17 1301 06/05/17 1303 06/05/17 1306 06/05/17 1651  BP: (!) 121/33 (!) 174/33 (!) 160/61 (!) 136/44  Pulse:    (!) 55  Resp:      Temp:    97.8 F (36.6 C)  TempSrc:    Oral  SpO2:    92%  Weight:      Height:        Intake/Output Summary (Last 24 hours) at 06/05/2017 1913 Last data filed at 06/05/2017 1802 Gross per 24 hour  Intake 580 ml  Output 800 ml  Net -220 ml   Filed Weights   06/03/17 2200 06/04/17 0428 06/05/17 0418  Weight: 72 kg (158 lb 11.7 oz) 71.6 kg (157 lb 14.4 oz) 71.2 kg (157 lb)    Examination:  General exam: Appears calm and comfortable  Respiratory system: Clear to auscultation.  Cardiovascular system: S1 & S2 heard Gastrointestinal system: Abdomen is obese, soft and non tender. Central nervous system: Alert and oriented. No focal neurological deficits. Extremities: Symmetric 5 x 5 power. Skin: No rashes, lesions or ulcers Psychiatry: Judgement and insight appear normal. Mood & affect appropriate.   Data Reviewed: I have personally reviewed following labs and imaging studies  CBC: Recent Labs  Lab 06/02/17 2224 06/03/17 1010 06/04/17 0639  WBC 13.1* 11.1* 9.1  NEUTROABS 9.3* 7.1 5.0  HGB 10.2* 10.2* 10.0*  HCT 32.2* 33.0* 32.5*  MCV 89.7 89.7 90.0  PLT 276 272 614   Basic Metabolic Panel: Recent Labs  Lab 06/02/17 2224 06/03/17 1010 06/04/17 0639  NA 135 136 138  K 4.6 4.5 4.3  CL 103 105 104  CO2 18* 20* 22  GLUCOSE 340* 164* 98  BUN 45* 43* 44*  CREATININE 1.40* 1.36* 1.52*  CALCIUM 9.2 9.2 9.3  PHOS  --   --  3.4   GFR: Estimated Creatinine Clearance: 27.9 mL/min (A) (by C-G formula based on SCr of 1.52 mg/dL (H)). Liver Function Tests: Recent Labs  Lab 06/02/17 2224 06/04/17 0639  AST 25  --   ALT 17  --   ALKPHOS 82  --   BILITOT 0.6  --   PROT 6.3*  --   ALBUMIN 2.5* 2.3*   No results for input(s): LIPASE, AMYLASE in the last 168 hours. No results for input(s): AMMONIA in the  last 168 hours. Coagulation Profile: Recent Labs  Lab 06/02/17 2224  INR 1.05   Cardiac Enzymes: Recent Labs  Lab 06/02/17 2224 06/03/17 0146 06/03/17 1010 06/03/17 1312  TROPONINI <0.03 <0.03 <0.03 <0.03   BNP (last 3 results) No results for input(s): PROBNP in the last 8760 hours. HbA1C: No results for input(s): HGBA1C in the last 72 hours. CBG: Recent Labs  Lab 06/04/17 2049 06/05/17 0006 06/05/17 0425 06/05/17 0739 06/05/17 1648  GLUCAP 185* 215* 119* 93 166*   Lipid Profile: Recent Labs    06/02/17 2224  CHOL 116  HDL 42  LDLCALC 50  TRIG 118  CHOLHDL 2.8   Thyroid Function Tests: No results for input(s): TSH, T4TOTAL, FREET4, T3FREE, THYROIDAB in the last 72 hours. Anemia Panel: No results for input(s): VITAMINB12, FOLATE, FERRITIN, TIBC, IRON, RETICCTPCT in the last 72 hours. Urine analysis:    Component Value Date/Time   COLORURINE AMBER (A) 06/04/2017 1448   APPEARANCEUR CLOUDY (A) 06/04/2017 1448   LABSPEC 1.013 06/04/2017 1448   PHURINE 5.0 06/04/2017 1448   GLUCOSEU 50 (A) 06/04/2017 1448   HGBUR SMALL (A) 06/04/2017 1448   BILIRUBINUR NEGATIVE 06/04/2017 1448   KETONESUR NEGATIVE 06/04/2017 1448   PROTEINUR 100 (A) 06/04/2017 1448   UROBILINOGEN 0.2 10/09/2014 2040   NITRITE NEGATIVE 06/04/2017 1448   LEUKOCYTESUR LARGE (A) 06/04/2017 1448   Sepsis Labs: @LABRCNTIP (procalcitonin:4,lacticidven:4)  ) Recent Results (from the past 240 hour(s))  Culture, Urine     Status: Abnormal   Collection Time: 06/04/17  2:48 PM  Result Value Ref Range Status   Specimen Description URINE, RANDOM  Final   Special Requests NONE  Final   Culture MULTIPLE SPECIES PRESENT, SUGGEST RECOLLECTION (A)  Final   Report Status 06/05/2017 FINAL  Final  MRSA PCR Screening     Status: None   Collection Time: 06/04/17  2:50 PM  Result Value Ref Range Status   MRSA by PCR NEGATIVE NEGATIVE Final    Comment:        The GeneXpert MRSA Assay (FDA approved for  NASAL specimens only), is one component of a comprehensive MRSA colonization surveillance program. It is not intended to diagnose MRSA infection nor to guide or monitor treatment for MRSA infections.          Radiology Studies: Nm Myocar Multi W/spect W/wall Motion / Ef  Result Date: 06/05/2017 CLINICAL DATA:  Acute coronary syndrome. EXAM: MYOCARDIAL IMAGING WITH SPECT (REST AND PHARMACOLOGIC-STRESS) GATED LEFT VENTRICULAR WALL MOTION STUDY LEFT VENTRICULAR EJECTION FRACTION TECHNIQUE: Standard myocardial SPECT imaging was performed after resting intravenous injection of 10 mCi Tc-97m tetrofosmin. Subsequently, intravenous infusion of Lexiscan was performed under the supervision of the Cardiology staff. At peak effect of the drug, 30 mCi Tc-52m tetrofosmin was injected intravenously and standard  myocardial SPECT imaging was performed. Quantitative gated imaging was also performed to evaluate left ventricular wall motion, and estimate left ventricular ejection fraction. COMPARISON:  None. FINDINGS: Perfusion: There is a fixed defects noted in the inferolateral wall compatible with old infarct/scar. No significant reversibility to suggest inducible ischemia. Wall Motion: Slight loss of wall motion and thickening in the area of scar. Otherwise unremarkable. Left Ventricular Ejection Fraction: 49 % End diastolic volume 66 ml End systolic volume 34 ml IMPRESSION: 1. 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 No ischemia. 2. Slight decreased wall motion in the area of scar. 3. Left ventricular ejection fraction 49% 4. Non invasive risk stratification*: Intermediate *2012 Appropriate Use Criteria for Coronary Revascularization Focused Update: J Am Coll Cardiol. 4917;91(5):056-979. http://content.[email or web address]?articleid=[email or web address] Electronically Signed   By: Rolm Baptise M.D.   On: 06/05/2017 14:51        Scheduled Meds: . acetic acid   Irrigation Daily  . aspirin   81 mg Oral Daily  . B-complex with vitamin C  1 tablet Oral Daily  . carvedilol  12.5 mg Oral BID WC  . cloNIDine  0.1 mg Oral BID  . darifenacin  7.5 mg Oral Daily  . docusate sodium  100 mg Oral Q12H  . enoxaparin (LOVENOX) injection  30 mg Subcutaneous Q24H  . escitalopram  5 mg Oral Daily  . [START ON 06/06/2017] feeding supplement (GLUCERNA SHAKE)  237 mL Oral BID BM  . furosemide  40 mg Oral Daily  . gabapentin  300 mg Oral TID  . insulin aspart  0-15 Units Subcutaneous Q4H  . insulin detemir  20 Units Subcutaneous QHS  . levothyroxine  50 mcg Oral QAC breakfast  . loratadine  10 mg Oral Daily  . multivitamin  1 tablet Oral QHS  . rosuvastatin  20 mg Oral QPM  . sodium chloride flush  3 mL Intravenous Q12H   Continuous Infusions: . sodium chloride    . cefTRIAXone (ROCEPHIN)  IV Stopped (06/04/17 2100)     LOS: 2 days    Time spent: 25 Mins    Dana Allan, MD  [facility] Pager #: [phone] 7PM-7AM contact night coverage as above

## 2017-06-05 NOTE — Progress Notes (Signed)
  Echocardiogram 2D Echocardiogram has been performed.  Jahmari Esbenshade T Erandy Mceachern 06/05/2017, 4:34 PM

## 2017-06-05 NOTE — Progress Notes (Addendum)
Initial Nutrition Assessment  DOCUMENTATION CODES:   Not applicable  INTERVENTION:    Glucerna Shake po BID, each supplement provides 220 kcal and 10 grams of protein  NUTRITION DIAGNOSIS:   Increased nutrient needs related to wound healing as evidenced by estimated needs.  GOAL:   Patient will meet greater than or equal to 90% of their needs  MONITOR:   PO intake, Supplement acceptance  REASON FOR ASSESSMENT:   Malnutrition Screening Tool    ASSESSMENT:   83 yo female with PMH of HTN, macular degeneration, DM-2, COPD, peripheral neuropathy, HLD, PAD, CKD, hypothyroidism, TIA, tobacco abuse who was admitted on 1/18 with chest pain.  S/P nuclear stress test earlier today. Results pending. Patient reports good intake PTA. She has lost ~10 lbs in the past year and ~15 lbs in the year prior to that. 6% weight loss in one year is not significant for the time frame.  Meal completion 50-100% of meals. Patient has been drinking Ensure 1-2 times daily. Labs and medications reviewed. CBG's: 215-119-93  NUTRITION - FOCUSED PHYSICAL EXAM:    Most Recent Value  Orbital Region  No depletion  Upper Arm Region  No depletion  Thoracic and Lumbar Region  No depletion  Buccal Region  No depletion  Temple Region  Mild depletion  Clavicle Bone Region  Mild depletion  Clavicle and Acromion Bone Region  No depletion  Scapular Bone Region  No depletion  Dorsal Hand  No depletion  Patellar Region  No depletion  Anterior Thigh Region  Mild depletion  Posterior Calf Region  No depletion  Edema (RD Assessment)  None  Hair  Reviewed  Eyes  Reviewed  Mouth  Reviewed  Skin  Reviewed  Nails  Reviewed       Diet Order:  Diet heart healthy/carb modified Room service appropriate? Yes; Fluid consistency: Thin  EDUCATION NEEDS:   No education needs have been identified at this time  Skin:  Skin Assessment: Skin Integrity Issues: Skin Integrity Issues:: DTI, Stage I DTI:  buttocks Stage I: buttocks  Last BM:  1/18  Height:   Ht Readings from Last 1 Encounters:  06/02/17 5\' 3"  (1.6 m)    Weight:   Wt Readings from Last 1 Encounters:  06/05/17 157 lb (71.2 kg)    Ideal Body Weight:  52.3 kg  BMI:  Body mass index is 27.81 kg/m.  Estimated Nutritional Needs:   Kcal:  1500-1700  Protein:  75-85 gm  Fluid:  1.5-1.7 L   Molli Barrows, RD, LDN, [place] Pager [phone] After Hours Pager [phone]

## 2017-06-05 NOTE — Progress Notes (Signed)
lexiscan portion of nuc completed.  Results pending

## 2017-06-05 NOTE — Progress Notes (Signed)
Patient to [facility] via bed.  Telemetry removed and CTM notified.

## 2017-06-06 DIAGNOSIS — R0789 Other chest pain: Secondary | ICD-10-CM | POA: Diagnosis not present

## 2017-06-06 DIAGNOSIS — E11649 Type 2 diabetes mellitus with hypoglycemia without coma: Secondary | ICD-10-CM

## 2017-06-06 DIAGNOSIS — I25118 Atherosclerotic heart disease of native coronary artery with other forms of angina pectoris: Secondary | ICD-10-CM | POA: Diagnosis not present

## 2017-06-06 DIAGNOSIS — I5032 Chronic diastolic (congestive) heart failure: Secondary | ICD-10-CM | POA: Diagnosis not present

## 2017-06-06 DIAGNOSIS — Z794 Long term (current) use of insulin: Secondary | ICD-10-CM

## 2017-06-06 DIAGNOSIS — G9341 Metabolic encephalopathy: Secondary | ICD-10-CM | POA: Diagnosis not present

## 2017-06-06 DIAGNOSIS — I130 Hypertensive heart and chronic kidney disease with heart failure and stage 1 through stage 4 chronic kidney disease, or unspecified chronic kidney disease: Secondary | ICD-10-CM | POA: Diagnosis not present

## 2017-06-06 DIAGNOSIS — N390 Urinary tract infection, site not specified: Secondary | ICD-10-CM | POA: Diagnosis not present

## 2017-06-06 DIAGNOSIS — R079 Chest pain, unspecified: Secondary | ICD-10-CM | POA: Diagnosis not present

## 2017-06-06 LAB — GLUCOSE, CAPILLARY
Glucose-Capillary: 155 mg/dL — ABNORMAL HIGH (ref 65–99)
Glucose-Capillary: 37 mg/dL — CL (ref 65–99)
Glucose-Capillary: 97 mg/dL (ref 65–99)
Glucose-Capillary: 157 mg/dL — ABNORMAL HIGH (ref 65–99)
Glucose-Capillary: 154 mg/dL — ABNORMAL HIGH (ref 65–99)
Glucose-Capillary: 157 mg/dL — ABNORMAL HIGH (ref 65–99)
Glucose-Capillary: 254 mg/dL — ABNORMAL HIGH (ref 65–99)
Glucose-Capillary: 330 mg/dL — ABNORMAL HIGH (ref 65–99)

## 2017-06-06 MED ORDER — FUROSEMIDE 40 MG PO TABS: 40.0000 mg | ORAL_TABLET | Freq: Every day | ORAL | 0 refills | Status: AC

## 2017-06-06 MED ORDER — CARVEDILOL 12.5 MG PO TABS: 12.5000 mg | ORAL_TABLET | Freq: Two times a day (BID) | ORAL | 0 refills | Status: AC

## 2017-06-06 MED ORDER — GLUCERNA SHAKE PO LIQD: 237.0000 mL | Freq: Three times a day (TID) | ORAL | 0 refills | Status: AC

## 2017-06-06 MED ORDER — DEXTROSE 50 % IV SOLN
INTRAVENOUS | Status: AC
  Administered 2017-06-06: 08:00:00
  Filled 2017-06-06: qty 50

## 2017-06-06 NOTE — Clinical Social Work Placement (Signed)
   CLINICAL SOCIAL WORK PLACEMENT  NOTE  Date:  06/06/2017  Patient Details  Name: [name] MRN: [id number] Date of Birth: 1934/10/20  Clinical Social Work is seeking post-discharge placement for this patient at the [place] level of care (*CSW will initial, date and re-position this form in  chart as items are completed):  Yes   Patient/family provided with [facility] Work Department's list of facilities offering this level of care within the geographic area requested by the patient (or if unable, by the patient's family).  Yes   Patient/family informed of their freedom to choose among providers that offer the needed level of care, that participate in Medicare, Medicaid or managed care program needed by the patient, have an available bed and are willing to accept the patient.  Yes   Patient/family informed of [facility]'s ownership interest in [facility] and [facility], as well as of the fact that they are under no obligation to receive care at these facilities.  PASRR submitted to EDS on       PASRR number received on       Existing PASRR number confirmed on 06/06/17     FL2 transmitted to all facilities in geographic area requested by pt/family on 06/06/17     FL2 transmitted to all facilities within larger geographic area on       Patient informed that his/her managed care company has contracts with or will negotiate with certain facilities, including the following:  [facility]     Yes   Patient/family informed of bed offers received.  Patient chooses bed at [facility]     Physician recommends and patient chooses bed at      Patient to be transferred to [facility] on 06/06/17.  Patient to be transferred to facility by PTAR     Patient family notified on 06/06/17 of transfer.  Name of family member notified:  Deneen Harts, daughter     PHYSICIAN Please prepare  prescriptions     Additional Comment:    _______________________________________________ Estanislado Emms, LCSW 06/06/2017, 12:36 PM

## 2017-06-06 NOTE — Progress Notes (Addendum)
Patient will discharge to [facility] Anticipated discharge date: 06/06/17  Family notified: Deneen Harts, daughter Transportation by: [facility]  Nurse to call report to [phone]. Patient will go to room 609 at the facility.   CSW signing off.  Estanislado Emms, [place]  Clinical Social Worker

## 2017-06-06 NOTE — Progress Notes (Signed)
PT Cancellation Note  Patient Details Name: ADELY FACER MRN: [id number] DOB: 04-03-1935   Cancelled Treatment:    Reason Eval/Treat Not Completed: PT screened, no needs identified, will sign off.  Per SW, already schedule to d/c back to long-term SNF.  Will not see today. 06/06/2017  Donnella Sham, PT [phone]  (pager)   Tessie Fass Idona Stach 06/06/2017, 5:40 PM

## 2017-06-06 NOTE — Plan of Care (Signed)
Pt is pleasantly confused but does know that she is in [facility] and she knows her name and birthdate but needs reoriented to situation and time and place, VSS and she denies pain at this time. Pt has a bed alarm on but doesn't have much movement to bilateral legs which is not a new problem, will continue to monitor.

## 2017-06-06 NOTE — Progress Notes (Signed)
Progress Note  Patient Name: [name] Date of Encounter: 06/06/2017  Primary Cardiologist: Minus Breeding, MD   Subjective   Currently CP free. No dyspnea. Pt states she resides in a nursing facility. Has been there for 3 months and plans to stay. Does not ambulate on her own. Primarily uses a wheelchair.    Inpatient Medications    Scheduled Meds: . dextrose      . acetic acid   Irrigation Daily  . aspirin  81 mg Oral Daily  . B-complex with vitamin C  1 tablet Oral Daily  . carvedilol  12.5 mg Oral BID WC  . cloNIDine  0.1 mg Oral BID  . darifenacin  7.5 mg Oral Daily  . docusate sodium  100 mg Oral Q12H  . enoxaparin (LOVENOX) injection  30 mg Subcutaneous Q24H  . escitalopram  5 mg Oral Daily  . feeding supplement (GLUCERNA SHAKE)  237 mL Oral BID BM  . furosemide  40 mg Oral Daily  . gabapentin  300 mg Oral TID  . insulin aspart  0-15 Units Subcutaneous Q4H  . insulin detemir  20 Units Subcutaneous QHS  . levothyroxine  50 mcg Oral QAC breakfast  . loratadine  10 mg Oral Daily  . multivitamin  1 tablet Oral QHS  . rosuvastatin  20 mg Oral QPM  . sodium chloride flush  3 mL Intravenous Q12H   Continuous Infusions: . sodium chloride    . cefTRIAXone (ROCEPHIN)  IV Stopped (06/05/17 2039)   PRN Meds: sodium chloride, acetaminophen, alum & mag hydroxide-simeth, bisacodyl, HYDROcodone-acetaminophen, nitroGLYCERIN, ondansetron (ZOFRAN) IV, polyethylene glycol, sodium chloride flush   Vital Signs    Vitals:   06/05/17 1651 06/05/17 2012 06/05/17 2123 06/06/17 0416  BP: (!) 136/44 (!) 134/47 (!) 134/47 (!) 135/35  Pulse: (!) 55 (!) 58  62  Resp:  18  18  Temp: 97.8 F (36.6 C) 97.8 F (36.6 C)  99 F (37.2 C)  TempSrc: Oral Oral  Oral  SpO2: 92% 93%  92%  Weight:    154 lb 8.7 oz (70.1 kg)  Height:        Intake/Output Summary (Last 24 hours) at 06/06/2017 0859 Last data filed at 06/06/2017 0857 Gross per 24 hour  Intake 650 ml  Output 650 ml  Net 0  ml   Filed Weights   06/04/17 0428 06/05/17 0418 06/06/17 0416  Weight: 157 lb 14.4 oz (71.6 kg) 157 lb (71.2 kg) 154 lb 8.7 oz (70.1 kg)    Telemetry    NSR - Personally Reviewed  ECG    SR with 1st degree AVB chronic RBBB - Personally Reviewed  Physical Exam   GEN: No acute distress.   Neck: No JVD Cardiac: RRR, no murmurs, rubs, or gallops.  Respiratory: Clear to auscultation bilaterally. GI: Soft, nontender, non-distended  MS: No edema; No deformity. Neuro:  Nonfocal  Psych: Normal affect   Labs    Chemistry Recent Labs  Lab 06/02/17 2224 06/03/17 1010 06/04/17 0639  NA 135 136 138  K 4.6 4.5 4.3  CL 103 105 104  CO2 18* 20* 22  GLUCOSE 340* 164* 98  BUN 45* 43* 44*  CREATININE 1.40* 1.36* 1.52*  CALCIUM 9.2 9.2 9.3  PROT 6.3*  --   --   ALBUMIN 2.5*  --  2.3*  AST 25  --   --   ALT 17  --   --   ALKPHOS 82  --   --  BILITOT 0.6  --   --   GFRNONAA 34* 36* 31*  GFRAA 40* 41* 36*  ANIONGAP 14 11 12      Hematology Recent Labs  Lab 06/02/17 2224 06/03/17 1010 06/04/17 0639  WBC 13.1* 11.1* 9.1  RBC 3.59* 3.68* 3.61*  HGB 10.2* 10.2* 10.0*  HCT 32.2* 33.0* 32.5*  MCV 89.7 89.7 90.0  MCH 28.4 27.7 27.7  MCHC 31.7 30.9 30.8  RDW 13.3 13.4 13.5  PLT 276 272 326    Cardiac Enzymes Recent Labs  Lab 06/02/17 2224 06/03/17 0146 06/03/17 1010 06/03/17 1312  TROPONINI <0.03 <0.03 <0.03 <0.03    Recent Labs  Lab 06/02/17 2229  TROPIPOC 0.01     BNP Recent Labs  Lab 06/02/17 2224  BNP 301.1*     DDimer No results for input(s): DDIMER in the last 168 hours.   Radiology    Nm Myocar Multi W/spect W/wall Motion / Ef  Result Date: 06/05/2017 CLINICAL DATA:  Acute coronary syndrome. EXAM: MYOCARDIAL IMAGING WITH SPECT (REST AND PHARMACOLOGIC-STRESS) GATED LEFT VENTRICULAR WALL MOTION STUDY LEFT VENTRICULAR EJECTION FRACTION TECHNIQUE: Standard myocardial SPECT imaging was performed after resting intravenous injection of 10 mCi Tc-27m  tetrofosmin. Subsequently, intravenous infusion of Lexiscan was performed under the supervision of the Cardiology staff. At peak effect of the drug, 30 mCi Tc-5m tetrofosmin was injected intravenously and standard myocardial SPECT imaging was performed. Quantitative gated imaging was also performed to evaluate left ventricular wall motion, and estimate left ventricular ejection fraction. COMPARISON:  None. FINDINGS: Perfusion: There is a fixed defects noted in the inferolateral wall compatible with old infarct/scar. No significant reversibility to suggest inducible ischemia. Wall Motion: Slight loss of wall motion and thickening in the area of scar. Otherwise unremarkable. Left Ventricular Ejection Fraction: 49 % End diastolic volume 66 ml End systolic volume 34 ml IMPRESSION: 1. 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 No ischemia. 2. Slight decreased wall motion in the area of scar. 3. Left ventricular ejection fraction 49% 4. Non invasive risk stratification*: Intermediate *2012 Appropriate Use Criteria for Coronary Revascularization Focused Update: J Am Coll Cardiol. 6712;45(8):099-833. http://content.airportbarriers.com.aspx?articleid=[email or web address] Electronically Signed   By: Rolm Baptise M.D.   On: 06/05/2017 14:51    Cardiac Studies   2D Echo 06/05/17 Study Conclusions  - Left ventricle: The cavity size was normal. Wall thickness was   increased in a pattern of mild LVH. Systolic function was normal.   The estimated ejection fraction was in the range of 55% to 60%.   Wall motion was normal; there were no regional wall motion   abnormalities. Left ventricular diastolic function parameters   were normal. - Mitral valve: Valve area by pressure half-time: 1.82 cm^2. - Left atrium: The atrium was mildly dilated. - Atrial septum: No defect or patent foramen ovale was identified.   Lexiscan NST 06/05/17  FINDINGS: Perfusion: There is a fixed defects noted in the  inferolateral wall compatible with old infarct/scar. No significant reversibility to suggest inducible ischemia.  Wall Motion: Slight loss of wall motion and thickening in the area of scar. Otherwise unremarkable.  Left Ventricular Ejection Fraction: 49 %  End diastolic volume 66 ml  End systolic volume 34 ml  IMPRESSION: 1. 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 No ischemia.  2. Slight decreased wall motion in the area of scar.  3. Left ventricular ejection fraction 49%  4. Non invasive risk stratification*: Intermediate  Patient Profile  83 y.o. female without prior cardiac history.  We are asked to see in consultation for evaluation of chest pain.   Assessment & Plan    1. Chest Pain: Troponins negative x 3. Echo with normal LVEF and wall motion. NST intermediate risk with moderate to large area of old infarct in the inferolateral wall. No ischemia. EF by nuclear study 49% (55-60% by echo). Pt is currently CP free. Given overall functional status (permanent nursing home resident, primarily uses wheelchair to get around) and no ischemia on nuclear study, would opt on medical management. No plans for cath.  Continue ASA, statin and BB.   2. HTN: controlled on current regimen.   3. RBBB: chronic  4. Chronic Diastolic HF: EF remains preserved on echo. I/os net negative 1L. No edema. No dyspnea.  Continue PO Lasix.    For questions or updates, please contact [place] Please consult [email or web address] for contact info under Cardiology/STEMI.      Signed, Lyda Jester, PA-C  06/06/2017, 8:59 AM    History and all data above reviewed.  Patient examined.  I agree with the findings as above. She denies any chest pain or SOB.   The patient exam reveals COR:RRR  ,  Lungs: Clear  ,  Abd: Positive bowel sounds, no rebound no guarding, Ext No edema  .  All available labs, radiology testing, previous records reviewed. Agree with documented  assessment and plan. Chest pain:  Results as above.  No further work up .  Continue with medical management.    Minus Breeding  9:49 AM  06/06/2017

## 2017-06-06 NOTE — NC FL2 (Signed)
[place] LEVEL OF CARE SCREENING TOOL     IDENTIFICATION  Patient Name: [name] Birthdate: 07/30/1934 Sex: female Admission Date (Current Location): 06/02/2017  [facility] and [place] Number:  Herbalist and Address:  [facility], [place] [address] [place], [place] [address]      Provider Number: [id number]  Attending Physician Name and Address:  Bonnell Public, MD  Relative Name and Phone Number:  Deneen Harts, daughter, [phone]    Current Level of Care: Hospital Recommended Level of Care: [facility] [place] Prior Approval Number:    Date Approved/Denied:   PASRR Number: [id number]  Discharge Plan: SNF    Current Diagnoses: Patient Active Problem List   Diagnosis Date Noted  . Pressure injury of skin 06/04/2017  . Chest pain 06/03/2017  . Chronic diastolic CHF (congestive heart failure) ([place])   . Neck muscle spasm 06/01/2015  . CHF exacerbation ([place]) 10/09/2014  . Near syncope 10/05/2014  . Acute on chronic renal failure ([place]) 10/05/2014  . Tremor of both hands 10/05/2014  . Bradycardia 10/05/2014  . Wax in ear 10/05/2014  . Left ankle strain 08/09/2014  . Protein-calorie malnutrition, severe ([place]) 10/13/2013  . UTI (urinary tract infection) 10/11/2013  . Nausea, vomiting and abdominal pain. 10/11/2013  . Nausea and vomiting 10/11/2013  . TIA (transient ischemic attack) 10/07/2013  . Ulcer of left heel ([place]) 10/07/2013  . Altered mental status 06/20/2013  . Acute encephalopathy 06/20/2013  . Hypoglycemia 06/20/2013  . Renal failure (ARF), acute on chronic (HCC) 06/20/2013  . UTI (lower urinary tract infection) 06/20/2013  . ARF (acute renal failure) ([place]) 10/31/2012  . Hyperkalemia 10/31/2012  . Weakness generalized 10/31/2012  . CKD (chronic kidney disease) stage 3, GFR 30-59 ml/min (HCC) 10/31/2012  . Fall 10/31/2012  . Nasal laceration 10/31/2012  . Diastolic dysfunction, grade II  01/27/2012  . Acute exacerbation of chronic obstructive pulmonary disease (COPD) ([place]) 01/26/2012  . Hypoxia 01/25/2012  . Elevated serum creatinine 01/25/2012  . Pyelonephritis 01/23/2012  . Lumbago 01/23/2012  . Hypothyroidism 12/01/2008  . Diabetes mellitus without complication ([place]) 54/65/6812  . HYPERCHOLESTEROLEMIA 12/01/2008  . Normocytic anemia 12/01/2008  . Essential hypertension 12/01/2008  . CAD (coronary artery disease) 12/01/2008  . COPD (chronic obstructive pulmonary disease) ([place]) 12/01/2008  . WEAKNESS 12/01/2008  . EDEMA 12/01/2008  . SHORTNESS OF BREATH 12/01/2008  . DYSPHAGIA UNSPECIFIED 12/01/2008    Orientation RESPIRATION BLADDER Height & Weight     Self, Time, Place  Normal Indwelling catheter Weight: 154 lb 8.7 oz (70.1 kg) Height:  5\' 3"  (160 cm)  BEHAVIORAL SYMPTOMS/MOOD NEUROLOGICAL BOWEL NUTRITION STATUS        Diet(please see DC summary)  AMBULATORY STATUS COMMUNICATION OF NEEDS Skin   (baseline) Verbally PU Stage and Appropriate Care(PU buttocks, foam dressing)                       Personal Care Assistance Level of Assistance  Bathing, Feeding, Dressing(baseline)           Functional Limitations Info  Sight, Hearing, Speech Sight Info: Adequate Hearing Info: Adequate Speech Info: Adequate    SPECIAL CARE FACTORS FREQUENCY                       Contractures Contractures Info: Not present    Additional Factors Info  Code Status, Allergies, Psychotropic, Insulin Sliding Scale Code Status Info: DNR Allergies Info: Ace Inhibitors,  Codeine, Lisinopril, Norco Hydrocodone-acetaminophen, Penicillins, Trimethoprim, Septra Sulfamethoxazole-trimethoprim, Sulfur Psychotropic Info: clonidine, lexapro Insulin Sliding Scale Info: insulin Q4 hrs and at bedtime       Current Medications (06/06/2017):  This is the current hospital active medication list Current Facility-Administered Medications  Medication Dose Route Frequency  Provider Last Rate Last Dose  . 0.9 %  sodium chloride infusion  250 mL Intravenous PRN Opyd, Ilene Qua, MD      . acetaminophen (TYLENOL) tablet 650 mg  650 mg Oral Q4H PRN Opyd, Ilene Qua, MD      . acetic acid 0.25 % irrigation   Irrigation Daily Opyd, Ilene Qua, MD      . alum & mag hydroxide-simeth (MAALOX/MYLANTA) 200-200-20 MG/5ML suspension 30 mL  30 mL Oral Q6H PRN Opyd, Ilene Qua, MD      . aspirin chewable tablet 81 mg  81 mg Oral Daily Opyd, Ilene Qua, MD   81 mg at 06/06/17 0901  . B-complex with vitamin C tablet 1 tablet  1 tablet Oral Daily Opyd, Ilene Qua, MD   1 tablet at 06/06/17 0900  . bisacodyl (DULCOLAX) suppository 10 mg  10 mg Rectal Daily PRN Opyd, Ilene Qua, MD      . carvedilol (COREG) tablet 12.5 mg  12.5 mg Oral BID WC Dana Allan I, MD   12.5 mg at 06/06/17 0900  . cefTRIAXone (ROCEPHIN) 1 g in dextrose 5 % 50 mL IVPB  1 g Intravenous Q24H Bonnell Public, MD   Stopped at 06/05/17 2039  . cloNIDine (CATAPRES) tablet 0.1 mg  0.1 mg Oral BID Opyd, Ilene Qua, MD   0.1 mg at 06/06/17 0901  . darifenacin (ENABLEX) 24 hr tablet 7.5 mg  7.5 mg Oral Daily Opyd, Ilene Qua, MD   7.5 mg at 06/06/17 0911  . docusate sodium (COLACE) capsule 100 mg  100 mg Oral Q12H Opyd, Ilene Qua, MD   100 mg at 06/06/17 0901  . enoxaparin (LOVENOX) injection 30 mg  30 mg Subcutaneous Q24H Karren Cobble, RPH   30 mg at 06/05/17 2123  . escitalopram (LEXAPRO) tablet 5 mg  5 mg Oral Daily Opyd, Ilene Qua, MD   5 mg at 06/06/17 0900  . feeding supplement (GLUCERNA SHAKE) (GLUCERNA SHAKE) liquid 237 mL  237 mL Oral BID BM Dana Allan I, MD      . furosemide (LASIX) tablet 40 mg  40 mg Oral Daily Minus Breeding, MD   40 mg at 06/06/17 0900  . gabapentin (NEURONTIN) capsule 300 mg  300 mg Oral TID Vianne Bulls, MD   300 mg at 06/06/17 0901  . HYDROcodone-acetaminophen (NORCO/VICODIN) 5-325 MG per tablet 1 tablet  1 tablet Oral Q6H PRN Opyd, Ilene Qua, MD   1 tablet at 06/05/17  2336  . insulin aspart (novoLOG) injection 0-15 Units  0-15 Units Subcutaneous Q4H Opyd, Ilene Qua, MD   3 Units at 06/06/17 0524  . insulin detemir (LEVEMIR) injection 20 Units  20 Units Subcutaneous QHS Vianne Bulls, MD   20 Units at 06/05/17 2126  . levothyroxine (SYNTHROID, LEVOTHROID) tablet 50 mcg  50 mcg Oral QAC breakfast Opyd, Ilene Qua, MD   50 mcg at 06/06/17 0900  . loratadine (CLARITIN) tablet 10 mg  10 mg Oral Daily Opyd, Ilene Qua, MD   10 mg at 06/06/17 0901  . multivitamin (PROSIGHT) tablet 1 tablet  1 tablet Oral QHS Opyd, Ilene Qua, MD   1 tablet at 06/05/17 2123  .  nitroGLYCERIN (NITROSTAT) SL tablet 0.4 mg  0.4 mg Sublingual Q5 Min x 3 PRN Opyd, Ilene Qua, MD      . ondansetron (ZOFRAN) injection 4 mg  4 mg Intravenous Q6H PRN Opyd, Ilene Qua, MD      . polyethylene glycol (MIRALAX / GLYCOLAX) packet 17 g  17 g Oral Daily PRN Opyd, Ilene Qua, MD      . rosuvastatin (CRESTOR) tablet 20 mg  20 mg Oral QPM Opyd, Ilene Qua, MD   20 mg at 06/05/17 1711  . sodium chloride flush (NS) 0.9 % injection 3 mL  3 mL Intravenous Q12H Opyd, Ilene Qua, MD   3 mL at 06/05/17 2127  . sodium chloride flush (NS) 0.9 % injection 3 mL  3 mL Intravenous PRN Opyd, Ilene Qua, MD         Discharge Medications: Please see discharge summary for a list of discharge medications.  Relevant Imaging Results:  Relevant Lab Results:   Additional Information SSN: [id number]  Estanislado Emms, LCSW

## 2017-06-06 NOTE — Clinical Social Work Note (Signed)
Clinical Social Work Assessment  Patient Details  Name: [name] MRN: [id number] Date of Birth: 1934/12/15  Date of referral:  06/06/17               Reason for consult:  Facility Placement                Permission sought to share information with:  Facility Sport and exercise psychologist, Family Supports Permission granted to share information::  No  Name::     Deneen Harts  Agency::  [facility]  Relationship::  daughter  Contact Information:  [phone]  Housing/Transportation Living arrangements for the past 2 months:  [place] of Information:  Facility, Adult Children Patient Interpreter Needed:  None Criminal Activity/Legal Involvement Pertinent to Current Situation/Hospitalization:  No - Comment as needed Significant Relationships:  Adult Children Lives with:  Facility Resident Do you feel safe going back to the place where you live?  Yes Need for family participation in patient care:  Yes (Comment)  Care giving concerns: Patient is long term care resident at [facility]. Plan to return at discharge.   Social Worker assessment / plan: CSW spoke to patient's daughter, Butch Penny, via phone. SNF is requesting family come to sign patient back in to SNF when ready to return. Butch Penny indicated she can go do paperwork today at 1:30. Patient has lived at the facility for two years and is agreeable to return. CSW paged MD. CSW to support with discharge.  Employment status:  Retired Research officer, political party) PT Recommendations:  Not assessed at this time Information / Referral to community resources:  [place]  Patient/Family's Response to care: Family appreciative of care.  Patient/Family's Understanding of and Emotional Response to Diagnosis, Current Treatment, and Prognosis: Family with understanding of patient's condition and agreeable for return to SNF.  Emotional Assessment Appearance:  Appears stated  age Attitude/Demeanor/Rapport:  Unable to Assess Affect (typically observed):  Unable to Assess Orientation:  Oriented to Self, Oriented to Place, Oriented to  Time Alcohol / Substance use:  Not Applicable Psych involvement (Current and /or in the community):  No (Comment)  Discharge Needs  Concerns to be addressed:  Discharge Planning Concerns Readmission within the last 30 days:  No Current discharge risk:  Physical Impairment Barriers to Discharge:  Continued Medical Work up   Estanislado Emms, LCSW 06/06/2017, 10:04 AM

## 2017-06-06 NOTE — Care Management Note (Signed)
Case Management Note  Patient Details  Name: [name] MRN: [id number] Date of Birth: 1934-07-21  Subjective/Objective:  Pt presented for Chest Pain-PTA from [facility]. Plan to return once stable. CSW assisting with disposition needs.                   Action/Plan: CM will continue to monitor.   Expected Discharge Date:  06/06/17               Expected Discharge Plan:  Skilled Nursing Facility  In-House Referral:  Clinical Social Work  Discharge planning Services  CM Consult  Post Acute Care Choice:  NA Choice offered to:  NA  DME Arranged:  N/A DME Agency:  NA  HH Arranged:  NA HH Agency:  NA  Status of Service:  Completed, signed off  If discussed at [employer] of Stay Meetings, dates discussed:    Additional Comments:  [name], RN 06/06/2017, 10:50 AM

## 2017-06-06 NOTE — Progress Notes (Signed)
At [facility] Nurse Tech informed this RN that patient's blood sugar was 37.  Patient alert to self (at baseline), skin warm and dry.  1/2 amp D50 given.  Blood sugar rechecked shortly after and it was 97.  Patient with poor po intake, so Nurse Tech helped to feed her breakfast. Will continue to monitor.

## 2017-06-06 NOTE — Discharge Summary (Signed)
Physician Discharge Summary  Patient ID: [name] MRN: [id number] DOB/AGE: 08-31-1934 83 y.o.  Admit date: 06/02/2017 Discharge date: 06/06/2017  Admission Diagnoses:  Discharge Diagnoses:  Principal Problem:   Chest pain   Possible UTI (Follow repeat urine culture)   Hypoglycemia   Intermittent confusion at night time Active Problems:   Diabetes mellitus without complication (HCC)   Normocytic anemia   Essential hypertension   CAD (coronary artery disease)   COPD (chronic obstructive pulmonary disease) (HCC)   Diastolic dysfunction, grade II   CKD (chronic kidney disease) stage 3, GFR 30-59 ml/min (HCC)   Pressure injury of skin   Discharged Condition: stable  Hospital Course: Patient is an 83 year oldfemalewith past medical history significant forinsulin-dependent diabetes mellitus, hypertension, chronic kidney disease stage III, hypothyroidism, chronic diastolic CHF, and coronary artery disease. Patient was admitted with chest pain. Patient was admitted for further assessment and management. On presentation, the patient's troponin was 0.03, and the patient was initially started on Heparin drip while the troponin was trended. The troponin remained stable and the Heparin was discontinued. Cardiology was consulted and cardiac stress test was advised. Cardiac stress test revealed fixed defect with no reversibility to suggest inducible anemia. Patient has been by Cardiology for discharge [facility].  Urinalysis done during the hospital stay was suggestive of UTI. Urine was sent for culture, and patient was started on IV Rocephin. Patient will receive the third dose of IV Rocephin today. Unfortunately, the urine culture grew multiple organisms, and repeat urine culture is still pending. The PCP will kindly follow the result of repeat urine culture.  Patient was noted to be intermittently confused last night. Blood sugar was also low. Levemir wil be held, and the blood  sugar monitored closely. There will be need for calorie count and close monitoring of blood sugar at the [facility]. Patient has been optimized and will be discharged back to the [facility].  Consults: cardiology  Significant Diagnostic Studies: nuclear medicine: Cardiac stress test  Treatments: See above  Discharge Exam: Blood pressure (!) 135/35, pulse 62, temperature 99 F (37.2 C), temperature source Oral, resp. rate 18, height 5\' 3"  (1.6 m), weight 70.1 kg (154 lb 8.7 oz), SpO2 92 %.   Disposition: 03-Skilled Nursing Facility  Discharge Instructions    Call MD for:   Complete by:  As directed    Call MD for worsening of symptoms, low or high blood sugar   Diet - low sodium heart healthy   Complete by:  As directed    Diet Carb Modified   Complete by:  As directed    Discharge instructions   Complete by:  As directed    Check blood sugar every 4 hours for the first 24 hours and liaise with the SNF Physician. Calorie count for 3 days. Monitor blood sugar closely   Increase activity slowly   Complete by:  As directed      Allergies as of 06/06/2017      Reactions   Ace Inhibitors Other (See Comments)   Listed on MAR   Codeine Other (See Comments)   Listed on MAR   Lisinopril Other (See Comments)   Listed on MAR   Norco [hydrocodone-acetaminophen] Other (See Comments)   Listed on MAR   Penicillins Hives   Has patient had a PCN reaction causing immediate rash, facial/tongue/throat swelling, SOB or lightheadedness with hypotension: Yes Has patient had a PCN reaction causing severe rash involving mucus membranes or skin  necrosis: Unk Has patient had a PCN reaction that required hospitalization: Unk Has patient had a PCN reaction occurring within the last 10 years: Unk If all of the above answers are "NO", then may proceed with Cephalosporin use.   Trimethoprim Other (See Comments)   Listed on MAR   Septra [sulfamethoxazole-trimethoprim] Hives,  Rash   Sulfur Hives, Rash      Medication List    STOP taking these medications   alum & mag hydroxide-simeth 423-536-14 MG/5ML suspension Commonly known as:  MAALOX/MYLANTA   insulin detemir 100 UNIT/ML injection Commonly known as:  LEVEMIR   traMADol 50 MG tablet Commonly known as:  ULTRAM   UNABLE TO FIND     TAKE these medications   acetaminophen 325 MG tablet Commonly known as:  TYLENOL Take 650 mg by mouth daily as needed for mild pain. What changed:  Another medication with the same name was removed. Continue taking this medication, and follow the directions you see here.   acetic acid 0.25 % irrigation 30 ml's mixed with 30 ml's sterile water irrigate SP cath daily   amLODipine 10 MG tablet Commonly known as:  NORVASC Take 10 mg by mouth at bedtime.   aspirin 81 MG chewable tablet Chew 81 mg by mouth daily.   BIOFREEZE 4 % Gel Generic drug:  Menthol (Topical Analgesic) Apply 1 application topically See admin instructions. 1 application to lower back two times a day   bisacodyl 10 MG suppository Commonly known as:  DULCOLAX Place 10 mg rectally daily as needed for moderate constipation.   carvedilol 12.5 MG tablet Commonly known as:  COREG Take 1 tablet (12.5 mg total) by mouth 2 (two) times daily with a meal. What changed:    medication strength  how much to take   cloNIDine 0.1 MG tablet Commonly known as:  CATAPRES Take 0.1 mg by mouth 2 (two) times daily. Hold for SBP<110   Cranberry 250 MG Tabs Take 1,000 mg by mouth 2 (two) times daily.   docusate sodium 100 MG capsule Commonly known as:  COLACE Take 1 capsule (100 mg total) by mouth every 12 (twelve) hours.   escitalopram 5 MG tablet Commonly known as:  LEXAPRO Take 5 mg by mouth daily.   EYE VITAMINS & MINERALS Tabs Take 2 tablets by mouth daily.   50+ ADULT EYE HEALTH Caps Take 1 capsule by mouth at bedtime.   feeding supplement (GLUCERNA SHAKE) Liqd Take 237 mLs by mouth 3  (three) times daily with meals.   feeding supplement (PRO-STAT SUGAR FREE 64) Liqd Take 15 mLs by mouth 2 (two) times daily.   furosemide 40 MG tablet Commonly known as:  LASIX Take 1 tablet (40 mg total) by mouth daily. Start taking on:  06/07/2017 What changed:    medication strength  how much to take   FUSION PLUS Caps Take 1 capsule by mouth daily.   gabapentin 300 MG capsule Commonly known as:  NEURONTIN Take 300 mg by mouth 3 (three) times daily.   HYDROcodone-acetaminophen 5-325 MG tablet Commonly known as:  NORCO/VICODIN Take 1 tablet by mouth every 6 (six) hours as needed for moderate pain or severe pain. What changed:  when to take this   insulin aspart 100 UNIT/ML injection Commonly known as:  novoLOG Inject 0-9 Units into the skin 3 (three) times daily with meals. Sliding scale CBG 70 - 120: 0 units CBG 121 - 150: 1 unit,  CBG 151 - 200: 2 units,  CBG 201 -  250: 3 units,  CBG 251 - 300: 5 units,  CBG 301 - 350: 7 units,  CBG 351 - 400: 9 units   CBG > 400: 9 units and notify your MD What changed:    how much to take  when to take this  additional instructions   levothyroxine 50 MCG tablet Commonly known as:  SYNTHROID, LEVOTHROID Take 50 mcg by mouth daily before breakfast.   loratadine 10 MG tablet Commonly known as:  CLARITIN Take 10 mg by mouth daily.   ondansetron 4 MG tablet Commonly known as:  ZOFRAN Take 4 mg by mouth every 8 (eight) hours as needed for nausea or vomiting.   pantoprazole 40 MG tablet Commonly known as:  PROTONIX Take 1 tablet (40 mg total) by mouth daily.   polyethylene glycol packet Commonly known as:  MIRALAX / GLYCOLAX Take 17 g by mouth daily as needed for mild constipation, moderate constipation or severe constipation. Mix in 8 oz liquid and drink What changed:    reasons to take this  additional instructions   potassium chloride 10 MEQ tablet Commonly known as:  K-DUR,KLOR-CON Take 10 mEq by mouth daily.    rosuvastatin 20 MG tablet Commonly known as:  CRESTOR Take 20 mg by mouth every evening.   TRIPLE ANTIBIOTIC EX Apply 1 application topically See admin instructions. 1 application to left shin daily   Trospium Chloride 60 MG Cp24 Take 60 mg by mouth daily.   VITA-BEE/C Tabs Take 1 tablet by mouth daily.   Vitamin D (Ergocalciferol) 50000 units Caps capsule Commonly known as:  DRISDOL Take 50,000 Units by mouth every Friday.      Contact information for after-discharge care    Destination    [facility] .   Service:  Skilled Nursing Contact information: [address] [place] [phone]              Signed: Bonnell Public 06/06/2017, 10:43 AM

## 2017-06-06 NOTE — Progress Notes (Signed)
Patient to SNF via [facility].  Report called to receiving nurse.

## 2017-06-12 ENCOUNTER — Encounter: Payer: Self-pay | Admitting: Internal Medicine

## 2017-06-16 ENCOUNTER — Other Ambulatory Visit (HOSPITAL_COMMUNITY): Payer: Self-pay | Admitting: *Deleted

## 2017-06-19 ENCOUNTER — Ambulatory Visit (HOSPITAL_COMMUNITY)
Admission: RE | Admit: 2017-06-19 | Discharge: 2017-06-19 | Disposition: A | Discharge status: Home / Self Care | Payer: Medicare Other | Source: Ambulatory Visit | Attending: Nephrology | Admitting: Nephrology

## 2017-06-19 DIAGNOSIS — N189 Chronic kidney disease, unspecified: Secondary | ICD-10-CM | POA: Diagnosis not present

## 2017-06-19 DIAGNOSIS — D631 Anemia in chronic kidney disease: Secondary | ICD-10-CM | POA: Insufficient documentation

## 2017-06-19 MED ORDER — SODIUM CHLORIDE 0.9 % IV SOLN
510.0000 mg | INTRAVENOUS | Status: DC
  Administered 2017-06-19: 11:00:00 510 mg via INTRAVENOUS
  Filled 2017-06-19: qty 17

## 2017-06-19 NOTE — Discharge Instructions (Signed)

## 2017-06-26 ENCOUNTER — Ambulatory Visit (HOSPITAL_COMMUNITY): Admission: RE | Admit: 2017-06-26 | Payer: Medicare Other | Source: Ambulatory Visit

## 2017-08-14 DEATH — deceased
